# Patient Record
Sex: Female | Born: 1955 | Race: White | Hispanic: No | Smoking: Former smoker
Health system: Southern US, Community
[De-identification: ages and names within clinical notes are randomized; demographics above are authoritative.]

## PROBLEM LIST (undated history)

## (undated) DIAGNOSIS — R778 Other specified abnormalities of plasma proteins: Secondary | ICD-10-CM

## (undated) DIAGNOSIS — Z8619 Personal history of other infectious and parasitic diseases: Secondary | ICD-10-CM

## (undated) DIAGNOSIS — F17200 Nicotine dependence, unspecified, uncomplicated: Secondary | ICD-10-CM

## (undated) DIAGNOSIS — J45909 Unspecified asthma, uncomplicated: Secondary | ICD-10-CM

## (undated) DIAGNOSIS — F329 Major depressive disorder, single episode, unspecified: Secondary | ICD-10-CM

## (undated) DIAGNOSIS — T4145XA Adverse effect of unspecified anesthetic, initial encounter: Secondary | ICD-10-CM

## (undated) DIAGNOSIS — L03119 Cellulitis of unspecified part of limb: Secondary | ICD-10-CM

## (undated) DIAGNOSIS — I447 Left bundle-branch block, unspecified: Secondary | ICD-10-CM

## (undated) DIAGNOSIS — F32A Depression, unspecified: Secondary | ICD-10-CM

## (undated) DIAGNOSIS — M1990 Unspecified osteoarthritis, unspecified site: Secondary | ICD-10-CM

## (undated) DIAGNOSIS — F419 Anxiety disorder, unspecified: Secondary | ICD-10-CM

## (undated) DIAGNOSIS — R7989 Other specified abnormal findings of blood chemistry: Secondary | ICD-10-CM

## (undated) DIAGNOSIS — E079 Disorder of thyroid, unspecified: Secondary | ICD-10-CM

## (undated) DIAGNOSIS — M419 Scoliosis, unspecified: Secondary | ICD-10-CM

## (undated) DIAGNOSIS — Z87898 Personal history of other specified conditions: Secondary | ICD-10-CM

## (undated) DIAGNOSIS — T8859XA Other complications of anesthesia, initial encounter: Secondary | ICD-10-CM

## (undated) DIAGNOSIS — K589 Irritable bowel syndrome without diarrhea: Secondary | ICD-10-CM

## (undated) DIAGNOSIS — E0590 Thyrotoxicosis, unspecified without thyrotoxic crisis or storm: Secondary | ICD-10-CM

## (undated) DIAGNOSIS — O99280 Endocrine, nutritional and metabolic diseases complicating pregnancy, unspecified trimester: Secondary | ICD-10-CM

## (undated) HISTORY — PX: TUBAL LIGATION: SHX77

## (undated) HISTORY — DX: Nicotine dependence, unspecified, uncomplicated: F17.200

## (undated) HISTORY — DX: Unspecified asthma, uncomplicated: J45.909

## (undated) HISTORY — DX: Personal history of other infectious and parasitic diseases: Z86.19

## (undated) HISTORY — PX: OTHER SURGICAL HISTORY: SHX169

## (undated) HISTORY — DX: Depression, unspecified: F32.A

## (undated) HISTORY — DX: Unspecified osteoarthritis, unspecified site: M19.90

## (undated) HISTORY — DX: Other specified abnormal findings of blood chemistry: R79.89

## (undated) HISTORY — DX: Disorder of thyroid, unspecified: E07.9

## (undated) HISTORY — DX: Personal history of other specified conditions: Z87.898

## (undated) HISTORY — PX: WISDOM TOOTH EXTRACTION: SHX21

## (undated) HISTORY — DX: Left bundle-branch block, unspecified: I44.7

## (undated) HISTORY — DX: Anxiety disorder, unspecified: F41.9

## (undated) HISTORY — PX: KNEE SURGERY: SHX244

## (undated) HISTORY — DX: Major depressive disorder, single episode, unspecified: F32.9

## (undated) HISTORY — DX: Other specified abnormalities of plasma proteins: R77.8

## (undated) HISTORY — DX: Endocrine, nutritional and metabolic diseases complicating pregnancy, unspecified trimester: O99.280

---
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
## 1977-05-09 HISTORY — PX: BREAST BIOPSY: SHX20

## 1978-05-09 HISTORY — PX: BREAST BIOPSY: SHX20

## 1998-09-18 ENCOUNTER — Other Ambulatory Visit: Admission: RE | Admit: 1998-09-18 | Discharge: 1998-09-18 | Payer: Self-pay | Admitting: Internal Medicine

## 1998-10-06 ENCOUNTER — Ambulatory Visit (HOSPITAL_COMMUNITY): Admission: RE | Admit: 1998-10-06 | Discharge: 1998-10-06 | Payer: Self-pay | Admitting: Internal Medicine

## 1998-10-06 ENCOUNTER — Encounter: Payer: Self-pay | Admitting: Internal Medicine

## 1999-09-08 ENCOUNTER — Ambulatory Visit (HOSPITAL_BASED_OUTPATIENT_CLINIC_OR_DEPARTMENT_OTHER): Admission: RE | Admit: 1999-09-08 | Discharge: 1999-09-08 | Payer: Self-pay | Admitting: Orthopedic Surgery

## 1999-09-27 ENCOUNTER — Other Ambulatory Visit: Admission: RE | Admit: 1999-09-27 | Discharge: 1999-09-27 | Payer: Self-pay | Admitting: Internal Medicine

## 2000-09-26 ENCOUNTER — Other Ambulatory Visit: Admission: RE | Admit: 2000-09-26 | Discharge: 2000-09-26 | Payer: Self-pay | Admitting: Internal Medicine

## 2003-04-18 ENCOUNTER — Other Ambulatory Visit: Admission: RE | Admit: 2003-04-18 | Discharge: 2003-04-18 | Payer: Self-pay | Admitting: Internal Medicine

## 2005-01-25 ENCOUNTER — Ambulatory Visit: Payer: Self-pay | Admitting: Family Medicine

## 2006-06-14 ENCOUNTER — Ambulatory Visit: Payer: Self-pay | Admitting: Internal Medicine

## 2006-07-06 DIAGNOSIS — F17200 Nicotine dependence, unspecified, uncomplicated: Secondary | ICD-10-CM | POA: Insufficient documentation

## 2006-07-06 DIAGNOSIS — F339 Major depressive disorder, recurrent, unspecified: Secondary | ICD-10-CM | POA: Insufficient documentation

## 2006-07-06 DIAGNOSIS — M2550 Pain in unspecified joint: Secondary | ICD-10-CM | POA: Insufficient documentation

## 2006-07-06 HISTORY — DX: Nicotine dependence, unspecified, uncomplicated: F17.200

## 2006-11-08 ENCOUNTER — Ambulatory Visit: Payer: Self-pay | Admitting: Family Medicine

## 2006-11-08 ENCOUNTER — Telehealth: Payer: Self-pay | Admitting: *Deleted

## 2008-02-07 ENCOUNTER — Telehealth (INDEPENDENT_AMBULATORY_CARE_PROVIDER_SITE_OTHER): Payer: Self-pay | Admitting: Family Medicine

## 2008-03-05 ENCOUNTER — Telehealth (INDEPENDENT_AMBULATORY_CARE_PROVIDER_SITE_OTHER): Payer: Self-pay | Admitting: Family Medicine

## 2008-03-17 ENCOUNTER — Telehealth (INDEPENDENT_AMBULATORY_CARE_PROVIDER_SITE_OTHER): Payer: Self-pay | Admitting: Family Medicine

## 2008-04-24 ENCOUNTER — Telehealth (INDEPENDENT_AMBULATORY_CARE_PROVIDER_SITE_OTHER): Payer: Self-pay | Admitting: Family Medicine

## 2008-06-06 ENCOUNTER — Telehealth: Payer: Self-pay | Admitting: *Deleted

## 2008-06-18 ENCOUNTER — Ambulatory Visit: Payer: Self-pay | Admitting: Family Medicine

## 2008-06-18 DIAGNOSIS — F4321 Adjustment disorder with depressed mood: Secondary | ICD-10-CM | POA: Insufficient documentation

## 2008-06-18 DIAGNOSIS — F411 Generalized anxiety disorder: Secondary | ICD-10-CM | POA: Insufficient documentation

## 2008-12-09 ENCOUNTER — Encounter: Payer: Self-pay | Admitting: Family Medicine

## 2010-06-09 ENCOUNTER — Encounter: Payer: Self-pay | Admitting: *Deleted

## 2010-06-29 ENCOUNTER — Telehealth: Payer: Self-pay | Admitting: Internal Medicine

## 2010-06-29 NOTE — Telephone Encounter (Signed)
Ok to do this  But no until April if possible because of epic transition

## 2010-06-29 NOTE — Telephone Encounter (Signed)
I rcvd a call from Valerie Love.  She said that she used to be a pt of yours 3 yrs ago and would like to re-est with you as pcp. Pt has [facility].  Pt is req to come in for cpx. Pls advise. [phone].

## 2010-06-30 NOTE — Telephone Encounter (Signed)
I called pt and sch her for re-est 30 min office visit with Dr Fabian Sharp on 08/18/10 at 11am. Pt coming in fasting for cpx.

## 2010-08-18 ENCOUNTER — Ambulatory Visit (INDEPENDENT_AMBULATORY_CARE_PROVIDER_SITE_OTHER): Payer: BLUE CROSS/BLUE SHIELD | Admitting: Internal Medicine

## 2010-08-18 ENCOUNTER — Encounter: Payer: Self-pay | Admitting: Internal Medicine

## 2010-08-18 ENCOUNTER — Other Ambulatory Visit (HOSPITAL_COMMUNITY)
Admission: RE | Admit: 2010-08-18 | Discharge: 2010-08-18 | Disposition: A | Discharge status: Home / Self Care | Payer: BC Managed Care – PPO | Source: Ambulatory Visit | Attending: Internal Medicine | Admitting: Internal Medicine

## 2010-08-18 VITALS — BP 100/70 | HR 72 | Ht 61.75 in | Wt 140.0 lb

## 2010-08-18 DIAGNOSIS — M1990 Unspecified osteoarthritis, unspecified site: Secondary | ICD-10-CM

## 2010-08-18 DIAGNOSIS — Z01419 Encounter for gynecological examination (general) (routine) without abnormal findings: Secondary | ICD-10-CM

## 2010-08-18 DIAGNOSIS — R42 Dizziness and giddiness: Secondary | ICD-10-CM

## 2010-08-18 DIAGNOSIS — N760 Acute vaginitis: Secondary | ICD-10-CM

## 2010-08-18 DIAGNOSIS — R002 Palpitations: Secondary | ICD-10-CM

## 2010-08-18 DIAGNOSIS — M129 Arthropathy, unspecified: Secondary | ICD-10-CM

## 2010-08-18 DIAGNOSIS — Z113 Encounter for screening for infections with a predominantly sexual mode of transmission: Secondary | ICD-10-CM | POA: Insufficient documentation

## 2010-08-18 DIAGNOSIS — R9431 Abnormal electrocardiogram [ECG] [EKG]: Secondary | ICD-10-CM

## 2010-08-18 DIAGNOSIS — F4321 Adjustment disorder with depressed mood: Secondary | ICD-10-CM

## 2010-08-18 DIAGNOSIS — Z23 Encounter for immunization: Secondary | ICD-10-CM

## 2010-08-18 DIAGNOSIS — F17200 Nicotine dependence, unspecified, uncomplicated: Secondary | ICD-10-CM

## 2010-08-18 DIAGNOSIS — F339 Major depressive disorder, recurrent, unspecified: Secondary | ICD-10-CM

## 2010-08-18 DIAGNOSIS — Z0000 Encounter for general adult medical examination without abnormal findings: Secondary | ICD-10-CM

## 2010-08-18 LAB — CBC WITH DIFFERENTIAL/PLATELET
Basophils Absolute: 0 10*3/uL (ref 0.0–0.1)
Basophils Relative: 0.4 % (ref 0.0–3.0)
Eosinophils Absolute: 0.1 10*3/uL (ref 0.0–0.7)
Eosinophils Relative: 2.2 % (ref 0.0–5.0)
HCT: 41.5 % (ref 36.0–46.0)
Hemoglobin: 14 g/dL (ref 12.0–15.0)
Lymphocytes Relative: 28.7 % (ref 12.0–46.0)
Lymphs Abs: 1.8 10*3/uL (ref 0.7–4.0)
MCHC: 33.7 g/dL (ref 30.0–36.0)
MCV: 91.2 fl (ref 78.0–100.0)
Monocytes Absolute: 0.4 10*3/uL (ref 0.1–1.0)
Monocytes Relative: 6.9 % (ref 3.0–12.0)
Neutro Abs: 3.9 10*3/uL (ref 1.4–7.7)
Neutrophils Relative %: 61.8 % (ref 43.0–77.0)
Platelets: 258 10*3/uL (ref 150.0–400.0)
RBC: 4.55 Mil/uL (ref 3.87–5.11)
RDW: 13.6 % (ref 11.5–14.6)
WBC: 6.3 10*3/uL (ref 4.5–10.5)

## 2010-08-18 LAB — LIPID PANEL
Cholesterol: 184 mg/dL (ref 0–200)
HDL: 69.1 mg/dL (ref >39.00)
LDL Cholesterol: 108 mg/dL — ABNORMAL HIGH (ref 0–99)
Total CHOL/HDL Ratio: 3
Triglycerides: 37 mg/dL (ref 0.0–149.0)
VLDL: 7.4 mg/dL (ref 0.0–40.0)

## 2010-08-18 LAB — BASIC METABOLIC PANEL
BUN: 22 mg/dL (ref 6–23)
CO2: 29 mEq/L (ref 19–32)
Calcium: 9 mg/dL (ref 8.4–10.5)
Chloride: 107 mEq/L (ref 96–112)
Creatinine, Ser: 0.7 mg/dL (ref 0.4–1.2)
GFR: 97.11 mL/min (ref >60.00)
Glucose, Bld: 75 mg/dL (ref 70–99)
Potassium: 4.4 mEq/L (ref 3.5–5.1)
Sodium: 143 mEq/L (ref 135–145)

## 2010-08-18 LAB — POCT URINALYSIS DIPSTICK
Bilirubin, UA: NEGATIVE
Glucose, UA: NEGATIVE
Ketones, UA: NEGATIVE
Leukocytes, UA: NEGATIVE
Spec Grav, UA: 1.02

## 2010-08-18 LAB — HEPATIC FUNCTION PANEL
ALT: 16 U/L (ref 0–35)
Bilirubin, Direct: 0.1 mg/dL (ref 0.0–0.3)
Total Bilirubin: 0.8 mg/dL (ref 0.3–1.2)

## 2010-08-18 LAB — TSH: TSH: 0.53 u[IU]/mL (ref 0.35–5.50)

## 2010-08-18 LAB — SEDIMENTATION RATE: Sed Rate: 9 mm/hr (ref 0–22)

## 2010-08-18 LAB — T4, FREE: Free T4: 0.95 ng/dL (ref 0.60–1.60)

## 2010-08-18 LAB — T3, FREE: T3, Free: 2.8 pg/mL (ref 2.3–4.2)

## 2010-08-18 NOTE — Patient Instructions (Signed)
Someone will notify you about cardiology appointments we should do an echocardiogram and a appointment with the cardiologist about her abnormal EKG and racing heart. We will notify you about your lab reports. We should have you come back in about a month and overall your labs  and discuss all the other issues we talked about. In the meantime I would rejoin the hospice support groups and call if needed. You need to get a mammogram We can refer you for colonoscopy.

## 2010-08-18 NOTE — Progress Notes (Signed)
Subjective:    Patient ID: Valerie Love, female    DOB: [birth year], 55 y.o.   MRN: [id number]  HPI Patient comes in today to reestablish his last seen in 10/12/02. Hasn't seen a doctor since her husband died in 2007-10-12 from chronic cirrhosis and hepatitis C. She hasn't had insurance until recently .    She has a number of complaints today but would like to have a Pap smear done. She is postmenopausal. She has problems with episodic dizziness with racing heart but no syncope chest pain or shortness of breath she has some mood issues but is doing as best she can she has some arthritis and joint pains for which he takes Advil Aleve and allergies for which she takes over-the-counter Benadryl or other. She would like her thyroid checked she had a past history of thyroid dysfunction when she was pregnant. She is now only smoking less than half a pack per day but continues to smoke. No history of falling.  Review of Systems Allergic rhinitis  Headaches doesn't sleep well  Has some depressive sx since death of her husband  In group hospice . Not on meds was given valium in the past no meds  Or interventions currently not suicialal or hopeless.  Knees and hands hurt no injury. Is post menopausal.  No nvd blood on hemor when she wipes . Never had a colonoscopy.  Had evaluation with ekg in the past  For fast heart rate and was normal. . Tested neg Hep C  Screen hx of jaundice as a child.  Vaginal itching without dc  Used 1 day otc monistat and comes back. No exposures . Neg hearing vision changes except glasses for reading.  Rest of 14 system review as per hpi or negative     Objective:   Physical Exam    Physical Exam: Vital signs reviewed BJY:NWGN is a well-developed well-nourished alert cooperative  white female who appears her stated age in no acute distress.  HEENT: normocephalic  traumatic , Eyes: PERRL EOM's full, conjunctiva clear, Nares: paten,t no deformity discharge or tenderness., Ears: no deformity  EAC's clear TMs with normal landmarks. Mouth: clear OP, no lesions, edema.  Moist mucous membranes. Dentition in adequate repair. NECK: supple without masses, thyromegaly or bruits. CHEST/PULM:  Clear to auscultation and percussion breath sounds equal no wheeze , rales or rhonchi. No chest wall deformities or tenderness. Breast: normal by inspection . No dimpling, discharge, masses, tenderness or discharge . LN: no cervical axillary inguinal adenopathy. CV: PMI is nondisplaced, S1 S2 no gallops, murmurs, rubs. Peripheral pulses are full without delay.No JVD .  ABDOMEN: Bowel sounds normal nontender  No guard or rebound, no hepato splenomegal no CVA tenderness.  No hernia. Extremtities:  No clubbing cyanosis or edema, no acute joint swelling or redness no focal atrophy. There are some oa changes in her hands  But no redness  NEURO:  Oriented x3, cranial nerves 3-12 appear to be intact, no obvious focal weakness,gait within normal limits no abnormal reflexes or asymmetrical   Oriented x 3 and no noted deficits in memory, attention, and speech.  SKIN: No acute rashes normal turgor, color, no bruising or petechiae. PSYCH: Oriented, good eye contact, no obvious depression anxiety, cognition and judgment appear normal. Mildly down articulate  and good eye contact  Pelvic: NL ext GU, labia red with faded spotty redness no ulcers or warts  . INternal Vagina no lesions  Minimal discharge.Cervix: clear  UTERUS: Neg CMT Adnexa:  clear  no masses . PAP done  EKG  LBBB sinus rhythm.    Assessment & Plan:  Preventive Health Care Out of the [facility] system for years  And need to get her parameters ut including mammo gram and colonoscopy.  GYNE exam normal today pap done. Palpitations   Sounds like tachy sx.  Dizzy  Hx of thyroid prob in pregnancy  Recheck levelstoday. Abnormal ekg  Will need echo and Cardiology consults  Arthritis  Appears to be OA type by exam and hx  Smoker  Less but continues  Needs to stop    MOOD: appear to be reactive   Would avoid meds for now until cardiac pix evaluated and will discuss at fu.  Vaginitis  Check for yeast  Can do 7 day rx and call may add diflucan depending on results  Also avoid strong  irritants for now.

## 2010-08-19 ENCOUNTER — Encounter: Payer: Self-pay | Admitting: Internal Medicine

## 2010-08-19 DIAGNOSIS — Z0000 Encounter for general adult medical examination without abnormal findings: Secondary | ICD-10-CM | POA: Insufficient documentation

## 2010-08-19 DIAGNOSIS — Z01419 Encounter for gynecological examination (general) (routine) without abnormal findings: Secondary | ICD-10-CM | POA: Insufficient documentation

## 2010-08-19 DIAGNOSIS — N760 Acute vaginitis: Secondary | ICD-10-CM | POA: Insufficient documentation

## 2010-08-19 DIAGNOSIS — R9431 Abnormal electrocardiogram [ECG] [EKG]: Secondary | ICD-10-CM | POA: Insufficient documentation

## 2010-08-19 LAB — CYCLIC CITRUL PEPTIDE ANTIBODY, IGG: Cyclic Citrullin Peptide Ab: <2 U/mL (ref 0.0–5.0)

## 2010-08-19 LAB — ANA: Anti Nuclear Antibody(ANA): NEGATIVE

## 2010-08-19 NOTE — Assessment & Plan Note (Signed)
lbbb  Needs eval  Poss associated with palpitations and dizziness . Will plan for echo and cards consult

## 2010-08-25 ENCOUNTER — Encounter: Payer: Self-pay | Admitting: *Deleted

## 2010-08-27 ENCOUNTER — Encounter: Payer: Self-pay | Admitting: Internal Medicine

## 2010-09-01 ENCOUNTER — Other Ambulatory Visit (HOSPITAL_COMMUNITY): Payer: Self-pay | Admitting: Internal Medicine

## 2010-09-01 DIAGNOSIS — R9431 Abnormal electrocardiogram [ECG] [EKG]: Secondary | ICD-10-CM

## 2010-09-01 DIAGNOSIS — R000 Tachycardia, unspecified: Secondary | ICD-10-CM

## 2010-09-01 DIAGNOSIS — R42 Dizziness and giddiness: Secondary | ICD-10-CM

## 2010-09-01 DIAGNOSIS — R55 Syncope and collapse: Secondary | ICD-10-CM

## 2010-09-06 ENCOUNTER — Telehealth: Payer: Self-pay | Admitting: *Deleted

## 2010-09-06 MED ORDER — METRONIDAZOLE 500 MG PO TABS: 500.0000 mg | ORAL_TABLET | Freq: Two times a day (BID) | ORAL | Status: AC

## 2010-09-06 NOTE — Telephone Encounter (Signed)
Message copied by Tor Netters on Mon Sep 06, 2010 12:43 PM ------      Message from: [facility], [place] K      Created: Fri Sep 03, 2010  5:38 PM       Tell patient that her pap  Is neg for cancer.   There was however some yeast and bacteria consistent with Bacterial Vaginosis.            If she is still having any vaginal sx then recommend she can try OTC monistat for 3-7 days   For yeast and Flagyl 500 bid for 7 days for the BV.

## 2010-09-06 NOTE — Telephone Encounter (Signed)
Left message for pt to call back  °

## 2010-09-06 NOTE — Telephone Encounter (Signed)
Pt aware of results and rx sent electronically

## 2010-09-08 ENCOUNTER — Encounter: Payer: Self-pay | Admitting: Cardiology

## 2010-09-08 ENCOUNTER — Ambulatory Visit (INDEPENDENT_AMBULATORY_CARE_PROVIDER_SITE_OTHER): Payer: BC Managed Care – PPO | Admitting: Cardiology

## 2010-09-08 ENCOUNTER — Ambulatory Visit (HOSPITAL_COMMUNITY): Payer: BC Managed Care – PPO | Attending: Internal Medicine | Admitting: Radiology

## 2010-09-08 DIAGNOSIS — R9431 Abnormal electrocardiogram [ECG] [EKG]: Secondary | ICD-10-CM

## 2010-09-08 DIAGNOSIS — F17200 Nicotine dependence, unspecified, uncomplicated: Secondary | ICD-10-CM

## 2010-09-08 DIAGNOSIS — R42 Dizziness and giddiness: Secondary | ICD-10-CM

## 2010-09-08 DIAGNOSIS — R000 Tachycardia, unspecified: Secondary | ICD-10-CM

## 2010-09-08 DIAGNOSIS — R002 Palpitations: Secondary | ICD-10-CM

## 2010-09-08 DIAGNOSIS — R011 Cardiac murmur, unspecified: Secondary | ICD-10-CM | POA: Insufficient documentation

## 2010-09-08 DIAGNOSIS — I447 Left bundle-branch block, unspecified: Secondary | ICD-10-CM

## 2010-09-08 NOTE — Assessment & Plan Note (Signed)
Patient counseled on discontinuing. 

## 2010-09-08 NOTE — Assessment & Plan Note (Signed)
Symptoms are short-lived but recurrent. We discussed the options today. We will follow this and if they persist or worsen we will schedule a CardioNet monitor.

## 2010-09-08 NOTE — Patient Instructions (Signed)
Your physician recommends that you schedule a follow-up appointment in: 4-6 weeks with Dr. Jens Som Your physician has requested that you have a lexiscan myoview. For further information please visit [email or web address] Please follow instruction sheet, as given.

## 2010-09-08 NOTE — Assessment & Plan Note (Signed)
Patient noted to have a left bundle branch block on electrocardiogram. Proceed with Myoview to exclude ischemia.

## 2010-09-08 NOTE — Progress Notes (Signed)
HPI: 55 year old female with no prior cardiac history for evaluation of palpitations and abnormal electrocardiogram. Patient has had palpitations for years. They are sudden onset and described as a flutter. She has been more at night. They resolve spontaneously after one to 2 seconds. There is no associated presyncope or syncope. There is no associated chest pain. She also notices dizziness when going from a bending position to upright. She also notices this coming down a ladder. It is not associated with her palpitations. She has dyspnea with more extreme activities but not with routine activities. There is no orthopnea, PND, pedal edema or chest pain. She was recently also noted to have a left bundle branch block on electrocardiogram. Because of the above we were asked to further evaluate.  Current Outpatient Prescriptions  Medication Sig Dispense Refill  . metroNIDAZOLE (FLAGYL) 500 MG tablet Take 1 tablet (500 mg total) by mouth 2 (two) times daily.  14 tablet  0  . Naproxen Sodium (ALEVE) 220 MG CAPS Take by mouth as directed.       . NON FORMULARY Sudafed prn          Allergies  Allergen Reactions  . Codeine Itching, Swelling and Rash    Past Medical History  Diagnosis Date  . Arthritis   . Thyroid disease in pregnancy   . Childhood asthma   . Hx of varicella   . History of jaundice     as a child    . Depression     related to husband illness and death    Past Surgical History  Procedure Date  . Tubal ligation   . Breast biopsy 80  . Tonsillectomy and adenoidectomy 1073  . Knee surgery     right duda   . Cyst removed from ovary     History   Social History  . Marital Status: Married    Spouse Name: N/A    Number of Children: 3  . Years of Education: N/A   Occupational History  .      Catering and cleaning   Social History Main Topics  . Smoking status: Current Everyday Smoker -- 0.5 packs/day for 40 years  . Smokeless tobacco: Not on file  . Alcohol Use: Yes      socially  . Drug Use: No  . Sexually Active: Not on file   Other Topics Concern  . Not on file   Social History Narrative   Husband passed away on 02-16-08 hep c and cirrhosis.   She is hep c negative [id number] of 2 son no pets No falls .  Has smoke detector and wears seat belts. POsitive  firearms. No excess sun exposure. 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 Still smokes some. Daily. ocass etoh.  No rec drugs.Daughter patient in our practice   [name]    Family History  Problem Relation Age of Onset  . Alcohol abuse Mother     died from stomach ulcers  . Stroke Father     died  from cva and mi  . Hypertension      parent  . Hyperlipidemia      parent  . Coronary artery disease Father     Age 98    ROS: Leg Cramps and Fatigue but no fevers or chills, productive cough, hemoptysis, dysphasia, odynophagia, melena, hematochezia, dysuria, hematuria, rash, seizure activity, orthopnea, PND, pedal edema, claudication. Remaining systems are negative.  Physical Exam: General:  Well developed/well nourished in NAD Skin warm/dry Patient not depressed No peripheral clubbing Back-normal HEENT-normal/normal eyelids Neck supple/normal carotid upstroke bilaterally; no bruits; no JVD; no thyromegaly chest - CTA/ normal expansion CV - RRR/normal S1 and S2; no rubs or gallops;  PMI nondisplaced; 2/6 systolic murmur at the apex. Abdomen -NT/ND, no HSM, no mass, + bowel sounds, no bruit 2+ femoral pulses, no bruits Ext-no edema, chords, 2+ DP Neuro-grossly nonfocal  ECG 08/18/10 Sinus bradycardia with left bundle branch block.

## 2010-09-08 NOTE — Assessment & Plan Note (Signed)
MR murmur on examination. Schedule echocardiogram.

## 2010-09-15 ENCOUNTER — Ambulatory Visit (HOSPITAL_COMMUNITY): Payer: BC Managed Care – PPO | Attending: Cardiology | Admitting: Radiology

## 2010-09-15 DIAGNOSIS — R0602 Shortness of breath: Secondary | ICD-10-CM

## 2010-09-15 DIAGNOSIS — R079 Chest pain, unspecified: Secondary | ICD-10-CM

## 2010-09-15 DIAGNOSIS — I447 Left bundle-branch block, unspecified: Secondary | ICD-10-CM | POA: Insufficient documentation

## 2010-09-15 DIAGNOSIS — R002 Palpitations: Secondary | ICD-10-CM | POA: Insufficient documentation

## 2010-09-15 MED ORDER — TECHNETIUM TC 99M TETROFOSMIN IV KIT
11.0000 | PACK | Freq: Once | INTRAVENOUS | Status: AC | PRN
  Administered 2010-09-15: 11 via INTRAVENOUS

## 2010-09-15 MED ORDER — TECHNETIUM TC 99M TETROFOSMIN IV KIT
33.0000 | PACK | Freq: Once | INTRAVENOUS | Status: AC | PRN
  Administered 2010-09-15: 33 via INTRAVENOUS

## 2010-09-15 MED ORDER — ADENOSINE (DIAGNOSTIC) 3 MG/ML IV SOLN
0.5600 mg/kg | Freq: Once | INTRAVENOUS | Status: AC
  Administered 2010-09-15: 34.8 mg via INTRAVENOUS

## 2010-09-15 NOTE — Progress Notes (Signed)
[facility] SITE 3 NUCLEAR MED [address] [place] [address] [phone]  Cardiology Nuclear Med Study  Valerie Love is a 55 y.o. female [id number] May 11, 1955   Nuclear Med Background Indication for Stress Test:  Evaluation for Ischemia and Abnormal EKG History: 05/12 Echo: EF 50% Cardiac Risk Factors: Family History - CAD, LBBB and Smoker  Symptoms:  Dizziness, DOE and Palpitations   Nuclear Pre-Procedure Caffeine/Decaff Intake:  None NPO After: 10:00pm   Lungs:  clear IV 0.9% NS with Angio Cath:  22g  IV Site: R Antecubital  IV Started by:  Irean Hong, RN  Chest Size (in):  36 Cup Size: C  Height: 5\' 1"  (1.549 m)  Weight:  137 lb (62.143 kg)  BMI:  Body mass index is 25.89 kg/(m^2). Tech Comments:  N/A    Nuclear Med Study 1 or 2 day study: 1 day  Stress Test Type:  Adenosine  Reading MD: Marca Ancona, MD  Order Authorizing Provider:  B.Crenshaw  Resting Radionuclide: Technetium 55m Tetrofosmin  Resting Radionuclide Dose: 11.0 mCi   Stress Radionuclide:  Technetium 19m Tetrofosmin  Stress Radionuclide Dose: 33.0 mCi           Stress Protocol Rest HR: 60 Stress HR: 107  Rest BP: 104/68 Stress BP: 133/81  Exercise Time (min): n/a METS: n/a   Predicted Max HR: 165 bpm % Max HR: 64.85 bpm Rate Pressure Product: [id number]   Dose of Adenosine (mg):  34.9 Dose of Lexiscan: n/a mg  Dose of Atropine (mg): n/a Dose of Dobutamine: n/a mcg/kg/min (at max HR)  Stress Test Technologist: Milana Na, EMT-P  Nuclear Technologist:  Domenic Polite, CNMT     Rest Procedure:  Myocardial perfusion imaging was performed at rest 45 minutes following the intravenous administration of Technetium 47m Tetrofosmin. Rest ECG: NSR-LBBB  Stress Procedure:  The patient received IV adenosine at 140 mcg/kg/min for 4 minutes.  There were no significant changes and pvcs with infusion.  Technetium 9m Tetrofosmin was injected at the 2 minute mark and quantitative  spect images were obtained after a 45 minute delay. Stress ECG: No significant change from baseline ECG  QPS Raw Data Images:  Normal; no motion artifact; normal heart/lung ratio. Stress Images:  Mildly decreased counts in the septal wall.  Rest Images:  Mildly decreased counts in the septal wall.  Subtraction (SDS):  Fixed mild perfusion defect in the septal wall.  Transient Ischemic Dilatation (Normal <1.22):  1.18 Lung/Heart Ratio (Normal <0.45):  0.28  Quantitative Gated Spect Images QGS EDV:  128 ml QGS ESV:  66 ml QGS cine images:  Septal-lateral dyssynchrony QGS EF: 48%  Impression Exercise Capacity:  Adenosine study with no exercise. BP Response:  Normal Clinical Symptoms:  Neck and chest tightness.  ECG Impression:  LBBB, no change. Comparison with Prior Nuclear Study: No images to compare  Overall Impression:  Low risk stress nuclear study.  There is a mild, fixed septal perfusion defect that I suspect is secondary to LBBB.  No ischemia.  Dyssynchronous septal and lateral walls.   Reinhart Saulters [employer]

## 2010-09-16 NOTE — Progress Notes (Signed)
Copy routed to Dr. Crenshaw.Falecha Clark ° °

## 2010-09-17 ENCOUNTER — Ambulatory Visit: Payer: BLUE CROSS/BLUE SHIELD | Admitting: Internal Medicine

## 2010-09-17 NOTE — Progress Notes (Signed)
pt aware of results Valerie Love  

## 2010-09-24 ENCOUNTER — Encounter: Payer: Self-pay | Admitting: Internal Medicine

## 2010-09-24 ENCOUNTER — Ambulatory Visit (INDEPENDENT_AMBULATORY_CARE_PROVIDER_SITE_OTHER): Payer: BC Managed Care – PPO | Admitting: Internal Medicine

## 2010-09-24 VITALS — BP 120/80 | HR 77 | Wt 139.0 lb

## 2010-09-24 DIAGNOSIS — F411 Generalized anxiety disorder: Secondary | ICD-10-CM

## 2010-09-24 DIAGNOSIS — M1990 Unspecified osteoarthritis, unspecified site: Secondary | ICD-10-CM

## 2010-09-24 DIAGNOSIS — M129 Arthropathy, unspecified: Secondary | ICD-10-CM

## 2010-09-24 DIAGNOSIS — I447 Left bundle-branch block, unspecified: Secondary | ICD-10-CM

## 2010-09-24 DIAGNOSIS — R42 Dizziness and giddiness: Secondary | ICD-10-CM

## 2010-09-24 DIAGNOSIS — F17200 Nicotine dependence, unspecified, uncomplicated: Secondary | ICD-10-CM

## 2010-09-24 MED ORDER — ALPRAZOLAM 0.25 MG PO TABS: 0.2500 mg | ORAL_TABLET | Freq: Three times a day (TID) | ORAL | Status: DC | PRN

## 2010-09-24 NOTE — Patient Instructions (Signed)
Can  Add   Antianxiety agent  As needed.  Will  Get the ok from Dr Jens Som for adding controller med such as lexapro  To help with the mood.   Then will plan rov after 3 weeks or so on the medication. Continue lifestyle intervention healthy eating and exercise .   And stop tobacco.

## 2010-09-24 NOTE — Progress Notes (Signed)
  Subjective:    Patient ID: Valerie Love, female    DOB: 05/11/1955, 55 y.o.   MRN: [id number]  HPI Patient comes in for followup of a number of issues. Since her last visit she has seen cardiology although does not know her Holter results yet. She had a chemical stress test and an echocardiogram. Since that time she has Has decreased caffiene  In half and tobacco to 5 per day  Thinks some of issues of dizziness and feeling bad could be from anxiety  . She thinks she needs some help in this area.  ? Hx of remote rx with  prozac in the past but cant quite remember  .  Needs help with anxiety depression  Doesn't think more hospice  Help  Would be that helpful.   Working 2 jobs. Review of Systems Currently negative for chest pain vision pain. Taking Aleve once a day for joint aches. No change in meds. No bleeding. Is waiting on the colonoscopies because she will have to pay so much money for that 50%.  Past history family history social history reviewed in the electronic medical record.      Objective:   Physical Exam WDWN in nad Chest:  Clear to A&P without wheezes rales or rhonchi CV:  S1-S2 no gallops or murmurs peripheral perfusion is normal Abdomen:  Sof,t normal bowel sounds without hepatosplenomegaly, no guarding rebound or masses no CVA tenderness reviewed labs  with her     Assessment & Plan:  Dizziness episodes ;some better. Left bundle branch block; under evaluation.  Has made some major life style changes for the better and encouraged to continue Tobacco  Can try nic replacement Arthralgia No evidence of inflammatory arthritis at this time.  Probably osteoarthritis etc. Take care with Aleve once a day Anxiety depression; adding to the mix was good insight  Disc     Add benzo for now and then an ssri  With ok from cards.  Then plan follow up.   Expectant management.And risk benefit of medication discussed.

## 2010-09-24 NOTE — Op Note (Signed)
[place]. [facility]  Patient:    Valerie Love, Valerie Love                         MRN: [id number] Proc. Date: 09/08/99 Adm. Date:  [id number] Disc. Date: [id number] Attending:  Aldean Baker V                           Operative Report  PREOPERATIVE DIAGNOSIS:  Right knee lateral meniscal tear with synovial cyst.  POSTOPERATIVE DIAGNOSIS:  Right knee lateral meniscal tear with synovial cyst and osteochondral defect of the lateral femoral condyle.  PROCEDURE:  Right knee arthroscopy with debridement of synovial cyst, debridement of partial lateral meniscal tear, and debridement of lateral femoral condyle osteochondral defect.  SURGEON:  [name], M.D.  ANESTHESIA:  General endotracheal.  ESTIMATED BLOOD LOSS:  Minimal.  DRAINS:  None.  COMPLICATIONS:  None.  DISPOSITION:  To PACU in stable condition.  INDICATIONS FOR PROCEDURE:  The patient is a 55 year old woman with chronic right knee pain.  She has been having a cystic mass which has been increasing and decreasing in size, and has caused her pain.  Pain with activities of daily living. MRI showed a lateral meniscal tear and a synovial cyst.  After failing conservative care, the patient presents at this time for arthroscopic intervention.  The risks and benefits were discussed including infection, neurovascular injury, persistent pain, persistent swelling, recurrence of the cyst.  The patient states she understands and wishes to proceed at this time.  DESCRIPTION OF PROCEDURE:  The patient was brought to OR #5 and underwent a general anesthetic.  After an adequate level of anesthesia was obtained, the patients right lower extremity was prepped using DuraPrep and draped into a sterile field. The arthroscope was inserted through the inferolateral portion.  Visualization f the patellofemoral joint showed some synovial fold hypertrophy, showed no changes with the patellar or trochlea.   Examination of the medial femoral condyle showed a stable intact medial meniscus.  No osteochondral defects.  Examination of the notch showed a good stable ACL.  Examination of the lateral joint line showed a very small osteochondral defect which was debrided with the shaver from the medial portal.  Examination of the lateral joint line showed a small anterolateral tear of the lateral meniscus which was debrided.  The synovial cyst was also debrided intra-articularly.  Survey was then performed of all the joints.  No evidence of any loose bodies.  No other pathology identified.  The instruments were removed.  The joint was closed using a 2-0 nylon.  The wounds were covered with Adaptic, orthopedic sponges, ABD dressing, sterile Webril, and an Ace wrap from her toes to the thigh.  She was extubated and taken to PACU in stable condition.  Followup in the office in one to two weeks, weightbearing as tolerated with crutches. DD:  09/08/99 TD:  09/10/99 Job: [id number]

## 2010-10-05 ENCOUNTER — Telehealth: Payer: Self-pay | Admitting: *Deleted

## 2010-10-05 MED ORDER — ESCITALOPRAM OXALATE 10 MG PO TABS: ORAL_TABLET | ORAL | Status: DC

## 2010-10-05 NOTE — Telephone Encounter (Signed)
Left message to call back  

## 2010-10-05 NOTE — Telephone Encounter (Signed)
Message copied by Romualdo Bolk on Tue Oct 05, 2010  2:51 PM ------      Message from: [facility], [place] K      Created: Mon Oct 04, 2010  6:57 PM       Tell patient that got the ok from cardiology to try lexapro,            Begin lexapro 10 mg take 5 mg ( 1/2) po qd for 1 week and then increase to 10 mg per day ( whole pill)   Disp 30 refill x 1 .      Then ROV 3 weeks or so after beginning med.             WKpanosh

## 2010-10-05 NOTE — Telephone Encounter (Signed)
Pt aware and rx called in  

## 2010-10-05 NOTE — Telephone Encounter (Signed)
Left message on machine to call back  

## 2010-10-06 ENCOUNTER — Encounter: Payer: Self-pay | Admitting: Cardiology

## 2010-10-06 ENCOUNTER — Ambulatory Visit (INDEPENDENT_AMBULATORY_CARE_PROVIDER_SITE_OTHER): Payer: BC Managed Care – PPO | Admitting: Cardiology

## 2010-10-06 DIAGNOSIS — R002 Palpitations: Secondary | ICD-10-CM

## 2010-10-06 DIAGNOSIS — R9431 Abnormal electrocardiogram [ECG] [EKG]: Secondary | ICD-10-CM

## 2010-10-06 MED ORDER — METOPROLOL SUCCINATE ER 25 MG PO TB24: ORAL_TABLET | ORAL | Status: DC

## 2010-10-06 NOTE — Patient Instructions (Signed)
Your physician wants you to follow-up in: 12 months. You will receive a reminder letter in the mail two months in advance. If you don't receive a letter, please call our office to schedule the follow-up appointment. Your physician has recommended you make the following change in your medication: Start Toprol 12.5 mg by mouth daily.(this will be half of a 25 mg tablet)

## 2010-10-06 NOTE — Progress Notes (Signed)
HPI:55 year old female I saw in early May for evaluation of palpitations and abnormal electrocardiogram. Myoview 5/12 revealed EF 48 and fixed septal defect felt secondary to LBBB. Echocardiogram in May 2012 revealed an ejection fraction of 50%, mild left atrial enlargement, trace mitral and aortic insufficiency. Since I last saw her, she denies dyspnea on exertion, orthopnea, pedal edema, syncope or chest pain. She continues to have occasional palpitations described as a brief flutter. These are not sustained.   Current Outpatient Prescriptions  Medication Sig Dispense Refill  . ALPRAZolam (XANAX) 0.25 MG tablet Take 1 tablet (0.25 mg total) by mouth 3 (three) times daily as needed for anxiety.  30 tablet  0  . escitalopram (LEXAPRO) 10 MG tablet 1/2 tab for 1 week then 1 tab daily after that.  30 tablet  1  . Naproxen Sodium (ALEVE) 220 MG CAPS Take by mouth as directed.          Past Medical History  Diagnosis Date  . Arthritis   . Thyroid disease in pregnancy   . Childhood asthma   . Hx of varicella   . History of jaundice     as a child    . Depression     related to husband illness and death    Past Surgical History  Procedure Date  . Tubal ligation   . Breast biopsy 80  . Tonsillectomy and adenoidectomy 1073  . Knee surgery     right duda   . Cyst removed from ovary     History   Social History  . Marital Status: Married    Spouse Name: N/A    Number of Children: 3  . Years of Education: N/A   Occupational History  .      Catering and cleaning   Social History Main Topics  . Smoking status: Current Everyday Smoker -- 0.5 packs/day for 40 years  . Smokeless tobacco: Not on file  . Alcohol Use: Yes     socially  . Drug Use: No  . Sexually Active: Not on file   Other Topics Concern  . Not on file   Social History Narrative   Husband passed away on 03/11/2008 hep c and cirrhosis.   She is hep c negative 2006G3P3HH of 2 son no pets No falls .  Has smoke  detector and wears seat belts. POsitive  firearms. No excess sun exposure. 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 Still smokes some. Daily. ocass etoh.  No rec drugs.Daughter patient in our practice   [name]    ROS: no fevers or chills, productive cough, hemoptysis, dysphasia, odynophagia, melena, hematochezia, dysuria, hematuria, rash, seizure activity, orthopnea, PND, pedal edema, claudication. Remaining systems are negative.  Physical Exam: Well-developed well-nourished in no acute distress.  Skin is warm and dry.  HEENT is normal.  Neck is supple. No thyromegaly.  Chest is clear to auscultation with normal expansion.  Cardiovascular exam is regular rate and rhythm.  Abdominal exam nontender or distended. No masses palpated. Extremities show no edema. neuro grossly intact

## 2010-10-06 NOTE — Assessment & Plan Note (Signed)
Patient continues to have brief palpitations. I will add Toprol 12.5 mg daily. We discussed a CardioNet but she declined. We will consider this in the future if her symptoms worsen. Note her LV function is low normal to mildly reduced. This may be related to her left bundle branch block. We are adding Toprol as described above.

## 2010-10-06 NOTE — Assessment & Plan Note (Signed)
Myoview negative.

## 2010-10-06 NOTE — Assessment & Plan Note (Signed)
Myoview shows no ischemia. No further workup.

## 2010-10-07 ENCOUNTER — Telehealth: Payer: Self-pay | Admitting: Cardiology

## 2010-10-07 NOTE — Telephone Encounter (Signed)
Pt was given toprolol XL 25mg  yesterday and another provider had gven her some meds prior to that and she feels loopy and was wanted to talk to someone to see if the mixture of the meds and what can she do

## 2010-10-07 NOTE — Telephone Encounter (Signed)
Called patient back and she advised me that she is very groggy from taking Expo,Xanax,and Topel. She states that they were all started around the same time. She is currently not having any palp's. I advised her that she should wait a few days while Expo is being initiated and then  start Topel at one half the dose for a few days and then increase as tolerated. She will try this medication plan.

## 2010-10-08 ENCOUNTER — Telehealth: Payer: Self-pay | Admitting: *Deleted

## 2010-10-08 MED ORDER — ALPRAZOLAM 0.25 MG PO TABS: 0.2500 mg | ORAL_TABLET | Freq: Three times a day (TID) | ORAL | Status: DC | PRN

## 2010-10-08 NOTE — Telephone Encounter (Signed)
Per Dr. Fabian Sharp ok x 1. Rx faxed to pharmacy.

## 2010-10-08 NOTE — Telephone Encounter (Signed)
Pt needs a refill on xanax. Pt only has 6 left.

## 2010-11-04 ENCOUNTER — Encounter: Payer: Self-pay | Admitting: Internal Medicine

## 2010-11-04 ENCOUNTER — Ambulatory Visit (INDEPENDENT_AMBULATORY_CARE_PROVIDER_SITE_OTHER): Payer: BC Managed Care – PPO | Admitting: Internal Medicine

## 2010-11-04 DIAGNOSIS — F411 Generalized anxiety disorder: Secondary | ICD-10-CM

## 2010-11-04 DIAGNOSIS — F4321 Adjustment disorder with depressed mood: Secondary | ICD-10-CM

## 2010-11-04 DIAGNOSIS — F17200 Nicotine dependence, unspecified, uncomplicated: Secondary | ICD-10-CM

## 2010-11-04 MED ORDER — ALPRAZOLAM 0.25 MG PO TABS: 0.2500 mg | ORAL_TABLET | Freq: Three times a day (TID) | ORAL | Status: DC | PRN

## 2010-11-04 MED ORDER — ESCITALOPRAM OXALATE 10 MG PO TABS: 10.0000 mg | ORAL_TABLET | Freq: Every day | ORAL | Status: DC

## 2010-11-04 NOTE — Progress Notes (Signed)
  Subjective:    Patient ID: Valerie Love, female    DOB: June 30, 1955, 55 y.o.   MRN: [id number]  HPI Patient comes in for followup of medical problems and medicine checks. Since her last visit her depression and anxiety is much better on the Lexapro and as needed Xanax. She is currently been on the Lexapro 10 mg about 3 weeks or so. Has noted a better mood less anxiety and only taking the Xanax to half a pill occasionally. Once today.  Initially she took some metoprolol with all of the above and felt tired CNS symptoms. Loopy.  She called cardiology and they recommended she stop the metoprolol until her she was on the other medication. He believes her blood pressure has been good. No new problems.   Review of Systems No fever chest pain shortness of breath neurologic symptoms. She is trying to stop smoking and has been able to decrease this and she is on the medication. Past history family history social history reviewed in the electronic medical record.     Objective:   Physical Exam Well-developed well-nourished in no acute distress looks much more relaxed and not depressed affect is appropriate for situation. Repeat blood pressure right arm 130/80 pulse 60     Assessment & Plan:  Anxiety depression and bereavement Significantly better on Lexapro and as needed Xanax. Expectant management continue same medication as needed and continue on the Lexapro. We'll recheck in 4 months or as needed call for refills. Prefer to stay on medicine at least 9-12 months. Palpitations Okay to try to restart the very low dose metoprolol and if has problems contact Dr. Ludwig Clarks office  Tobacco  discussed and counseled encouraged cessation expectant management to avoid weight gain

## 2010-11-04 NOTE — Patient Instructions (Signed)
CONTINUE SAME DOSAGE   ROV  in  4 months or as needed.

## 2010-11-29 ENCOUNTER — Telehealth: Payer: Self-pay | Admitting: *Deleted

## 2010-11-29 NOTE — Telephone Encounter (Signed)
We can change but celexa 20 is  Similar to 10 mg  Lexapro. So should take the whole pill of celexa 20 if on full equivalent dosing .  Disp 30 with 6 refills ( or 90 with 21 refill)  Call in 1 month about how she is doing.

## 2010-11-29 NOTE — Telephone Encounter (Signed)
Spoke to pt and she is going to lose her ins due to being laid off.  Pt is wanting to know if we can change her medication from Lexapro generic to celexa generic 20mg  and she taken 1/2-1 tab daily this way she is only taking a 1/2 but gets more pills.

## 2010-11-30 MED ORDER — CITALOPRAM HYDROBROMIDE 20 MG PO TABS: 20.0000 mg | ORAL_TABLET | Freq: Every day | ORAL | Status: DC

## 2010-11-30 NOTE — Telephone Encounter (Signed)
Left message to call back  

## 2010-11-30 NOTE — Telephone Encounter (Signed)
Pt aware and rx sent to pharmacy. Pt to call back and let us know how she has done on meds.

## 2010-12-28 ENCOUNTER — Telehealth: Payer: Self-pay | Admitting: *Deleted

## 2010-12-28 NOTE — Telephone Encounter (Signed)
Refill on alprazolam 0.25mg   LOV 11/04/10 NOV 03/11/11

## 2010-12-28 NOTE — Telephone Encounter (Signed)
Ok x 1

## 2010-12-29 MED ORDER — ALPRAZOLAM 0.25 MG PO TABS: 0.2500 mg | ORAL_TABLET | Freq: Three times a day (TID) | ORAL | Status: DC | PRN

## 2010-12-29 NOTE — Telephone Encounter (Signed)
rx sent to pharmacy

## 2011-01-13 ENCOUNTER — Telehealth: Payer: Self-pay | Admitting: *Deleted

## 2011-01-13 NOTE — Telephone Encounter (Signed)
Pt called saying that lexapro is doing okay. She doesn't feel as good on it as the other medication but is willing to try it longer to see how things work.

## 2011-01-13 NOTE — Telephone Encounter (Signed)
I thought  We changed from lexapro to celexa  Please confirm

## 2011-01-14 NOTE — Telephone Encounter (Signed)
It was. Pt just said medication and didn't give a name. I'm removing the lexapro from her med list.

## 2011-02-10 ENCOUNTER — Telehealth: Payer: Self-pay | Admitting: *Deleted

## 2011-02-10 MED ORDER — ALPRAZOLAM 0.25 MG PO TABS: 0.2500 mg | ORAL_TABLET | Freq: Three times a day (TID) | ORAL | Status: DC | PRN

## 2011-02-10 NOTE — Telephone Encounter (Signed)
Refill on alprazolam 0.25mg  last filled 12/29/10 #40

## 2011-02-10 NOTE — Telephone Encounter (Signed)
Script called in

## 2011-02-10 NOTE — Telephone Encounter (Signed)
Call in #30 with no rf  

## 2011-03-09 ENCOUNTER — Telehealth: Payer: Self-pay | Admitting: Internal Medicine

## 2011-03-09 NOTE — Telephone Encounter (Signed)
If her BP readings are good and she has no  Heart lung symptoms and she feels ok on the celexa we can refill the med but  She will need labs and check up  In April 2013 . Ok to refill her med until then if above  Is accurate.  [facility] could [facility] into [facility] or  [employer] . In the meantime

## 2011-03-09 NOTE — Telephone Encounter (Signed)
Pt aware and is doing fine.

## 2011-03-09 NOTE — Telephone Encounter (Signed)
Pt has sch ov for 03-11-11. Pt no longer has insurance would like to cancel ov if doc will give her refills on celexa.

## 2011-03-11 ENCOUNTER — Ambulatory Visit: Payer: BC Managed Care – PPO | Admitting: Internal Medicine

## 2011-03-15 ENCOUNTER — Telehealth: Payer: Self-pay

## 2011-03-15 NOTE — Telephone Encounter (Signed)
Pt states when Dr Fabian Sharp was out of the office Dr Clent Ridges called her anxiety medicaiton in but it was only 1 tablet a day.  Pt states she called the pharmacy back last week to get another refill and needs an ok from the doctor.  Pls advise.

## 2011-03-15 NOTE — Telephone Encounter (Signed)
Please ask how often she is using med . At present to be used prn.   Can refill x 1 in the meantime

## 2011-03-16 MED ORDER — ALPRAZOLAM 0.25 MG PO TABS: 0.2500 mg | ORAL_TABLET | Freq: Three times a day (TID) | ORAL | Status: DC | PRN

## 2011-03-16 NOTE — Telephone Encounter (Signed)
Spoke to pt- she is using it twice a day. Rx called in.

## 2011-04-05 ENCOUNTER — Other Ambulatory Visit: Payer: Self-pay | Admitting: *Deleted

## 2011-04-05 NOTE — Telephone Encounter (Signed)
Refill on xanax 0.25mg  last filled on 03/16/11  LOV 11/04/10 NOV none

## 2011-04-06 MED ORDER — ALPRAZOLAM 0.25 MG PO TABS: 0.2500 mg | ORAL_TABLET | Freq: Three times a day (TID) | ORAL | Status: DC | PRN

## 2011-04-06 NOTE — Telephone Encounter (Signed)
rx called into pharmacy

## 2011-04-18 ENCOUNTER — Telehealth: Payer: Self-pay | Admitting: *Deleted

## 2011-04-18 MED ORDER — FLUCONAZOLE 150 MG PO TABS: ORAL_TABLET | ORAL | Status: DC

## 2011-04-18 NOTE — Telephone Encounter (Signed)
Tried 7 day Monistat otc and then 3 days later it come back. She is having intense itching. Unsure if there is a discharge due to using the creams. Pt is wanting to know what to do next.

## 2011-04-18 NOTE — Telephone Encounter (Signed)
Per Dr. Fabian Sharp- diflucan 150 #2  1 po and can repeat in 3 days Rx sent to pharmacy.

## 2011-05-02 ENCOUNTER — Telehealth: Payer: Self-pay | Admitting: *Deleted

## 2011-05-02 MED ORDER — ALPRAZOLAM 0.25 MG PO TABS: 0.2500 mg | ORAL_TABLET | Freq: Three times a day (TID) | ORAL | Status: DC | PRN

## 2011-05-02 NOTE — Telephone Encounter (Signed)
Per Dr. Panosh- ok x 1. Rx called in. 

## 2011-05-02 NOTE — Telephone Encounter (Signed)
Refill on alprazolam last filled on 04/26/11 #30

## 2011-05-28 ENCOUNTER — Other Ambulatory Visit: Payer: Self-pay | Admitting: Internal Medicine

## 2011-05-30 NOTE — Telephone Encounter (Signed)
Ok to refill x 6 months   After that needs yearly office visit.

## 2011-05-30 NOTE — Telephone Encounter (Signed)
Pt last seen 10/31/10.  Pls advise.

## 2011-06-07 ENCOUNTER — Telehealth: Payer: Self-pay | Admitting: *Deleted

## 2011-06-07 NOTE — Telephone Encounter (Signed)
Refill on xanax 0.25mg  last filled on 05/01/12 LOV 11/04/10 NOV none

## 2011-06-08 MED ORDER — ALPRAZOLAM 0.25 MG PO TABS: 0.2500 mg | ORAL_TABLET | Freq: Three times a day (TID) | ORAL | Status: DC | PRN

## 2011-06-08 NOTE — Telephone Encounter (Signed)
Ok to refill x 1  

## 2011-06-08 NOTE — Telephone Encounter (Signed)
Rx called in 

## 2011-06-27 ENCOUNTER — Other Ambulatory Visit: Payer: Self-pay | Admitting: *Deleted

## 2011-06-27 MED ORDER — ALPRAZOLAM 0.25 MG PO TABS: 0.2500 mg | ORAL_TABLET | Freq: Three times a day (TID) | ORAL | Status: DC | PRN

## 2011-06-27 NOTE — Telephone Encounter (Signed)
Pt needs a refill on xanax 0.25mg  last filled on 06/08/11 #30 LOV 11/04/10 NOV 08/12/11

## 2011-06-27 NOTE — Telephone Encounter (Signed)
Rx called in 

## 2011-06-27 NOTE — Telephone Encounter (Signed)
Per Dr. Panosh- ok x 1 

## 2011-07-08 ENCOUNTER — Telehealth: Payer: Self-pay | Admitting: *Deleted

## 2011-07-08 MED ORDER — ALPRAZOLAM 0.25 MG PO TABS: 0.2500 mg | ORAL_TABLET | Freq: Three times a day (TID) | ORAL | Status: DC | PRN

## 2011-07-08 NOTE — Telephone Encounter (Signed)
Refill on alprazolam 0.25mg  last filled on 06/27/11 #30 LOV 11/04/10  NOV 08/12/11

## 2011-07-08 NOTE — Telephone Encounter (Signed)
Per Dr. Panosh- ok x 1 

## 2011-07-08 NOTE — Telephone Encounter (Signed)
rx called in

## 2011-07-21 ENCOUNTER — Telehealth: Payer: Self-pay | Admitting: *Deleted

## 2011-07-21 NOTE — Telephone Encounter (Signed)
Pt called stating that she is having another yeast infection again. Left message to call back.

## 2011-07-22 NOTE — Telephone Encounter (Signed)
Spoke to pt- she is having a lot of itching. She is not having discharge. I advised to try monistat external cream. Then if it gets worse then give [place] a call otherwise we will see her in April

## 2011-08-04 ENCOUNTER — Other Ambulatory Visit: Payer: Self-pay

## 2011-08-04 NOTE — Telephone Encounter (Signed)
Rx request for alprazolam 0.25 mg.  Rx last filled 07/08/11 #30 with 0 rf.  Pt last seen 11/04/10.  Pt has an upcoming appt on 08/12/11.  Pls advise.

## 2011-08-08 MED ORDER — ALPRAZOLAM 0.25 MG PO TABS: 0.2500 mg | ORAL_TABLET | Freq: Three times a day (TID) | ORAL | Status: DC | PRN

## 2011-08-08 NOTE — Telephone Encounter (Signed)
Rx called in to pharmacy. 

## 2011-08-08 NOTE — Telephone Encounter (Signed)
Ok x 1

## 2011-08-12 ENCOUNTER — Ambulatory Visit (INDEPENDENT_AMBULATORY_CARE_PROVIDER_SITE_OTHER): Payer: BC Managed Care – PPO | Admitting: Internal Medicine

## 2011-08-12 ENCOUNTER — Encounter: Payer: Self-pay | Admitting: Internal Medicine

## 2011-08-12 VITALS — BP 130/82 | HR 70 | Temp 98.4°F | Wt 155.0 lb

## 2011-08-12 DIAGNOSIS — L293 Anogenital pruritus, unspecified: Secondary | ICD-10-CM

## 2011-08-12 DIAGNOSIS — J069 Acute upper respiratory infection, unspecified: Secondary | ICD-10-CM

## 2011-08-12 DIAGNOSIS — F17200 Nicotine dependence, unspecified, uncomplicated: Secondary | ICD-10-CM

## 2011-08-12 DIAGNOSIS — F339 Major depressive disorder, recurrent, unspecified: Secondary | ICD-10-CM

## 2011-08-12 DIAGNOSIS — R002 Palpitations: Secondary | ICD-10-CM

## 2011-08-12 DIAGNOSIS — I447 Left bundle-branch block, unspecified: Secondary | ICD-10-CM

## 2011-08-12 DIAGNOSIS — F411 Generalized anxiety disorder: Secondary | ICD-10-CM

## 2011-08-12 DIAGNOSIS — K649 Unspecified hemorrhoids: Secondary | ICD-10-CM

## 2011-08-12 DIAGNOSIS — L290 Pruritus ani: Secondary | ICD-10-CM

## 2011-08-12 MED ORDER — ALPRAZOLAM 0.25 MG PO TABS: 0.2500 mg | ORAL_TABLET | Freq: Three times a day (TID) | ORAL | Status: DC | PRN

## 2011-08-12 MED ORDER — SERTRALINE HCL 100 MG PO TABS: 100.0000 mg | ORAL_TABLET | Freq: Every day | ORAL | Status: DC

## 2011-08-12 NOTE — Progress Notes (Signed)
Subjective:    Patient ID: Valerie Love, female    DOB: Dec 20, 1955, 56 y.o.   MRN: [id number]  HPI Patient comes in today for follow up of  multiple medical problems.  Late because has no insurance now  Mood:  celexa and tid xanax .   As needed having to use xanax regularly . 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  Bp : ok  Palpitations: better with xanax.  Stopped taking the b blocker cause made her lethargic and she thinks the xanax helps her palpitations just as well  Tobacco 5 per day.  Hemorrhoid  And vaginal itching    For over a year  prepr h and  otc topicals recurring  Better aftet stopping all but stil gets hemorrhoidal bleeding .  No abd pain  Has uri today for abouit 8 + days ur congestion some phlegm and cough but no cp sob wheeze  Outpatient Prescriptions Prior to Visit  Medication Sig Dispense Refill  . ALPRAZolam (XANAX) 0.25 MG tablet Take 1 tablet (0.25 mg total) by mouth 3 (three) times daily as needed for anxiety.  30 tablet  1  . citalopram (CELEXA) 20 MG tablet TAKE ONE TABLET BY MOUTH ONE TIME DAILY  30 tablet  4  . fluconazole (DIFLUCAN) 150 MG tablet Take 1 tab and can repeat in 3 days  2 tablet  0  . metoprolol succinate (TOPROL XL) 25 MG 24 hr tablet Take half tablet by mouth daily  15 tablet  11  . Naproxen Sodium (ALEVE) 220 MG CAPS Take by mouth as directed.        Review of cards eval  Rapid palpitations - Olga Millers, MD 10/06/2010 9:01 AM Signed  Patient continues to have brief palpitations. I will add Toprol 12.5 mg daily. We discussed a CardioNet but she declined. We will consider this in the future if her symptoms worsen. Note her LV function is low normal to mildly reduced. This may be related to her left bundle branch block. We are adding Toprol as described above. LBBB (left bundle branch block) - Olga Millers, MD 10/06/2010 9:01 AM Signed  Myoview shows no ischemia. No further workup. EKG, abnormal - Olga Millers, MD 10/06/2010 9:02 AM Signed    Myoview negative.     Review of Systems ROS:  GEN/ HEENT: No fever, significant weight changes sweats headaches vision problems hearing changes, CV/ PULM; No chest pain shortness of breath , syncope,edema  change in exercise tolerance. GI /GU: No adominal pain, vomiting, change in bowel habits. . No significant GU symptoms. SKIN/HEME: ,no acute skin rashes suspicious lesions or bleeding. No lymphadenopathy, nodules, masses.  NEURO/ PSYCH:  No neurologic signs such as weakness numbness.  IMM/ Allergy: No unusual infections.  Allergy .   REST of 12 system review negative except as per HPI     Objective:   Physical Exam BP 130/82  Pulse 70  Temp(Src) 98.4 F (36.9 C) (Oral)  Wt 155 lb (70.308 kg)  SpO2 98%  Physical Exam: Vital signs reviewed JYN:WGNF is a well-developed well-nourished alert cooperative  white female who appears her stated age in no acute distress.  Congested  HEENT: normocephalic atraumatic , Eyes: PERRL EOM's full, conjunctiva clear, Nares: paten,t  Congested  no deformity or tenderness., Ears: no deformity EAC's clear TMs with normal landmarks. Mouth: clear OP, no lesions, edema.  Moist mucous membranes. Dentition in adequate repair. NECK: supple without masses, thyromegaly or bruits. CHEST/PULM:  Clear to auscultation and percussion breath  sounds equal no wheeze , rales or rhonchi. No chest wall deformities or tenderness. CV: PMI is nondisplaced, S1 S2 no gallops, murmurs, rubs. Peripheral pulses are full without delay.No JVD .  ABDOMEN: Bowel sounds normal nontender  No guard or rebound, no hepato splenomegal no CVA tenderness.  No hernia. Extremtities:  No clubbing cyanosis or edema, no acute joint swelling or redness no focal atrophy NEURO:  Oriented x3, cranial nerves 3-12 appear to be intact, no obvious focal weakness,gait within normal limits no abnormal reflexes or asymmetrical SKIN: No acute rashes normal turgor, color, no bruising or petechiae. PSYCH:  Oriented, good eye contact, no obvious depression anxiety, cognition and judgment appear normal. LN: no cervical adenopathy      Assessment & Plan:    Palpitations   Responds to xanax    Tobacco   Advised dc all together Anxiety /depression  celexa doesn't seem to control the anxiety and disc using other meds . In the past lexapro worded well but lost insurance and  Had to change .  Disc using zoloft and increase dosing   Risk benefit of medication discussed. Of xanax  And goal is to control the anxiety with controller meds . URI seem uncomplicated at this time and i agree no decongestants could use short term afrin  And stop tobacco. Call if not getting better next week consider rx for sinusitis.  LBBB  Borderline Ef  Neg stress myoview in the past  Se of metoprolol  And not taking  No insurance .    Recurrent perineal itching suspect  From hemorrhoid  Meds.  and then se to topicals  Less is best  But could try anusol for hemorrhoid   May need further eval.  Counseled. About heart health   Make sure  No tobacco and BP is normal   advise cal cards to in from them of no follow up because of finances.  Will fu when possible.

## 2011-08-12 NOTE — Patient Instructions (Signed)
You can try Afrin nose spray for no more than 3 days in a row and alternate nostrils to see if you can relieve some of the pressure.  Stopping smoking will be helpful.  He can try Anusol for hemorrhoids.   Less is best in the anal vaginal area she can get sensitized to topical medications  Please make sure your blood pressure is below 140/90 check her readings a few times a week and if they are okay you can check it monthly. If not call us.  We will switch to Zoloft 100 mg tablets. You can start with 50 mg a day for a week this may not be enough and you can increase to 100 mg a day.  Contact us after 3-4 weeks of 100 mg or as needed.  We can decide to continue on this or not. The average person gets benefit from 100 mg 250 mg a day of Zoloft.   We will refill your Xanax be aware it is dependent producing and can affect mental fogginess however it is very good for anxiety palpitation attacks.  When you can afford it I still recommend a followup with cardiology.  The best thing you can do for your heart health is stop smoking make sure your blood pressure is controlled and eat heart healthy.

## 2011-08-13 ENCOUNTER — Encounter: Payer: Self-pay | Admitting: Internal Medicine

## 2011-08-13 DIAGNOSIS — L293 Anogenital pruritus, unspecified: Secondary | ICD-10-CM | POA: Insufficient documentation

## 2011-08-13 DIAGNOSIS — J069 Acute upper respiratory infection, unspecified: Secondary | ICD-10-CM | POA: Insufficient documentation

## 2011-08-13 DIAGNOSIS — K649 Unspecified hemorrhoids: Secondary | ICD-10-CM | POA: Insufficient documentation

## 2011-10-10 ENCOUNTER — Telehealth: Payer: Self-pay

## 2011-10-10 NOTE — Telephone Encounter (Signed)
Called and spoke with pt and pt is aware.  Med list updated.

## 2011-10-10 NOTE — Telephone Encounter (Signed)
FYI:  Pt states she was told to call back to report that on 09/06/11 pt started new medication Zoloft 50 mg.  Pt states on 09/13/11 she increased the dosage to 100 mg.  Pt states she is going to increase to 150 mg probably tonight and this was discussed with Dr. Fabian Sharp.  Pt states everything else is fine.

## 2011-10-10 NOTE — Telephone Encounter (Signed)
Agree with 100 - 150 mg per day  zoloft .  Call for refill of med  At dosing  Please update med list

## 2011-10-10 NOTE — Telephone Encounter (Signed)
Addended by: Earle Gell C on: 10/10/2011 05:00 PM   Modules accepted: Orders

## 2011-11-01 ENCOUNTER — Telehealth: Payer: Self-pay | Admitting: Cardiology

## 2011-11-01 NOTE — Telephone Encounter (Signed)
Patient called back to inform me that she has lost both her insurance and employment and will call back to schedule what she has gathered her finances.

## 2011-11-01 NOTE — Telephone Encounter (Signed)
New Problem:     I called the patient and was unable to reach them. I left a message on their voicemail with my name, the reason I called, the name of his physician, and a number to call back to schedule their appointment. 

## 2011-11-08 ENCOUNTER — Telehealth: Payer: Self-pay | Admitting: Internal Medicine

## 2011-11-08 NOTE — Telephone Encounter (Signed)
Caller: [name]/Patient; PCP: Madelin Headings.; CB#: [phone]; ; ; Call regarding Abdominal Cramps and Diarrhea, onset 3 weeks, after taking Depressant Med; Pt is currently on 150mg .  Pt wants to titrate off Sertrlaine starting on 7-1, decrease to 100mg .  Pt has Diarrhea episodes after each meal. Afebrile.  All emergent sxs r/o per Diarrhea Protocol. Advised Pt to be seen w/n 24 hrs due to Diarrhea lasting longer than 24 hrs and not improvong w/ home care.  Pt can't come in on 7-3 due to work schedule, Pt requested appt on 7-5, attempted to scheduled Pt changed her mind, would like to discuss with Dr Fabian Sharp due to having no insurance. Please call Pt back to discuss decreasing Zoloft and whether appt is necessary.

## 2011-11-08 NOTE — Telephone Encounter (Signed)
Decrease the zoloft to 100 mg and call after a week and update her symptoms . Or if fever  Other worsening.

## 2011-11-08 NOTE — Telephone Encounter (Signed)
I spoke to the pt to clarify.  The pt has been taking 150mg  of Zoloft x 3wks (started at 50mg ).  About 1 hour to 1.5 hours after meals she has diarrhea.  She also complains of sweating and abdominal cramps.  No other sx.  This started once she reached 150mg .  She would like to go back down to 100mg  to see if that will make a difference.  Please advise.

## 2011-11-09 NOTE — Telephone Encounter (Signed)
Called and spoke to the pt.  Instructed her to drop back to 100mg  and call in 1 week to update [place] as to her condition.

## 2011-11-17 ENCOUNTER — Telehealth: Payer: Self-pay | Admitting: Family Medicine

## 2011-11-17 NOTE — Telephone Encounter (Signed)
Ok to refill x 2  60 each

## 2011-11-17 NOTE — Telephone Encounter (Signed)
Pt last seen 08/12/11.  No f/u.  Please advise.  Thanks!!!

## 2011-11-18 ENCOUNTER — Other Ambulatory Visit: Payer: Self-pay | Admitting: Family Medicine

## 2011-11-18 MED ORDER — ALPRAZOLAM 0.25 MG PO TABS: 0.2500 mg | ORAL_TABLET | Freq: Three times a day (TID) | ORAL | Status: DC | PRN

## 2011-11-18 NOTE — Telephone Encounter (Signed)
Called to the pharmacy.

## 2011-11-22 ENCOUNTER — Telehealth: Payer: Self-pay | Admitting: Family Medicine

## 2011-11-22 NOTE — Telephone Encounter (Signed)
Spoke to the pt by telephone.  She has decreased her Zoloft to 50mg .  Her stomach is better and stool is normal on this dosage.  However, she wants to make sure she is controlling her depression and she also wants to stop smoking.  She is interested in using only Wellbutrin or adding it in addition to the Zoloft.  Please advise.  [phone]

## 2011-11-22 NOTE — Telephone Encounter (Signed)
i advise continued zoloft;  and add on Wellbutrin  wellbutrin does'nt help anxiety that much  better for depression.   rx wellbutrin 150 xr ( 24 hours)1 po qd disp 30 refill x 1   Contact us about status after 3-4 weeks.

## 2011-11-23 ENCOUNTER — Other Ambulatory Visit: Payer: Self-pay | Admitting: Family Medicine

## 2011-11-23 MED ORDER — SERTRALINE HCL 50 MG PO TABS: 50.0000 mg | ORAL_TABLET | Freq: Every day | ORAL | Status: DC

## 2011-11-23 MED ORDER — BUPROPION HCL ER (XL) 150 MG PO TB24: 150.0000 mg | ORAL_TABLET | Freq: Every day | ORAL | Status: DC

## 2011-11-23 NOTE — Telephone Encounter (Signed)
Left message on voicemail for the pt to return my call. 

## 2011-11-23 NOTE — Telephone Encounter (Signed)
Sent medication to the pharmacy.  Pt notified.  Also needed refills of Zoloft.  Also sent.

## 2011-12-23 ENCOUNTER — Telehealth: Payer: Self-pay | Admitting: Family Medicine

## 2011-12-23 NOTE — Telephone Encounter (Signed)
Spoke to the pt.  She still complains of diarrhea while taking Zoloft.  Advised not stopping the medication until notification from [facility].  Advised OTC Imodium or similar medication.  She also complains of yeast infection and discharge.  She has tried OTC Monistat Ovule.  No success.  She has now scheduled for Saturday clinic on 12/24/11 @ 9:30.

## 2011-12-24 ENCOUNTER — Ambulatory Visit (INDEPENDENT_AMBULATORY_CARE_PROVIDER_SITE_OTHER): Payer: BC Managed Care – PPO | Admitting: Family Medicine

## 2011-12-24 ENCOUNTER — Encounter: Payer: Self-pay | Admitting: Family Medicine

## 2011-12-24 VITALS — BP 124/80 | HR 68 | Temp 98.4°F | Wt 155.0 lb

## 2011-12-24 DIAGNOSIS — K649 Unspecified hemorrhoids: Secondary | ICD-10-CM

## 2011-12-24 DIAGNOSIS — B379 Candidiasis, unspecified: Secondary | ICD-10-CM

## 2011-12-24 DIAGNOSIS — K529 Noninfective gastroenteritis and colitis, unspecified: Secondary | ICD-10-CM

## 2011-12-24 DIAGNOSIS — R197 Diarrhea, unspecified: Secondary | ICD-10-CM

## 2011-12-24 DIAGNOSIS — B49 Unspecified mycosis: Secondary | ICD-10-CM

## 2011-12-24 MED ORDER — FLUCONAZOLE 150 MG PO TABS: 150.0000 mg | ORAL_TABLET | Freq: Once | ORAL | Status: AC

## 2011-12-24 NOTE — Patient Instructions (Signed)
Candida Infection, Adult A candida infection (also called yeast, fungus and Monilia infection) is an overgrowth of yeast that can occur anywhere on the body. A yeast infection commonly occurs in warm, moist body areas. Usually, the infection remains localized but can spread to become a systemic infection. A yeast infection may be a sign of a more severe disease such as diabetes, leukemia, or AIDS. A yeast infection can occur in both men and women. In women, Candida vaginitis is a vaginal infection. It is one of the most common causes of vaginitis. Men usually do not have symptoms or know they have an infection until other problems develop. Men may find out they have a yeast infection because their sex partner has a yeast infection. Uncircumcised men are more likely to get a yeast infection than circumcised men. This is because the uncircumcised glans is not exposed to air and does not remain as dry as that of a circumcised glans. Older adults may develop yeast infections around dentures. CAUSES   Women  Antibiotics.   Steroid medication taken for a long time.   Being overweight (obese).   Diabetes.   Poor immune condition.   Certain serious medical conditions.   Immune suppressive medications for organ transplant patients.   Chemotherapy.   Pregnancy.   Menstration.   Stress and fatigue.   Intravenous drug use.   Oral contraceptives.   Wearing tight-fitting clothes in the crotch area.   Catching it from a sex partner who has a yeast infection.   Spermicide.   Intravenous, urinary, or other catheters.  Men  Catching it from a sex partner who has a yeast infection.   Having oral or anal sex with a person who has the infection.   Spermicide.   Diabetes.   Antibiotics.   Poor immune system.   Medications that suppress the immune system.   Intravenous drug use.   Intravenous, urinary, or other catheters.  SYMPTOMS   Women  Thick, white vaginal discharge.    Vaginal itching.   Redness and swelling in and around the vagina.   Irritation of the lips of the vagina and perineum.   Blisters on the vaginal lips and perineum.   Painful sexual intercourse.   Low blood sugar (hypoglycemia).   Painful urination.   Bladder infections.   Intestinal problems such as constipation, indigestion, bad breath, bloating, increase in gas, diarrhea, or loose stools.  Men  Men may develop intestinal problems such as constipation, indigestion, bad breath, bloating, increase in gas, diarrhea, or loose stools.   Dry, cracked skin on the penis with itching or discomfort.   Jock itch.   Dry, flaky skin.   Athlete's foot.   Hypoglycemia.  DIAGNOSIS   Women  A history and an exam are performed.   The discharge may be examined under a microscope.   A culture may be taken of the discharge.  Men  A history and an exam are performed.   Any discharge from the penis or areas of cracked skin will be looked at under the microscope and cultured.   Stool samples may be cultured.  TREATMENT   Women  Vaginal antifungal suppositories and creams.   Medicated creams to decrease irritation and itching on the outside of the vagina.   Warm compresses to the perineal area to decrease swelling and discomfort.   Oral antifungal medications.   Medicated vaginal suppositories or cream for repeated or recurrent infections.   Wash and dry the irritation areas before applying  the cream.   Eating yogurt with lactobacillus may help with prevention and treatment.   Sometimes painting the vagina with gentian violet solution may help if creams and suppositories do not work.  Men  Antifungal creams and oral antifungal medications.   Sometimes treatment must continue for 30 days after the symptoms go away to prevent recurrence.  HOME CARE INSTRUCTIONS   Women  Use cotton underwear and avoid tight-fitting clothing.   Avoid colored, scented toilet paper and  deodorant tampons or pads.   Do not douche.   Keep your diabetes under control.   Finish all the prescribed medications.   Keep your skin clean and dry.   Consume milk or yogurt with lactobacillus active culture regularly. If you get frequent yeast infections and think that is what the infection is, there are over-the-counter medications that you can get. If the infection does not show healing in 3 days, talk to your caregiver.   Tell your sex partner you have a yeast infection. Your partner may need treatment also, especially if your infection does not clear up or recurs.  Men  Keep your skin clean and dry.   Keep your diabetes under control.   Finish all prescribed medications.   Tell your sex partner that you have a yeast infection so they can be treated if necessary.  SEEK MEDICAL CARE IF:    Your symptoms do not clear up or worsen in one week after treatment.   You have an oral temperature above 102 F (38.9 C).   You have trouble swallowing or eating for a prolonged time.   You develop blisters on and around your vagina.   You develop vaginal bleeding and it is not your menstrual period.   You develop abdominal pain.   You develop intestinal problems as mentioned above.   You get weak or lightheaded.   You have painful or increased urination.   You have pain during sexual intercourse.  MAKE SURE YOU:    Understand these instructions.   Will watch your condition.   Will get help right away if you are not doing well or get worse.  Document Released: 06/02/2004 Document Revised: 04/14/2011 Document Reviewed: 09/14/2009 [facility] Patient Information 2012 [place], [place].Hemorrhoids Hemorrhoids are enlarged (dilated) veins around the rectum. There are 2 types of hemorrhoids, and the type of hemorrhoid is determined by its location.  Internal hemorrhoids occur in the veins just inside the rectum. They are usually not painful, but they may bleed. However, they may  poke through to the outside and become irritated and painful. External hemorrhoids involve the veins outside the anus and can be felt as a painful swelling or hard lump near the anus. They are often itchy and may crack and bleed. Sometimes clots will form in the veins. This makes them swollen and painful. These are called thrombosed hemorrhoids. CAUSES Causes of hemorrhoids include:  Pregnancy. This increases the pressure in the hemorrhoidal veins.   Constipation.   Straining to have a bowel movement.   Obesity.   Heavy lifting or other activity that caused you to strain.  TREATMENT Most of the time hemorrhoids improve in 1 to 2 weeks. However, if symptoms do not seem to be getting better or if you have a lot of rectal bleeding, your caregiver may perform a procedure to help make the hemorrhoids get smaller or remove them completely. Possible treatments include:  Rubber band ligation. A rubber band is placed at the base of the hemorrhoid to cut  off the circulation.   Sclerotherapy. A chemical is injected to shrink the hemorrhoid.   Infrared light therapy. Tools are used to burn the hemorrhoid.   Hemorrhoidectomy. This is surgical removal of the hemorrhoid.  HOME CARE INSTRUCTIONS    Increase fiber in your diet. Ask your caregiver about using fiber supplements.   Drink enough water and fluids to keep your urine clear or pale yellow.   Exercise regularly.   Go to the bathroom when you have the urge to have a bowel movement. Do not wait.   Avoid straining to have bowel movements.   Keep the anal area dry and clean.   Only take over-the-counter or prescription medicines for pain, discomfort, or fever as directed by your caregiver.  If your hemorrhoids are thrombosed:  Take warm sitz baths for 20 to 30 minutes, 3 to 4 times per day.   If the hemorrhoids are very tender and swollen, place ice packs on the area as tolerated. Using ice packs between sitz baths may be helpful. Fill a  plastic bag with ice. Place a towel between the bag of ice and your skin.   Medicated creams and suppositories may be used or applied as directed.   Do not use a donut-shaped pillow or sit on the toilet for long periods. This increases blood pooling and pain.  SEEK MEDICAL CARE IF:    You have increasing pain and swelling that is not controlled with your medicine.   You have uncontrolled bleeding.   You have difficulty or you are unable to have a bowel movement.   You have pain or inflammation outside the area of the hemorrhoids.   You have chills or an oral temperature above 102 F (38.9 C).  MAKE SURE YOU:    Understand these instructions.   Will watch your condition.   Will get help right away if you are not doing well or get worse.  Document Released: 04/22/2000 Document Revised: 04/14/2011 Document Reviewed: 08/28/2007 [facility] Patient Information 2012 [place], [place].

## 2011-12-24 NOTE — Progress Notes (Signed)
  Subjective:    Patient ID: Valerie Love, female    DOB: [birth year], 56 y.o.   MRN: [id number]  HPI Recurring yeast infection in the last year and half. Sometimes monistat will help.   A few times has had to take diflucan. This time when it started it flared her hemorrhoids as well.  Has been having diarrhea recently as well. Never had a colonoscopy. Doesn't have insurance.  Says everytime she eats goes to the bathroom but does have some form. Does drink a pot a day of coffee. No artificial sweetners.  Notices some swelling and throbbing in ht vaginal area.  Says her pap smear is uptodate.    Review of Systems     Objective:   Physical Exam  Constitutional: She appears well-developed and well-nourished.  Genitourinary:       Inner labia is red and swollen with cottage cheese discharge.    Skin: Skin is warm and dry.  Psychiatric: She has a normal mood and affect. Her behavior is normal.          Assessment & Plan:  Yeast infection - will tx with diflucan. Wet prep sent for futher evaluation. She may be a good candidate for suppressive therapy since she has had recurrent infections over the lst 18 months. No other evidence that diet, et affects this. She doesn't douche. Told her to avoid this.   Frequent stools but not actually loose - Discussed increasing fiber adn cutt back significantly on caffeine to reduce stool frequency. If still a problem then consider seeing GI.

## 2011-12-24 NOTE — Addendum Note (Signed)
Addended by: Kern Reap B on: 12/24/2011 11:09 AM   Modules accepted: Orders

## 2011-12-30 ENCOUNTER — Other Ambulatory Visit: Payer: Self-pay | Admitting: Family Medicine

## 2011-12-30 MED ORDER — FLUCONAZOLE 150 MG PO TABS: 150.0000 mg | ORAL_TABLET | Freq: Once | ORAL | Status: AC

## 2011-12-30 MED ORDER — ESCITALOPRAM OXALATE 10 MG PO TABS: 10.0000 mg | ORAL_TABLET | Freq: Every day | ORAL | Status: DC

## 2012-01-04 ENCOUNTER — Telehealth: Payer: Self-pay | Admitting: Family Medicine

## 2012-01-04 NOTE — Telephone Encounter (Signed)
Patient called back today to inform [facility] that she is still having diarrhea even though her medication has been changed several times.  WP advises an OV at this time.  Tried reaching the pt by telephone.  Left message on voicemail.  Waiting on reply.

## 2012-01-05 NOTE — Telephone Encounter (Signed)
Left message for the pt to return my call.  Informed her that today is WP's half day and may need to try back tomorrow.

## 2012-01-06 NOTE — Telephone Encounter (Signed)
Spoke to the pt.  She informed me that the diarrhea is better since off old meds.  Yeast is also better.  Instructed her to call back if sx worsen to make OV appt with WP.

## 2012-01-06 NOTE — Telephone Encounter (Signed)
Left message on cell for the pt to return my call. 

## 2012-01-10 ENCOUNTER — Telehealth: Payer: Self-pay | Admitting: Family Medicine

## 2012-01-10 NOTE — Telephone Encounter (Signed)
Pt is requesting refills.  Last seen 08/12/11.  Last filled 11/18/11 #60 with 1 additional refill.  Please advise.  Thanks!!!

## 2012-01-12 ENCOUNTER — Other Ambulatory Visit: Payer: Self-pay | Admitting: Family Medicine

## 2012-01-12 MED ORDER — ALPRAZOLAM 0.25 MG PO TABS: 0.2500 mg | ORAL_TABLET | Freq: Three times a day (TID) | ORAL | Status: DC | PRN

## 2012-01-12 NOTE — Telephone Encounter (Signed)
Can refill x 1    Needs OV  Before next refill 

## 2012-01-12 NOTE — Telephone Encounter (Signed)
Called in to the pharmacy.  Left on voicemail. Left message that she will need OV before other refills will be given per [facility].

## 2012-01-19 ENCOUNTER — Other Ambulatory Visit: Payer: Self-pay | Admitting: Internal Medicine

## 2012-02-20 ENCOUNTER — Telehealth: Payer: Self-pay | Admitting: Family Medicine

## 2012-02-20 NOTE — Telephone Encounter (Signed)
Pt is requesting a refill.  Last seen on 08/12/11.  No follow up scheduled.  Last filled on 01/12/12 #30 0 additional refills.

## 2012-02-20 NOTE — Telephone Encounter (Signed)
last request  says needs ov before next refill. Call pt ;see how she is doing and help her get ov before filling rx.  Then can refill up until appt if 30 days or less.

## 2012-02-21 ENCOUNTER — Other Ambulatory Visit: Payer: Self-pay | Admitting: Internal Medicine

## 2012-02-21 MED ORDER — ALPRAZOLAM 0.25 MG PO TABS: 0.2500 mg | ORAL_TABLET | Freq: Three times a day (TID) | ORAL | Status: DC | PRN

## 2012-02-21 NOTE — Telephone Encounter (Signed)
[facility] for the pt to return my call.

## 2012-02-21 NOTE — Telephone Encounter (Signed)
Pt is scheduled with WP.  Called to the pharmacy.

## 2012-03-02 ENCOUNTER — Ambulatory Visit: Payer: BC Managed Care – PPO | Admitting: Internal Medicine

## 2012-03-12 ENCOUNTER — Ambulatory Visit (INDEPENDENT_AMBULATORY_CARE_PROVIDER_SITE_OTHER): Payer: Self-pay | Admitting: Internal Medicine

## 2012-03-12 ENCOUNTER — Encounter: Payer: Self-pay | Admitting: Internal Medicine

## 2012-03-12 VITALS — BP 138/80 | HR 82 | Temp 98.8°F | Wt 156.0 lb

## 2012-03-12 DIAGNOSIS — I447 Left bundle-branch block, unspecified: Secondary | ICD-10-CM

## 2012-03-12 DIAGNOSIS — F17200 Nicotine dependence, unspecified, uncomplicated: Secondary | ICD-10-CM

## 2012-03-12 DIAGNOSIS — Z5181 Encounter for therapeutic drug level monitoring: Secondary | ICD-10-CM | POA: Insufficient documentation

## 2012-03-12 DIAGNOSIS — F4323 Adjustment disorder with mixed anxiety and depressed mood: Secondary | ICD-10-CM

## 2012-03-12 DIAGNOSIS — M2550 Pain in unspecified joint: Secondary | ICD-10-CM

## 2012-03-12 MED ORDER — BUPROPION HCL ER (XL) 150 MG PO TB24: 150.0000 mg | ORAL_TABLET | Freq: Every day | ORAL | Status: DC

## 2012-03-12 MED ORDER — ESCITALOPRAM OXALATE 10 MG PO TABS: 10.0000 mg | ORAL_TABLET | Freq: Every day | ORAL | Status: DC

## 2012-03-12 NOTE — Progress Notes (Signed)
Chief Complaint  Patient presents with  . Follow-up   HPI: Patient comes in today for follow up of  multiple medical problems.  Med evaluation Last ov was  4 13 she has had no insurance coverage and we have been rx over the phone. Less palpations  Mid chest .  floaty  Flip flop. . No assoc sx and feels was from anxiety in past. She had diarrhea with zoloft.  Sleep well.  ocass tylenol  Pm. Decrease  3 per day.  Tobacco.   And doing better .   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 Still taking prn naproxyn for arthritis and joint aches  ROS: See pertinent positives and negatives per HPI. No hx of bleeding bruising gi bleeding  Past Medical History  Diagnosis Date  . Arthritis   . Thyroid disease in pregnancy   . Childhood asthma   . Hx of varicella   . History of jaundice     as a child    . Depression     related to husband illness and death    Family History  Problem Relation Age of Onset  . Alcohol abuse Mother     died from stomach ulcers  . Stroke Father     died  from cva and mi  . Hypertension      parent  . Hyperlipidemia      parent  . Coronary artery disease Father     Age 66    History   Social History  . Marital Status: Married    Spouse Name: N/A    Number of Children: 3  . Years of Education: N/A   Occupational History  .      Catering and cleaning   Social History Main Topics  . Smoking status: Current Some Day Smoker -- 0.5 packs/day for 40 years    Types: Cigarettes  . Smokeless tobacco: None     Comment: about 5 cigarettes a day per pt.   . Alcohol Use: Yes     Comment: socially  . Drug Use: No  . Sexually Active: None   Other Topics Concern  . None   Social History Narrative   Husband passed away on March 08, 2008 hep c and cirrhosis.   She is hep c negative [id number] of 2-3  son no pets No falls .  Has smoke detector and wears seat belts. POsitive  firearms. No excess sun exposure. 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 Daily. ocass etoh.  No rec drugs.Daughter patient in our practice   [name]    Current outpatient prescriptions:ALPRAZolam Prudy Feeler) 0.25 MG tablet, Take 1 tablet (0.25 mg total) by mouth 3 (three) times daily as needed for anxiety., Disp: 30 tablet, Rfl: 0;  buPROPion (WELLBUTRIN XL) 150 MG 24 hr tablet, Take 1 tablet (150 mg total) by mouth daily., Disp: 30 tablet, Rfl: 11;  escitalopram (LEXAPRO) 10 MG tablet, Take 1 tablet (10 mg total) by mouth daily., Disp: 30 tablet, Rfl: 11 Naproxen Sodium (ALEVE) 220 MG CAPS, Take by mouth as directed. , Disp: , Rfl: ;  [DISCONTINUED] buPROPion (WELLBUTRIN XL) 150 MG 24 hr tablet, TAKE ONE TABLET BY MOUTH ONE TIME DAILY, Disp: 30 tablet, Rfl: 2;  [DISCONTINUED] escitalopram (LEXAPRO) 10 MG tablet, Take 1 tablet (10 mg total) by mouth daily., Disp: 30 tablet, Rfl: 3  EXAM:  BP 138/80  Pulse 82  Temp 98.8 F (37.1 C) (Oral)  Wt 156 lb (70.761 kg)  SpO2 96% Wt Readings from Last 3 Encounters:  03/12/12 156 lb (70.761 kg)  12/24/11 155 lb (70.308 kg)  08/12/11 155 lb (70.308 kg)    GENERAL: vitals reviewed and listed above, alert, oriented, appears well hydrated and in no acute distress  HEENT: atraumatic, conjunttiva clear, no obvious abnormalities on inspection of external nose and ears OP : no lesion edema or exudate   NECK: no obvious masses on inspection palpation  No bruits   LUNGS: clear to auscultation bilaterally, no wheezes, rales or rhonchi, bs =  CV: HRRR, no clubbing cyanosis or  peripheral edema nl cap refill  Abdomen:  Sof,t normal bowel sounds without hepatosplenomegaly, no guarding rebound or masses no CVA tenderness  MS: moves all extremities without noticeable focal  Abnormality some oa changes hands no redness   PSYCH: pleasant and cooperative, no obvious depression or anxiety Lab Results  Component  Value Date   WBC 6.3 08/18/2010   HGB 14.0 08/18/2010   HCT 41.5 08/18/2010   PLT 258.0 08/18/2010   GLUCOSE 75 08/18/2010   CHOL 184 08/18/2010   TRIG 37.0 08/18/2010   HDL 69.10 08/18/2010   LDLCALC 108* 08/18/2010   ALT 16 08/18/2010   AST 20 08/18/2010   NA 143 08/18/2010   K 4.4 08/18/2010   CL 107 08/18/2010   CREATININE 0.7 08/18/2010   BUN 22 08/18/2010   CO2 29 08/18/2010   TSH 0.53 08/18/2010    ASSESSMENT AND PLAN:  Discussed the following assessment and plan:  1. Medication monitoring encounter   2. TOBACCO DEPENDENCE    has almost stopped   3. LBBB (left bundle branch block)   4. ARTHRALGIA, UNSPECIFIED   5. Adjustment reaction with anxiety and depression    Doing much better  Follow and maintain on meds    No current insuance coverage refill for 1 year  Call about xanax. Come for PV when able  If can get insurance coverage.  -Patient advised to return or notify a doctor immediately if symptoms worsen or persist or new concerns arise.  Patient Instructions  Agree with stopping tobacco. Reasonable to add Aspirin 81 mg   To decrease risk reduction even 3 x per week.  Gals you are doing better. Continue medication.   Contact us for further xanax  Refills.  Make sure your BP readings are good.  Preventive visit when possible.   Get a flu vaccine where possible.   disc risk of bleeding is risk of asa .   Goal below 140/90   PANOSH,WANDA KOTVAN

## 2012-03-12 NOTE — Patient Instructions (Addendum)
Agree with stopping tobacco. Reasonable to add Aspirin 81 mg   To decrease risk reduction even 3 x per week.  Gals you are doing better. Continue medication.   Contact us for further xanax  Refills.  Make sure your BP readings are good.  Preventive visit when possible.   Get a flu vaccine where possible.

## 2012-04-18 ENCOUNTER — Other Ambulatory Visit: Payer: Self-pay | Admitting: Internal Medicine

## 2012-05-29 ENCOUNTER — Telehealth: Payer: Self-pay | Admitting: Family Medicine

## 2012-05-29 NOTE — Telephone Encounter (Signed)
Ok to refill x 1  

## 2012-05-29 NOTE — Telephone Encounter (Signed)
Pt is requesting refills.  Last seen on 03/22/12.  Last filled on 03/22/12 #30.  No follow up made.  Please advise.  Thanks!!

## 2012-05-30 ENCOUNTER — Other Ambulatory Visit: Payer: Self-pay | Admitting: Internal Medicine

## 2012-05-30 MED ORDER — ALPRAZOLAM 0.25 MG PO TABS: 0.2500 mg | ORAL_TABLET | Freq: Three times a day (TID) | ORAL | Status: DC | PRN

## 2012-05-30 NOTE — Telephone Encounter (Signed)
Called to [facility] and left on voicemail. 

## 2012-06-08 ENCOUNTER — Telehealth: Payer: Self-pay | Admitting: *Deleted

## 2012-06-08 MED ORDER — FLUCONAZOLE 150 MG PO TABS: 150.0000 mg | ORAL_TABLET | Freq: Once | ORAL | Status: DC

## 2012-06-08 NOTE — Telephone Encounter (Signed)
Received message from pt c/o yeast infection has tried OTC medication with no relief pt would like Diflucan tablet. Please advise.

## 2012-06-08 NOTE — Telephone Encounter (Signed)
Pt notified Rx for Diflucan sent to pharmacy if no improvement needs to make an appt to evaluate per Dr. Fabian Sharp. Pt verbalized understanding.

## 2012-06-08 NOTE — Telephone Encounter (Signed)
Ok diflucan 150 x 1 and if not better plan OV to evaluate.

## 2012-06-15 ENCOUNTER — Ambulatory Visit (INDEPENDENT_AMBULATORY_CARE_PROVIDER_SITE_OTHER): Payer: Self-pay | Admitting: Internal Medicine

## 2012-06-15 ENCOUNTER — Encounter: Payer: Self-pay | Admitting: Internal Medicine

## 2012-06-15 VITALS — BP 120/84 | HR 74 | Temp 98.1°F | Wt 150.0 lb

## 2012-06-15 DIAGNOSIS — B372 Candidiasis of skin and nail: Secondary | ICD-10-CM

## 2012-06-15 DIAGNOSIS — N760 Acute vaginitis: Secondary | ICD-10-CM

## 2012-06-15 DIAGNOSIS — L293 Anogenital pruritus, unspecified: Secondary | ICD-10-CM

## 2012-06-15 DIAGNOSIS — R21 Rash and other nonspecific skin eruption: Secondary | ICD-10-CM

## 2012-06-15 DIAGNOSIS — L290 Pruritus ani: Secondary | ICD-10-CM

## 2012-06-15 MED ORDER — METRONIDAZOLE 500 MG PO TABS: 500.0000 mg | ORAL_TABLET | Freq: Two times a day (BID) | ORAL | Status: DC

## 2012-06-15 MED ORDER — TERCONAZOLE 0.4 % VA CREA: 1.0000 | TOPICAL_CREAM | Freq: Every day | VAGINAL | Status: DC

## 2012-06-15 NOTE — Progress Notes (Signed)
Chief Complaint  Patient presents with  . Follow-up    Yeast.  Continues to be symptomatic.    HPI: Patient comes in today for SDA for  new problem evaluation. She has had recurrent "yeast" usually gets better with 2 diflucan Recurrent  Itching and  Feels nasty.     Last treatment diflucan    2 day and then helps..not this time  Feels like when she had BV last year . No topicals being used  Hard to stay clean some discharge no uti sx .   Last antibiotics   A while back  Has had sugars checked recently and doesn't have dm.  ROS: See pertinent positives and negatives per HPI.  Past Medical History  Diagnosis Date  . Arthritis   . Thyroid disease in pregnancy   . Childhood asthma   . Hx of varicella   . History of jaundice     as a child    . Depression     related to husband illness and death    Family History  Problem Relation Age of Onset  . Alcohol abuse Mother     died from stomach ulcers  . Stroke Father     died  from cva and mi  . Hypertension      parent  . Hyperlipidemia      parent  . Coronary artery disease Father     Age 29    History   Social History  . Marital Status: Married    Spouse Name: N/A    Number of Children: 3  . Years of Education: N/A   Occupational History  .      Catering and cleaning   Social History Main Topics  . Smoking status: Current Some Day Smoker -- 0.50 packs/day for 40 years    Types: Cigarettes  . Smokeless tobacco: None     Comment: about 5 cigarettes a day per pt.   . Alcohol Use: Yes     Comment: socially  . Drug Use: No  . Sexually Active: None   Other Topics Concern  . None   Social History Narrative   Husband passed away on 03-05-08 hep c and cirrhosis.   She is hep c negative 2006   G3P3   HH of 2-3  son no pets    No falls .  Has smoke detector and wears seat belts. POsitive  firearms. No excess sun exposure.    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   Currently no insuranced  and cannot affort 800 per month for catastrophic    Still smokes some. Daily. ocass etoh.  No rec drugs.   Daughter patient in our practice   Valerie Love    Outpatient Encounter Prescriptions as of 06/15/2012  Medication Sig Dispense Refill  . ALPRAZolam (XANAX) 0.25 MG tablet Take 1 tablet (0.25 mg total) by mouth 3 (three) times daily as needed for anxiety.  30 tablet  0  . buPROPion (WELLBUTRIN XL) 150 MG 24 hr tablet Take 1 tablet (150 mg total) by mouth daily.  30 tablet  11  . Naproxen Sodium (ALEVE) 220 MG CAPS Take by mouth as directed.       . escitalopram (LEXAPRO) 10 MG tablet Take 1 tablet (10 mg total) by mouth daily.  30 tablet  11  . metroNIDAZOLE (FLAGYL) 500 MG tablet Take 1 tablet (500 mg total) by mouth 2 (two) times daily.  14  tablet  0  . terconazole (TERAZOL 7) 0.4 % vaginal cream Place 1 applicator vaginally at bedtime.  45 g  0  . [DISCONTINUED] fluconazole (DIFLUCAN) 150 MG tablet Take 1 tablet (150 mg total) by mouth once.  1 tablet  0   No facility-administered encounter medications on file as of 06/15/2012.    EXAM:  BP 120/84  Pulse 74  Temp(Src) 98.1 F (36.7 C) (Oral)  Wt 150 lb (68.04 kg)  BMI 28.36 kg/m2  SpO2 97%  Body mass index is 28.36 kg/(m^2).  GENERAL: vitals reviewed and listed above, alert, oriented, appears well hydrated and in no acute distress Ext gu  Posterior fourchette  And perineal area   Red  Diffuse with  Satellite lesions no vesicles or blisters  Clear weeping noted. Rectal area pink hem tag no lesions vaginal  White dc homogeneous  No lesions  PSYCH: pleasant and cooperative, no obvious depression or anxiety  ASSESSMENT AND PLAN:  Discussed the following assessment and plan:  1. Perineal rash in female   2. Vulvitis or vulvovaginitis, acute    recurrent    3. Candidal dermatitis   4. Perineal itching, female   mostly around the perianal area.   With satelittie lesion and weeping   there is a cellulitis but  rx for topical  years and bv as has had bv in the past  Disc keeping dry to help heal. burows etc .   -Patient advised to return or notify health care team  if symptoms worsen or persist or new concerns arise.  Patient Instructions  This  could be Bacterial vagenosis   As above.  The redness and irritation at the rectal area looks like yeast also.  On the skin like a diaper rash . ocass can also be caused b skin bacteria .  Cool compresses keep dry as possible   Try burrows compresses soaks to keep clean.  And dry .  Topical  terazol  Cream intravaginal AND external area.   If runs out of terazole can use OTC  Lotrimin in that area .  Treatment for BV   With metronidazole.   Contact us   In about a week about how this is doing . Bacterial Vaginosis Bacterial vaginosis (BV) is a vaginal infection where the normal balance of bacteria in the vagina is disrupted. The normal balance is then replaced by an overgrowth of certain bacteria. There are several different kinds of bacteria that can cause BV. BV is the most common vaginal infection in women of childbearing age. CAUSES   The cause of BV is not fully understood. BV develops when there is an increase or imbalance of harmful bacteria.  Some activities or behaviors can upset the normal balance of bacteria in the vagina and put women at increased risk including:  Having a new sex partner or multiple sex partners.  Douching.  Using an intrauterine device (IUD) for contraception.  It is not clear what role sexual activity plays in the development of BV. However, women that have never had sexual intercourse are rarely infected with BV. Women do not get BV from toilet seats, bedding, swimming pools or from touching objects around them.  SYMPTOMS   Grey vaginal discharge.  A fish-like odor with discharge, especially after sexual intercourse.  Itching or burning of the vagina and vulva.  Burning or pain with urination.  Some women have no signs or  symptoms at all. DIAGNOSIS  Your caregiver must examine the vagina for  signs of BV. Your caregiver will perform lab tests and look at the sample of vaginal fluid through a microscope. They will look for bacteria and abnormal cells (clue cells), a pH test higher than 4.5, and a positive amine test all associated with BV.  RISKS AND COMPLICATIONS   Pelvic inflammatory disease (PID).  Infections following gynecology surgery.  Developing HIV.  Developing herpes virus. TREATMENT  Sometimes BV will clear up without treatment. However, all women with symptoms of BV should be treated to avoid complications, especially if gynecology surgery is planned. Female partners generally do not need to be treated. However, BV may spread between female sex partners so treatment is helpful in preventing a recurrence of BV.   BV may be treated with antibiotics. The antibiotics come in either pill or vaginal cream forms. Either can be used with nonpregnant or pregnant women, but the recommended dosages differ. These antibiotics are not harmful to the baby.  BV can recur after treatment. If this happens, a second round of antibiotics will often be prescribed.  Treatment is important for pregnant women. If not treated, BV can cause a premature delivery, especially for a pregnant woman who had a premature birth in the past. All pregnant women who have symptoms of BV should be checked and treated.  For chronic reoccurrence of BV, treatment with a type of prescribed gel vaginally twice a week is helpful. HOME CARE INSTRUCTIONS   Finish all medication as directed by your caregiver.  Do not have sex until treatment is completed.  Tell your sexual partner that you have a vaginal infection. They should see their caregiver and be treated if they have problems, such as a mild rash or itching.  Practice safe sex. Use condoms. Only have 1 sex partner. PREVENTION  Basic prevention steps can help reduce the risk of  upsetting the natural balance of bacteria in the vagina and developing BV:  Do not have sexual intercourse (be abstinent).  Do not douche.  Use all of the medicine prescribed for treatment of BV, even if the signs and symptoms go away.  Tell your sex partner if you have BV. That way, they can be treated, if needed, to prevent reoccurrence. SEEK MEDICAL CARE IF:   Your symptoms are not improving after 3 days of treatment.  You have increased discharge, pain, or fever. MAKE SURE YOU:   Understand these instructions.  Will watch your condition.  Will get help right away if you are not doing well or get worse. FOR MORE INFORMATION  Division of STD Prevention (DSTDP), Centers for Disease Control and Prevention: SolutionApps.co.za American Social Health Association (ASHA): www.ashastd.org  Document Released: 04/25/2005 Document Revised: 07/18/2011 Document Reviewed: 10/16/2008 [facility] Patient Information 2013 [place], [place].            Valerie Love. Valerie Love M.D.

## 2012-06-15 NOTE — Patient Instructions (Addendum)
This  could be Bacterial vagenosis   As above.  The redness and irritation at the rectal area looks like yeast also.  On the skin like a diaper rash . ocass can also be caused b skin bacteria .  Cool compresses keep dry as possible   Try burrows compresses soaks to keep clean.  And dry .  Topical  terazol  Cream intravaginal AND external area.   If runs out of terazole can use OTC  Lotrimin in that area .  Treatment for BV   With metronidazole.   Contact us   In about a week about how this is doing . Bacterial Vaginosis Bacterial vaginosis (BV) is a vaginal infection where the normal balance of bacteria in the vagina is disrupted. The normal balance is then replaced by an overgrowth of certain bacteria. There are several different kinds of bacteria that can cause BV. BV is the most common vaginal infection in women of childbearing age. CAUSES   The cause of BV is not fully understood. BV develops when there is an increase or imbalance of harmful bacteria.  Some activities or behaviors can upset the normal balance of bacteria in the vagina and put women at increased risk including:  Having a new sex partner or multiple sex partners.  Douching.  Using an intrauterine device (IUD) for contraception.  It is not clear what role sexual activity plays in the development of BV. However, women that have never had sexual intercourse are rarely infected with BV. Women do not get BV from toilet seats, bedding, swimming pools or from touching objects around them.  SYMPTOMS   Grey vaginal discharge.  A fish-like odor with discharge, especially after sexual intercourse.  Itching or burning of the vagina and vulva.  Burning or pain with urination.  Some women have no signs or symptoms at all. DIAGNOSIS  Your caregiver must examine the vagina for signs of BV. Your caregiver will perform lab tests and look at the sample of vaginal fluid through a microscope. They will look for bacteria and  abnormal cells (clue cells), a pH test higher than 4.5, and a positive amine test all associated with BV.  RISKS AND COMPLICATIONS   Pelvic inflammatory disease (PID).  Infections following gynecology surgery.  Developing HIV.  Developing herpes virus. TREATMENT  Sometimes BV will clear up without treatment. However, all women with symptoms of BV should be treated to avoid complications, especially if gynecology surgery is planned. Female partners generally do not need to be treated. However, BV may spread between female sex partners so treatment is helpful in preventing a recurrence of BV.   BV may be treated with antibiotics. The antibiotics come in either pill or vaginal cream forms. Either can be used with nonpregnant or pregnant women, but the recommended dosages differ. These antibiotics are not harmful to the baby.  BV can recur after treatment. If this happens, a second round of antibiotics will often be prescribed.  Treatment is important for pregnant women. If not treated, BV can cause a premature delivery, especially for a pregnant woman who had a premature birth in the past. All pregnant women who have symptoms of BV should be checked and treated.  For chronic reoccurrence of BV, treatment with a type of prescribed gel vaginally twice a week is helpful. HOME CARE INSTRUCTIONS   Finish all medication as directed by your caregiver.  Do not have sex until treatment is completed.  Tell your sexual partner that you have a  vaginal infection. They should see their caregiver and be treated if they have problems, such as a mild rash or itching.  Practice safe sex. Use condoms. Only have 1 sex partner. PREVENTION  Basic prevention steps can help reduce the risk of upsetting the natural balance of bacteria in the vagina and developing BV:  Do not have sexual intercourse (be abstinent).  Do not douche.  Use all of the medicine prescribed for treatment of BV, even if the signs and  symptoms go away.  Tell your sex partner if you have BV. That way, they can be treated, if needed, to prevent reoccurrence. SEEK MEDICAL CARE IF:   Your symptoms are not improving after 3 days of treatment.  You have increased discharge, pain, or fever. MAKE SURE YOU:   Understand these instructions.  Will watch your condition.  Will get help right away if you are not doing well or get worse. FOR MORE INFORMATION  Division of STD Prevention (DSTDP), Centers for Disease Control and Prevention: SolutionApps.co.za American Social Health Association (ASHA): www.ashastd.org  Document Released: 04/25/2005 Document Revised: 07/18/2011 Document Reviewed: 10/16/2008 [facility] Patient Information 2013 [place], [place].

## 2012-06-26 ENCOUNTER — Telehealth: Payer: Self-pay | Admitting: Family Medicine

## 2012-06-26 NOTE — Telephone Encounter (Signed)
The patient called and left a message on my voicemail.  She has finished all of her antibiotics and is about done with the cream.  She still continues to have problems.  Rash is still there.  Not as bad as it was.  She would like to know what she can do next.  Please advise.  Thanks!!!

## 2012-06-29 ENCOUNTER — Other Ambulatory Visit: Payer: Self-pay | Admitting: Family Medicine

## 2012-06-29 MED ORDER — TERCONAZOLE 0.4 % VA CREA: 1.0000 | TOPICAL_CREAM | Freq: Every day | VAGINAL | Status: DC

## 2012-06-29 NOTE — Telephone Encounter (Signed)
We can either look at it again or consider getting  A dermatologist to see this.  If she thinks the cream helped would refill this  Also.

## 2012-06-29 NOTE — Telephone Encounter (Signed)
Spoke to the pt.  She will think about what she wants to do and call me back next week.  I have filled her cream for her.  Waiting on her to reply.

## 2012-07-03 ENCOUNTER — Other Ambulatory Visit: Payer: Self-pay | Admitting: Family Medicine

## 2012-07-03 DIAGNOSIS — L293 Anogenital pruritus, unspecified: Secondary | ICD-10-CM

## 2012-07-03 DIAGNOSIS — N760 Acute vaginitis: Secondary | ICD-10-CM

## 2012-07-04 ENCOUNTER — Encounter: Payer: Self-pay | Admitting: Internal Medicine

## 2012-07-17 ENCOUNTER — Telehealth: Payer: Self-pay | Admitting: Family Medicine

## 2012-07-17 NOTE — Telephone Encounter (Signed)
Ok to refill x 1  

## 2012-07-17 NOTE — Telephone Encounter (Signed)
The patient called to report that she did see the gynecologist.  She has a bacterial infection.  She would like a refill of her alprazolam 0.25mg .  Last filled on 05/30/12 #30 with 0 additional refills.  Last seen on 06/15/12.  She has no follow up scheduled.  Please advise.  Thanks!

## 2012-07-18 ENCOUNTER — Other Ambulatory Visit: Payer: Self-pay | Admitting: Internal Medicine

## 2012-07-18 MED ORDER — ALPRAZOLAM 0.25 MG PO TABS: 0.2500 mg | ORAL_TABLET | Freq: Three times a day (TID) | ORAL | Status: DC | PRN

## 2012-07-18 NOTE — Telephone Encounter (Signed)
Called to [facility] and left on voicemail.

## 2012-08-27 ENCOUNTER — Encounter: Payer: Self-pay | Admitting: Family Medicine

## 2012-08-27 ENCOUNTER — Telehealth: Payer: Self-pay | Admitting: Family Medicine

## 2012-08-27 NOTE — Telephone Encounter (Signed)
Last filled: 07/18/12 #30 with 0 additional refills Last seen: 06/15/12 Acute No follow up scheduled Please advise.  Thanks!!

## 2012-08-28 NOTE — Telephone Encounter (Signed)
Ok x 1

## 2012-08-29 ENCOUNTER — Other Ambulatory Visit: Payer: Self-pay | Admitting: Internal Medicine

## 2012-08-29 MED ORDER — ALPRAZOLAM 0.25 MG PO TABS: 0.2500 mg | ORAL_TABLET | Freq: Three times a day (TID) | ORAL | Status: DC | PRN

## 2012-08-29 NOTE — Telephone Encounter (Signed)
Called and left on voicemail. 

## 2012-11-14 ENCOUNTER — Telehealth: Payer: Self-pay | Admitting: Family Medicine

## 2012-11-14 MED ORDER — ALPRAZOLAM 0.25 MG PO TABS: 0.2500 mg | ORAL_TABLET | Freq: Three times a day (TID) | ORAL | Status: DC | PRN

## 2012-11-14 NOTE — Telephone Encounter (Signed)
Last filled on 08/29/12 #30 with 0 additional refills She has no future appointment scheduled Last seen acutely on 06/15/12 Please advise Thanks

## 2012-11-14 NOTE — Telephone Encounter (Signed)
I called in script 

## 2012-11-14 NOTE — Addendum Note (Signed)
Addended by: Aniceto Boss A on: 11/14/2012 03:31 PM   Modules accepted: Orders

## 2012-11-14 NOTE — Telephone Encounter (Signed)
Ok x 1

## 2012-12-24 ENCOUNTER — Telehealth: Payer: Self-pay | Admitting: Family Medicine

## 2012-12-24 NOTE — Telephone Encounter (Signed)
Last filled on 11/14/12 #30 with 0 additional refills Seen acutely on 06/15/12 No future follow up. Please advise. Thanks!!

## 2012-12-24 NOTE — Telephone Encounter (Signed)
Ok x 1   She needs  OV before the end of the year .

## 2012-12-25 ENCOUNTER — Other Ambulatory Visit: Payer: Self-pay | Admitting: Family Medicine

## 2012-12-25 MED ORDER — ALPRAZOLAM 0.25 MG PO TABS: 0.2500 mg | ORAL_TABLET | Freq: Three times a day (TID) | ORAL | Status: DC | PRN

## 2012-12-25 NOTE — Telephone Encounter (Signed)
Xanax called to the pharmacy.  Please make appt with the pt to be seen before this year is over.  Thanks!!

## 2013-01-25 ENCOUNTER — Telehealth: Payer: Self-pay | Admitting: Family Medicine

## 2013-01-25 NOTE — Telephone Encounter (Signed)
Needs office visit by November  . Refill x 1

## 2013-01-25 NOTE — Telephone Encounter (Signed)
Last filled on 12/25/12 #30 with 0 additional refills Has no future appointment scheduled Last seen on 06/15/12. Please advise. Thanks!

## 2013-01-28 ENCOUNTER — Other Ambulatory Visit: Payer: Self-pay | Admitting: Family Medicine

## 2013-01-28 MED ORDER — ALPRAZOLAM 0.25 MG PO TABS: 0.2500 mg | ORAL_TABLET | Freq: Three times a day (TID) | ORAL | Status: DC | PRN

## 2013-01-28 NOTE — Telephone Encounter (Signed)
Called to the pharmacy and left on voicemail. 

## 2013-03-23 ENCOUNTER — Other Ambulatory Visit: Payer: Self-pay | Admitting: Internal Medicine

## 2013-03-25 NOTE — Telephone Encounter (Signed)
Please review and advise.  No upcoming appt.

## 2013-03-26 NOTE — Telephone Encounter (Signed)
Ok to refill enough for 90 days  Yearly OV  Before runs out.  Or when she gets insurance

## 2013-03-28 ENCOUNTER — Other Ambulatory Visit: Payer: Self-pay | Admitting: Internal Medicine

## 2013-04-29 ENCOUNTER — Other Ambulatory Visit: Payer: Self-pay | Admitting: Internal Medicine

## 2013-04-29 NOTE — Telephone Encounter (Signed)
Please see  My notations  Last 3 refill requests to make appt.  . No appts  On schedule for med check .    Have her make appt   For the next 2 weeks or so  Can rx 15 pills in the meantime.

## 2013-04-29 NOTE — Telephone Encounter (Signed)
Last filled on 03/28/13 #30 with 0 additional refills At this time has no future appointment Last seen acutely on 06/15/12 Please advise.  Thanks!

## 2013-04-30 ENCOUNTER — Telehealth: Payer: Self-pay | Admitting: Family Medicine

## 2013-04-30 NOTE — Telephone Encounter (Signed)
Per [facility], she would like to see this patient in the office within 2 weeks.  Please call the patient and make an appt.  Thanks!

## 2013-04-30 NOTE — Telephone Encounter (Signed)
lmom for pt to call back

## 2013-04-30 NOTE — Telephone Encounter (Signed)
Pt returned called, appt scheduled for 05/13/13 @3 :15pm

## 2013-05-13 ENCOUNTER — Ambulatory Visit (INDEPENDENT_AMBULATORY_CARE_PROVIDER_SITE_OTHER): Payer: Self-pay | Admitting: Internal Medicine

## 2013-05-13 ENCOUNTER — Encounter: Payer: Self-pay | Admitting: Internal Medicine

## 2013-05-13 VITALS — BP 110/68 | HR 78 | Temp 97.9°F | Wt 162.0 lb

## 2013-05-13 DIAGNOSIS — F17200 Nicotine dependence, unspecified, uncomplicated: Secondary | ICD-10-CM

## 2013-05-13 DIAGNOSIS — Z5989 Other problems related to housing and economic circumstances: Secondary | ICD-10-CM

## 2013-05-13 DIAGNOSIS — I447 Left bundle-branch block, unspecified: Secondary | ICD-10-CM

## 2013-05-13 DIAGNOSIS — F411 Generalized anxiety disorder: Secondary | ICD-10-CM

## 2013-05-13 DIAGNOSIS — Z5181 Encounter for therapeutic drug level monitoring: Secondary | ICD-10-CM

## 2013-05-13 DIAGNOSIS — Z79899 Other long term (current) drug therapy: Secondary | ICD-10-CM

## 2013-05-13 DIAGNOSIS — F4323 Adjustment disorder with mixed anxiety and depressed mood: Secondary | ICD-10-CM

## 2013-05-13 DIAGNOSIS — M2550 Pain in unspecified joint: Secondary | ICD-10-CM

## 2013-05-13 DIAGNOSIS — Z598 Other problems related to housing and economic circumstances: Secondary | ICD-10-CM

## 2013-05-13 MED ORDER — ALPRAZOLAM 0.25 MG PO TABS: ORAL_TABLET | ORAL | Status: DC

## 2013-05-13 NOTE — Progress Notes (Signed)
Chief Complaint  Patient presents with  . Medication Management    HPI:  Patient comes in for followup visit for a number of issues. She still does not have regular health insurance was on the phone for quite a while to try to sign out for the healthcare exchange but had to hang up been unable to get a contact to sign up although she feels she may be eligible  Works 40 - 60 hours per week works 2 jobs  Due  For med evaluation see notes    Taking x anax regular;y  a few times a week. Her habits every other day Occasionally more often gets regular refill is because she doesn't want to be without intake she gets a panic attack. Sibling has severe panic disorder to the point where she stays at home is afraid to go out of her house takes high doses of medications and still is anxious.  MOOD: Is in so much depressed at this time asks and needs to stay on Wellbutrin and Lexapro  TOB hard to stop. Now about 4-5 per day. His questions about tobacco cessation  ETOH  :ocass   Palpitations ; Takes  As needed xanax. Not that often    ROS: See pertinent positives and negatives per HPI. No current chest pain shortness of breath some weight gain over the last year uses weekly to monthly borax vaginal capsules for her recurrent vaginitis. Takes 2 Aleve every day for her arthritis pain. Hasn't had blood work done in a year or more.  Past Medical History  Diagnosis Date  . Arthritis   . Thyroid disease in pregnancy   . Childhood asthma   . Hx of varicella   . History of jaundice     as a child    . Depression     related to husband illness and death    Family History  Problem Relation Age of Onset  . Alcohol abuse Mother     died from stomach ulcers  . Stroke Father     died  from cva and mi  . Hypertension      parent  . Hyperlipidemia      parent  . Coronary artery disease Father     Age 8    History   Social History  . Marital Status: Married    Spouse Name: N/A    Number of  Children: 3  . Years of Education: N/A   Occupational History  .      Catering and cleaning   Social History Main Topics  . Smoking status: Current Some Day Smoker -- 0.50 packs/day for 40 years    Types: Cigarettes  . Smokeless tobacco: None     Comment: about 5 cigarettes a day per pt.   . Alcohol Use: Yes     Comment: socially  . Drug Use: No  . Sexual Activity: None   Other Topics Concern  . None   Social History Narrative   Husband passed away on 22-Feb-2008 hep c and cirrhosis.   She is hep c negative 2006   G3P3   HH of 2-3  son no pets    No falls .  Has smoke detector and wears seat belts. POsitive  firearms. No excess sun exposure.    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   Currently no insuranced and cannot affort 800 per month for catastrophic    Still smokes some. Daily.  ocass etoh.  No rec drugs.   Daughter patient in our practice   Valerie Love    Outpatient Encounter Prescriptions as of 05/13/2013  Medication Sig  . ALPRAZolam (XANAX) 0.25 MG tablet TAKE ONE TABLET BY MOUTH THREE TIMES DAILY AS NEEDED  . Boric Acid CRYS Place vaginally.  Marland Kitchen. buPROPion (WELLBUTRIN XL) 150 MG 24 hr tablet Take one tablet by mouth one time daily  . escitalopram (LEXAPRO) 10 MG tablet Take one tablet by mouth one time daily  . Naproxen Sodium (ALEVE) 220 MG CAPS Take by mouth as directed.   . [DISCONTINUED] ALPRAZolam (XANAX) 0.25 MG tablet TAKE ONE TABLET BY MOUTH THREE TIMES DAILY AS NEEDED   . metroNIDAZOLE (FLAGYL) 500 MG tablet Take 1 tablet (500 mg total) by mouth 2 (two) times daily.  Marland Kitchen. terconazole (TERAZOL 7) 0.4 % vaginal cream Place 1 applicator vaginally at bedtime.    EXAM:  BP 110/68  Pulse 78  Temp(Src) 97.9 F (36.6 C) (Oral)  Wt 162 lb (73.483 kg)  SpO2 98%  Body mass index is 30.63 kg/(m^2).  GENERAL: vitals reviewed and listed above, alert, oriented, appears well hydrated and in no acute distress HEENT: atraumatic, conjunctiva   clear, no obvious abnormalities on inspection of external nose and ears TMs are clear OP : no lesion edema or exudate  NECK: no obvious masses on inspection palpation no adenopathy or bruit noted LUNGS: Bilateral breath sounds and occasional rare wheeze good air movement no respiratory distress CV: HRRR, no clubbing cyanosis or  peripheral edema nl cap refill no murmur or gallop is heard Abdomen soft without hepatomegaly guarding or rebound MS: moves all extremities without noticeable focal  abnormality some OA changes PSYCH: pleasant and cooperative, no obvious depression or anxiety no tremor. ASSESSMENT AND PLAN:  Discussed the following assessment and plan:  Anxiety state, unspecified - hx of  panic ;strong family history discussed risk-benefit limitations of benzos refill at this time monitor contract to be sign - Plan: Basic metabolic panel, AST, ALT  Adjustment reaction with anxiety and depression - Pretty stable at this point can try weaning Wellbutrin - Plan: Basic metabolic panel, AST, ALT  Medication monitoring encounter - Plan: Basic metabolic panel, AST, ALT  TOBACCO DEPENDENCE - Counseled about Chantix nicotine patch other options. May try nicotine replacement. - Plan: Basic metabolic panel, AST, ALT  LBBB (left bundle branch block) - Plan: Basic metabolic panel, AST, ALT  Medication management - Plan: Basic metabolic panel, AST, ALT  ARTHRALGIA, joint pains - Multiple joint pains presumed osteoarthritis taking daily NSAIDs liver renal monitor - Plan: Basic metabolic panel, AST, ALT  Does not have health insurance Smoking cessation counseling. Ensure ventilation and avoid strong chemicals and cleaners for her lung health. Would still attempt to sign up for health care to changes associated doesn't get boxed out of any health insurance. Refill medication today stay on controller Lexapro. -Patient advised to return or notify health care team  if symptoms worsen or persist or  new concerns arise. Disc contract and testing at no charge to her.  Patient Instructions  Can try to wean   Bupropion.  saty on the lexapro .  No alcohol with the xanax,   Can try nicotine replacement  gume patches also chantix can be used but is costly usually.  Yearly check      Neta MendsWanda K. Sandrea Boer M.D.

## 2013-05-13 NOTE — Patient Instructions (Addendum)
Can try to wean   Bupropion.  saty on the lexapro .  No alcohol with the xanax,   Can try nicotine replacement  gume patches also chantix can be used but is costly usually.  Yearly check

## 2013-05-13 NOTE — Progress Notes (Signed)
Pre visit review using our clinic review tool, if applicable. No additional management support is needed unless otherwise documented below in the visit note. 

## 2013-05-14 LAB — BASIC METABOLIC PANEL
BUN: 16 mg/dL (ref 6–23)
CO2: 32 meq/L (ref 19–32)
CREATININE: 1 mg/dL (ref 0.4–1.2)
Calcium: 9.3 mg/dL (ref 8.4–10.5)
Chloride: 103 mEq/L (ref 96–112)
GFR: 64.27 mL/min (ref >60.00)
Glucose, Bld: 88 mg/dL (ref 70–99)
Potassium: 4.1 mEq/L (ref 3.5–5.1)
Sodium: 142 mEq/L (ref 135–145)

## 2013-05-14 LAB — ALT: ALT: 19 U/L (ref 0–35)

## 2013-05-14 LAB — AST: AST: 22 U/L (ref 0–37)

## 2013-05-20 ENCOUNTER — Encounter: Payer: Self-pay | Admitting: Family Medicine

## 2013-06-03 ENCOUNTER — Telehealth: Payer: Self-pay | Admitting: Family Medicine

## 2013-06-03 NOTE — Telephone Encounter (Signed)
Patient called and left a voicemail on my machine.  She would like to get her lab results from 05/13/2013.  I sent a letter on 05/20/13 informing the patient of normal results.  Tried to call the patient back.  Left a message at the below listed number.

## 2013-06-04 NOTE — Telephone Encounter (Signed)
Pt left a voicemail on my machine stating that she received her results in the mail.  No other contact needed at this time.

## 2013-06-04 NOTE — Telephone Encounter (Signed)
Left message at below listed number for the pt to return my call. 

## 2013-06-11 ENCOUNTER — Ambulatory Visit (INDEPENDENT_AMBULATORY_CARE_PROVIDER_SITE_OTHER): Payer: 59 | Admitting: Family Medicine

## 2013-06-11 ENCOUNTER — Ambulatory Visit (INDEPENDENT_AMBULATORY_CARE_PROVIDER_SITE_OTHER)
Admission: RE | Admit: 2013-06-11 | Discharge: 2013-06-11 | Disposition: A | Discharge status: Home / Self Care | Payer: 59 | Source: Ambulatory Visit | Attending: Family Medicine | Admitting: Family Medicine

## 2013-06-11 ENCOUNTER — Encounter: Payer: Self-pay | Admitting: Family Medicine

## 2013-06-11 VITALS — BP 114/78 | HR 68 | Temp 98.9°F | Ht 61.0 in | Wt 156.0 lb

## 2013-06-11 DIAGNOSIS — R059 Cough, unspecified: Secondary | ICD-10-CM

## 2013-06-11 DIAGNOSIS — R05 Cough: Secondary | ICD-10-CM

## 2013-06-11 DIAGNOSIS — J209 Acute bronchitis, unspecified: Secondary | ICD-10-CM

## 2013-06-11 MED ORDER — AZITHROMYCIN 250 MG PO TABS: ORAL_TABLET | ORAL | Status: DC

## 2013-06-11 NOTE — Progress Notes (Signed)
Pre visit review using our clinic review tool, if applicable. No additional management support is needed unless otherwise documented below in the visit note. 

## 2013-06-11 NOTE — Progress Notes (Signed)
   Subjective:    Patient ID: Valerie Love, female    DOB: 1956/04/17, 58 y.o.   MRN: [id number]  HPI Here for 6 weeks of stuffy head, PND, right ear pain, chest congestion and coughing up yellow sputum. No fever. Of note she is a long time smoker and has not had any type of chest Xrays for many years. She is happy to note that she finally got medical insurance as of yesterday.    Review of Systems  Constitutional: Negative.   HENT: Positive for congestion, postnasal drip and sinus pressure.   Eyes: Negative.   Respiratory: Positive for cough and wheezing.   Cardiovascular: Negative.        Objective:   Physical Exam  Constitutional: She appears well-developed and well-nourished.  HENT:  Right Ear: External ear normal.  Left Ear: External ear normal.  Nose: Nose normal.  Mouth/Throat: Oropharynx is clear and moist.  Eyes: Conjunctivae are normal.  Neck: No thyromegaly present.  Pulmonary/Chest: Effort normal. No respiratory distress. She has no rales.  Scattered rhonchi and wheezes. The right upper lobe has especially loud wheezes   Lymphadenopathy:    She has no cervical adenopathy.          Assessment & Plan:  Treat the bronchitis with a Zpack and Mucinex. Get a CXR today

## 2013-07-03 ENCOUNTER — Other Ambulatory Visit: Payer: Self-pay | Admitting: Internal Medicine

## 2013-07-11 ENCOUNTER — Other Ambulatory Visit: Payer: Self-pay | Admitting: Internal Medicine

## 2013-07-11 NOTE — Telephone Encounter (Signed)
Ok to refill x 2  

## 2013-07-11 NOTE — Telephone Encounter (Signed)
Spoke to [employer] Scientist, research (physical sciences)) at [employer].  The pt does not have any further refills on file. Last seen and filled on 05/13/2013 #30 with 2 additional refills Currently does not have a future appointment. Please advise.  Thanks!

## 2013-07-12 ENCOUNTER — Telehealth: Payer: Self-pay | Admitting: Family Medicine

## 2013-07-12 NOTE — Telephone Encounter (Signed)
Pt called and left a message on my machine.  Believes she has a vaginal infection.  Tried returning the pt's call.  Left a message on voicemail.  Will wait on a return call.

## 2013-07-12 NOTE — Telephone Encounter (Signed)
Was able to speak with the pt.  Advised that she see the gynecologist.  Was referred for her condition by [facility].  Informed her that [facility] is not in the office today or Monday.  She will call the gynecologist for an appt.

## 2013-07-12 NOTE — Telephone Encounter (Signed)
Patient returned by call and left a message on my machine.  Tried calling the pt.  Received machine and left a message.

## 2013-08-01 ENCOUNTER — Encounter: Payer: Self-pay | Admitting: Internal Medicine

## 2013-09-03 ENCOUNTER — Other Ambulatory Visit: Payer: Self-pay | Admitting: Internal Medicine

## 2013-09-03 NOTE — Telephone Encounter (Signed)
Last filled on 07/11/13 #30 with 1 additional refill Has no future appointment at this time. Last seen by [facility] on 05/13/13 Please advise.  Thanks!

## 2013-09-03 NOTE — Telephone Encounter (Signed)
Ok x 1 30  with one additional refill

## 2013-10-07 ENCOUNTER — Other Ambulatory Visit: Payer: Self-pay | Admitting: Internal Medicine

## 2013-10-10 NOTE — Telephone Encounter (Signed)
Refill x 1  Avoid regular use

## 2013-10-11 NOTE — Telephone Encounter (Signed)
Called to the pharmacy and left on voicemail. 

## 2013-10-28 ENCOUNTER — Other Ambulatory Visit: Payer: Self-pay | Admitting: Internal Medicine

## 2013-10-28 NOTE — Telephone Encounter (Signed)
Filled on 10/11/13 #30 with 0 additional refills Last seen on 05/13/13 No future appt scheduled. Please advise.  Thanks!

## 2013-10-29 NOTE — Telephone Encounter (Signed)
Refill x1 

## 2013-10-31 NOTE — Telephone Encounter (Signed)
Called to the pharmacy and left on voicemail. 

## 2013-11-12 ENCOUNTER — Other Ambulatory Visit: Payer: Self-pay | Admitting: Internal Medicine

## 2013-11-12 NOTE — Telephone Encounter (Signed)
Patient would like more than one refill at a time.  Please advise.  Thanks!

## 2013-11-14 NOTE — Telephone Encounter (Signed)
You authorized #30 last refill.  Pt takes 3 x daily. Okay to fill #90 with additional refills?

## 2013-11-14 NOTE — Telephone Encounter (Signed)
Ok x 1  ? i though i had just done this  Please check on if this was jsut refilled

## 2013-11-17 NOTE — Telephone Encounter (Signed)
I want her to avoid taking 3 per day.  Only for break through anxiety panic     I feel comfortable rx 30 with 2 refills   If continue to be a problem  And not controlled with lexapro and or wellbutrin then make OV to discuss and consider medication change etc.  Consider increase lexapro dosing to 15 or 20 mg

## 2013-11-18 NOTE — Telephone Encounter (Signed)
Left a message for the pt to return my call.  

## 2013-11-18 NOTE — Telephone Encounter (Signed)
Called to the pharmacy and left on voicemail. 

## 2013-12-29 ENCOUNTER — Other Ambulatory Visit: Payer: Self-pay | Admitting: Internal Medicine

## 2013-12-30 NOTE — Telephone Encounter (Signed)
Sent to the pharmacy by e-scribe. 

## 2014-04-22 ENCOUNTER — Ambulatory Visit (INDEPENDENT_AMBULATORY_CARE_PROVIDER_SITE_OTHER): Payer: 59 | Admitting: Family Medicine

## 2014-04-22 ENCOUNTER — Encounter: Payer: Self-pay | Admitting: Family Medicine

## 2014-04-22 VITALS — BP 128/82 | HR 67 | Temp 98.0°F | Ht 61.0 in | Wt 160.3 lb

## 2014-04-22 DIAGNOSIS — J069 Acute upper respiratory infection, unspecified: Secondary | ICD-10-CM

## 2014-04-22 MED ORDER — BENZONATATE 100 MG PO CAPS
100.0000 mg | ORAL_CAPSULE | Freq: Two times a day (BID) | ORAL | Status: DC | PRN
Start: 2014-04-22 — End: 2014-04-22

## 2014-04-22 MED ORDER — BENZONATATE 100 MG PO CAPS: 100.0000 mg | ORAL_CAPSULE | Freq: Two times a day (BID) | ORAL | Status: DC | PRN

## 2014-04-22 MED ORDER — DOXYCYCLINE HYCLATE 100 MG PO TABS: 100.0000 mg | ORAL_TABLET | Freq: Two times a day (BID) | ORAL | Status: DC

## 2014-04-22 NOTE — Addendum Note (Signed)
Addended by: Sallee Lange A on: 04/22/2014 10:14 AM   Modules accepted: Orders

## 2014-04-22 NOTE — Patient Instructions (Signed)
INSTRUCTIONS FOR UPPER RESPIRATORY INFECTION:  -plenty of rest and fluids  -nasal saline wash 2-3 times daily (use prepackaged nasal saline or bottled/distilled water if making your own)   -can use AFRIN nasal spray for drainage and nasal congestion - but do NOT use longer then 3-4 days  -can use tylenol or ibuprofen as directed for aches and sorethroat  -in the winter time, using a humidifier at night is helpful (please follow cleaning instructions)  -if you are taking a cough medication - use only as directed, may also try a teaspoon of honey to coat the throat and throat lozenges  -for sore throat, salt water gargles can help  -if not getting better in the next few days or worsening start and complete the abx  -follow up if you have fevers, facial pain, tooth pain, difficulty breathing or are worsening or not getting better in 5-7 days

## 2014-04-22 NOTE — Progress Notes (Signed)
Pre visit review using our clinic review tool, if applicable. No additional management support is needed unless otherwise documented below in the visit note. 

## 2014-04-22 NOTE — Progress Notes (Signed)
HPI:  -started: 1-2 weeks ago -symptoms:nasal congestion, sore throat, cough, HA, laryngitis, a litte sinus pain, nausea yesterday from coughing  -denies:fever, SOB, NVD, tooth pain -has tried: musinex -sick contacts/travel/risks: denies flu exposure, tick exposure or or Ebola risks -Hx of: sinusitis and bronchitis and is sure this is what she has  ROS: See pertinent positives and negatives per HPI.  Past Medical History  Diagnosis Date  . Arthritis   . Thyroid disease in pregnancy   . Childhood asthma   . Hx of varicella   . History of jaundice     as a child    . Depression     related to husband illness and death    Past Surgical History  Procedure Laterality Date  . Tubal ligation    . Breast biopsy  80  . Tonsillectomy and adenoidectomy  1073  . Knee surgery      right duda   . Cyst removed from ovary      Family History  Problem Relation Age of Onset  . Alcohol abuse Mother     died from stomach ulcers  . Stroke Father     died  from cva and mi  . Hypertension      parent  . Hyperlipidemia      parent  . Coronary artery disease Father     Age 91    History   Social History  . Marital Status: Married    Spouse Name: N/A    Number of Children: 3  . Years of Education: N/A   Occupational History  .      Catering and cleaning   Social History Main Topics  . Smoking status: Current Some Day Smoker -- 0.50 packs/day for 40 years    Types: Cigarettes  . Smokeless tobacco: Never Used     Comment: none in about 1 week  . Alcohol Use: Yes     Comment: socially  . Drug Use: No  . Sexual Activity: None   Other Topics Concern  . None   Social History Narrative   Husband passed away on 2008-02-25 hep c and cirrhosis.   She is hep c negative 2006   G3P3   HH of 2-3  son no pets    No falls .  Has smoke detector and wears seat belts. POsitive  firearms. No excess sun exposure.    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   Currently no insuranced and cannot affort 800 per month for catastrophic    Still smokes some. Daily. ocass etoh.  No rec drugs.   Daughter patient in our practice   [name]    Current outpatient prescriptions: ALPRAZolam (XANAX) 0.25 MG tablet, TAKE ONE TABLET BY MOUTH THREE TIMES DAILY AS NEEDED , Disp: 30 tablet, Rfl: 2;  escitalopram (LEXAPRO) 10 MG tablet, TAKE ONE TABLET BY MOUTH ONE TIME DAILY , Disp: 30 tablet, Rfl: 4;  Naproxen Sodium (ALEVE) 220 MG CAPS, Take by mouth as directed. , Disp: , Rfl: ;  terconazole (TERAZOL 7) 0.4 % vaginal cream, Place 1 applicator vaginally at bedtime., Disp: 45 g, Rfl: 0 benzonatate (TESSALON) 100 MG capsule, Take 1 capsule (100 mg total) by mouth 2 (two) times daily as needed for cough., Disp: 20 capsule, Rfl: 0;  doxycycline (VIBRA-TABS) 100 MG tablet, Take 1 tablet (100 mg total) by mouth 2 (two) times daily., Disp: 20 tablet, Rfl: 0  EXAM:  Filed Vitals:  04/22/14 0917  BP: 128/82  Pulse: 67  Temp: 98 F (36.7 C)    Body mass index is 30.3 kg/(m^2).  GENERAL: vitals reviewed and listed above, alert, oriented, appears well hydrated and in no acute distress  HEENT: atraumatic, conjunttiva clear, no obvious abnormalities on inspection of external nose and ears, normal appearance of ear canals and TMs, thick nasal congestion, mild post oropharyngeal erythema with PND, no tonsillar edema or exudate, no sinus TTP  NECK: no obvious masses on inspection  LUNGS: clear to auscultation bilaterally, no wheezes, rales or rhonchi, good air movement  CV: HRRR, no peripheral edema  MS: moves all extremities without noticeable abnormality  PSYCH: pleasant and cooperative, no obvious depression or anxiety  ASSESSMENT AND PLAN:  Discussed the following assessment and plan:  Acute upper respiratory infection - Plan: doxycycline (VIBRA-TABS) 100 MG tablet, benzonatate (TESSALON) 100 MG capsule  -given HPI and exam findings today, a serious infection  or illness is unlikely. We discussed potential etiologies, with VURI being most likely, and advised supportive care and monitoring. We discussed treatment side effects, likely course, antibiotic misuse, transmission, and signs of developing a serious illness. -of course, we advised to return or notify a doctor immediately if symptoms worsen or persist or new concerns arise.    Patient Instructions  INSTRUCTIONS FOR UPPER RESPIRATORY INFECTION:  -plenty of rest and fluids  -nasal saline wash 2-3 times daily (use prepackaged nasal saline or bottled/distilled water if making your own)   -can use AFRIN nasal spray for drainage and nasal congestion - but do NOT use longer then 3-4 days  -can use tylenol or ibuprofen as directed for aches and sorethroat  -in the winter time, using a humidifier at night is helpful (please follow cleaning instructions)  -if you are taking a cough medication - use only as directed, may also try a teaspoon of honey to coat the throat and throat lozenges  -for sore throat, salt water gargles can help  -if not getting better in the next few days or worsening start and complete the abx  -follow up if you have fevers, facial pain, tooth pain, difficulty breathing or are worsening or not getting better in 5-7 days      Raeshawn Vo R.

## 2014-06-02 ENCOUNTER — Other Ambulatory Visit: Payer: Self-pay | Admitting: Internal Medicine

## 2014-06-03 ENCOUNTER — Other Ambulatory Visit: Payer: Self-pay | Admitting: *Deleted

## 2014-06-03 NOTE — Telephone Encounter (Signed)
Pt has appt on 10/15/14 for CPX.  Will send to [facility].

## 2014-06-05 NOTE — Telephone Encounter (Signed)
Ok to refil until her cpx

## 2014-06-06 NOTE — Telephone Encounter (Signed)
Sent to the pharmacy by e-scribe. 

## 2014-06-19 ENCOUNTER — Other Ambulatory Visit: Payer: Self-pay | Admitting: Internal Medicine

## 2014-06-20 NOTE — Telephone Encounter (Signed)
Ok to refill x 1  

## 2014-06-23 NOTE — Telephone Encounter (Signed)
Called to the pharmacy by e-scribe. 

## 2014-08-18 ENCOUNTER — Other Ambulatory Visit: Payer: Self-pay | Admitting: Internal Medicine

## 2014-08-19 ENCOUNTER — Telehealth: Payer: Self-pay | Admitting: Family Medicine

## 2014-08-19 NOTE — Telephone Encounter (Signed)
Called to the pharmacy by e-scribe. 

## 2014-08-19 NOTE — Telephone Encounter (Signed)
Has cpx appt. 10/15/2014 Ok to refill x 1

## 2014-08-19 NOTE — Telephone Encounter (Signed)
Patient left a message on my machine stating that she had questions about her physical in June.

## 2014-08-20 NOTE — Telephone Encounter (Signed)
Spoke to the pt.  She wanted to tell me that she had her physical and pap at the ob/gyn's office.  Wanted to know if she needed to have that done with Dr. Fabian Sharp.  Informed her that she does not need to have another pap or mammogram.  She did need her yearly lab work and visit with [facility].  Changed CPX appt to a 30 minute to go over lab results and medication.  Scheduled her for fasting lab work.  She will have all records sent from ob/gyn for [facility] to review.

## 2014-10-09 ENCOUNTER — Other Ambulatory Visit (INDEPENDENT_AMBULATORY_CARE_PROVIDER_SITE_OTHER): Payer: 59

## 2014-10-09 DIAGNOSIS — Z0000 Encounter for general adult medical examination without abnormal findings: Secondary | ICD-10-CM | POA: Diagnosis not present

## 2014-10-09 LAB — CBC WITH DIFFERENTIAL/PLATELET
BASOS ABS: 0 10*3/uL (ref 0.0–0.1)
Basophils Relative: 0.3 % (ref 0.0–3.0)
Eosinophils Absolute: 0.2 10*3/uL (ref 0.0–0.7)
Eosinophils Relative: 3.6 % (ref 0.0–5.0)
HEMATOCRIT: 40.8 % (ref 36.0–46.0)
Hemoglobin: 13.7 g/dL (ref 12.0–15.0)
Lymphocytes Relative: 32.4 % (ref 12.0–46.0)
Lymphs Abs: 1.8 10*3/uL (ref 0.7–4.0)
MCHC: 33.7 g/dL (ref 30.0–36.0)
MCV: 88.8 fl (ref 78.0–100.0)
MONO ABS: 0.5 10*3/uL (ref 0.1–1.0)
Monocytes Relative: 8.7 % (ref 3.0–12.0)
Neutro Abs: 3 10*3/uL (ref 1.4–7.7)
Neutrophils Relative %: 55 % (ref 43.0–77.0)
Platelets: 279 10*3/uL (ref 150.0–400.0)
RBC: 4.59 Mil/uL (ref 3.87–5.11)
RDW: 13.6 % (ref 11.5–15.5)
WBC: 5.5 10*3/uL (ref 4.0–10.5)

## 2014-10-09 LAB — COMPREHENSIVE METABOLIC PANEL
ALBUMIN: 4.2 g/dL (ref 3.5–5.2)
ALT: 20 U/L (ref 0–35)
AST: 21 U/L (ref 0–37)
Alkaline Phosphatase: 65 U/L (ref 39–117)
BUN: 20 mg/dL (ref 6–23)
CALCIUM: 9.1 mg/dL (ref 8.4–10.5)
CHLORIDE: 103 meq/L (ref 96–112)
CO2: 29 mEq/L (ref 19–32)
CREATININE: 0.65 mg/dL (ref 0.40–1.20)
GFR: 99.1 mL/min (ref >60.00)
GLUCOSE: 93 mg/dL (ref 70–99)
Potassium: 3.9 mEq/L (ref 3.5–5.1)
Sodium: 137 mEq/L (ref 135–145)
Total Bilirubin: 0.6 mg/dL (ref 0.2–1.2)
Total Protein: 6.8 g/dL (ref 6.0–8.3)

## 2014-10-09 LAB — LIPID PANEL
CHOLESTEROL: 201 mg/dL — AB (ref 0–200)
HDL: 64.8 mg/dL (ref >39.00)
LDL Cholesterol: 111 mg/dL — ABNORMAL HIGH (ref 0–99)
NonHDL: 136.2
TRIGLYCERIDES: 125 mg/dL (ref 0.0–149.0)
Total CHOL/HDL Ratio: 3
VLDL: 25 mg/dL (ref 0.0–40.0)

## 2014-10-09 LAB — TSH: TSH: 1.16 u[IU]/mL (ref 0.35–4.50)

## 2014-10-15 ENCOUNTER — Ambulatory Visit (INDEPENDENT_AMBULATORY_CARE_PROVIDER_SITE_OTHER): Payer: 59 | Admitting: Internal Medicine

## 2014-10-15 ENCOUNTER — Encounter: Payer: Self-pay | Admitting: Internal Medicine

## 2014-10-15 VITALS — BP 130/84 | Temp 98.2°F | Ht 61.0 in | Wt 164.9 lb

## 2014-10-15 DIAGNOSIS — K529 Noninfective gastroenteritis and colitis, unspecified: Secondary | ICD-10-CM

## 2014-10-15 DIAGNOSIS — Z79899 Other long term (current) drug therapy: Secondary | ICD-10-CM | POA: Diagnosis not present

## 2014-10-15 DIAGNOSIS — F17200 Nicotine dependence, unspecified, uncomplicated: Secondary | ICD-10-CM

## 2014-10-15 DIAGNOSIS — Z720 Tobacco use: Secondary | ICD-10-CM

## 2014-10-15 DIAGNOSIS — Z1211 Encounter for screening for malignant neoplasm of colon: Secondary | ICD-10-CM

## 2014-10-15 DIAGNOSIS — F4323 Adjustment disorder with mixed anxiety and depressed mood: Secondary | ICD-10-CM

## 2014-10-15 DIAGNOSIS — I447 Left bundle-branch block, unspecified: Secondary | ICD-10-CM

## 2014-10-15 DIAGNOSIS — R002 Palpitations: Secondary | ICD-10-CM

## 2014-10-15 DIAGNOSIS — M1990 Unspecified osteoarthritis, unspecified site: Secondary | ICD-10-CM

## 2014-10-15 MED ORDER — ALPRAZOLAM 0.5 MG PO TABS: ORAL_TABLET | ORAL | Status: DC

## 2014-10-15 MED ORDER — ESCITALOPRAM OXALATE 10 MG PO TABS: 10.0000 mg | ORAL_TABLET | Freq: Every day | ORAL | Status: DC

## 2014-10-15 NOTE — Patient Instructions (Addendum)
Will do referral for colon screen .    Healthy lifestyle includes : At least 150 minutes of exercise weeks  , weight at healthy levels, which is usually   BMI 19-25. Avoid trans fats and processed foods;  Increase fresh fruits and veges to 5 servings per day. And avoid sweet beverages including tea and juice. Mediterranean diet with olive oil and nuts have been noted to be heart and brain healthy . Avoid tobacco products . Limit  alcohol to  7 per week for women and 14 servings for men.  Get adequate sleep . Wear seat belts . Don't text and drive .    Exercises . May be helpful   For back and  Joints .   Contact  us if progressing and consider seeing  Rheumatology and or sports medicine   Limit alprazolam use  As is dependent producing and  don't take with etoh.  If palpitations  Progress  Or get  Faintly  feeling  Then need to see cardiology  .  Then   Consider getting back  As advised  Now that you have insurance .      Why follow it? Research shows. . Those who follow the Mediterranean diet have a reduced risk of heart disease  . The diet is associated with a reduced incidence of Parkinson's and Alzheimer's diseases . People following the diet may have longer life expectancies and lower rates of chronic diseases  . The Dietary Guidelines for Americans recommends the Mediterranean diet as an eating plan to promote health and prevent disease  What Is the Mediterranean Diet?  . Healthy eating plan based on typical foods and recipes of Mediterranean-style cooking . The diet is primarily a plant based diet; these foods should make up a majority of meals   Starches - Plant based foods should make up a majority of meals - They are an important sources of vitamins, minerals, energy, antioxidants, and fiber - Choose whole grains, foods high in fiber and minimally processed items  - Typical grain sources include wheat, oats, barley, corn, brown rice, bulgar, farro, millet, polenta,  couscous  - Various types of beans include chickpeas, lentils, fava beans, black beans, white beans   Fruits  Veggies - Large quantities of antioxidant rich fruits & veggies; 6 or more servings  - Vegetables can be eaten raw or lightly drizzled with oil and cooked  - Vegetables common to the traditional Mediterranean Diet include: artichokes, arugula, beets, broccoli, brussel sprouts, cabbage, carrots, celery, collard greens, cucumbers, eggplant, kale, leeks, lemons, lettuce, mushrooms, okra, onions, peas, peppers, potatoes, pumpkin, radishes, rutabaga, shallots, spinach, sweet potatoes, turnips, zucchini - Fruits common to the Mediterranean Diet include: apples, apricots, avocados, cherries, clementines, dates, figs, grapefruits, grapes, melons, nectarines, oranges, peaches, pears, pomegranates, strawberries, tangerines  Fats - Replace butter and margarine with healthy oils, such as olive oil, canola oil, and tahini  - Limit nuts to no more than a handful a day  - Nuts include walnuts, almonds, pecans, pistachios, pine nuts  - Limit or avoid candied, honey roasted or heavily salted nuts - Olives are central to the [employer] - can be eaten whole or used in a variety of dishes   Meats Protein - Limiting red meat: no more than a few times a month - When eating red meat: choose lean cuts and keep the portion to the size of deck of cards - Eggs: approx. 0 to 4 times a week  - Fish and  lean poultry: at least 2 a week  - Healthy protein sources include, chicken, [place], lean beef, lamb - Increase intake of seafood such as tuna, salmon, trout, mackerel, shrimp, scallops - Avoid or limit high fat processed meats such as sausage and bacon  Dairy - Include moderate amounts of low fat dairy products  - Focus on healthy dairy such as fat free yogurt, skim milk, low or reduced fat cheese - Limit dairy products higher in fat such as whole or 2% milk, cheese, ice cream  Alcohol - Moderate amounts of  red wine is ok  - No more than 5 oz daily for women (all ages) and men older than age 41  - No more than 10 oz of wine daily for men younger than 60  Other - Limit sweets and other desserts  - Use herbs and spices instead of salt to flavor foods  - Herbs and spices common to the traditional Mediterranean Diet include: basil, bay leaves, chives, cloves, cumin, fennel, garlic, lavender, marjoram, mint, oregano, parsley, pepper, rosemary, sage, savory, sumac, tarragon, thyme   It's not just a diet, it's a lifestyle:  . The Mediterranean diet includes lifestyle factors typical of those in the region  . Foods, drinks and meals are best eaten with others and savored . Daily physical activity is important for overall good health . This could be strenuous exercise like running and aerobics . This could also be more leisurely activities such as walking, housework, yard-work, or taking the stairs . Moderation is the key; a balanced and healthy diet accommodates most foods and drinks . Consider portion sizes and frequency of consumption of certain foods   Meal Ideas & Options:  . Breakfast:  o Whole wheat toast or whole wheat English muffins with peanut butter & hard boiled egg o Steel cut oats topped with apples & cinnamon and skim milk  o Fresh fruit: banana, strawberries, melon, berries, peaches  o Smoothies: strawberries, bananas, greek yogurt, peanut butter o Low fat greek yogurt with blueberries and granola  o Egg white omelet with spinach and mushrooms o Breakfast couscous: whole wheat couscous, apricots, skim milk, cranberries  . Sandwiches:  o Hummus and grilled vegetables (peppers, zucchini, squash) on whole wheat bread   o Grilled chicken on whole wheat pita with lettuce, tomatoes, cucumbers or tzatziki  o Tuna salad on whole wheat bread: tuna salad made with greek yogurt, olives, red peppers, capers, green onions o Garlic rosemary lamb pita: lamb sauted with garlic, rosemary, salt &  pepper; add lettuce, cucumber, greek yogurt to pita - flavor with lemon juice and black pepper  . Seafood:  o Mediterranean grilled salmon, seasoned with garlic, basil, parsley, lemon juice and black pepper o Shrimp, lemon, and spinach whole-grain pasta salad made with low fat greek yogurt  o Seared scallops with lemon orzo  o Seared tuna steaks seasoned salt, pepper, coriander topped with tomato mixture of olives, tomatoes, olive oil, minced garlic, parsley, green onions and cappers  . Meats:  o Herbed greek chicken salad with kalamata olives, cucumber, feta  o Red bell peppers stuffed with spinach, bulgur, lean ground beef (or lentils) & topped with feta   o Kebabs: skewers of chicken, tomatoes, onions, zucchini, squash  o [place] burgers: made with red onions, mint, dill, lemon juice, feta cheese topped with roasted red peppers . Vegetarian o Cucumber salad: cucumbers, artichoke hearts, celery, red onion, feta cheese, tossed in olive oil & lemon juice  o Hummus and whole  grain pita points with a greek salad (lettuce, tomato, feta, olives, cucumbers, red onion) o Lentil soup with celery, carrots made with vegetable broth, garlic, salt and pepper  o Tabouli salad: parsley, bulgur, mint, scallions, cucumbers, tomato, radishes, lemon juice, olive oil, salt and pepper.       Can try otc  Probiotic such as florastor  Every day  To see if frequent  Stools  Get back to baseline.

## 2014-10-15 NOTE — Progress Notes (Signed)
Pre visit review using our clinic review tool, if applicable. No additional management support is needed unless otherwise documented below in the visit note.  Chief Complaint  Patient presents with  . Follow-up    meds yearly update     HPI: Patient  Valerie Love  59 y.o. 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 Langston Masker  Has LBBB: Seen cardiology a few years ago had a stress test didn't go back because of no insurance no chest pain shortness of breath  Gets palpitations that she thinks are from anxiety goes away with the Xanax not associated with syncope or other symptoms. They're short-lived and less frequent she does have 4 caffeine beverages a day    Joints hurts  and itchy  Stool 8-10 x per year  For a year .   Aleve   2 at night and in am . For joint pains hands and back better in the morning no a.m. stiffness usually at the knuckles and left thumb was told after bone density that she has scoliosis under fats the cause of her back pain sometimes radiation no weakness or falling.  Over the last year she tends to have frequent stools bowel urgency but no blood except with her hemorrhoids no weight loss. She hasn't had a colonoscopy wants [place] to do a referral for that.  Has some itching has had bacterial vaginal infections treated with stent biotics and topicals.  Health Maintenance  Topic Date Due  . COLONOSCOPY  08/03/2005  . HIV Screening  10/08/2015 (Originally 08/04/1970)  . INFLUENZA VACCINE  12/08/2014  . MAMMOGRAM  09/07/2015  . PAP SMEAR  09/06/2017  . TETANUS/TDAP  08/17/2020   Health Maintenance  Review LIFESTYLE:  Exercise:  Very active job bending left cleaning Tobacco/ETS: 1-2 per month .  caffiene   4 per day  Alcohol: wine 1-2  2-3 x per week.  Sugar beverages:  coffe water wine  Sleep: 2 jobs 4- 8  Drug use: no Colonoscopy: Has never had one PAP: utd  MAMMO: Up-to-date had one last May to have one this June done and the OB/GYN's office   ROS:  GEN/ HEENT: No fever, significant weight changes sweats headaches vision problems hearing changes, CV/ PULM; No chest pain shortness of breath cough, syncope,edema  change in exercise tolerance. GI /GU: No adominal pain, vomiting, change in bowel habits. No blood in the stool. No significant GU symptoms. SKIN/HEME: ,no acute skin rashes suspicious lesions or bleeding. No lymphadenopathy, nodules, masses.  NEURO/ PSYCH:  No neurologic signs such as weakness numbness. No depression anxiety. IMM/ Allergy: No unusual infections.  Allergy .   REST of 12 system review negative except as per HPI   Past Medical History  Diagnosis Date  . Arthritis   . Thyroid disease in pregnancy   . Childhood asthma   . Hx of varicella   . History of jaundice     as a child    . Depression     related to husband illness and death    Past Surgical History  Procedure Laterality  Date  . Tubal ligation    . Breast biopsy  80  . Tonsillectomy and adenoidectomy  1073  . Knee surgery      right duda   . Cyst removed from ovary      Family History  Problem Relation Age of Onset  . Alcohol abuse Mother     died from stomach ulcers  . Stroke Father     died  from cva and mi  . Hypertension      parent  . Hyperlipidemia      parent  . Coronary artery disease Father     Age 27    History   Social History  . Marital Status: Married    Spouse Name: N/A  . Number of Children: 3  . Years of Education: N/A   Occupational History  .      Catering and cleaning   Social History Main Topics  . Smoking status: Current Some Day Smoker --  0.50 packs/day for 40 years    Types: Cigarettes  . Smokeless tobacco: Never Used     Comment: none in about 1 week  . Alcohol Use: Yes     Comment: socially  . Drug Use: No  . Sexual Activity: Not on file   Other Topics Concern  . None   Social History Narrative   Husband passed away on 24-Feb-2008 hep c and cirrhosis.   She is hep c negative 2006   G3P3   HH of 2-3  son no pets    No falls .  Has smoke detector and wears seat belts. POsitive  firearms. No excess sun exposure.    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   Currently no insuranced and cannot affort 800 per month for catastrophic    Still smokes some. Daily. ocass etoh.  No rec drugs.   Daughter patient in our practice   [name]    Outpatient Prescriptions Prior to Visit  Medication Sig Dispense Refill  . Naproxen Sodium (ALEVE) 220 MG CAPS Take by mouth as directed.     Marland Kitchen ALPRAZolam (XANAX) 0.25 MG tablet TAKE ONE TABLET BY MOUTH THREE TIMES DAILY AS NEEDED  30 tablet 0  . ALPRAZolam (XANAX) 0.5 MG tablet TAKE ONE TABLET BY MOUTH THREE TIMES DAILY AS NEEDED 30 tablet 0  . escitalopram (LEXAPRO) 10 MG tablet TAKE ONE TABLET BY MOUTH ONE TIME DAILY  30 tablet 4  . benzonatate (TESSALON) 100 MG capsule Take 1 capsule (100 mg total) by mouth 2 (two) times daily as needed for cough. 20 capsule 0  . doxycycline (VIBRA-TABS) 100 MG tablet Take 1 tablet (100 mg total) by mouth 2 (two) times daily. 20 tablet 0  . terconazole (TERAZOL 7) 0.4 % vaginal cream Place 1 applicator vaginally at bedtime. 45 g 0   No facility-administered medications prior to visit.     EXAM:  BP 130/84 mmHg  Temp(Src) 98.2 F (36.8 C) (Oral)  Ht  (1.549 m)  Wt 164 lb 14.4 oz (74.798 kg)  BMI 31.17 kg/m2  Body mass index is 31.17 kg/(m^2).  Physical Exam: Vital signs reviewed [id number] is a well-developed well-nourished alert cooperative    who appearsr stated age in no acute distress.  HEENT: normocephalic  atraumatic , Eyes: PERRL EOM's full, conjunctiva clear, Nares: paten,t no deformity discharge or tenderness., Ears: no deformity EAC's clear TMs with normal landmarks. Mouth: clear OP, no lesions, edema.  Moist  mucous membranes. Dentition in adequate repair. NECK: supple without masses, thyromegaly or bruits. CHEST/PULM:  Clear to auscultation and percussion breath sounds equal no wheeze , rales or rhonchi. CV: PMI is nondisplaced, S1 S2 no gallops, murmurs, rubs. Peripheral pulses are full without delay.No JVD .  ABDOMEN: Bowel sounds normal nontender  No guard or rebound, no hepato splenomegal no CVA tenderness.   Extremtities:  No clubbing cyanosis or edema, no acute joint swelling or redness no focal atrophy OA-looking changes in the right knuckle index finger and left base of thumb but note acute swelling effusion or deformity. NEURO:  Oriented x3, cranial nerves 3-12 appear to be intact, no obvious focal weakness,gait within normal limits no abnormal reflexes or asymmetrical SKIN: No acute rashes normal turgor, color, no bruising or petechiae. PSYCH: Oriented, good eye contact, no obvious depression anxiety, cognition and judgment appear normal. LN: no cervical adenopathy  Lab Results  Component Value Date   WBC 5.5 10/09/2014   HGB 13.7 10/09/2014   HCT 40.8 10/09/2014   PLT 279.0 10/09/2014   GLUCOSE 93 10/09/2014   CHOL 201* 10/09/2014   TRIG 125.0 10/09/2014   HDL 64.80 10/09/2014   LDLCALC 111* 10/09/2014   ALT 20 10/09/2014   AST 21 10/09/2014   NA 137 10/09/2014   K 3.9 10/09/2014   CL 103 10/09/2014   CREATININE 0.65 10/09/2014   BUN 20 10/09/2014   CO2 29 10/09/2014   TSH 1.16 10/09/2014    ASSESSMENT AND PLAN:  Discussed the following assessment and plan:  Adjustment reaction with anxiety and depression  Medication management  LBBB (left bundle branch block)  TOBACCO DEPENDENCE  Arthritis - Probably osteoarthritis discussed exercises joint protection NSAID.  Can see rheumatologist for further definition if needed monitoring if on NSAIDs.  Rapid palpitations - Less frequent no associated symptoms thinks it's anxiety uses as needed Xanax  Colon cancer screening - Plan: Ambulatory referral to Gastroenterology  Frequent stools - for 1 year  no ass sx   Patient Care Team: Madelin Headings, MD as PCP - General (Internal Medicine) Lewayne Bunting, MD (Cardiology) Mitchel Honour, DO as Attending Physician (Obstetrics and Gynecology) Patient Instructions    Will do referral for colon screen .    Healthy lifestyle includes : At least 150 minutes of exercise weeks  , weight at healthy levels, which is usually   BMI 19-25. Avoid trans fats and processed foods;  Increase fresh fruits and veges to 5 servings per day. And avoid sweet beverages including tea and juice. Mediterranean diet with olive oil and nuts have been noted to be heart and brain healthy . Avoid tobacco products . Limit  alcohol to  7 per week for women and 14 servings for men.  Get adequate sleep . Wear seat belts . Don't text and drive .    Exercises . May be helpful   For back and  Joints .   Contact  us if progressing and consider seeing  Rheumatology and or sports medicine   Limit alprazolam use  As is dependent producing and  don't take with etoh.  If palpitations  Progress  Or get  Faintly  feeling  Then need to see cardiology  .  Then   Consider getting back  As advised  Now that you have insurance .      Why follow it? Research shows. . Those who follow the Mediterranean diet have a reduced risk of heart disease  . The diet is  associated with a reduced incidence of Parkinson's and Alzheimer's diseases . People following the diet may have longer life expectancies and lower rates of chronic diseases  . The Dietary Guidelines for Americans recommends the Mediterranean diet as an eating plan to promote health and prevent disease  What Is the Mediterranean Diet?   . Healthy eating plan based on typical foods and recipes of Mediterranean-style cooking . The diet is primarily a plant based diet; these foods should make up a majority of meals   Starches - Plant based foods should make up a majority of meals - They are an important sources of vitamins, minerals, energy, antioxidants, and fiber - Choose whole grains, foods high in fiber and minimally processed items  - Typical grain sources include wheat, oats, barley, corn, brown rice, bulgar, farro, millet, polenta, couscous  - Various types of beans include chickpeas, lentils, fava beans, black beans, white beans   Fruits  Veggies - Large quantities of antioxidant rich fruits & veggies; 6 or more servings  - Vegetables can be eaten raw or lightly drizzled with oil and cooked  - Vegetables common to the traditional Mediterranean Diet include: artichokes, arugula, beets, broccoli, brussel sprouts, cabbage, carrots, celery, collard greens, cucumbers, eggplant, kale, leeks, lemons, lettuce, mushrooms, okra, onions, peas, peppers, potatoes, pumpkin, radishes, rutabaga, shallots, spinach, sweet potatoes, turnips, zucchini - Fruits common to the Mediterranean Diet include: apples, apricots, avocados, cherries, clementines, dates, figs, grapefruits, grapes, melons, nectarines, oranges, peaches, pears, pomegranates, strawberries, tangerines  Fats - Replace butter and margarine with healthy oils, such as olive oil, canola oil, and tahini  - Limit nuts to no more than a handful a day  - Nuts include walnuts, almonds, pecans, pistachios, pine nuts  - Limit or avoid candied, honey roasted or heavily salted nuts - Olives are central to the [employer] - can be eaten whole or used in a variety of dishes   Meats Protein - Limiting red meat: no more than a few times a month - When eating red meat: choose lean cuts and keep the portion to the size of deck of cards - Eggs: approx. 0 to 4 times a week  - Fish and lean  poultry: at least 2 a week  - Healthy protein sources include, chicken, [place], lean beef, lamb - Increase intake of seafood such as tuna, salmon, trout, mackerel, shrimp, scallops - Avoid or limit high fat processed meats such as sausage and bacon  Dairy - Include moderate amounts of low fat dairy products  - Focus on healthy dairy such as fat free yogurt, skim milk, low or reduced fat cheese - Limit dairy products higher in fat such as whole or 2% milk, cheese, ice cream  Alcohol - Moderate amounts of red wine is ok  - No more than 5 oz daily for women (all ages) and men older than age 33  - No more than 10 oz of wine daily for men younger than 22  Other - Limit sweets and other desserts  - Use herbs and spices instead of salt to flavor foods  - Herbs and spices common to the traditional Mediterranean Diet include: basil, bay leaves, chives, cloves, cumin, fennel, garlic, lavender, marjoram, mint, oregano, parsley, pepper, rosemary, sage, savory, sumac, tarragon, thyme   It's not just a diet, it's a lifestyle:  . The Mediterranean diet includes lifestyle factors typical of those in the region  . Foods, drinks and meals are best eaten with others and savored . Daily  physical activity is important for overall good health . This could be strenuous exercise like running and aerobics . This could also be more leisurely activities such as walking, housework, yard-work, or taking the stairs . Moderation is the key; a balanced and healthy diet accommodates most foods and drinks . Consider portion sizes and frequency of consumption of certain foods   Meal Ideas & Options:  . Breakfast:  o Whole wheat toast or whole wheat English muffins with peanut butter & hard boiled egg o Steel cut oats topped with apples & cinnamon and skim milk  o Fresh fruit: banana, strawberries, melon, berries, peaches  o Smoothies: strawberries, bananas, greek yogurt, peanut butter o Low fat greek yogurt with  blueberries and granola  o Egg white omelet with spinach and mushrooms o Breakfast couscous: whole wheat couscous, apricots, skim milk, cranberries  . Sandwiches:  o Hummus and grilled vegetables (peppers, zucchini, squash) on whole wheat bread   o Grilled chicken on whole wheat pita with lettuce, tomatoes, cucumbers or tzatziki  o Tuna salad on whole wheat bread: tuna salad made with greek yogurt, olives, red peppers, capers, green onions o Garlic rosemary lamb pita: lamb sauted with garlic, rosemary, salt & pepper; add lettuce, cucumber, greek yogurt to pita - flavor with lemon juice and black pepper  . Seafood:  o Mediterranean grilled salmon, seasoned with garlic, basil, parsley, lemon juice and black pepper o Shrimp, lemon, and spinach whole-grain pasta salad made with low fat greek yogurt  o Seared scallops with lemon orzo  o Seared tuna steaks seasoned salt, pepper, coriander topped with tomato mixture of olives, tomatoes, olive oil, minced garlic, parsley, green onions and cappers  . Meats:  o Herbed greek chicken salad with kalamata olives, cucumber, feta  o Red bell peppers stuffed with spinach, bulgur, lean ground beef (or lentils) & topped with feta   o Kebabs: skewers of chicken, tomatoes, onions, zucchini, squash  o [place] burgers: made with red onions, mint, dill, lemon juice, feta cheese topped with roasted red peppers . Vegetarian o Cucumber salad: cucumbers, artichoke hearts, celery, red onion, feta cheese, tossed in olive oil & lemon juice  o Hummus and whole grain pita points with a greek salad (lettuce, tomato, feta, olives, cucumbers, red onion) o Lentil soup with celery, carrots made with vegetable broth, garlic, salt and pepper  o Tabouli salad: parsley, bulgur, mint, scallions, cucumbers, tomato, radishes, lemon juice, olive oil, salt and pepper.       Can try otc  Probiotic such as florastor  Every day  To see if frequent  Stools  Get back to baseline.      Neta Mends. Teagon Kron M.D.

## 2014-10-16 ENCOUNTER — Encounter: Payer: Self-pay | Admitting: Internal Medicine

## 2014-11-01 ENCOUNTER — Other Ambulatory Visit: Payer: Self-pay | Admitting: Internal Medicine

## 2014-11-03 NOTE — Telephone Encounter (Signed)
Denied.  Filled by another pharmacy for one year.

## 2014-12-23 ENCOUNTER — Telehealth: Payer: Self-pay | Admitting: Internal Medicine

## 2014-12-23 ENCOUNTER — Ambulatory Visit (AMBULATORY_SURGERY_CENTER): Payer: Self-pay

## 2014-12-23 VITALS — Ht 61.0 in | Wt 163.4 lb

## 2014-12-23 DIAGNOSIS — Z1211 Encounter for screening for malignant neoplasm of colon: Secondary | ICD-10-CM

## 2014-12-23 NOTE — Progress Notes (Signed)
Pt came into the office today for her pre-visit prior to her colonoscopy with Dr Leone Payor on 01/06/15.Pt states she has a vaginal/rectal infection (bacterial) and is having frequent loose stools.I called Dr Lilyan Punt office to get information regarding the infection. They stated the pt was using a cream for a vaginal infection, not rectal.Informed pt she needs to be evaluated in the office for the infection and diarrhea prior to  having a colonoscopy. Pt understood. An office visit was scheduled with Doug Sou on 12/29/14 to be evaluated. The colonocopy on 08/30 was not cancelled at this time.

## 2014-12-23 NOTE — Telephone Encounter (Signed)
Pt is at [facility] for her pre op, and they states  they do not have a referral. Gave referral autho number and they asked if you would not mind re-faxing referral info.

## 2014-12-24 NOTE — Telephone Encounter (Signed)
refaxed  Referral to [phone] [facility] Eligibility for: HOPIE MOOTHART    [id number]   [employer] authorization

## 2014-12-29 ENCOUNTER — Other Ambulatory Visit (INDEPENDENT_AMBULATORY_CARE_PROVIDER_SITE_OTHER): Payer: 59

## 2014-12-29 ENCOUNTER — Encounter: Payer: Self-pay | Admitting: Gastroenterology

## 2014-12-29 ENCOUNTER — Ambulatory Visit (INDEPENDENT_AMBULATORY_CARE_PROVIDER_SITE_OTHER): Payer: 59 | Admitting: Gastroenterology

## 2014-12-29 VITALS — BP 110/68 | HR 76 | Ht 61.25 in | Wt 163.2 lb

## 2014-12-29 DIAGNOSIS — R197 Diarrhea, unspecified: Secondary | ICD-10-CM

## 2014-12-29 DIAGNOSIS — Z1211 Encounter for screening for malignant neoplasm of colon: Secondary | ICD-10-CM

## 2014-12-29 LAB — IGA: IGA: 217 mg/dL (ref 68–378)

## 2014-12-29 NOTE — Progress Notes (Signed)
12/29/2014 Valerie Love [id number] 10/16/1955   HISTORY OF PRESENT ILLNESS:  This is a pleasant 59 year old female who is new to our practice.  She is already scheduled for a colonoscopy with Dr. Leone Payor next week, however, she was given this appointment to come and discuss her issues with diarrhea.  She's never undergone colonoscopy in the past.  She tells me that she's had some diarrhea for the past 9 years or so, intermittently.  Thought it was related to stress when her husband was sick and then passed away.  For the past year the diarrhea has been much worse, much more frequent.  Does not have diarrhea every day, but has diarrhea at least a few days every week.  Has a lot of urgency and has incontinence at times as well.  Is afraid to go places and eat because of fear of having diarrhea.  Has some abdominal cramping when diarrhea comes on but is relieved by BM.  A lot of bloating as well.  Has been treated for a lot of yeast infections and other vaginal infections (?BV) and says that sometimes when she has those she has burning and discomfort around her anus as well.  Is asking if they could be related.  Does not necessarily seem to be affected by certain foods.  CBC, CMP, TSH normal recently.     Past Medical History  Diagnosis Date  . Arthritis   . Thyroid disease in pregnancy   . Childhood asthma   . Hx of varicella   . History of jaundice     as a child    . Depression     related to husband illness and death  . Anxiety   . LBBB (left bundle branch block)    Past Surgical History  Procedure Laterality Date  . Tubal ligation    . Breast biopsy Left 80  . Tonsillectomy and adenoidectomy  1073  . Knee surgery      right duda   . Cyst removed from ovary      reports that she has been smoking Cigarettes.  She has a 20 pack-year smoking history. She has never used smokeless tobacco. She reports that she drinks alcohol. She reports that she does not use illicit drugs. family  history includes Alcohol abuse in her mother; Colon polyps in her sister; Coronary artery disease in her father; Hepatitis C in her brother; Hyperlipidemia in her father; Hypertension in her father; Stroke in her father. Allergies  Allergen Reactions  . Codeine Itching, Swelling and Rash      Outpatient Encounter Prescriptions as of 12/29/2014  Medication Sig  . ALPRAZolam (XANAX) 0.5 MG tablet 1 po as needed, maximum 3 x per day , avoid regular use: Do not take with alcohol  . calcium carbonate (OS-CAL) 600 MG TABS tablet Take 600 mg by mouth daily.  . Cholecalciferol (VITAMIN D3 PO) Take 800 mg by mouth daily.  Marland Kitchen escitalopram (LEXAPRO) 10 MG tablet Take 1 tablet (10 mg total) by mouth daily.  . Multiple Vitamin (MULTIVITAMIN) tablet Take 1 tablet by mouth daily. ALIVE  . Naproxen Sodium (ALEVE) 220 MG CAPS Take 2 capsules by mouth 2 (two) times daily.   . Probiotic Product (TRUBIOTICS PO) Take 1 tablet by mouth daily.   No facility-administered encounter medications on file as of 12/29/2014.     REVIEW OF SYSTEMS  : All other systems reviewed and negative except where noted in the History of Present Illness.  PHYSICAL EXAM: BP 110/68 mmHg  Pulse 76  Ht 5' 1.25" (1.556 m)  Wt 163 lb 4 oz (74.05 kg)  BMI 30.58 kg/m2 General: Well developed white female in no acute distress Head: Normocephalic and atraumatic Eyes:  Sclerae anicteric, conjunctiva pink. Ears: Normal auditory acuity Lungs: Clear throughout to auscultation Heart: Regular rate and rhythm Abdomen: Soft, non-distended.  Normal bowel sounds.  Non-tender. Rectal:  Will be done at the time of colonoscopy. Musculoskeletal: Symmetrical with no gross deformities  Skin: No lesions on visible extremities Extremities: No edema  Neurological: Alert oriented x 4, grossly non-focal Psychological:  Alert and cooperative. Normal mood and affect  ASSESSMENT AND PLAN: -Screening colonoscopy:  Never had one in the past.  Already  scheduled with Dr. Leone Payor for next week.  The risks, benefits, and alternatives were discussed with the patient and she consents to proceed.  -Diarrhea:  Chronic.  Colonoscopy to rule out IBD, also could consider random biopsies to rule to microscopic colitis.  Will check celiac labs.  Will try a lactose free diet.  May have IBS, but need to rule out these other issues as well.   CC:  Panosh, Neta Mends, MD

## 2014-12-29 NOTE — Progress Notes (Signed)
Agree with Ms. Zehr's management.  Nichole Neyer E. Malaiah Viramontes, MD, FACG  

## 2014-12-29 NOTE — Patient Instructions (Signed)
Your physician has requested that you go to the basement for the following lab work before leaving today:  TTG, IGA  Eat a lactose intolerant diet  You have been scheduled for a colonoscopy. Please follow written instructions given to you at your visit today.  Please pick up your prep supplies at the pharmacy within the next 1-3 days. If you use inhalers (even only as needed), please bring them with you on the day of your procedure.

## 2015-01-05 LAB — TISSUE TRANSGLUTAMINASE, IGG: Tissue Transglut Ab: 1 U/mL (ref <6)

## 2015-01-06 ENCOUNTER — Encounter: Payer: Self-pay | Admitting: Internal Medicine

## 2015-01-06 ENCOUNTER — Ambulatory Visit (AMBULATORY_SURGERY_CENTER): Payer: 59 | Admitting: Internal Medicine

## 2015-01-06 VITALS — BP 116/63 | HR 75 | Temp 98.4°F | Resp 20 | Ht 61.25 in | Wt 163.0 lb

## 2015-01-06 DIAGNOSIS — Z1211 Encounter for screening for malignant neoplasm of colon: Secondary | ICD-10-CM | POA: Diagnosis not present

## 2015-01-06 DIAGNOSIS — K633 Ulcer of intestine: Secondary | ICD-10-CM | POA: Diagnosis not present

## 2015-01-06 DIAGNOSIS — K5000 Crohn's disease of small intestine without complications: Secondary | ICD-10-CM | POA: Diagnosis not present

## 2015-01-06 MED ORDER — SODIUM CHLORIDE 0.9 % IV SOLN: 500.0000 mL | INTRAVENOUS | Status: DC

## 2015-01-06 NOTE — Patient Instructions (Addendum)
Rectal irritation can be calmed down with preparation H. If you have persistent problems let me know and we can band them.  I found a small ulcer in the intestine - probably from the prep. I biopsied it.  Keep avoiding dairy or limiting it and Lactaid may help. We can consider a treatment with Xifaxan  I appreciate the opportunity to care for you. Iva Boop, MD, FACG   YOU HAD AN ENDOSCOPIC PROCEDURE TODAY AT THE [facility]:   Refer to the procedure report that was given to you for any specific questions about what was found during the examination.  If the procedure report does not answer your questions, please call your gastroenterologist to clarify.  If you requested that your care partner not be given the details of your procedure findings, then the procedure report has been included in a sealed envelope for you to review at your convenience later.  YOU SHOULD EXPECT: Some feelings of bloating in the abdomen. Passage of more gas than usual.  Walking can help get rid of the air that was put into your GI tract during the procedure and reduce the bloating. If you had a lower endoscopy (such as a colonoscopy or flexible sigmoidoscopy) you may notice spotting of blood in your stool or on the toilet paper. If you underwent a bowel prep for your procedure, you may not have a normal bowel movement for a few days.  Please Note:  You might notice some irritation and congestion in your nose or some drainage.  This is from the oxygen used during your procedure.  There is no need for concern and it should clear up in a day or so.  SYMPTOMS TO REPORT IMMEDIATELY:   Following lower endoscopy (colonoscopy or flexible sigmoidoscopy):  Excessive amounts of blood in the stool  Significant tenderness or worsening of abdominal pains  Swelling of the abdomen that is new, acute  Fever of 100F or higher  For urgent or emergent issues, a gastroenterologist can be reached at any  hour by calling [phone].   DIET: Your first meal following the procedure should be a small meal and then it is ok to progress to your normal diet. Heavy or fried foods are harder to digest and may make you feel nauseous or bloated.  Likewise, meals heavy in dairy and vegetables can increase bloating.  Drink plenty of fluids but you should avoid alcoholic beverages for 24 hours.  ACTIVITY:  You should plan to take it easy for the rest of today and you should NOT DRIVE or use heavy machinery until tomorrow (because of the sedation medicines used during the test).    FOLLOW UP: Our staff will call the number listed on your records the next business day following your procedure to check on you and address any questions or concerns that you may have regarding the information given to you following your procedure. If we do not reach you, we will leave a message.  However, if you are feeling well and you are not experiencing any problems, there is no need to return our call.  We will assume that you have returned to your regular daily activities without incident.  If any biopsies were taken you will be contacted by phone or by letter within the next 1-3 weeks.  Please call us at [phone] if you have not heard about the biopsies in 3 weeks.    SIGNATURES/CONFIDENTIALITY: You and/or your care partner have signed  paperwork which will be entered into your electronic medical record.  These signatures attest to the fact that that the information above on your After Visit Summary has been reviewed and is understood.  Full responsibility of the confidentiality of this discharge information lies with you and/or your care-partner.  Next colonoscopy in 10 years. Await biopsy results.

## 2015-01-06 NOTE — Op Note (Signed)
[facility] [address] [place], [address]   COLONOSCOPY PROCEDURE REPORT  PATIENT: Valerie Love, Valerie Love  MR#: [id number] BIRTHDATE: 09/14/1955 , 59  yrs. old GENDER: female ENDOSCOPIST: Iva Boop, MD, [facility] PROCEDURE DATE:  01/06/2015 PROCEDURE:   Colonoscopy, screening and Colonoscopy with biopsy First Screening Colonoscopy - Avg.  risk and is 50 yrs.  old or older Yes.  Prior Negative Screening - Now for repeat screening. N/A      Polyps removed today? No Recommend repeat exam, <10 yrs? No ASA CLASS:   Class II INDICATIONS:Screening for colonic neoplasia and Colorectal Neoplasm Risk Assessment for this procedure is average risk. MEDICATIONS: Propofol 250 mg IV and Monitored anesthesia care  DESCRIPTION OF PROCEDURE:   After the risks benefits and alternatives of the procedure were thoroughly explained, informed consent was obtained.  The digital rectal exam revealed no abnormalities of the rectum.   The LB [id number]  endoscope was introduced through the anus and advanced to the terminal ileum which was intubated for a short distance. No adverse events experienced.   The quality of the prep was excellent.  (MiraLax was used)  The instrument was then slowly withdrawn as the colon was fully examined. Estimated blood loss is zero unless otherwise noted in this procedure report.   COLON FINDINGS: A small non-bleeding, clean-based, shallow and round ulcer was found in the terminal ileum.  Biopsies were taken at the center of the ulcer.   There was mild diverticulosis noted in the sigmoid colon.   The examination was otherwise normal.   Right colon retroflexion included.  Retroflexed views revealed no abnormalities. The time to cecum = 1.4 Withdrawal time = 10.1   The scope was withdrawn and the procedure completed. COMPLICATIONS: There were no immediate complications.  ENDOSCOPIC IMPRESSION: 1.   Small ulcer was found in the terminal ileum; biopsies  were taken 2.   There was mild diverticulosis noted in the sigmoid colon 3.   The examination was otherwise normal  RECOMMENDATIONS: 1.  Await pathology results 2.  Continue lactose free - consider Xifaxan for what I think is IBS.  Suspect incidental ulcer 3.  Repeat colonoscopy 10 years.  eSigned:  Iva Boop, MD, [place] 01/06/2015 8:35 AM   cc: The Patient  Berniece Andreas, MD

## 2015-01-06 NOTE — Progress Notes (Signed)
Called to room to assist during endoscopic procedure.  Patient ID and intended procedure confirmed with present staff. Received instructions for my participation in the procedure from the performing physician.  

## 2015-01-07 ENCOUNTER — Telehealth: Payer: Self-pay | Admitting: *Deleted

## 2015-01-07 NOTE — Telephone Encounter (Signed)
  Follow up Call-  Call back number 01/06/2015  Post procedure Call Back phone  # [phone]  Permission to leave phone message Yes     Patient questions:  Do you have a fever, pain , or abdominal swelling? No. Pain Score  0 *  Have you tolerated food without any problems? Yes.    Have you been able to return to your normal activities? Yes.    Do you have any questions about your discharge instructions: Diet   No. Medications  No. Follow up visit  No.  Do you have questions or concerns about your Care? No.  Actions: * If pain score is 4 or above: No action needed, pain <4.

## 2015-01-13 ENCOUNTER — Telehealth: Payer: Self-pay | Admitting: Internal Medicine

## 2015-01-13 ENCOUNTER — Encounter: Payer: Self-pay | Admitting: Internal Medicine

## 2015-01-13 NOTE — Progress Notes (Signed)
Quick Note:  Benign ileal ulcer - likely from prep - recall colon 2026 ______

## 2015-01-13 NOTE — Telephone Encounter (Signed)
Did you want to prescribe xifaxan.  Procedure report from 01/06/15 indicates you would consider.  Please advise

## 2015-01-14 MED ORDER — DICYCLOMINE HCL 10 MG PO CAPS: 10.0000 mg | ORAL_CAPSULE | Freq: Four times a day (QID) | ORAL | Status: DC | PRN

## 2015-01-14 NOTE — Telephone Encounter (Signed)
Dicyclomine 20 mg every 6 hrs prn cramps # 60 3 RF  May use 1/2 tab if gets sleepy or dry mouth with Rx

## 2015-01-14 NOTE — Telephone Encounter (Signed)
If she is still having diarrhea that is bothersome can Rx Xifaxan 550 mg tid x 14 d

## 2015-01-14 NOTE — Telephone Encounter (Signed)
rx sent Left message for patient to call back  

## 2015-01-14 NOTE — Telephone Encounter (Signed)
Patient reports diarrhea has resolved with lactose free diet.  She is asking for antispasmodic Doug Sou, PA mentioned to her at the office visit.  She reports still having cramping. She is asking for more information about lactose free diet.  I mailed her some information. Dr Leone Payor please advise about anti-spasmodics.

## 2015-01-15 NOTE — Telephone Encounter (Signed)
Patient notified

## 2015-03-20 ENCOUNTER — Telehealth: Payer: Self-pay | Admitting: Family Medicine

## 2015-03-20 NOTE — Telephone Encounter (Signed)
Pt left a message that stated her insurance will no longer pay for one of her medication and needed to know what to do.  Did not leave the name of the medication.  Tried to call back but received the voicemail.  Left a message.

## 2015-03-23 NOTE — Telephone Encounter (Signed)
Left a message for a return call.

## 2015-03-23 NOTE — Telephone Encounter (Signed)
Pt needed to change her Lexapro due to insurance no longer covering.  Advised she come in and speak to [facility].  Pt made appt for 03/31/15

## 2015-03-31 ENCOUNTER — Encounter: Payer: Self-pay | Admitting: Internal Medicine

## 2015-03-31 ENCOUNTER — Ambulatory Visit (INDEPENDENT_AMBULATORY_CARE_PROVIDER_SITE_OTHER): Payer: 59 | Admitting: Internal Medicine

## 2015-03-31 VITALS — BP 142/84 | HR 75 | Temp 98.0°F | Wt 157.3 lb

## 2015-03-31 DIAGNOSIS — Z79899 Other long term (current) drug therapy: Secondary | ICD-10-CM | POA: Diagnosis not present

## 2015-03-31 DIAGNOSIS — J209 Acute bronchitis, unspecified: Secondary | ICD-10-CM

## 2015-03-31 DIAGNOSIS — F4323 Adjustment disorder with mixed anxiety and depressed mood: Secondary | ICD-10-CM

## 2015-03-31 DIAGNOSIS — J988 Other specified respiratory disorders: Secondary | ICD-10-CM

## 2015-03-31 MED ORDER — ALBUTEROL SULFATE (2.5 MG/3ML) 0.083% IN NEBU
2.5000 mg | INHALATION_SOLUTION | Freq: Once | RESPIRATORY_TRACT | Status: AC
  Administered 2015-03-31: 2.5 mg via RESPIRATORY_TRACT

## 2015-03-31 MED ORDER — BENZONATATE 100 MG PO CAPS: 100.0000 mg | ORAL_CAPSULE | Freq: Three times a day (TID) | ORAL | Status: DC | PRN

## 2015-03-31 MED ORDER — ALPRAZOLAM 0.5 MG PO TABS: ORAL_TABLET | ORAL | Status: DC

## 2015-03-31 MED ORDER — ALBUTEROL SULFATE HFA 108 (90 BASE) MCG/ACT IN AERS: 2.0000 | INHALATION_SPRAY | Freq: Four times a day (QID) | RESPIRATORY_TRACT | Status: DC | PRN

## 2015-03-31 MED ORDER — CITALOPRAM HYDROBROMIDE 20 MG PO TABS: 20.0000 mg | ORAL_TABLET | Freq: Every day | ORAL | Status: DC

## 2015-03-31 NOTE — Patient Instructions (Addendum)
Your have wheezy bronchitis  And  Can try inhaler  As needed for wheezing  And  Cough suppression stay off tobacco as you are doing .  After improved  change lexapro to citalopram every day . May have transition period for 1-2 weeks .   If  Not getting better in the next week  Then contact   us for further advice .  Contact us of fever  Inc shortness of breath .

## 2015-03-31 NOTE — Progress Notes (Signed)
Pre visit review using our clinic review tool, if applicable. No additional management support is needed unless otherwise documented below in the visit note.  Chief Complaint  Patient presents with  . Medication Change    Pt's insurance will soon expire.  Also has Cough, Nasal Congestion, Sore Throat, SOB and Wheezing.    HPI: Valerie Love comesin today because her insurance excchange insurance  will  Be too expensive even with subsidy ? 500 per month and she may have no insurance next year  Looking for a less expensive options  For dep anxiety  Doing the best on lexapro and takes ocass alprazolam not regular     ? [facility]Marland Kitchen  Head cold  5-6 days ago  Onset with tichy throat and then cough "horrendous  " spasm and wheezing  No fever chills No hemoptysis   No tobacco for 2 months   Has  headche with this  Allergic to cats .  otc coridicond.    and nyquil .   Never on inhaler  No pleurisy   Insurance for next year .   Exchange  Is 500 with subsidy .   ROS: See pertinent positives and negatives per HPI. See above no vomiting syncope   Past Medical History  Diagnosis Date  . Arthritis   . Thyroid disease in pregnancy   . Childhood asthma   . Hx of varicella   . History of jaundice     as a child    . Depression     related to husband illness and death  . Anxiety   . LBBB (left bundle branch block)     Family History  Problem Relation Age of Onset  . Alcohol abuse Mother     died from stomach ulcers  . Stroke Father     died  from cva and mi  . Hypertension Father   . Hyperlipidemia Father   . Coronary artery disease Father     Age 54  . Colon polyps Sister   . Hepatitis C Brother   . Colon cancer Neg Hx     Social History   Social History  . Marital Status: Married    Spouse Name: N/A  . Number of Children: 3  . Years of Education: N/A   Occupational History  . self     Catering and cleaning   Social History Main Topics  . Smoking status: Current Some  Day Smoker -- 0.50 packs/day for 40 years    Types: Cigarettes  . Smokeless tobacco: Never Used     Comment: none in about 1 week  . Alcohol Use: 0.0 oz/week    0 Standard drinks or equivalent per week     Comment: socially-wine  . Drug Use: No  . Sexual Activity: Not Asked   Other Topics Concern  . None   Social History Narrative   Husband passed away on 03/11/08 hep c and cirrhosis.   She is hep c negative 2006   G3P3   HH of 2-3  son no pets    No falls .  Has smoke detector and wears seat belts. POsitive  firearms. No excess sun exposure.    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   Currently no insuranced and cannot affort 800 per month for catastrophic    Still smokes some. Daily. ocass etoh.  No rec drugs.   Daughter patient in our practice   Dewayne Hatch  myers    Outpatient Prescriptions Prior to Visit  Medication Sig Dispense Refill  . calcium carbonate (OS-CAL) 600 MG TABS tablet Take 600 mg by mouth daily.    . Cholecalciferol (VITAMIN D3 PO) Take 800 mg by mouth daily.    Marland Kitchen dicyclomine (BENTYL) 10 MG capsule Take 1 capsule (10 mg total) by mouth every 6 (six) hours as needed for spasms. 60 capsule 3  . escitalopram (LEXAPRO) 10 MG tablet Take 1 tablet (10 mg total) by mouth daily. 90 tablet 3  . Multiple Vitamin (MULTIVITAMIN) tablet Take 1 tablet by mouth daily. ALIVE    . Naproxen Sodium (ALEVE) 220 MG CAPS Take 2 capsules by mouth 2 (two) times daily.     . Probiotic Product (TRUBIOTICS PO) Take 1 tablet by mouth daily.    Marland Kitchen ALPRAZolam (XANAX) 0.5 MG tablet 1 po as needed, maximum 3 x per day , avoid regular use: Do not take with alcohol 30 tablet 1   No facility-administered medications prior to visit.     EXAM:  BP 142/84 mmHg  Pulse 75  Temp(Src) 98 F (36.7 C) (Oral)  Wt 157 lb 4.8 oz (71.351 kg)  SpO2 97%  Body mass index is 29.47 kg/(m^2).  GENERAL: vitals reviewed and listed above, alert, oriented, appears well hydrated and in no  acute distress wheezy bronchitis cough  Quiet respiration  Non toxic HEENT: atraumatic, conjunctiva  clear, no obvious abnormalities on inspection of external nose and ears tmx clear nares congestion OP : no lesion edema or exudate  Face no focal tenderness NECK: no obvious masses on inspection palpation  LUNGS:  Enc exp wheeze  Tight  After  Nebulizer   Inc bs   Minimal wheeze  clear w cough .Marland Kitchen   No rales CV: HRRR, no clubbing cyanosis or  peripheral edema nl cap refill  MS: moves all extremities without noticeable focal  abnormality PSYCH: pleasant and cooperative,   ASSESSMENT AND PLAN:  Discussed the following assessment and plan:  Wheezing-associated respiratory infection (WARI) - Plan: albuterol (PROVENTIL) (2.5 MG/3ML) 0.083% nebulizer solution 2.5 mg  Medication management  Acute bronchitis, unspecified organism  Adjustment reaction with anxiety and depression - stable on lexapro may hve to go wo insurance  next year trial celexa  limited xanax  Acts like viral infection  No evidence  of pna  At this time    Add ventolin / tessalon sx stay tobacco free    Expectant management. Alarm sx reported   Antibiotic  Not indicated to help at this time but close fu advised  -Patient advised to return or notify health care team  if symptoms worsen ,persist or new concerns arise. Recheck into insurance without  Son before giving up on   Renewal.  Patient Instructions  Your have wheezy bronchitis  And  Can try inhaler  As needed for wheezing  And  Cough suppression stay off tobacco as you are doing .  After improved  change lexapro to citalopram every day . May have transition period for 1-2 weeks .   If  Not getting better in the next week  Then contact   us for further advice .  Contact us of fever  Inc shortness of breath .    Neta Mends. Shaunika Italiano M.D.

## 2015-04-01 ENCOUNTER — Telehealth: Payer: Self-pay | Admitting: Family Medicine

## 2015-04-01 NOTE — Telephone Encounter (Signed)
Pt seen 03/31/15.  Wants to know if she is contagious and should stay home from Thanksgiving meal with her family.  Please advise.  Thanks!!!

## 2015-04-01 NOTE — Telephone Encounter (Signed)
Pt.notified

## 2015-04-01 NOTE — Telephone Encounter (Signed)
She is probably  Mild;y contagious   Up to her if she wants to isolate herself based on how she feels  .   Would avoid contactIf infants or  Sick frail persons   otherwise .

## 2015-04-07 ENCOUNTER — Telehealth: Payer: Self-pay | Admitting: Family Medicine

## 2015-04-07 NOTE — Telephone Encounter (Signed)
Left a message for a return call.

## 2015-04-07 NOTE — Telephone Encounter (Signed)
Pt left a message on my machine stating that she is not feeling better and would like to get an antibiotic.  Will need to call for further information.

## 2015-04-08 MED ORDER — DOXYCYCLINE HYCLATE 100 MG PO TABS: 100.0000 mg | ORAL_TABLET | Freq: Two times a day (BID) | ORAL | Status: DC

## 2015-04-08 NOTE — Telephone Encounter (Signed)
Send in doxycycline  100 mg 1 po bid for 7 days

## 2015-04-08 NOTE — Telephone Encounter (Signed)
Left a message on voicemail (as directed by pt) informing her that rx has been sent to the pharmacy.

## 2015-04-08 NOTE — Telephone Encounter (Signed)
Spoke to the pt.  Continues to have a productive cough/yellow Having pain in her back Does have SOB Using her inhaler Wheezing better No fever Been taking NyQuil cold and flu   [facility] [address]

## 2015-04-08 NOTE — Addendum Note (Signed)
Addended byBerniece Andreas K on: 04/08/2015 09:49 AM   Modules accepted: Orders

## 2015-04-08 NOTE — Addendum Note (Signed)
Addended by: Raj Janus T on: 04/08/2015 10:08 AM   Modules accepted: Orders

## 2015-07-08 DIAGNOSIS — M419 Scoliosis, unspecified: Secondary | ICD-10-CM

## 2015-07-08 HISTORY — DX: Scoliosis, unspecified: M41.9

## 2015-09-02 ENCOUNTER — Other Ambulatory Visit: Payer: Self-pay | Admitting: Family Medicine

## 2015-09-02 NOTE — Telephone Encounter (Signed)
If she is doing well can refill celexa until november needs yearly visit .before then  Or  If she gets insurance in the interim.

## 2015-09-02 NOTE — Telephone Encounter (Signed)
When should pt come in?  May need a refill of Celexa until new appt.  Please advise.  Thanks!

## 2015-09-04 NOTE — Telephone Encounter (Signed)
Left a message for a return call.

## 2015-09-07 NOTE — Telephone Encounter (Signed)
Left a message for a return call.

## 2015-09-08 MED ORDER — CITALOPRAM HYDROBROMIDE 20 MG PO TABS: 20.0000 mg | ORAL_TABLET | Freq: Every day | ORAL | Status: DC

## 2015-09-08 NOTE — Telephone Encounter (Signed)
Spoke to the pt.   She will come in for 30 minute med check in Nov.  Will have yearly and lab work done with gyn.  Doing well.  Now has insurance.  Celexa filled until next appointment.  Requesting a refill of Xanax.  Stated she sometimes takes 2 per week or less.  Only time she takes it regularly is when she feels her heart flutter.

## 2015-09-08 NOTE — Telephone Encounter (Signed)
Ok to refill x 1  

## 2015-09-11 ENCOUNTER — Other Ambulatory Visit: Payer: Self-pay | Admitting: Internal Medicine

## 2015-09-11 MED ORDER — ALPRAZOLAM 0.5 MG PO TABS: ORAL_TABLET | ORAL | Status: DC

## 2015-09-11 NOTE — Telephone Encounter (Signed)
Called to the pharmacy and left on machine. 

## 2015-10-30 ENCOUNTER — Other Ambulatory Visit: Payer: Self-pay | Admitting: Internal Medicine

## 2015-11-02 ENCOUNTER — Telehealth: Payer: Self-pay | Admitting: Family Medicine

## 2015-11-02 NOTE — Telephone Encounter (Signed)
Pt called and left a message on my machine.  Stated she could not get the lab work done at ob/gyn.  Need to see if she had any lab work.  Left a message for the pt to call back.  Has yearly follow up in Nov.

## 2015-11-03 ENCOUNTER — Other Ambulatory Visit: Payer: Self-pay | Admitting: Family Medicine

## 2015-11-03 NOTE — Telephone Encounter (Signed)
Sent to the pharmacy by e-scribe. 

## 2015-11-03 NOTE — Telephone Encounter (Signed)
Spoke to the pt.  She did not have any lab work performed at OB/GYN.  Lab scheduled made before cpx

## 2015-11-03 NOTE — Telephone Encounter (Signed)
Ok x 1

## 2015-11-04 MED ORDER — ALPRAZOLAM 0.5 MG PO TABS: ORAL_TABLET | ORAL | Status: DC

## 2015-11-04 NOTE — Telephone Encounter (Signed)
Called to the pharmacy and left on machine. 

## 2016-02-22 ENCOUNTER — Ambulatory Visit (INDEPENDENT_AMBULATORY_CARE_PROVIDER_SITE_OTHER): Payer: BLUE CROSS/BLUE SHIELD | Admitting: Orthopedic Surgery

## 2016-02-22 DIAGNOSIS — M1712 Unilateral primary osteoarthritis, left knee: Secondary | ICD-10-CM

## 2016-02-23 ENCOUNTER — Telehealth: Payer: Self-pay | Admitting: Family Medicine

## 2016-02-23 NOTE — Telephone Encounter (Signed)
Spoke to the pt and informed her will check to see what she needs when she comes in to next office visit.  Pt states she will reschedule after she is back to driving.

## 2016-02-23 NOTE — Telephone Encounter (Signed)
Pt left a message on my machine.  Stated she is getting ready to have a complete knee replacement.  She has an appointment for labs with [facility].  The hospital is also doing labs.  Wanted to know if she still needed to come for labs.  Please advise.  Thanks!!!

## 2016-02-23 NOTE — Telephone Encounter (Signed)
Ok to not do labs here   And   Decide on labs at her next visit .  Assuming her labs will be done before  visit here?

## 2016-02-24 ENCOUNTER — Other Ambulatory Visit (HOSPITAL_COMMUNITY): Payer: Self-pay | Admitting: Family

## 2016-03-08 ENCOUNTER — Encounter (HOSPITAL_COMMUNITY): Payer: Self-pay

## 2016-03-08 ENCOUNTER — Encounter (HOSPITAL_COMMUNITY)
Admission: RE | Admit: 2016-03-08 | Discharge: 2016-03-08 | Disposition: A | Discharge status: Home / Self Care | Payer: BLUE CROSS/BLUE SHIELD | Source: Ambulatory Visit | Attending: Orthopedic Surgery | Admitting: Orthopedic Surgery

## 2016-03-08 DIAGNOSIS — M1711 Unilateral primary osteoarthritis, right knee: Secondary | ICD-10-CM | POA: Insufficient documentation

## 2016-03-08 DIAGNOSIS — Z01812 Encounter for preprocedural laboratory examination: Secondary | ICD-10-CM | POA: Diagnosis not present

## 2016-03-08 DIAGNOSIS — Z01810 Encounter for preprocedural cardiovascular examination: Secondary | ICD-10-CM | POA: Diagnosis present

## 2016-03-08 HISTORY — DX: Adverse effect of unspecified anesthetic, initial encounter: T41.45XA

## 2016-03-08 HISTORY — DX: Other complications of anesthesia, initial encounter: T88.59XA

## 2016-03-08 HISTORY — DX: Thyrotoxicosis, unspecified without thyrotoxic crisis or storm: E05.90

## 2016-03-08 LAB — BASIC METABOLIC PANEL
Anion gap: 6 (ref 5–15)
BUN: 20 mg/dL (ref 6–20)
CHLORIDE: 107 mmol/L (ref 101–111)
CO2: 27 mmol/L (ref 22–32)
CREATININE: 0.72 mg/dL (ref 0.44–1.00)
Calcium: 9.7 mg/dL (ref 8.9–10.3)
GFR calc non Af Amer: >60 mL/min (ref >60)
Glucose, Bld: 91 mg/dL (ref 65–99)
POTASSIUM: 4.4 mmol/L (ref 3.5–5.1)
Sodium: 140 mmol/L (ref 135–145)

## 2016-03-08 LAB — SURGICAL PCR SCREEN
MRSA, PCR: NEGATIVE
Staphylococcus aureus: POSITIVE — AB

## 2016-03-08 LAB — CBC
HEMATOCRIT: 40.2 % (ref 36.0–46.0)
HEMOGLOBIN: 13 g/dL (ref 12.0–15.0)
MCH: 29.5 pg (ref 26.0–34.0)
MCHC: 32.3 g/dL (ref 30.0–36.0)
MCV: 91.2 fL (ref 78.0–100.0)
PLATELETS: 321 10*3/uL (ref 150–400)
RBC: 4.41 MIL/uL (ref 3.87–5.11)
RDW: 13.1 % (ref 11.5–15.5)
WBC: 6.7 10*3/uL (ref 4.0–10.5)

## 2016-03-08 NOTE — Progress Notes (Signed)
Pt. Denies chest or breathing concerns. Had a visit with Dr. Jens Som several yrs. Ago, states that all went well ( was undergoing tremendous stress with loss of her spouse & raising 3 teen children). Was told to return in 2 yrs.  She did not follow up  because she didn't have insurance at the time. Reports that she is aware of palpitations, perhaps 3-4 times per year.  Will have anesth. Review chart for cardiac history.

## 2016-03-08 NOTE — Pre-Procedure Instructions (Signed)
Valerie NorrisJudy Love  03/08/2016      [facility] [place] - [address] [place] [address] Phone: [phone] Fax: [phone]  [facility] [place], [place] - [address] [place] [address] Phone: [phone] Fax: [phone]    Your procedure is scheduled on Wednesday, November 8th.  [name] at 6:30 AM                Your surgery or procedure is scheduled for 8:30 AM        Call this number if you have problems the morning of [phone]                  For any other questions, please call [phone], Monday - Friday 8 AM - 4 PM.   Remember:  Do not eat food or drink liquids after midnight Tuesday, November 7th  Take these medicines the morning of surgery with A SIP OF WATER: May take if needed: ALPRAZolam Prudy Feeler(XANAX), dicyclomine (BENTYL).              1 Week prior to surgery STOP taking Aspirin , Aspirin Products (Goody Powder, Excedrin Migraine), Ibuprofen (Advil), Naproxen (Aleve), Vitamins and Herbal Products (ie Fish Oil)                  [place]- Preparing For Surgery  Before surgery, you can play an important role. Because skin is not sterile, your skin needs to be as free of germs as possible. You can reduce the number of germs on your skin by washing with CHG (chlorahexidine gluconate) Soap before surgery.  CHG is an antiseptic cleaner which kills germs and bonds with the skin to continue killing germs even after washing.  Please do not use if you have an allergy to CHG or antibacterial soaps. If your skin becomes reddened/irritated stop using the CHG.  Do not shave (including legs and underarms) for at least 48 hours prior to first CHG shower. It is OK to shave your face.  Please follow these instructions carefully.   1. Shower the NIGHT BEFORE SURGERY and the MORNING OF SURGERY with CHG.   2. If you chose to  wash your hair, wash your hair first as usual with your normal shampoo.  3. After you shampoo, rinse your hair and body thoroughly to remove the shampoo.  4. Use CHG as you would any other liquid soap. You can apply CHG directly to the skin and wash gently with a scrungie or a clean washcloth.   5. Apply the CHG Soap to your body ONLY FROM THE NECK DOWN.  Do not use on open wounds or open sores. Avoid contact with your eyes, ears, mouth and genitals (private parts). Wash genitals (private parts) with your normal soap.  6. Wash thoroughly, paying special attention to the area where your surgery will be performed.  7. Thoroughly rinse your body with warm water from the neck down.  8. DO NOT shower/wash with your normal soap after using and rinsing off the CHG Soap.  9. Pat yourself dry with a CLEAN TOWEL.   10. Wear CLEAN PAJAMAS   11. Place CLEAN SHEETS on your bed the night of your first shower and DO NOT SLEEP WITH PETS.  Day of Surgery: Do not apply any deodorants/lotions. Please wear clean clothes to the hospital/surgery center.  Do not wear jewelry, make-up or nail polish.  Do not wear lotions, powders, or perfumes, or deodorant.  Do not shave 48 hours prior to surgery.    Do not bring valuables to the hospital.  [facility] is not responsible for any belongings or valuables.  Contacts, dentures or bridgework may not be worn into surgery.  Leave your suitcase in the car.  After surgery it may be brought to your room.  For patients admitted to the hospital, discharge time will be determined by your treatment team.  Patients discharged the day of surgery will not be allowed to drive home.   Name and phone number of your driver:  Special instructions:  -  Please read over the following fact sheets that you were given. [employer]- Preparing For Surgery and Patient Instructions for Mupirocin Application, Incentive Spirometry, Pain Management

## 2016-03-09 NOTE — Progress Notes (Signed)
Anesthesia Chart Review:  Pt is a 60 year old female scheduled for R total knee arthroplasty on 03/16/2016 with Aldean Baker, MD.   PMH includes:  LBBB.  Former smoker. BMI 30  Medications include: ASA  Preoperative labs reviewed.    EKG 03/08/16: sinus bradycardia (54 bpm). LBBB. Appears stable when compared to 08/18/10 EKG   Nuclear stress test 09/15/10: Low risk stress nuclear study.  There is a mild, fixed septal perfusion defect that I suspect is secondary to LBBB.  No ischemia.  Dyssynchronous septal and lateral walls.   Echo 09/08/10:  - Left ventricle: The cavity size was normal. Septal bounce consistent with bundle branch block. Systolic function was mildly reduced. The estimated ejection fraction was 50%. No regional wall motion abnormalities. Doppler parameters are consistent with abnormal left ventricular relaxation (grade 1 diastolic dysfunction). - Aortic valve: There was no stenosis. Trivial regurgitation. - Mitral valve: Trivial regurgitation. - Left atrium: The atrium was mildly dilated. - Right ventricle: The cavity size was normal. Systolic function was normal. - Pulmonary arteries: PA peak pressure: 35mm Hg (S). - Inferior vena cava: The vessel was normal in size; the respirophasic diameter changes were in the normal range (= 50%); findings are consistent with normal central venous pressure.  Pt saw Olga Millers, MD with cardiology 09/2010 for palpitations. His note indicates he thought her symptoms sounded like non-sustained flutter. He put her on metoprolol, offered cardiac event monitor which pt declined.  His note indicates he would consider event monitor again in the future if her symptoms became persistent. She was not able to pursue routine f/u with Dr. Jens Som and no longer takes metoprolol.  PAT RN notes indicate pt is aware of palpitations 3-4 times per year. EKG at PAT shows sinus brady and LBBB (which is old).  If no changes, I anticipate pt can proceed with surgery  as scheduled.   Rica Mast, FNP-BC [facility]/Anesthesiology Phone: [phone] 03/09/2016 3:43 PM

## 2016-03-14 ENCOUNTER — Other Ambulatory Visit: Payer: Self-pay

## 2016-03-16 ENCOUNTER — Inpatient Hospital Stay (HOSPITAL_COMMUNITY): Payer: BLUE CROSS/BLUE SHIELD | Admitting: Anesthesiology

## 2016-03-16 ENCOUNTER — Encounter (HOSPITAL_COMMUNITY)
Admission: RE | Disposition: A | Discharge status: Home Health | Payer: Self-pay | Source: Ambulatory Visit | Attending: Orthopedic Surgery

## 2016-03-16 ENCOUNTER — Inpatient Hospital Stay (HOSPITAL_COMMUNITY)
Admission: RE | Admit: 2016-03-16 | Discharge: 2016-03-18 | DRG: 470 | Disposition: A | Discharge status: Home Health | Payer: BLUE CROSS/BLUE SHIELD | Source: Ambulatory Visit | Attending: Orthopedic Surgery | Admitting: Orthopedic Surgery

## 2016-03-16 ENCOUNTER — Encounter (HOSPITAL_COMMUNITY): Payer: Self-pay | Admitting: *Deleted

## 2016-03-16 ENCOUNTER — Inpatient Hospital Stay (HOSPITAL_COMMUNITY): Payer: BLUE CROSS/BLUE SHIELD | Admitting: Emergency Medicine

## 2016-03-16 DIAGNOSIS — R262 Difficulty in walking, not elsewhere classified: Secondary | ICD-10-CM

## 2016-03-16 DIAGNOSIS — M25661 Stiffness of right knee, not elsewhere classified: Secondary | ICD-10-CM

## 2016-03-16 DIAGNOSIS — Z885 Allergy status to narcotic agent status: Secondary | ICD-10-CM

## 2016-03-16 DIAGNOSIS — Z79899 Other long term (current) drug therapy: Secondary | ICD-10-CM | POA: Diagnosis not present

## 2016-03-16 DIAGNOSIS — Z7982 Long term (current) use of aspirin: Secondary | ICD-10-CM | POA: Diagnosis not present

## 2016-03-16 DIAGNOSIS — F339 Major depressive disorder, recurrent, unspecified: Secondary | ICD-10-CM | POA: Diagnosis present

## 2016-03-16 DIAGNOSIS — Z87891 Personal history of nicotine dependence: Secondary | ICD-10-CM

## 2016-03-16 DIAGNOSIS — Z96651 Presence of right artificial knee joint: Secondary | ICD-10-CM

## 2016-03-16 DIAGNOSIS — M1711 Unilateral primary osteoarthritis, right knee: Secondary | ICD-10-CM | POA: Diagnosis not present

## 2016-03-16 HISTORY — PX: TOTAL KNEE ARTHROPLASTY: SHX125

## 2016-03-16 HISTORY — DX: Scoliosis, unspecified: M41.9

## 2016-03-16 HISTORY — DX: Irritable bowel syndrome, unspecified: K58.9

## 2016-03-16 SURGERY — ARTHROPLASTY, KNEE, TOTAL
Anesthesia: Spinal | Laterality: Right

## 2016-03-16 MED ORDER — MENTHOL 3 MG MT LOZG: 1.0000 | LOZENGE | OROMUCOSAL | Status: DC | PRN

## 2016-03-16 MED ORDER — CEFAZOLIN SODIUM-DEXTROSE 2-4 GM/100ML-% IV SOLN
2.0000 g | INTRAVENOUS | Status: AC
  Administered 2016-03-16: 2 g via INTRAVENOUS
  Filled 2016-03-16: qty 100

## 2016-03-16 MED ORDER — ALPRAZOLAM 0.5 MG PO TABS
0.5000 mg | ORAL_TABLET | Freq: Three times a day (TID) | ORAL | Status: DC | PRN
  Administered 2016-03-16 – 2016-03-17 (×2): 0.5 mg via ORAL
  Filled 2016-03-16 (×2): qty 1

## 2016-03-16 MED ORDER — MIDAZOLAM HCL 2 MG/2ML IJ SOLN
INTRAMUSCULAR | Status: AC
  Filled 2016-03-16: qty 2

## 2016-03-16 MED ORDER — BUPIVACAINE IN DEXTROSE 0.75-8.25 % IT SOLN
INTRATHECAL | Status: DC | PRN
  Administered 2016-03-16: 1.5 mL via INTRATHECAL

## 2016-03-16 MED ORDER — DIPHENHYDRAMINE HCL 50 MG/ML IJ SOLN
INTRAMUSCULAR | Status: AC
  Filled 2016-03-16: qty 1

## 2016-03-16 MED ORDER — BISACODYL 10 MG RE SUPP: 10.0000 mg | Freq: Every day | RECTAL | Status: DC | PRN

## 2016-03-16 MED ORDER — POLYETHYLENE GLYCOL 3350 17 G PO PACK: 17.0000 g | PACK | Freq: Every day | ORAL | Status: DC | PRN

## 2016-03-16 MED ORDER — MAGNESIUM CITRATE PO SOLN: 1.0000 | Freq: Once | ORAL | Status: DC | PRN

## 2016-03-16 MED ORDER — ONDANSETRON HCL 4 MG/2ML IJ SOLN
4.0000 mg | Freq: Four times a day (QID) | INTRAMUSCULAR | Status: DC | PRN
Start: 2016-03-16 — End: 2016-03-18
  Administered 2016-03-16: 4 mg via INTRAVENOUS
  Filled 2016-03-16: qty 2

## 2016-03-16 MED ORDER — ASPIRIN EC 325 MG PO TBEC
325.0000 mg | DELAYED_RELEASE_TABLET | Freq: Every day | ORAL | Status: DC
  Administered 2016-03-17 – 2016-03-18 (×2): 325 mg via ORAL
  Filled 2016-03-16 (×2): qty 1

## 2016-03-16 MED ORDER — LACTATED RINGERS IV SOLN
INTRAVENOUS | Status: DC | PRN
Start: 2016-03-16 — End: 2016-03-16
  Administered 2016-03-16 (×2): via INTRAVENOUS

## 2016-03-16 MED ORDER — PROPOFOL 500 MG/50ML IV EMUL
INTRAVENOUS | Status: DC | PRN
  Administered 2016-03-16: 100 ug/kg/min via INTRAVENOUS

## 2016-03-16 MED ORDER — CEFAZOLIN IN D5W 1 GM/50ML IV SOLN
1.0000 g | Freq: Four times a day (QID) | INTRAVENOUS | Status: AC
  Administered 2016-03-16 (×2): 1 g via INTRAVENOUS
  Filled 2016-03-16 (×2): qty 50

## 2016-03-16 MED ORDER — KETOROLAC TROMETHAMINE 15 MG/ML IJ SOLN
15.0000 mg | Freq: Four times a day (QID) | INTRAMUSCULAR | Status: AC
Start: 2016-03-16 — End: 2016-03-17
  Administered 2016-03-16 – 2016-03-17 (×4): 15 mg via INTRAVENOUS
  Filled 2016-03-16 (×4): qty 1

## 2016-03-16 MED ORDER — SODIUM CHLORIDE 0.9 % IV SOLN
INTRAVENOUS | Status: DC
  Administered 2016-03-16: 12:00:00 via INTRAVENOUS

## 2016-03-16 MED ORDER — LACTATED RINGERS IV SOLN: INTRAVENOUS | Status: DC

## 2016-03-16 MED ORDER — MEPERIDINE HCL 25 MG/ML IJ SOLN: 6.2500 mg | INTRAMUSCULAR | Status: DC | PRN

## 2016-03-16 MED ORDER — ACETAMINOPHEN 650 MG RE SUPP: 650.0000 mg | Freq: Four times a day (QID) | RECTAL | Status: DC | PRN

## 2016-03-16 MED ORDER — METOCLOPRAMIDE HCL 5 MG/ML IJ SOLN: 5.0000 mg | Freq: Three times a day (TID) | INTRAMUSCULAR | Status: DC | PRN

## 2016-03-16 MED ORDER — DICYCLOMINE HCL 10 MG PO CAPS: 10.0000 mg | ORAL_CAPSULE | Freq: Four times a day (QID) | ORAL | Status: DC | PRN

## 2016-03-16 MED ORDER — DOCUSATE SODIUM 100 MG PO CAPS
100.0000 mg | ORAL_CAPSULE | Freq: Two times a day (BID) | ORAL | Status: DC
  Administered 2016-03-16 – 2016-03-18 (×4): 100 mg via ORAL
  Filled 2016-03-16 (×4): qty 1

## 2016-03-16 MED ORDER — MIDAZOLAM HCL 5 MG/5ML IJ SOLN
INTRAMUSCULAR | Status: DC | PRN
  Administered 2016-03-16 (×2): 1 mg via INTRAVENOUS

## 2016-03-16 MED ORDER — TRAMADOL HCL 50 MG PO TABS
100.0000 mg | ORAL_TABLET | Freq: Four times a day (QID) | ORAL | Status: DC | PRN
  Administered 2016-03-16 – 2016-03-18 (×6): 100 mg via ORAL
  Filled 2016-03-16 (×6): qty 2

## 2016-03-16 MED ORDER — BUPIVACAINE LIPOSOME 1.3 % IJ SUSP
20.0000 mL | INTRAMUSCULAR | Status: DC
  Filled 2016-03-16: qty 20

## 2016-03-16 MED ORDER — FENTANYL CITRATE (PF) 100 MCG/2ML IJ SOLN
INTRAMUSCULAR | Status: AC
  Filled 2016-03-16: qty 2

## 2016-03-16 MED ORDER — 0.9 % SODIUM CHLORIDE (POUR BTL) OPTIME
TOPICAL | Status: DC | PRN
  Administered 2016-03-16: 1000 mL

## 2016-03-16 MED ORDER — FENTANYL CITRATE (PF) 100 MCG/2ML IJ SOLN
INTRAMUSCULAR | Status: DC | PRN
  Administered 2016-03-16: 50 ug via INTRAVENOUS

## 2016-03-16 MED ORDER — FENTANYL CITRATE (PF) 100 MCG/2ML IJ SOLN
25.0000 ug | INTRAMUSCULAR | Status: DC | PRN
  Administered 2016-03-16 (×3): 50 ug via INTRAVENOUS

## 2016-03-16 MED ORDER — TRANEXAMIC ACID 1000 MG/10ML IV SOLN
2000.0000 mg | INTRAVENOUS | Status: DC
  Filled 2016-03-16: qty 20

## 2016-03-16 MED ORDER — DIPHENHYDRAMINE HCL 50 MG/ML IJ SOLN
25.0000 mg | Freq: Once | INTRAMUSCULAR | Status: AC
  Administered 2016-03-16: 25 mg via INTRAVENOUS

## 2016-03-16 MED ORDER — OXYCODONE HCL 5 MG PO TABS
5.0000 mg | ORAL_TABLET | ORAL | Status: DC | PRN
  Administered 2016-03-16: 10 mg via ORAL
  Filled 2016-03-16 (×2): qty 2

## 2016-03-16 MED ORDER — OXYCODONE HCL 5 MG PO TABS
ORAL_TABLET | ORAL | Status: AC
  Filled 2016-03-16: qty 2

## 2016-03-16 MED ORDER — CITALOPRAM HYDROBROMIDE 20 MG PO TABS
20.0000 mg | ORAL_TABLET | Freq: Every day | ORAL | Status: DC
  Administered 2016-03-16 – 2016-03-17 (×2): 20 mg via ORAL
  Filled 2016-03-16 (×2): qty 1

## 2016-03-16 MED ORDER — METOCLOPRAMIDE HCL 5 MG/ML IJ SOLN: 10.0000 mg | Freq: Once | INTRAMUSCULAR | Status: DC | PRN

## 2016-03-16 MED ORDER — DIPHENHYDRAMINE HCL 50 MG/ML IJ SOLN
25.0000 mg | Freq: Once | INTRAMUSCULAR | Status: AC
  Administered 2016-03-16: 25 mg via INTRAVENOUS
  Filled 2016-03-16: qty 1

## 2016-03-16 MED ORDER — PHENOL 1.4 % MT LIQD: 1.0000 | OROMUCOSAL | Status: DC | PRN

## 2016-03-16 MED ORDER — EPHEDRINE SULFATE-NACL 50-0.9 MG/10ML-% IV SOSY
PREFILLED_SYRINGE | INTRAVENOUS | Status: DC | PRN
  Administered 2016-03-16: 10 mg via INTRAVENOUS

## 2016-03-16 MED ORDER — HYDROMORPHONE HCL 2 MG/ML IJ SOLN
1.0000 mg | INTRAMUSCULAR | Status: DC | PRN
  Administered 2016-03-16 – 2016-03-18 (×6): 1 mg via INTRAVENOUS
  Filled 2016-03-16 (×6): qty 1

## 2016-03-16 MED ORDER — METOCLOPRAMIDE HCL 5 MG PO TABS: 5.0000 mg | ORAL_TABLET | Freq: Three times a day (TID) | ORAL | Status: DC | PRN

## 2016-03-16 MED ORDER — SODIUM CHLORIDE 0.9 % IR SOLN
Status: DC | PRN
  Administered 2016-03-16: 3000 mL

## 2016-03-16 MED ORDER — CHLORHEXIDINE GLUCONATE 4 % EX LIQD: 60.0000 mL | Freq: Once | CUTANEOUS | Status: DC

## 2016-03-16 MED ORDER — ACETAMINOPHEN 325 MG PO TABS
650.0000 mg | ORAL_TABLET | Freq: Four times a day (QID) | ORAL | Status: DC | PRN
  Filled 2016-03-16: qty 2

## 2016-03-16 MED ORDER — ONDANSETRON HCL 4 MG PO TABS: 4.0000 mg | ORAL_TABLET | Freq: Four times a day (QID) | ORAL | Status: DC | PRN

## 2016-03-16 SURGICAL SUPPLY — 50 items
BLADE SAGITTAL 25.0X1.19X90 (BLADE) ×2 IMPLANT
BLADE SAW SGTL 13X75X1.27 (BLADE) ×2 IMPLANT
BLADE SURG 21 STRL SS (BLADE) ×4 IMPLANT
BNDG COHESIVE 6X5 TAN STRL LF (GAUZE/BANDAGES/DRESSINGS) ×4 IMPLANT
BNDG GAUZE ELAST 4 BULKY (GAUZE/BANDAGES/DRESSINGS) ×2 IMPLANT
BONE CEMENT PALACOSE (Orthopedic Implant) ×4 IMPLANT
BOWL SMART MIX CTS (DISPOSABLE) IMPLANT
CAP KNEE TOTAL 3 SIGMA ×2 IMPLANT
CEMENT BONE PALACOSE (Orthopedic Implant) ×2 IMPLANT
COVER SURGICAL LIGHT HANDLE (MISCELLANEOUS) ×2 IMPLANT
CUFF TOURNIQUET SINGLE 34IN LL (TOURNIQUET CUFF) ×2 IMPLANT
CUFF TOURNIQUET SINGLE 44IN (TOURNIQUET CUFF) IMPLANT
DRAPE HALF SHEET 40X57 (DRAPES) ×2 IMPLANT
DRAPE PROXIMA HALF (DRAPES) ×2 IMPLANT
DRAPE U-SHAPE 47X51 STRL (DRAPES) ×2 IMPLANT
DRSG ADAPTIC 3X8 NADH LF (GAUZE/BANDAGES/DRESSINGS) ×2 IMPLANT
DRSG PAD ABDOMINAL 8X10 ST (GAUZE/BANDAGES/DRESSINGS) ×2 IMPLANT
DURAPREP 26ML APPLICATOR (WOUND CARE) ×2 IMPLANT
ELECT REM PT RETURN 9FT ADLT (ELECTROSURGICAL) ×2
ELECTRODE REM PT RTRN 9FT ADLT (ELECTROSURGICAL) ×1 IMPLANT
FACESHIELD WRAPAROUND (MASK) ×2 IMPLANT
GAUZE SPONGE 4X4 12PLY STRL (GAUZE/BANDAGES/DRESSINGS) ×2 IMPLANT
GLOVE BIOGEL PI IND STRL 9 (GLOVE) ×1 IMPLANT
GLOVE BIOGEL PI INDICATOR 9 (GLOVE) ×1
GLOVE SURG ORTHO 9.0 STRL STRW (GLOVE) ×2 IMPLANT
GOWN STRL REUS W/ TWL XL LVL3 (GOWN DISPOSABLE) ×2 IMPLANT
GOWN STRL REUS W/TWL XL LVL3 (GOWN DISPOSABLE) ×4
HANDPIECE INTERPULSE COAX TIP (DISPOSABLE) ×2
KIT BASIN OR (CUSTOM PROCEDURE TRAY) ×2 IMPLANT
KIT ROOM TURNOVER OR (KITS) ×2 IMPLANT
MANIFOLD NEPTUNE II (INSTRUMENTS) ×2 IMPLANT
NEEDLE SPNL 18GX3.5 QUINCKE PK (NEEDLE) ×2 IMPLANT
NS IRRIG 1000ML POUR BTL (IV SOLUTION) ×2 IMPLANT
PACK TOTAL JOINT (CUSTOM PROCEDURE TRAY) ×2 IMPLANT
PACK UNIVERSAL I (CUSTOM PROCEDURE TRAY) ×2 IMPLANT
PAD ARMBOARD 7.5X6 YLW CONV (MISCELLANEOUS) ×2 IMPLANT
SET HNDPC FAN SPRY TIP SCT (DISPOSABLE) ×1 IMPLANT
STAPLER VISISTAT 35W (STAPLE) ×2 IMPLANT
SUCTION FRAZIER HANDLE 10FR (MISCELLANEOUS)
SUCTION TUBE FRAZIER 10FR DISP (MISCELLANEOUS) IMPLANT
SUT VIC AB 0 CT1 27 (SUTURE) ×2
SUT VIC AB 0 CT1 27XBRD ANBCTR (SUTURE) ×1 IMPLANT
SUT VIC AB 1 CTX 36 (SUTURE)
SUT VIC AB 1 CTX36XBRD ANBCTR (SUTURE) IMPLANT
SYR 50ML LL SCALE MARK (SYRINGE) ×2 IMPLANT
TOWEL OR 17X24 6PK STRL BLUE (TOWEL DISPOSABLE) ×2 IMPLANT
TOWEL OR 17X26 10 PK STRL BLUE (TOWEL DISPOSABLE) ×2 IMPLANT
TRAY CATH 16FR W/PLASTIC CATH (SET/KITS/TRAYS/PACK) IMPLANT
TRAY FOLEY CATH 16FRSI W/METER (SET/KITS/TRAYS/PACK) IMPLANT
WRAP KNEE MAXI GEL POST OP (GAUZE/BANDAGES/DRESSINGS) ×2 IMPLANT

## 2016-03-16 NOTE — Anesthesia Postprocedure Evaluation (Signed)
Anesthesia Post Note  Patient: Valerie Love  Procedure(s) Performed: Procedure(s) (LRB): TOTAL KNEE ARTHROPLASTY (Right)  Patient location during evaluation: PACU Anesthesia Type: Spinal Level of consciousness: awake and alert Pain management: pain level controlled Vital Signs Assessment: post-procedure vital signs reviewed and stable Respiratory status: spontaneous breathing and respiratory function stable Cardiovascular status: blood pressure returned to baseline and stable Postop Assessment: no headache, no backache and spinal receding Anesthetic complications: no    Last Vitals:  Vitals:   03/16/16 1110 03/16/16 1140  BP: 138/77 (!) 156/82  Pulse: 68 70  Resp: 16 16  Temp:      Last Pain:  Vitals:   03/16/16 1140  TempSrc:   PainSc: 5                  Phillips Grout

## 2016-03-16 NOTE — Evaluation (Signed)
Occupational Therapy Evaluation Patient Details Name: Valerie Love MRN: [id number] DOB: 1956/02/05 Today's Date: 03/16/2016    History of Present Illness Pt is a 60 y.o. female s/p R total knee arthroplasty. Pt has a past medical history significant for but not limited to anxiety, arthritis, depression, hyperthyroidism and a past surgical history significant for R knee surgery and B breast biopsies.   Clinical Impression   PTA, pt was independent with ADL and IADL. Pt currently requires min assist for LB ADL and functional mobility. Pt with increase in pain following simulated toilet transfer and RN notified who provided pain medications at that time. Pt would benefit from continued OT services while admitted in order to improve independence with ADL and functional mobility. Pt will have assistance most of the day from various family members. Recommend D/C home with family support and no OT follow-up recommended at this time. OT will continue to follow acutely.    Follow Up Recommendations  Supervision/Assistance - 24 hour;No OT follow up    Equipment Recommendations  None recommended by OT       Precautions / Restrictions Precautions Precautions: Knee Precaution Booklet Issued: No Precaution Comments: Reviewed no pillow under knee and knee precautions durnig ADL. Restrictions Weight Bearing Restrictions: Yes RLE Weight Bearing: Weight bearing as tolerated      Mobility Bed Mobility Overal bed mobility: Needs Assistance Bed Mobility: Supine to Sit;Sit to Supine     Supine to sit: Min assist Sit to supine: Min assist   General bed mobility comments: Min assist for B LE's into and out of bed.  Transfers Overall transfer level: Needs assistance Equipment used: Rolling walker (2 wheeled) Transfers: Sit to/from Stand Sit to Stand: Min assist              Balance Overall balance assessment: Needs assistance Sitting-balance support: No upper extremity supported;Feet  supported Sitting balance-Leahy Scale: Good     Standing balance support: During functional activity;Bilateral upper extremity supported Standing balance-Leahy Scale: Poor Standing balance comment: Reliant on B UE support from RW.                            ADL Overall ADL's : Needs assistance/impaired     Grooming: Set up;Sitting   Upper Body Bathing: Set up;Sitting   Lower Body Bathing: Minimal assistance;Sit to/from stand   Upper Body Dressing : Set up;Sitting   Lower Body Dressing: Minimal assistance;Sit to/from stand   Toilet Transfer: Minimal assistance;Ambulation;BSC;RW Toilet Transfer Details (indicate cue type and reason): Simulated with sit<>stand followed by ambulation. Toileting- Clothing Manipulation and Hygiene: Minimal assistance;Sit to/from stand       Functional mobility during ADLs: Minimal assistance;Rolling walker General ADL Comments: Educated pt and family on knee precautions during ADL, no pillow under knee, and use of ice for pain management.     Vision Vision Assessment?: No apparent visual deficits          Pertinent Vitals/Pain Pain Assessment: Faces Pain Score:  (Reported 5 prior to standing) Faces Pain Scale: Hurts worst (Following standing) Pain Location: R knee Pain Descriptors / Indicators: Aching;Grimacing;Crying;Discomfort;Operative site guarding;Sore Pain Intervention(s): Limited activity within patient's tolerance;Monitored during session;Repositioned;Ice applied;Patient requesting pain meds-RN notified     Hand Dominance Right   Extremity/Trunk Assessment Upper Extremity Assessment Upper Extremity Assessment: Overall WFL for tasks assessed   Lower Extremity Assessment Lower Extremity Assessment: RLE deficits/detail RLE Deficits / Details: Decreased strength and ROM as well as post-operative  pain. RLE: Unable to fully assess due to pain       Communication Communication Communication: No difficulties    Cognition Arousal/Alertness: Awake/alert Behavior During Therapy: WFL for tasks assessed/performed Overall Cognitive Status: Within Functional Limits for tasks assessed                                Home Living Family/patient expects to be discharged to:: Private residence Living Arrangements: Children Available Help at Discharge: Family;Home health Type of Home: House Home Access: Stairs to enter Entergy CorporationEntrance Stairs-Number of Steps: 2 Entrance Stairs-Rails: None Home Layout: Two level;Other (Comment) (bedroom and bathroom on main level) Alternate Level Stairs-Number of Steps: does not go upstairs Alternate Level Stairs-Rails: Can reach both Bathroom Shower/Tub: Tub/shower unit Shower/tub characteristics: Curtain FirefighterBathroom Toilet: Standard     Home Equipment: Environmental consultantWalker - 2 wheels;Cane - single point;Bedside commode;Shower seat;Hand held shower head          Prior Functioning/Environment Level of Independence: Independent        Comments: self employed cleaning homes        OT Problem List: Decreased strength;Decreased range of motion;Decreased activity tolerance;Impaired balance (sitting and/or standing);Decreased safety awareness;Decreased knowledge of use of DME or AE;Decreased knowledge of precautions;Pain   OT Treatment/Interventions: Self-care/ADL training;Therapeutic exercise;DME and/or AE instruction;Therapeutic activities;Patient/family education;Balance training    OT Goals(Current goals can be found in the care plan section) Acute Rehab OT Goals Patient Stated Goal: to have less pain OT Goal Formulation: With patient/family Time For Goal Achievement: 03/30/16 Potential to Achieve Goals: Good ADL Goals Pt Will Perform Lower Body Bathing: sit to/from stand;with supervision Pt Will Perform Lower Body Dressing: with supervision;sit to/from stand Pt Will Transfer to Toilet: with supervision;regular height toilet;ambulating Pt Will Perform Toileting -  Clothing Manipulation and hygiene: with supervision;sit to/from stand Pt Will Perform Tub/Shower Transfer: with min guard assist;shower seat;ambulating;rolling walker  OT Frequency: Min 2X/week    End of Session Equipment Utilized During Treatment: Gait belt;Rolling walker Nurse Communication: Patient requests pain meds  Activity Tolerance: Patient limited by pain Patient left: in bed;with call bell/phone within reach;with family/visitor present   Time: [id number] OT Time Calculation (min): 28 min Charges:  OT General Charges $OT Visit: 1 Procedure OT Evaluation $OT Eval Moderate Complexity: 1 Procedure OT Treatments $Self Care/Home Management : 8-22 mins  Doristine SectionCharity A Dola Lunsford, OTR/L [phone] 03/16/2016, 3:20 PM

## 2016-03-16 NOTE — H&P (Addendum)
TOTAL KNEE ADMISSION H&P  Patient is being admitted for right total knee arthroplasty.  Subjective:  Chief Complaint:right knee pain.  HPI: Valerie Love, 60 y.o. female, has a history of pain and functional disability in the right knee due to arthritis and has failed non-surgical conservative treatments for greater than 12 weeks to includeNSAID's and/or analgesics, corticosteriod injections and activity modification.  Onset of symptoms was gradual, starting 8 years ago with gradually worsening course since that time. The patient noted no past surgery on the right knee(s).  Patient currently rates pain in the right knee(s) at 8 out of 10 with activity. Patient has night pain, worsening of pain with activity and weight bearing, pain that interferes with activities of daily living, pain with passive range of motion, crepitus and joint swelling.  Patient has evidence of subchondral cysts, subchondral sclerosis, periarticular osteophytes, joint subluxation and joint space narrowing by imaging studies. This patient has had avascular necrosis of the knee. There is no active infection.  Patient Active Problem List   Diagnosis Date Noted  . Special screening for malignant neoplasms, colon 12/29/2014  . Diarrhea 12/29/2014  . Medication management 05/13/2013  . Does not have health insurance 05/13/2013  . Adjustment reaction with anxiety and depression 03/12/2012  . Medication monitoring encounter 03/12/2012  . URI, acute 08/13/2011  . Hemorrhoids 08/13/2011  . Perineal itching, female 08/13/2011  . LBBB (left bundle branch block) 09/24/2010  . Murmur 09/08/2010  . EKG, abnormal 08/19/2010  . Vaginitis and vulvovaginitis 08/19/2010  . Visit for preventive health examination 08/19/2010  . Rapid palpitations 08/18/2010  . Dizziness 08/18/2010  . Arthritis 08/18/2010  . ANXIETY 06/18/2008  . GRIEF REACTION 06/18/2008  . DEPRESSION, MAJOR, RECURRENT 07/06/2006  . TOBACCO DEPENDENCE 07/06/2006  .  ARTHRALGIA, UNSPECIFIED 07/06/2006   Past Medical History:  Diagnosis Date  . Anxiety   . Arthritis    R knee, back, hands   . Childhood asthma   . Complication of anesthesia    woke up slowly - 27yrs. ago  . Depression    related to husband illness and death  . History of jaundice    as a child    . Hx of varicella   . Hyperthyroidism    during pregnancy, treated & resolved post partum   . LBBB (left bundle branch block)   . Thyroid disease in pregnancy     Past Surgical History:  Procedure Laterality Date  . BREAST BIOPSY Left 80  . BREAST BIOPSY Right 1979   benign   . Cyst removed from ovary    . KNEE SURGERY     right Kischa Altice   . TONSILLECTOMY AND ADENOIDECTOMY  1073  . TUBAL LIGATION      Prescriptions Prior to Admission  Medication Sig Dispense Refill Last Dose  . ALPRAZolam (XANAX) 0.5 MG tablet 1 po as needed, maximum 3 x per day , avoid regular use: Do not take with alcohol (Patient taking differently: Take 0.5 mg by mouth 3 (three) times daily as needed for anxiety. ) 30 tablet 0 Past Month at Unknown time  . aspirin EC 81 MG tablet Take 81 mg by mouth daily.   Past Week at Unknown time  . calcium carbonate (OS-CAL) 600 MG TABS tablet Take 600 mg by mouth daily.   Past Week at Unknown time  . cholecalciferol (VITAMIN D) 400 units TABS tablet Take 800 Units by mouth daily.   Past Week at Unknown time  . citalopram (CELEXA) 20  MG tablet TAKE 1 TABLET BY MOUTH DAILY (Patient taking differently: TAKE 1 TABLET BY MOUTH DAILY AT BEDTIME) 90 tablet 1 03/15/2016 at Unknown time  . dicyclomine (BENTYL) 10 MG capsule Take 1 capsule (10 mg total) by mouth every 6 (six) hours as needed for spasms. (Patient taking differently: Take 10 mg by mouth every 6 (six) hours as needed (abdominal spasms). ) 60 capsule 3 Past Month at Unknown time  . diphenhydramine-acetaminophen (TYLENOL PM) 25-500 MG TABS tablet Take 1-2 tablets by mouth at bedtime.   03/15/2016 at Unknown time  . Multiple  Vitamins-Minerals (ALIVE ONCE DAILY WOMENS 50+) TABS Take 1 tablet by mouth daily.   Past Week at Unknown time  . naproxen sodium (ANAPROX) 220 MG tablet Take 440 mg by mouth daily.   Past Week at Unknown time  . Probiotic Product (TRUBIOTICS) CAPS Take 1 capsule by mouth daily.   Past Week at Unknown time  . Turmeric 500 MG TABS Take 500 mg by mouth daily.   Past Week at Unknown time  . vitamin B-12 (CYANOCOBALAMIN) 500 MCG tablet Take 500 mcg by mouth daily.   Past Week at Unknown time  . albuterol (PROVENTIL HFA;VENTOLIN HFA) 108 (90 BASE) MCG/ACT inhaler Inhale 2 puffs into the lungs every 6 (six) hours as needed for wheezing or shortness of breath. (Patient not taking: Reported on 03/07/2016) 1 Inhaler 1 Not Taking at Unknown time  . diphenhydrAMINE (BENADRYL) 25 MG tablet Take 25 mg by mouth at bedtime as needed for sleep.   More than a month at Unknown time  . escitalopram (LEXAPRO) 10 MG tablet Take 1 tablet (10 mg total) by mouth daily. (Patient not taking: Reported on 03/07/2016) 90 tablet 3 Not Taking at Unknown time   Allergies  Allergen Reactions  . Codeine Itching, Swelling and Rash    SWELLING REACTION UNSPECIFIED     Social History  Substance Use Topics  . Smoking status: Former Smoker    Packs/day: 0.25    Years: 40.00    Types: Cigarettes    Quit date: 01/06/2016  . Smokeless tobacco: Never Used     Comment: none in about 1 week  . Alcohol use 0.0 oz/week     Comment: socially-wine- daily    Family History  Problem Relation Age of Onset  . Alcohol abuse Mother     died from stomach ulcers  . Stroke Father     died  from cva and mi  . Hypertension Father   . Hyperlipidemia Father   . Coronary artery disease Father     Age 60  . Colon polyps Sister   . Hepatitis C Brother   . Colon cancer Neg Hx      Review of Systems  All other systems reviewed and are negative.   Objective:  Physical Exam on examination patient is alert oriented no adenopathy  well-dressed normal respiratory effort she does have have an antalgic gait she has valgus alignment to the right knee she is crepitation range of monumerous collaterals and cruciate are stable she is tender to palpation in all 3 compartments of the knee. There is no redness no cellulitis no signs of infection.  Vital signs in last 24 hours:    Labs:   Estimated body mass index is 29.97 kg/m as calculated from the following:   Height as of 03/08/16: 5\' 1"  (1.549 m).   Weight as of 03/08/16: 158 lb 9.6 oz (71.9 kg).   Imaging Review Plain radiographs demonstrate  moderate degenerative joint disease of the right knee(s). The overall alignment ismild varus. The bone quality appears to be adequate for age and reported activity level.  Assessment/Plan:  End stage arthritis, right knee   The patient history, physical examination, clinical judgment of the provider and imaging studies are consistent with end stage degenerative joint disease of the right knee(s) and total knee arthroplasty is deemed medically necessary. The treatment options including medical management, injection therapy arthroscopy and arthroplasty were discussed at length. The risks and benefits of total knee arthroplasty were presented and reviewed. The risks due to aseptic loosening, infection, stiffness, patella tracking problems, thromboembolic complications and other imponderables were discussed. The patient acknowledged the explanation, agreed to proceed with the plan and consent was signed. Patient is being admitted for inpatient treatment for surgery, pain control, PT, OT, prophylactic antibiotics, VTE prophylaxis, progressive ambulation and ADL's and discharge planning. The patient is planning to be discharged home with home health services

## 2016-03-16 NOTE — Op Note (Signed)
DATE OF SURGERY:  03/16/2016  TIME: 10:07 AM  PATIENT NAME:  Valerie Love    AGE: 60 y.o.    PRE-OPERATIVE DIAGNOSIS:  Osteoarthritis Right Knee  POST-OPERATIVE DIAGNOSIS:  same   PROCEDURE:  Procedure(s): TOTAL KNEE ARTHROPLASTY  SURGEON: Aldean Baker  ASSISTANT: April Green  OPERATIVE IMPLANTS: Depuy , Posterior Stabilized.  Femur size 5 narrow, Tibia size 3, Patella size 32 mm 3-peg oval button, with a 5 mm polyethylene insert.   PREOPERATIVE INDICATIONS:   Valerie Love is a 61 y.o. year old female with end stage degenerative arthritis of the knee who failed conservative treatment and elected for Total Knee Arthroplasty.   The risks, benefits, and alternatives were discussed at length including but not limited to the risks of infection, bleeding, nerve injury, stiffness, blood clots, the need for revision surgery, cardiopulmonary complications, among others, and they were willing to proceed.  OPERATIVE DESCRIPTION:  The patient was brought to the operative room and placed in a supine position.  General anesthesia was administered.  IV antibiotics were given.  The lower extremity was prepped and draped in the usual sterile fashion.  Collier Flowers was used to cover all exposed skin. Time out was performed.    Anterior quadriceps tendon splitting approach was performed.  The patella was everted and osteophytes were removed.  The anterior horn of the medial and lateral meniscus was removed.   The distal femur was opened with the drill and the intramedullary distal femoral cutting jig was utilized, set at 5 degrees valgus resecting 10 mm off the distal femur.  Care was taken to protect the collateral ligaments.  Then the extramedullary tibial cutting jig was utilized making the appropriate cut using the anterior tibial crest as a reference building in appropriate posterior slope.  Care was taken during the cut to protect the medial and collateral ligaments.  The proximal tibia was  removed along with the posterior horns of the menisci.  The PCL was sacrificed.    The extensor gap was measured and was approximately 76mm.    The distal femoral sizing jig was applied, taking care to avoid notching.  Then the 4-in-1 cutting jig was applied and the anterior and posterior femur was cut, along with the chamfer cuts.  All posterior osteophytes were removed.  The flexion gap was then measured and was symmetric with the extension gap.  The distal femoral preparation using the appropriate jig to prepare the box.  The patella was then measured, and cut with the saw.    The proximal tibia sized and prepared accordingly with the reamer and the punch, and then all components were trialed with the 52mm poly insert.  The knee was found to have stable balance and full motion.  The knee was irrigated with normal saline and the knee was soaked with TXA.  The above named components were then cemented into place and all excess cement was removed.  The final polyethylene component was in place during cementation.  The knee was  taken through a range of motion and the patella tracked well and the knee irrigated copiously and the parapatellar and subcutaneous tissue closed with vicryl, and skin closed with staples..  A sterile dressing was applied and patient  was taken to the PACU in stable  condition.  There were no complications.  Total tourniquet time was approximately 30 minutes.

## 2016-03-16 NOTE — Evaluation (Signed)
Physical Therapy Evaluation Patient Details Name: Valerie Love MRN: [id number] DOB: [birth year] Today's Date: 03/16/2016   History of Present Illness  Pt is a 60 yo female admitted on 03/16/16 for right TKA. PMH significant for LBBB, recurrent depression, and anxiety.  Clinical Impression  Pt is POD 0 and moving well with Pt. Pt is limited by pain and nausea with transitioning from sit to stand. Daughter is present throughout session to assist with questions. 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 Pt will benefit from skilled PT services to address the below deficits in order to assist with smooth transition home.    Follow Up Recommendations Home health PT    Equipment Recommendations  None recommended by PT (all equipment in home per patient)    Recommendations for Other Services       Precautions / Restrictions Precautions Precautions: Knee Precaution Booklet Issued: No Precaution Comments: reviewed no pillow under knee Restrictions Weight Bearing Restrictions: Yes RLE Weight Bearing: Weight bearing as tolerated      Mobility  Bed Mobility Overal bed mobility: Needs Assistance Bed Mobility: Supine to Sit;Sit to Supine     Supine to sit: Min assist Sit to supine: Min assist   General bed mobility comments: Min assist for B LE's into and out of bed.  Transfers Overall transfer level: Needs assistance Equipment used: Rolling walker (2 wheeled) Transfers: Sit to/from Stand Sit to Stand: Min assist         General transfer comment: Min a for safety due to nausea and light headness upon standing  Ambulation/Gait             General Gait Details: Unable to perform this session, will revisit a next session  Stairs            Wheelchair Mobility    Modified Rankin (Stroke Patients Only)       Balance Overall balance assessment: Needs assistance Sitting-balance support: No upper extremity  supported Sitting balance-Leahy Scale: Good     Standing balance support: Bilateral upper extremity supported Standing balance-Leahy Scale: Poor Standing balance comment: reliant on RW for support in standing                             Pertinent Vitals/Pain Pain Assessment: 0-10 Pain Score: 7  Faces Pain Scale: Hurts worst (Following standing) Pain Location: Right Knee Pain Descriptors / Indicators: Aching;Grimacing;Sore Pain Intervention(s): Monitored during session;Limited activity within patient's tolerance;Repositioned;Ice applied    Home Living Family/patient expects to be discharged to:: Private residence Living Arrangements: Children Available Help at Discharge: Family;Home health Type of Home: House Home Access: Stairs to enter Entrance Stairs-Rails: None Entrance Stairs-Number of Steps: 2 Home Layout: Two level;Other (Comment) (bedroom and bathroom on main level) Home Equipment: Walker - 2 wheels;Cane - single point;Bedside commode;Shower seat;Hand held shower head      Prior Function Level of Independence: Independent         Comments: self employed cleaning homes     Hand Dominance   Dominant Hand: Right    Extremity/Trunk Assessment   Upper Extremity Assessment: Defer to OT evaluation           Lower Extremity Assessment: RLE deficits/detail RLE Deficits / Details: pt with normal post op pain and weakness. At least 3/5 ankle. 2/5 knee and 2/5 hip per gross functional assessment       Communication  Communication: No difficulties  Cognition Arousal/Alertness: Awake/alert Behavior During Therapy: WFL for tasks assessed/performed Overall Cognitive Status: Within Functional Limits for tasks assessed                      General Comments      Exercises Total Joint Exercises Ankle Circles/Pumps: AROM;Both;20 reps;Supine   Assessment/Plan    PT Assessment Patient needs continued PT services  PT Problem List Decreased  strength;Decreased activity tolerance;Decreased range of motion;Decreased balance;Decreased mobility;Decreased knowledge of use of DME;Pain          PT Treatment Interventions DME instruction;Gait training;Stair training;Functional mobility training;Therapeutic activities;Therapeutic exercise;Balance training;Patient/family education    PT Goals (Current goals can be found in the Care Plan section)  Acute Rehab PT Goals Patient Stated Goal: to have less pain and get home PT Goal Formulation: With patient/family Time For Goal Achievement: 03/23/16 Potential to Achieve Goals: Good    Frequency 7X/week   Barriers to discharge        Co-evaluation               End of Session Equipment Utilized During Treatment: Gait belt Activity Tolerance: Patient limited by fatigue;Patient limited by pain Patient left: in bed;with call bell/phone within reach;with family/visitor present;with SCD's reapplied Nurse Communication: Mobility status         Time: 1400-1435 PT Time Calculation (min) (ACUTE ONLY): 35 min   Charges:   PT Evaluation $PT Eval Low Complexity: 1 Procedure PT Treatments $Therapeutic Activity: 8-22 mins   PT G Codes:        Colin BroachSabra M. Teruko Joswick PT, DPT  [phone]  03/16/2016, 3:58 PM

## 2016-03-16 NOTE — Anesthesia Procedure Notes (Signed)
Procedure Name: MAC Date/Time: 03/16/2016 8:40 AM Performed by: Roney Mans P Pre-anesthesia Checklist: Patient identified, Emergency Drugs available, Suction available, Patient being monitored and Timeout performed Patient Re-evaluated:Patient Re-evaluated prior to inductionOxygen Delivery Method: Nasal cannula Intubation Type: IV induction Placement Confirmation: positive ETCO2 Dental Injury: Teeth and Oropharynx as per pre-operative assessment

## 2016-03-16 NOTE — Progress Notes (Signed)
   03/16/16 1600  Clinical Encounter Type  Visited With Patient and family together  Visit Type Initial;Psychological support;Spiritual support;Social support  Referral From Chaplain  Spiritual Encounters  Spiritual Needs Prayer;Emotional  Stress Factors  Patient Stress Factors Health changes  Family Stress Factors Family relationships;Health changes  Visited with pt and 2 family members. Although pt just had surgery today, she was sitting up in bed, speaking cheerfully. Provided emotional/spiritual support and prayer. Although pt's chart lists her religion as Catholic, pt identified her denomination as [employer]. Orthoptist available for follow-up.

## 2016-03-16 NOTE — Anesthesia Procedure Notes (Signed)
Spinal  Patient location during procedure: OR Staffing Anesthesiologist: Kalayla Shadden Performed: anesthesiologist  Preanesthetic Checklist Completed: patient identified, site marked, surgical consent, pre-op evaluation, timeout performed, IV checked, risks and benefits discussed and monitors and equipment checked Spinal Block Patient position: sitting Prep: Betadine Patient monitoring: heart rate, continuous pulse ox and blood pressure Approach: right paramedian Location: L4-5 Injection technique: single-shot Needle Needle type: Spinocan  Needle gauge: 22 G Needle length: 9 cm Additional Notes Expiration date of kit checked and confirmed. Patient tolerated procedure well, without complications.       

## 2016-03-16 NOTE — Transfer of Care (Signed)
Immediate Anesthesia Transfer of Care Note  Patient: Valerie Love  Procedure(s) Performed: Procedure(s): TOTAL KNEE ARTHROPLASTY (Right)  Patient Location: PACU  Anesthesia Type:MAC  Level of Consciousness: awake, alert , oriented and patient cooperative  Airway & Oxygen Therapy: Patient Spontanous Breathing and Patient connected to nasal cannula oxygen  Post-op Assessment: Report given to RN and Post -op Vital signs reviewed and stable  Post vital signs: Reviewed and stable  Last Vitals:  Vitals:   03/16/16 0738 03/16/16 0740  BP: (!) 145/85 (!) (P) 145/86  Pulse: 65   Resp: 20   Temp: 36.9 C     Last Pain:  Vitals:   03/16/16 0738  TempSrc: Oral  PainSc:       Patients Stated Pain Goal: 3 (03/16/16 0645)  Complications: No apparent anesthesia complications

## 2016-03-16 NOTE — Anesthesia Preprocedure Evaluation (Signed)
Anesthesia Evaluation  Patient identified by MRN, date of birth, ID band Patient awake    Reviewed: Allergy & Precautions, NPO status , Patient's Chart, lab work & pertinent test results  Airway Mallampati: II  TM Distance: >3 FB Neck ROM: Full    Dental no notable dental hx.    Pulmonary asthma , former smoker,    Pulmonary exam normal breath sounds clear to auscultation       Cardiovascular Normal cardiovascular exam Rhythm:Regular Rate:Normal  LBBB   Neuro/Psych negative neurological ROS  negative psych ROS   GI/Hepatic negative GI ROS, Neg liver ROS,   Endo/Other  negative endocrine ROS  Renal/GU negative Renal ROS  negative genitourinary   Musculoskeletal negative musculoskeletal ROS (+)   Abdominal   Peds negative pediatric ROS (+)  Hematology negative hematology ROS (+)   Anesthesia Other Findings   Reproductive/Obstetrics negative OB ROS                             Anesthesia Physical Anesthesia Plan  ASA: II  Anesthesia Plan: Spinal   Post-op Pain Management:    Induction:   Airway Management Planned: Simple Face Mask  Additional Equipment:   Intra-op Plan:   Post-operative Plan:   Informed Consent: I have reviewed the patients History and Physical, chart, labs and discussed the procedure including the risks, benefits and alternatives for the proposed anesthesia with the patient or authorized representative who has indicated his/her understanding and acceptance.   Dental advisory given  Plan Discussed with: CRNA  Anesthesia Plan Comments:         Anesthesia Quick Evaluation

## 2016-03-17 ENCOUNTER — Encounter (HOSPITAL_COMMUNITY): Payer: Self-pay

## 2016-03-17 NOTE — Progress Notes (Signed)
Physical Therapy Treatment Patient Details Name: Valerie Love: [id number] DOB: [birth year] Today's Date: 03/17/2016    History of Present Illness Pt is a 60 yo female admitted on 03/16/16 for right TKA. PMH significant for LBBB, recurrent depression, and anxiety.    PT Comments    Pt pod 1 and moving better with therapy today. Performed initial ROM measurements with definite limitations into knee flex and ext noted this visit. Performed gait x 120' as compared to 100' early today. Continues to be limited by pain, but is cooperative with all therapy instructions. Instructed on supine exercises and advised pt to perform at least 3x daily.    Follow Up Recommendations  Home health PT     Equipment Recommendations  None recommended by PT    Recommendations for Other Services       Precautions / Restrictions Precautions Precautions: Knee Precaution Booklet Issued: No Precaution Comments: reviewed no pillow under knee Restrictions Weight Bearing Restrictions: Yes RLE Weight Bearing: Weight bearing as tolerated    Mobility  Bed Mobility Overal bed mobility: Needs Assistance Bed Mobility: Supine to Sit     Supine to sit: Min guard Sit to supine: Min guard   General bed mobility comments: Min gaurd for safety with cues for moving RLE into bed independently  Transfers Overall transfer level: Needs assistance Equipment used: Rolling walker (2 wheeled) Transfers: Sit to/from Stand Sit to Stand: Min guard         General transfer comment: Min guard for safety from EOB  Ambulation/Gait Ambulation/Gait assistance: Min assist Ambulation Distance (Feet): 120 Feet Assistive device: Rolling walker (2 wheeled) Gait Pattern/deviations: Step-to pattern;Decreased step length - left;Decreased stance time - right;Antalgic Gait velocity: decreased Gait velocity interpretation: Below normal speed for age/gender General Gait Details: Min A for safety and to maneuver IV Pole.  Moderate antalgia noted with decreased heel strike on RLE requiring cues to improve sequencing   Stairs            Wheelchair Mobility    Modified Rankin (Stroke Patients Only)       Balance Overall balance assessment: Needs assistance Sitting-balance support: No upper extremity supported;Feet supported Sitting balance-Leahy Scale: Good     Standing balance support: No upper extremity supported Standing balance-Leahy Scale: Fair Standing balance comment: Can stand statically without UE support, but requires UE for dynamic balance and gait initiation                    Cognition Arousal/Alertness: Awake/alert Behavior During Therapy: WFL for tasks assessed/performed Overall Cognitive Status: Within Functional Limits for tasks assessed                      Exercises Total Joint Exercises Ankle Circles/Pumps: AROM;Both;20 reps;Supine Quad Sets: AROM;Right;10 reps;Supine Heel Slides: AROM;Right;10 reps;Supine Straight Leg Raises: AROM;Right;10 reps;Supine Goniometric ROM: 5-55    General Comments        Pertinent Vitals/Pain Pain Assessment: 0-10 Pain Score: 7  Faces Pain Scale: Hurts even more Pain Location: right knee Pain Descriptors / Indicators: Aching;Guarding Pain Intervention(s): Monitored during session;Repositioned;Patient requesting pain meds-RN notified    Home Living                      Prior Function            PT Goals (current goals can now be found in the care plan section) Acute Rehab PT Goals Patient Stated Goal: to have less pain  and get home Progress towards PT goals: Progressing toward goals    Frequency    7X/week      PT Plan Current plan remains appropriate    Co-evaluation             End of Session           Time: 6712-4580 PT Time Calculation (min) (ACUTE ONLY): 31 min  Charges:  $Gait Training: 8-22 mins $Therapeutic Exercise: 8-22 mins                    G Codes:       Colin Broach PT, DPT  [phone]  03/17/2016, 3:36 PM

## 2016-03-17 NOTE — Care Management Note (Signed)
Case Management Note  Patient Details  Name: Valerie Love MRN: [id number] Date of Birth: December 06, 1955  Subjective/Objective:  60 yr old female s/p right total knee arthroplasty.                  Action/Plan: Case manager spoke with patient and her daughter concerning Discharge plan.Choice was offered for Home Health agency, patient states she has used [facility] with her husband and wants to use them. Case manager spoke with Janeice Robinson and gave referral for Home Health PT. Patient states she has rolling walker and 3in1and a shower chair.  She will have family support at discharge.    Expected Discharge Date:    03/18/16              Expected Discharge Plan:   Home with Home Health  In-House Referral:     Discharge planning Services     Post Acute Care Choice:    Choice offered to:     DME Arranged:   NA DME Agency:     HH Arranged:   PT HH Agency:   [facility]  Status of Service:   Completed  If discussed at Long Length of Stay Meetings, dates discussed:    Additional Comments:  Durenda Guthrie, RN 03/17/2016, 10:39 AM

## 2016-03-17 NOTE — Progress Notes (Signed)
Physical Therapy Treatment Patient Details Name: Valerie Love MRN: [id number] DOB: 08-21-1955 Today's Date: 03/17/2016    History of Present Illness Pt is a 60 yo female admitted on 03/16/16 for right TKA. PMH significant for LBBB, recurrent depression, and anxiety.    PT Comments    Pt presents with improved tolerance for gait and transfers this visit. Performed gait with RW requiring mod cues for heel strike and increased knee flexion during swing phase on RLE in order to more normalize pattern. Pt continues to report pain at 7-8/10 consistently, but is cooperative with all therapy tasks.    Follow Up Recommendations  Home health PT     Equipment Recommendations  None recommended by PT    Recommendations for Other Services       Precautions / Restrictions Precautions Precautions: Knee Precaution Booklet Issued: No Precaution Comments: reviewed no pillow under knee Restrictions Weight Bearing Restrictions: Yes RLE Weight Bearing: Weight bearing as tolerated    Mobility  Bed Mobility Overal bed mobility: Needs Assistance Bed Mobility: Supine to Sit     Supine to sit: Min guard     General bed mobility comments: Min guard for safety with cues for moving RLE into bed independently  Transfers Overall transfer level: Needs assistance Equipment used: Rolling walker (2 wheeled) Transfers: Sit to/from Stand Sit to Stand: Min guard         General transfer comment: Min guard for safety from EOB to RW  Ambulation/Gait Ambulation/Gait assistance: Min assist Ambulation Distance (Feet): 100 Feet Assistive device: Rolling walker (2 wheeled) Gait Pattern/deviations: Step-to pattern;Decreased step length - left;Decreased stance time - right;Antalgic Gait velocity: decreased Gait velocity interpretation: Below normal speed for age/gender General Gait Details: Moderate antalgia noted with decreased heel strike on RLE requiring cues to improve sequencing   Stairs             Wheelchair Mobility    Modified Rankin (Stroke Patients Only)       Balance Overall balance assessment: Needs assistance Sitting-balance support: No upper extremity supported Sitting balance-Leahy Scale: Good     Standing balance support: No upper extremity supported Standing balance-Leahy Scale: Fair Standing balance comment: requires supervision without RW                    Cognition Arousal/Alertness: Awake/alert Behavior During Therapy: WFL for tasks assessed/performed Overall Cognitive Status: Within Functional Limits for tasks assessed                      Exercises Total Joint Exercises Ankle Circles/Pumps: AROM;Both;20 reps;Supine Quad Sets: AROM;Right;10 reps;Supine Heel Slides: AROM;Right;10 reps;Supine    General Comments        Pertinent Vitals/Pain Pain Assessment: 0-10 Pain Score: 5  Pain Location: right knee Pain Descriptors / Indicators: Aching;Guarding Pain Intervention(s): Monitored during session;Premedicated before session;Ice applied    Home Living                      Prior Function            PT Goals (current goals can now be found in the care plan section) Acute Rehab PT Goals Patient Stated Goal: to have less pain and get home Progress towards PT goals: Progressing toward goals    Frequency    7X/week      PT Plan Current plan remains appropriate    Co-evaluation             End  of Session           Time: [id number] PT Time Calculation (min) (ACUTE ONLY): 30 min  Charges:  $Gait Training: 23-37 mins                    G Codes:      Colin BroachSabra M. Rashard Ryle PT, DPT  [phone]  03/17/2016, 1:51 PM

## 2016-03-17 NOTE — Progress Notes (Signed)
Patient ID: Valerie Love, female   DOB: 03/30/1956, 60 y.o.   MRN: [id number] Postoperative day 1 total knee arthroplasty right. Patient was ambulating last night. Patient complains of nausea and vomiting from the Percocet. She is currently on Ultram and Dilaudid. Plan for discharge to home tomorrow Friday.`

## 2016-03-17 NOTE — Progress Notes (Signed)
Occupational Therapy Treatment Patient Details Name: Valerie Love MRN: [id number] DOB: Jul 25, 1955 Today's Date: 03/17/2016    History of present illness Pt is a 60 yo female admitted on 03/16/16 for right TKA. PMH significant for LBBB, recurrent depression, and anxiety.   OT comments  Pt progressing well toward OT goals. Pt able to complete toilet transfer and toileting hygeine with min guard assist for safety and shower transfer with min assist this date. Educated pt and daughter on use of DME for tub transfer and they verbalize and demonstrate understanding. Pt would continue to benefit from OT services while admitted to improve independence with ADL. Updated equipment recommendations to 3-in-1 BSC. D/C plan remains appropriate. OT will continue to follow with focus on LB ADL with AE.  Follow Up Recommendations  Supervision/Assistance - 24 hour;No OT follow up    Equipment Recommendations  3 in 1 bedside comode       Precautions / Restrictions Precautions Precautions: Knee Precaution Booklet Issued: No Precaution Comments: reviewed no pillow under knee Restrictions Weight Bearing Restrictions: Yes RLE Weight Bearing: Weight bearing as tolerated       Mobility Bed Mobility Overal bed mobility: Needs Assistance Bed Mobility: Supine to Sit     Supine to sit: Min guard     General bed mobility comments: Min guard for safety with cues for moving RLE out of bed independently  Transfers Overall transfer level: Needs assistance Equipment used: Rolling walker (2 wheeled) Transfers: Sit to/from Stand Sit to Stand: Min guard         General transfer comment: Min guard for safety from EOB to RW    Balance Overall balance assessment: Needs assistance Sitting-balance support: No upper extremity supported;Feet supported Sitting balance-Leahy Scale: Good     Standing balance support: During functional activity;No upper extremity supported;Bilateral upper extremity  supported Standing balance-Leahy Scale: Fair Standing balance comment: Able to stand statically without UE support. Requires UE support for dynamic.                   ADL Overall ADL's : Needs assistance/impaired                         Toilet Transfer: Min guard;Ambulation;RW;Regular Toilet       Tub/ Shower Transfer: Tub transfer;Minimal assistance;Ambulation;3 in 1;Shower seat;Rolling walker   Functional mobility during ADLs: Min guard;Rolling walker General ADL Comments: Educated pt and duaghter on safe tub transfer and toilet transfer. Also discussed use of AE for LB ADL.                Cognition   Behavior During Therapy: WFL for tasks assessed/performed Overall Cognitive Status: Within Functional Limits for tasks assessed                         Exercises Total Joint Exercises Ankle Circles/Pumps: AROM;Both;20 reps;Supine Quad Sets: AROM;Right;10 reps;Supine Heel Slides: AROM;Right;10 reps;Supine           Pertinent Vitals/ Pain       Pain Assessment: Faces Pain Score: 5  Faces Pain Scale: Hurts even more Pain Location: R knee Pain Descriptors / Indicators: Aching;Operative site guarding Pain Intervention(s): Limited activity within patient's tolerance;Monitored during session;Repositioned;Ice applied         Frequency  Min 2X/week        Progress Toward Goals  OT Goals(current goals can now be found in the care plan section)  Progress towards  OT goals: Progressing toward goals  Acute Rehab OT Goals Patient Stated Goal: to have less pain and get home OT Goal Formulation: With patient/family Time For Goal Achievement: 03/30/16 Potential to Achieve Goals: Good ADL Goals Pt Will Perform Lower Body Bathing: sit to/from stand;with supervision Pt Will Perform Lower Body Dressing: with supervision;sit to/from stand Pt Will Transfer to Toilet: with supervision;regular height toilet;ambulating Pt Will Perform Toileting -  Clothing Manipulation and hygiene: with supervision;sit to/from stand Pt Will Perform Tub/Shower Transfer: with min guard assist;shower seat;ambulating;rolling walker  Plan Discharge plan remains appropriate       End of Session Equipment Utilized During Treatment: Gait belt;Rolling walker   Activity Tolerance Patient limited by pain   Patient Left with call bell/phone within reach;with family/visitor present;in chair   Nurse Communication Other (comment) (Pt needs 3-in-1)        Time: 1350-1436 OT Time Calculation (min): 46 min  Charges: OT General Charges $OT Visit: 1 Procedure OT Treatments $Self Care/Home Management : 38-52 mins  Doristine Section, OTR/L [phone] 03/17/2016, 3:08 PM

## 2016-03-18 MED ORDER — HYDROMORPHONE HCL 2 MG PO TABS: 2.0000 mg | ORAL_TABLET | Freq: Four times a day (QID) | ORAL | 0 refills | Status: DC | PRN

## 2016-03-18 NOTE — Progress Notes (Addendum)
Occupational Therapy Treatment/Discharge Patient Details Name: Valerie Love MRN: [id number] DOB: 10-18-1955 Today's Date: 03/18/2016    History of present illness Pt is a 60 yo female admitted on 03/16/16 for right TKA. PMH significant for LBBB, recurrent depression, and anxiety.   OT comments  Pt progressing well toward OT goals. On OT arrival, pt completing LB ADL with daughter in law following shower. Educated pt and daughter-in-law concerning use of reacher for LB dressing and pt was able to complete LB dressing this session with min assist. All education complete concerning energy conservation, fall prevention, ADL post-operatively, and tub transfers. Pt reports no further concerns or questions and plans to D/C home with family support this date. Family present and engaged in sessions and able to provide necessary assistance. D/C plan remains appropriate. Pt has no further acute OT needs. OT will sign off.   Follow Up Recommendations  Supervision/Assistance - 24 hour;No OT follow up    Equipment Recommendations  3 in 1 bedside comode       Precautions / Restrictions Precautions Precautions: Knee Restrictions Weight Bearing Restrictions: Yes RLE Weight Bearing: Weight bearing as tolerated       Mobility Bed Mobility Overal bed mobility: Needs Assistance Bed Mobility: Sit to Supine       Sit to supine: Supervision   General bed mobility comments: Pt OOB when pt arrives  Transfers Overall transfer level: Needs assistance Equipment used: Rolling walker (2 wheeled) Transfers: Sit to/from Stand Sit to Stand: Supervision         General transfer comment: Supervision for safety from lower surfaces    Balance   Sitting-balance support: No upper extremity supported;Feet supported Sitting balance-Leahy Scale: Good     Standing balance support: No upper extremity supported Standing balance-Leahy Scale: Fair Standing balance comment: Supervision for safety                    ADL Overall ADL's : Needs assistance/impaired                 Upper Body Dressing : Set up;Sitting   Lower Body Dressing: Minimal assistance;Sit to/from stand Lower Body Dressing Details (indicate cue type and reason): Educated pt and daughter in law concerning use of reacher for LB dressing. Pt able to complete with min assist. Continues to require assist to don/doff socks.               General ADL Comments: Pt completing dressing tasks with daughter in law on OT arrival. Pt and family educated on use of AE for LB ADL, use of ice for pain management, energy conservation, and fall prevention.                 Cognition   Behavior During Therapy: WFL for tasks assessed/performed Overall Cognitive Status: Within Functional Limits for tasks assessed                         Exercises Total Joint Exercises Long Arc Quad: AROM;10 reps;Right;Seated Knee Flexion: AROM;10 reps;Right;Seated           Pertinent Vitals/ Pain       Pain Assessment: Faces Pain Score: 3  Faces Pain Scale: Hurts even more Pain Location: R knee Pain Descriptors / Indicators: Aching;Sore Pain Intervention(s): Monitored during session;Ice applied         Frequency  Min 2X/week        Progress Toward Goals  OT Goals(current goals can  now be found in the care plan section)  Progress towards OT goals: Progressing toward goals  Acute Rehab OT Goals Patient Stated Goal: to have less pain and get home OT Goal Formulation: With patient/family Time For Goal Achievement: 03/30/16 Potential to Achieve Goals: Good ADL Goals Pt Will Perform Lower Body Bathing: sit to/from stand;with supervision Pt Will Perform Lower Body Dressing: with supervision;sit to/from stand Pt Will Transfer to Toilet: with supervision;regular height toilet;ambulating Pt Will Perform Toileting - Clothing Manipulation and hygiene: with supervision;sit to/from stand Pt Will Perform Tub/Shower  Transfer: with min guard assist;shower seat;ambulating;rolling walker  Plan Discharge plan remains appropriate       End of Session Equipment Utilized During Treatment: Other (comment) (Reacher)   Activity Tolerance Patient tolerated treatment well   Patient Left in bed;with call bell/phone within reach;with family/visitor present           Time: 4098-11910932-0947 OT Time Calculation (min): 15 min  Charges: OT General Charges $OT Visit: 1 Procedure OT Treatments $Self Care/Home Management : 8-22 mins  Doristine SectionCharity A Cullan Launer, OTR/L [phone] 03/18/2016, 10:05 AM

## 2016-03-18 NOTE — Progress Notes (Signed)
Physical Therapy Treatment Patient Details Name: Valerie Love MRN: [id number] DOB: [birth year] Today's Date: 03/18/2016    History of Present Illness Pt is a 60 yo female admitted on 03/16/16 for right TKA. PMH significant for LBBB, recurrent depression, and anxiety.    PT Comments    Pt is POD 2 and moving well with therapy. Performed gait training x 250' and stair training without railing backward with min cues for proper sequencing. Pt is making steady progress toward goals with decreased pain noted this visit and improved tolerance for knee flexion in seated with self movement. Pt is to DC home today with HHPT. Continues to make improvements toward goals.    Follow Up Recommendations  Home health PT     Equipment Recommendations  None recommended by PT    Recommendations for Other Services       Precautions / Restrictions Precautions Precautions: Knee Restrictions Weight Bearing Restrictions: Yes RLE Weight Bearing: Weight bearing as tolerated    Mobility  Bed Mobility               General bed mobility comments: Pt OOB when pt arrives  Transfers Overall transfer level: Needs assistance Equipment used: Rolling walker (2 wheeled) Transfers: Sit to/from Stand Sit to Stand: Supervision         General transfer comment: Supervision for safety from lower surfaces  Ambulation/Gait Ambulation/Gait assistance: Supervision Ambulation Distance (Feet): 250 Feet Assistive device: Rolling walker (2 wheeled) Gait Pattern/deviations: Step-to pattern;Decreased step length - left;Decreased stance time - right;Antalgic Gait velocity: decreased Gait velocity interpretation: Below normal speed for age/gender General Gait Details: Supervision for safety, mod antalgia noted and requires cues for proper sequncing on RLE   Stairs Stairs: Yes Stairs assistance: Min guard Stair Management: No rails;With walker;Backwards;Step to pattern Number of Stairs: 2 General stair  comments: Min guard for safety with RW, family present to educate on safety  Wheelchair Mobility    Modified Rankin (Stroke Patients Only)       Balance           Standing balance support: No upper extremity supported Standing balance-Leahy Scale: Fair Standing balance comment: Supervision for safety                    Cognition Arousal/Alertness: Awake/alert Behavior During Therapy: WFL for tasks assessed/performed Overall Cognitive Status: Within Functional Limits for tasks assessed                      Exercises Total Joint Exercises Long Arc Quad: AROM;10 reps;Right;Seated Knee Flexion: AROM;10 reps;Right;Seated    General Comments        Pertinent Vitals/Pain Pain Assessment: 0-10 Pain Score: 3  Pain Location: Right Knee Pain Descriptors / Indicators: Aching;Sore Pain Intervention(s): Monitored during session;Premedicated before session;Ice applied    Home Living                      Prior Function            PT Goals (current goals can now be found in the care plan section) Acute Rehab PT Goals Patient Stated Goal: to have less pain and get home Progress towards PT goals: Progressing toward goals    Frequency    7X/week      PT Plan Current plan remains appropriate    Co-evaluation             End of Session  Time: [id number] PT Time Calculation (min) (ACUTE ONLY): 31 min  Charges:  $Gait Training: 8-22 mins $Therapeutic Activity: 8-22 mins                    G Codes:      Colin BroachSabra M. Brynnleigh Mcelwee PT, DPT  [phone]  03/18/2016, 8:59 AM

## 2016-03-18 NOTE — Discharge Summary (Signed)
Discharge Diagnoses:  Active Problems:   Total knee replacement status, right   Unilateral primary osteoarthritis, right knee   Surgeries: Procedure(s): TOTAL KNEE ARTHROPLASTY on 03/16/2016    Consultants:  none Discharged Condition: Improved  Hospital Course: Valerie Love is an 60 y.o. female who was admitted 03/16/2016 with a chief complaint of right knee pain, with a final diagnosis of Osteoarthritis Right Knee.  Patient was brought to the operating room on 03/16/2016 and underwent Procedure(s): TOTAL KNEE ARTHROPLASTY.    Patient was given perioperative antibiotics: Anti-infectives    Start     Dose/Rate Route Frequency Ordered Stop   03/16/16 1500  ceFAZolin (ANCEF) IVPB 1 g/50 mL premix     1 g 100 mL/hr over 30 Minutes Intravenous Every 6 hours 03/16/16 1155 03/16/16 2146   03/16/16 0605  ceFAZolin (ANCEF) IVPB 2g/100 mL premix     2 g 200 mL/hr over 30 Minutes Intravenous On call to O.R. 03/16/16 [id number] 03/16/16 0840    .  Patient was given sequential compression devices, early ambulation, and aspirin for DVT prophylaxis.  Recent vital signs: Patient Vitals for the past 24 hrs:  BP Temp Temp src Pulse Resp SpO2  03/18/16 0501 113/63 100.2 F (37.9 C) Oral 89 16 98 %  03/18/16 0135 104/61 99.8 F (37.7 C) Oral 84 16 93 %  03/17/16 2029 117/68 100 F (37.8 C) Oral 97 16 94 %  03/17/16 1454 (!) 107/45 98.8 F (37.1 C) Oral 97 17 90 %  03/17/16 0640 (!) 105/55 99.2 F (37.3 C) Oral 85 16 94 %  .  Recent laboratory studies: No results found.  Discharge Medications:     Medication List    TAKE these medications   albuterol 108 (90 Base) MCG/ACT inhaler Commonly known as:  PROVENTIL HFA;VENTOLIN HFA Inhale 2 puffs into the lungs every 6 (six) hours as needed for wheezing or shortness of breath.   ALIVE ONCE DAILY WOMENS 50+ Tabs Take 1 tablet by mouth daily.   ALPRAZolam 0.5 MG tablet Commonly known as:  XANAX 1 po as needed, maximum 3 x per day , avoid regular  use: Do not take with alcohol What changed:  how much to take  how to take this  when to take this  reasons to take this  additional instructions   aspirin EC 81 MG tablet Take 81 mg by mouth daily.   calcium carbonate 600 MG Tabs tablet Commonly known as:  OS-CAL Take 600 mg by mouth daily.   cholecalciferol 400 units Tabs tablet Commonly known as:  VITAMIN D Take 800 Units by mouth daily.   citalopram 20 MG tablet Commonly known as:  CELEXA TAKE 1 TABLET BY MOUTH DAILY What changed:  See the new instructions.   dicyclomine 10 MG capsule Commonly known as:  BENTYL Take 1 capsule (10 mg total) by mouth every 6 (six) hours as needed for spasms. What changed:  reasons to take this   diphenhydrAMINE 25 MG tablet Commonly known as:  BENADRYL Take 25 mg by mouth at bedtime as needed for sleep.   diphenhydramine-acetaminophen 25-500 MG Tabs tablet Commonly known as:  TYLENOL PM Take 1-2 tablets by mouth at bedtime.   escitalopram 10 MG tablet Commonly known as:  LEXAPRO Take 1 tablet (10 mg total) by mouth daily.   HYDROmorphone 2 MG tablet Commonly known as:  DILAUDID Take 1 tablet (2 mg total) by mouth every 6 (six) hours as needed for moderate pain or severe pain.  naproxen sodium 220 MG tablet Commonly known as:  ANAPROX Take 440 mg by mouth daily.   TRUBIOTICS Caps Take 1 capsule by mouth daily.   Turmeric 500 MG Tabs Take 500 mg by mouth daily.   vitamin B-12 500 MCG tablet Commonly known as:  CYANOCOBALAMIN Take 500 mcg by mouth daily.       Diagnostic Studies: No results found.  Patient benefited maximally from their hospital stay and there were no complications.     Disposition: Final discharge disposition not confirmed Discharge Instructions    Call MD / Call 911    Complete by:  As directed    If you experience chest pain or shortness of breath, CALL 911 and be transported to the hospital emergency room.  If you develope a fever above  101 F, pus (white drainage) or increased drainage or redness at the wound, or calf pain, call your surgeon's office.   Constipation Prevention    Complete by:  As directed    Drink plenty of fluids.  Prune juice may be helpful.  You may use a stool softener, such as Colace (over the counter) 100 mg twice a day.  Use MiraLax (over the counter) for constipation as needed.   Diet - low sodium heart healthy    Complete by:  As directed    Increase activity slowly as tolerated    Complete by:  As directed      Follow-up Information    [name], MD Follow up in 2 week(s).   Specialty:  Orthopedic Surgery Contact information: [address] [place] [address] [phone]        [facility] Follow up.   Why:  Someone from [facility] will contact you to arrange start date and time for therapy. Contact information: [address] [place] [address] [phone]            Signed: [name] 03/18/2016, 6:35 AM

## 2016-03-18 NOTE — Progress Notes (Signed)
Discharge instructions reviewed with the patient and family to include Medications, prescriptions, follow up appointments and activity. Both voice understanding to teaching. To door via wheelchair.  Home via POV with her daughter driving.

## 2016-03-21 ENCOUNTER — Ambulatory Visit: Payer: Self-pay | Admitting: Internal Medicine

## 2016-03-30 ENCOUNTER — Ambulatory Visit (INDEPENDENT_AMBULATORY_CARE_PROVIDER_SITE_OTHER): Payer: BLUE CROSS/BLUE SHIELD | Admitting: Orthopedic Surgery

## 2016-03-30 ENCOUNTER — Ambulatory Visit (INDEPENDENT_AMBULATORY_CARE_PROVIDER_SITE_OTHER): Payer: Self-pay

## 2016-03-30 ENCOUNTER — Encounter (INDEPENDENT_AMBULATORY_CARE_PROVIDER_SITE_OTHER): Payer: Self-pay | Admitting: Orthopedic Surgery

## 2016-03-30 DIAGNOSIS — Z96651 Presence of right artificial knee joint: Secondary | ICD-10-CM

## 2016-03-30 NOTE — Progress Notes (Signed)
Office Visit Note   Patient: Valerie Love           Date of Birth: 07-02-1955           MRN: [id number] Visit Date: 03/30/2016              Requested by: Madelin Headings, MD [address] [place], [place] [address] PCP: Lorretta Harp, MD   Assessment & Plan: Visit Diagnoses:  1. S/P total knee arthroplasty, right     Plan: Set up with outpatient physical therapy at [facility] and [place] or [facility]. Patient lacks about 10 to full extension she is given instructions to work on extension exercises. Staples harvested today she is given instructions for scar massage  Follow-Up Instructions: Return in about 3 weeks (around 04/20/2016).   Orders:  Orders Placed This Encounter  Procedures  . XR Knee 1-2 Views Right   No orders of the defined types were placed in this encounter.     Procedures: No procedures performed   Clinical Data: No additional findings.   Subjective: No chief complaint on file.   Patient is status post right total knee arthroplasty 03/16/16. She is two weeks post op. She is doing well overall. She does have questions she has brought with her today. She is curious about a possible leg length discrepancy versus her scoliosis causing her to feel off balance during therapy exercises. She is ambulatory with a cane today. She is questioning also about transition to outpatient therapy as to when this would happen and where she should go.     Review of Systems   Objective: Vital Signs: There were no vitals taken for this visit.  Physical Exam examination incision is well-healed there is no redness no cellulitis no signs of its infection.  Ortho Exam  Specialty Comments:  No specialty comments available.  Imaging: Xr Knee 1-2 Views Right  Result Date: 03/30/2016 Two-view radiographs the right knee show stable alignment total knee arthroplasty. There is no varus or valgus malalignment no hyperextension or flexion  malalignment.    PMFS History: Patient Active Problem List   Diagnosis Date Noted  . 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 Unilateral primary osteoarthritis, right knee   . Special screening for malignant neoplasms, colon 12/29/2014  . Diarrhea 12/29/2014  . Medication management 05/13/2013  . Does not have health insurance 05/13/2013  . Adjustment reaction with anxiety and depression 03/12/2012  . Medication monitoring encounter 03/12/2012  . URI, acute 08/13/2011  . Hemorrhoids 08/13/2011  . Perineal itching, female 08/13/2011  . LBBB (left bundle branch block) 09/24/2010  . Murmur 09/08/2010  . EKG, abnormal 08/19/2010  . Vaginitis and vulvovaginitis 08/19/2010  . Visit for preventive health examination 08/19/2010  . Rapid palpitations 08/18/2010  . Dizziness 08/18/2010  . Arthritis 08/18/2010  . ANXIETY 06/18/2008  . GRIEF REACTION 06/18/2008  . DEPRESSION, MAJOR, RECURRENT 07/06/2006  . TOBACCO DEPENDENCE 07/06/2006  . ARTHRALGIA, UNSPECIFIED 07/06/2006   Past Medical History:  Diagnosis Date  . Anxiety   . Arthritis    R knee, back, hands   . Childhood asthma   . Complication of anesthesia    woke up slowly - 52yrs. ago  . Depression    related to husband illness and death  . History of jaundice    as a child    . Hx of varicella   . Hyperthyroidism    during pregnancy, treated & resolved post partum   . IBS (irritable  bowel syndrome)   . LBBB (left bundle branch block)   . Scoliosis   . Thyroid disease in pregnancy     Family History  Problem Relation Age of Onset  . Alcohol abuse Mother     died from stomach ulcers  . Stroke Father     died  from cva and mi  . Hypertension Father   . Hyperlipidemia Father   . Coronary artery disease Father     Age 363  . Colon polyps Sister   . Hepatitis C Brother   . Colon cancer Neg Hx     Past Surgical History:  Procedure Laterality Date  . BREAST BIOPSY Left 80  . BREAST BIOPSY Right 1979    benign   . Cyst removed from ovary    . KNEE SURGERY     right Karson Chicas   . TONSILLECTOMY AND ADENOIDECTOMY  1073  . TOTAL KNEE ARTHROPLASTY 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   Procedure: TOTAL KNEE ARTHROPLASTY;  Surgeon: Nadara MustardMarcus V Janely Gullickson, MD;  Location: [facility] OR;  Service: Orthopedics;  Laterality: Right;  . TUBAL LIGATION    . WISDOM TOOTH EXTRACTION     Social History   Occupational History  . self     Catering and cleaning   Social History Main Topics  . Smoking status: Former Smoker    Packs/day: 0.25    Years: 40.00    Types: Cigarettes    Quit date: 01/06/2016  . Smokeless tobacco: Never Used     Comment: none in about 1 week  . Alcohol use 0.0 oz/week     Comment: socially-wine- daily  . Drug use: No  . Sexual activity: Not on file

## 2016-04-05 ENCOUNTER — Ambulatory Visit: Payer: BLUE CROSS/BLUE SHIELD | Attending: Orthopedic Surgery

## 2016-04-05 DIAGNOSIS — R600 Localized edema: Secondary | ICD-10-CM | POA: Insufficient documentation

## 2016-04-05 DIAGNOSIS — M25561 Pain in right knee: Secondary | ICD-10-CM | POA: Diagnosis not present

## 2016-04-05 DIAGNOSIS — M6281 Muscle weakness (generalized): Secondary | ICD-10-CM | POA: Diagnosis present

## 2016-04-05 DIAGNOSIS — R2689 Other abnormalities of gait and mobility: Secondary | ICD-10-CM | POA: Insufficient documentation

## 2016-04-05 DIAGNOSIS — M25661 Stiffness of right knee, not elsewhere classified: Secondary | ICD-10-CM | POA: Diagnosis present

## 2016-04-05 NOTE — Therapy (Signed)
[facility] [address], [address] [place], [place], [address] Phone: [phone]   Fax:  [phone]  Physical Therapy Treatment  Patient Details  Name: Valerie KiltsJudy T Love MRN: [id number] Date of Birth: 1955/05/15 Referring Provider: Aldean Bakeruda, Marcus, MD  Encounter Date: 04/05/2016      PT End of Session - 04/05/16 1304    Visit Number 1   Date for PT Re-Evaluation 05/31/16   Authorization Type 30 visit limit in 2017(already used 7)   Authorization - Visit Number 1   Authorization - Number of Visits 23   PT Start Time 1220   PT Stop Time 1315   PT Time Calculation (min) 55 min   Activity Tolerance Patient tolerated treatment well   Behavior During Therapy [facility] for tasks assessed/performed      Past Medical History:  Diagnosis Date  . Anxiety   . Arthritis    R knee, back, hands   . Childhood asthma   . Complication of anesthesia    woke up slowly - 397yrs. ago  . Depression    related to husband illness and death  . History of jaundice    as a child    . Hx of varicella   . Hyperthyroidism    during pregnancy, treated & resolved post partum   . IBS (irritable bowel syndrome)   . LBBB (left bundle branch block)   . Scoliosis   . Thyroid disease in pregnancy     Past Surgical History:  Procedure Laterality Date  . BREAST BIOPSY Left 80  . BREAST BIOPSY Right 1979   benign   . Cyst removed from ovary    . KNEE SURGERY     right duda   . TONSILLECTOMY AND ADENOIDECTOMY  1073  . TOTAL KNEE ARTHROPLASTY 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   Procedure: TOTAL KNEE ARTHROPLASTY;  Surgeon: Nadara MustardMarcus V Duda, MD;  Location: [facility] OR;  Service: Orthopedics;  Laterality: Right;  . TUBAL LIGATION    . WISDOM TOOTH EXTRACTION      There were no vitals filed for this visit.      Subjective Assessment - 04/05/16 1218    Subjective Pt presents to PT s/p Rt TKA performed 03/16/16.  Pt had home health PT ending last week.     Pertinent History Rt  TKA: 03/16/16   Limitations Walking;Standing   How long can you walk comfortably? walking limited by fatigue 5-8 minutes   Patient Stated Goals improve gait, reduce knee pain, improve Rt knee A/ROM   Currently in Pain? Yes   Pain Score 3    Pain Location Knee   Pain Orientation Right   Pain Type Surgical pain   Pain Onset 1 to 4 weeks ago   Pain Frequency Intermittent   Aggravating Factors  activity, night    Pain Relieving Factors Aleve             [facility] PT Assessment - 04/05/16 0001      Assessment   Medical Diagnosis s/p Rt TKA   Referring Provider Aldean Bakeruda, Marcus, MD   Onset Date/Surgical Date 03/16/16   Next MD Visit 04/20/16     Precautions   Precautions None     Restrictions   Weight Bearing Restrictions No     Balance Screen   Has the patient fallen in the past 6 months No   Has the patient had a decrease in activity level because of a fear of falling?  No   Is the patient  reluctant to leave their home because of a fear of falling?  No     Home Environment   Living Environment Private residence   Type of Home House   Home Access Stairs to enter   Entrance Stairs-Number of Steps 3   Home Layout Able to live on main level with bedroom/bathroom;Two level   Home Equipment Cane - single point;Walker - 2 wheels;Shower seat     Prior Function   Level of Independence Independent   Vocation Part time employment   Paramedic   Overall Cognitive Status Within Functional Limits for tasks assessed     Observation/Other Assessments   Focus on Therapeutic Outcomes (FOTO)  59% limitation     Posture/Postural Control   Posture/Postural Control Postural limitations   Posture Comments scoliosis     ROM / Strength   AROM / PROM / Strength AROM;PROM;Strength     AROM   Overall AROM  Deficits   AROM Assessment Site Knee;Hip   Right/Left Hip Right;Left   Right/Left Knee Right;Left   Right Knee Extension 16   Right Knee Flexion  86   Left Knee Extension 0   Left Knee Flexion 130     PROM   Overall PROM  Deficits   PROM Assessment Site Knee   Right/Left Knee Right   Right Knee Extension 13   Right Knee Flexion 90     Strength   Overall Strength Deficits   Overall Strength Comments Lt hip and knee 4+/5 to 5/5   Strength Assessment Site Knee;Hip   Right/Left Hip Right   Right Hip Flexion 4/5   Right Hip ABduction 4/5   Right/Left Knee Right   Right Knee Flexion 4+/5   Right Knee Extension 4-/5  with lag     Flexibility   Soft Tissue Assessment /Muscle Length yes   Hamstrings Mid patellar edema: Lt 14.5, Rt 15.5 inches     Palpation   Patella mobility reduced patellar mobility in all directions on the Rt   Palpation comment well healing surgical incision.  7" long with reduced mobility along length of scar.  Warmth and eeema.       Transfers   Transfers Sit to Stand;Stand to Sit;Independent with all Transfers     Ambulation/Gait   Ambulation/Gait Yes   Ambulation/Gait Assistance 6: Modified independent (Device/Increase time)   Ambulation Distance (Feet) 100 Feet   Assistive device Straight cane   Gait Pattern Step-through pattern;Decreased stance time - right;Decreased stride length   Stairs Yes   Stairs Assistance 6: Modified independent (Device/Increase time)   Stair Management Technique One rail Right;Step to pattern                     [facility] Adult PT Treatment/Exercise - 04/05/16 0001      Exercises   Exercises Knee/Hip     Knee/Hip Exercises: Aerobic   Stationary Bike Level 0 for ROM: x 8 minutes     Modalities   Modalities Cryotherapy;Vasopneumatic     Vasopneumatic   Number Minutes Vasopneumatic  15 minutes   Vasopnuematic Location  Knee   Vasopneumatic Pressure Medium   Vasopneumatic Temperature  3 snowflakes                PT Education - 04/05/16 1251    Education provided Yes   Education Details scar massage, review of home health exercises    Methods Explanation;Handout;Demonstration   Comprehension Verbalized understanding  PT Short Term Goals - 04/05/16 1311      PT SHORT TERM GOAL #1   Title be independent in initial HEP   Time 4   Period Weeks   Status New     PT SHORT TERM GOAL #2   Title demonstrate Rt knee A/ROM extension to lacking < or = to 10 degrees to normalize gait   Time 4   Period Weeks   Status New     PT SHORT TERM GOAL #3   Title improve Rt LE strength to walk for 10-12 minutes without need to rest   Time 4   Period Weeks   Status New     PT SHORT TERM GOAL #4   Title ascend step with step-over-step gait with use of 1 rail > 50% of the time   Time 4   Period Weeks   Status New           PT Long Term Goals - 04/05/16 1217      PT LONG TERM GOAL #1   Title be independent in advanced HEP   Time 8   Period Weeks   Status New     PT LONG TERM GOAL #2   Title reduce FOTO to < or = to 48% limitation   Time 8   Period Weeks   Status New     PT LONG TERM GOAL #3   Title demonstrate Rt knee A/ROM flexion to > or = to 110 degrees to improve squatting and steps   Time 8   Period Weeks   Status New     PT LONG TERM GOAL #4   Title report < or = to 2/10 Rt knee pain with standing and walking   Time 8   Period Weeks   Status New     PT LONG TERM GOAL #5   Title improve Rt LE strength to descend steps with step-over-step gait > or = to 50% of the time   Time 8   Period Weeks   Status New     Additional Long Term Goals   Additional Long Term Goals Yes     PT LONG TERM GOAL #6   Title demonstrate Rt knee A/ROM extension to lacking < or = to 5 degrees to normalize gait pattern   Time 8   Period Weeks   Status New               Plan - 04/05/16 1306    Clinical Impression Statement Pt presents to PT s/p Rt TKA due to OA performed 03/16/16.  Pt demonstrates gait abnormality, reduced Rt knee A/ROM and P/ROM, edema, reduced scar and patallar mobility and weakness in  the Rt LE.  Pt is a low complexity evaluation due to lack of comorbidiites and stable condition.  Pt will benefit from skilled PT for edema and pain management, Rt knee A/ROM, strength progression and gait training.      Rehab Potential Good   PT Frequency 3x / week   PT Duration 8 weeks   PT Treatment/Interventions ADLs/Self Care Home Management;Cryotherapy;Electrical Stimulation;Iontophoresis 4mg /ml Dexamethasone;Moist Heat;Ultrasound;Gait training;Functional mobility training;Stair training;Patient/family education;Neuromuscular re-education;Therapeutic activities;Therapeutic exercise;Manual techniques;Passive range of motion;Vasopneumatic Device;Taping;Scar mobilization   PT Next Visit Plan Rt knee A/ROM, strength, mobs, P/ROM, Game Ready   Consulted and Agree with Plan of Care Patient      Patient will benefit from skilled therapeutic intervention in order to improve the following deficits and impairments:  Difficulty walking, Decreased  range of motion, Abnormal gait, Decreased endurance, Pain, Increased edema, Decreased strength, Decreased scar mobility, Impaired flexibility, Decreased activity tolerance  Visit Diagnosis: Acute pain of right knee - Plan: PT plan of care cert/re-cert  Localized edema - Plan: PT plan of care cert/re-cert  Other abnormalities of gait and mobility - Plan: PT plan of care cert/re-cert  Muscle weakness (generalized) - Plan: PT plan of care cert/re-cert  Stiffness of right knee, not elsewhere classified - Plan: PT plan of care cert/re-cert     Problem List Patient Active Problem List   Diagnosis Date Noted  . 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 Unilateral primary osteoarthritis, right knee   . Special screening for malignant neoplasms, colon 12/29/2014  . Diarrhea 12/29/2014  . Medication management 05/13/2013  . Does not have health insurance 05/13/2013  . Adjustment reaction with anxiety and depression 03/12/2012  . Medication  monitoring encounter 03/12/2012  . URI, acute 08/13/2011  . Hemorrhoids 08/13/2011  . Perineal itching, female 08/13/2011  . LBBB (left bundle branch block) 09/24/2010  . Murmur 09/08/2010  . EKG, abnormal 08/19/2010  . Vaginitis and vulvovaginitis 08/19/2010  . Visit for preventive health examination 08/19/2010  . Rapid palpitations 08/18/2010  . Dizziness 08/18/2010  . Arthritis 08/18/2010  . ANXIETY 06/18/2008  . GRIEF REACTION 06/18/2008  . DEPRESSION, MAJOR, RECURRENT 07/06/2006  . TOBACCO DEPENDENCE 07/06/2006  . ARTHRALGIA, UNSPECIFIED 07/06/2006     Lorrene Reid, PT 04/05/16 1:17 PM  [facility] [address], [address] [place], [place], [address] Phone: [phone]   Fax:  [phone]  Name: JALANA BOOTHROYD MRN: [id number] Date of Birth: 31-Jul-1955

## 2016-04-05 NOTE — Patient Instructions (Addendum)
Scar Massage  Scar massage is done to improve the mobility of scar, decrease scar tissue from building up, reduce adhesions, and prevent Keloids from forming. Start scar massage after scabs have fallen off by themselves and no open areas. The first few weeks after surgery, it is normal for a scar to appear pink or red and slightly raised. Scars can itch or have areas of numbness. Some scars may be sensitive.   Direct Scar massage: after scar is healed, no opening, no scab 1.  Place pads of two fingers together directly on the scar starting at one end of the scar. Move the fingers up and down across the scar holding 5 seconds one direction.  Then go opposite direction hold 5 seconds.  2. Move over to the next section of the scar and repeat.  Work your way along the entire length of the scar.   3. Next make diagonal movements along the scar holding 5 seconds at one direction. 4. Next movement is side to side. 5. Do not rub fingers over the scar.  Instead keep firm pressure and move scar over the tissue it is on top   Stop the massage and call your doctor if you notice: 1. Increased redness 2. Bleeding from scar 3. Seepage coming from the scar 4. Scar is warmer and has increased pain     Do this 2x/day for ~5 minutes.   [facility] [address] [place], [place] [address] Phone # [phone] Fax [phone]

## 2016-04-07 ENCOUNTER — Ambulatory Visit: Payer: BLUE CROSS/BLUE SHIELD | Admitting: Physical Therapy

## 2016-04-07 ENCOUNTER — Encounter: Payer: Self-pay | Admitting: Physical Therapy

## 2016-04-07 DIAGNOSIS — M25561 Pain in right knee: Secondary | ICD-10-CM

## 2016-04-07 DIAGNOSIS — R2689 Other abnormalities of gait and mobility: Secondary | ICD-10-CM

## 2016-04-07 DIAGNOSIS — R600 Localized edema: Secondary | ICD-10-CM

## 2016-04-07 DIAGNOSIS — M6281 Muscle weakness (generalized): Secondary | ICD-10-CM

## 2016-04-07 NOTE — Therapy (Signed)
[facility] [address] [place], [place], [address] Phone: [phone]   Fax:  [phone]  Physical Therapy Treatment  Patient Details  Name: Valerie Love MRN: [id number] Date of Birth: [birth year] Referring Provider: Aldean Baker, MD  Encounter Date: 04/07/2016      PT End of Session - 04/07/16 1404    Visit Number 2   Date for PT Re-Evaluation 05/31/16   Authorization Type 30 visit limit in 2017(already used 7)   Authorization - Visit Number 2   Authorization - Number of Visits 23   PT Start Time 1400   PT Stop Time 1452   PT Time Calculation (min) 52 min   Equipment Utilized During Treatment Gait belt   Activity Tolerance Patient tolerated treatment well   Behavior During Therapy [facility] for tasks assessed/performed      Past Medical History:  Diagnosis Date  . Anxiety   . Arthritis    R knee, back, hands   . Childhood asthma   . Complication of anesthesia    woke up slowly - 66yrs. ago  . Depression    related to husband illness and death  . History of jaundice    as a child    . Hx of varicella   . Hyperthyroidism    during pregnancy, treated & resolved post partum   . IBS (irritable bowel syndrome)   . LBBB (left bundle branch block)   . Scoliosis   . Thyroid disease in pregnancy     Past Surgical History:  Procedure Laterality Date  . BREAST BIOPSY Left 80  . BREAST BIOPSY Right 1979   benign   . Cyst removed from ovary    . KNEE SURGERY     right duda   . TONSILLECTOMY AND ADENOIDECTOMY  1073  . TOTAL KNEE ARTHROPLASTY 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   Procedure: TOTAL KNEE ARTHROPLASTY;  Surgeon: [name], MD;  Location: [facility] OR;  Service: Orthopedics;  Laterality: Right;  . TUBAL LIGATION    . WISDOM TOOTH EXTRACTION      There were no vitals filed for this visit.      Subjective Assessment - 04/07/16 1403    Subjective Pt presents with some knee pain possibly after walking all the way  around the block yesterday.    Pertinent History Rt TKA: 03/16/16   Limitations Walking;Standing   How long can you walk comfortably? walking limited by fatigue 5-8 minutes   Patient Stated Goals improve gait, reduce knee pain, improve Rt knee A/ROM   Currently in Pain? Yes   Pain Score 2    Pain Location Knee   Pain Orientation Right   Pain Descriptors / Indicators Aching   Pain Type Surgical pain   Pain Onset 1 to 4 weeks ago   Pain Frequency Intermittent   Aggravating Factors  activity, night   Pain Relieving Factors Aleve                         [facility] Adult PT Treatment/Exercise - 04/07/16 0001      Knee/Hip Exercises: Stretches   Passive Hamstring Stretch --     Knee/Hip Exercises: Aerobic   Nustep L1 x 6 minutes  Therapist present to discuss treatment     Knee/Hip Exercises: Supine   Quad Sets Strengthening;Right;1 set;10 reps   Heel Slides AAROM;Right;1 set;20 reps  5 sets with 5 second hold     Modalities   Modalities  Cryotherapy;Vasopneumatic     Vasopneumatic   Number Minutes Vasopneumatic  15 minutes   Vasopnuematic Location  Knee   Vasopneumatic Pressure Medium   Vasopneumatic Temperature  3 snowflakes     Manual Therapy   Manual Therapy Soft tissue mobilization   Manual therapy comments Pt supine   Soft tissue mobilization to distal Rt quad and hamstring to lengthen                  PT Short Term Goals - 04/05/16 1311      PT SHORT TERM GOAL #1   Title be independent in initial HEP   Time 4   Period Weeks   Status New     PT SHORT TERM GOAL #2   Title demonstrate Rt knee A/ROM extension to lacking < or = to 10 degrees to normalize gait   Time 4   Period Weeks   Status New     PT SHORT TERM GOAL #3   Title improve Rt LE strength to walk for 10-12 minutes without need to rest   Time 4   Period Weeks   Status New     PT SHORT TERM GOAL #4   Title ascend step with step-over-step gait with use of 1 rail > 50% of the  time   Time 4   Period Weeks   Status New           PT Long Term Goals - 04/05/16 1217      PT LONG TERM GOAL #1   Title be independent in advanced HEP   Time 8   Period Weeks   Status New     PT LONG TERM GOAL #2   Title reduce FOTO to < or = to 48% limitation   Time 8   Period Weeks   Status New     PT LONG TERM GOAL #3   Title demonstrate Rt knee A/ROM flexion to > or = to 110 degrees to improve squatting and steps   Time 8   Period Weeks   Status New     PT LONG TERM GOAL #4   Title report < or = to 2/10 Rt knee pain with standing and walking   Time 8   Period Weeks   Status New     PT LONG TERM GOAL #5   Title improve Rt LE strength to descend steps with step-over-step gait > or = to 50% of the time   Time 8   Period Weeks   Status New     Additional Long Term Goals   Additional Long Term Goals Yes     PT LONG TERM GOAL #6   Title demonstrate Rt knee A/ROM extension to lacking < or = to 5 degrees to normalize gait pattern   Time 8   Period Weeks   Status New               Plan - 04/07/16 1440    Clinical Impression Statement Pt presents with some Rt knee s/p Rt TKA. Has been doing all exercises given by home health x2 a day. Pt has some swelling in Rt knee. Decreased quad activation and tends to compensate with hip extensors. Pt responded well to manual massage and vasoneumatic. Pt will continue to benefit from skilled therapy for Rt knee strength, stabilization, and ROM.    Rehab Potential Good   PT Frequency 3x / week   PT Duration 8 weeks   PT Treatment/Interventions  ADLs/Self Care Home Management;Cryotherapy;Electrical Stimulation;Iontophoresis 4mg /ml Dexamethasone;Moist Heat;Ultrasound;Gait training;Functional mobility training;Stair training;Patient/family education;Neuromuscular re-education;Therapeutic activities;Therapeutic exercise;Manual techniques;Passive range of motion;Vasopneumatic Device;Taping;Scar mobilization   PT Next Visit  Plan Rt knee A/ROM, strength, mobs, P/ROM, Game Ready   Consulted and Agree with Plan of Care Patient      Patient will benefit from skilled therapeutic intervention in order to improve the following deficits and impairments:  Difficulty walking, Decreased range of motion, Abnormal gait, Decreased endurance, Pain, Increased edema, Decreased strength, Decreased scar mobility, Impaired flexibility, Decreased activity tolerance  Visit Diagnosis: Acute pain of right knee  Localized edema  Other abnormalities of gait and mobility  Muscle weakness (generalized)     Problem List Patient Active Problem List   Diagnosis Date Noted  . 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 Unilateral primary osteoarthritis, right knee   . Special screening for malignant neoplasms, colon 12/29/2014  . Diarrhea 12/29/2014  . Medication management 05/13/2013  . Does not have health insurance 05/13/2013  . Adjustment reaction with anxiety and depression 03/12/2012  . Medication monitoring encounter 03/12/2012  . URI, acute 08/13/2011  . Hemorrhoids 08/13/2011  . Perineal itching, female 08/13/2011  . LBBB (left bundle branch block) 09/24/2010  . Murmur 09/08/2010  . EKG, abnormal 08/19/2010  . Vaginitis and vulvovaginitis 08/19/2010  . Visit for preventive health examination 08/19/2010  . Rapid palpitations 08/18/2010  . Dizziness 08/18/2010  . Arthritis 08/18/2010  . ANXIETY 06/18/2008  . GRIEF REACTION 06/18/2008  . DEPRESSION, MAJOR, RECURRENT 07/06/2006  . TOBACCO DEPENDENCE 07/06/2006  . ARTHRALGIA, UNSPECIFIED 07/06/2006    Dessa PhiKatherine Mahagony Grieb PTA 04/07/2016, 5:08 PM  [facility] [address] [place], [place], [address] Phone: [phone]   Fax:  [phone]  Name: [name] MRN: [id number] Date of Birth: 26-Apr-1956

## 2016-04-08 ENCOUNTER — Ambulatory Visit: Payer: BLUE CROSS/BLUE SHIELD | Attending: Orthopedic Surgery | Admitting: Physical Therapy

## 2016-04-08 ENCOUNTER — Encounter: Payer: Self-pay | Admitting: Physical Therapy

## 2016-04-08 DIAGNOSIS — L03119 Cellulitis of unspecified part of limb: Secondary | ICD-10-CM

## 2016-04-08 DIAGNOSIS — R2689 Other abnormalities of gait and mobility: Secondary | ICD-10-CM | POA: Diagnosis present

## 2016-04-08 DIAGNOSIS — M25561 Pain in right knee: Secondary | ICD-10-CM | POA: Diagnosis present

## 2016-04-08 DIAGNOSIS — M25661 Stiffness of right knee, not elsewhere classified: Secondary | ICD-10-CM | POA: Diagnosis present

## 2016-04-08 DIAGNOSIS — R600 Localized edema: Secondary | ICD-10-CM

## 2016-04-08 DIAGNOSIS — M6281 Muscle weakness (generalized): Secondary | ICD-10-CM | POA: Insufficient documentation

## 2016-04-08 HISTORY — DX: Cellulitis of unspecified part of limb: L03.119

## 2016-04-08 NOTE — Therapy (Signed)
[facility] [address], [address] [place], [place], [address] Phone: [phone]   Fax:  [phone]  Physical Therapy Treatment  Patient Details  Name: Valerie Love MRN: [id number] Date of Birth: 1956/04/30 Referring Provider: Aldean Bakeruda, Marcus, MD  Encounter Date: 04/08/2016      PT End of Session - 04/08/16 0942    Visit Number 3   Date for PT Re-Evaluation 05/31/16   Authorization Type 30 visit limit in 2017(already used 7)   Authorization - Visit Number 3   Authorization - Number of Visits 23   PT Start Time 0847   PT Stop Time 0943   PT Time Calculation (min) 56 min   Activity Tolerance Patient tolerated treatment well   Behavior During Therapy [facility] for tasks assessed/performed      Past Medical History:  Diagnosis Date  . Anxiety   . Arthritis    R knee, back, hands   . Childhood asthma   . Complication of anesthesia    woke up slowly - 6719yrs. ago  . Depression    related to husband illness and death  . History of jaundice    as a child    . Hx of varicella   . Hyperthyroidism    during pregnancy, treated & resolved post partum   . IBS (irritable bowel syndrome)   . LBBB (left bundle branch block)   . Scoliosis   . Thyroid disease in pregnancy     Past Surgical History:  Procedure Laterality Date  . BREAST BIOPSY Left 80  . BREAST BIOPSY Right 1979   benign   . Cyst removed from ovary    . KNEE SURGERY     right duda   . TONSILLECTOMY AND ADENOIDECTOMY  1073  . TOTAL KNEE ARTHROPLASTY 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   Procedure: TOTAL KNEE ARTHROPLASTY;  Surgeon: Nadara MustardMarcus V Duda, MD;  Location: [facility] OR;  Service: Orthopedics;  Laterality: Right;  . TUBAL LIGATION    . WISDOM TOOTH EXTRACTION      There were no vitals filed for this visit.      Subjective Assessment - 04/08/16 0854    Subjective Pt reports knee feeling better today than yesterday. Feeling like shes getting stonger.    Pertinent History Rt TKA:  03/16/16   Limitations Walking;Standing   How long can you walk comfortably? walking limited by fatigue 5-8 minutes   Patient Stated Goals improve gait, reduce knee pain, improve Rt knee A/ROM   Currently in Pain? Yes   Pain Score 2    Pain Location Knee   Pain Orientation Right   Pain Descriptors / Indicators Aching   Pain Type Surgical pain   Pain Onset 1 to 4 weeks ago   Pain Frequency Intermittent   Aggravating Factors  activity, night   Pain Relieving Factors Aleve            [facility] PT Assessment - 04/08/16 0001      PROM   Right Knee Extension 13   Right Knee Flexion 90                     [facility] Adult PT Treatment/Exercise - 04/08/16 0001      Knee/Hip Exercises: Stretches   Passive Hamstring Stretch 30 seconds;2 reps     Knee/Hip Exercises: Aerobic   Stationary Bike Level 0 for ROM: x 8 minutes  Therapist present to discuss treatment     Knee/Hip Exercises: Supine   Quad  Sets Strengthening;Right;1 set;10 reps   Heel Slides AAROM;Right;1 set;20 reps  5 sets with 5 second hold   Bridges with [employer] Strengthening;Both;2 sets;10 reps  Ball squeeze only 3 second holds   Straight Leg Raises Strengthening;Right;2 sets;10 reps     Modalities   Modalities Cryotherapy;Vasopneumatic     Vasopneumatic   Number Minutes Vasopneumatic  15 minutes   Vasopnuematic Location  Knee   Vasopneumatic Pressure Medium   Vasopneumatic Temperature  3 snowflakes     Manual Therapy   Manual Therapy Soft tissue mobilization   Manual therapy comments Pt supine   Soft tissue mobilization to distal Rt quad and hamstring to lengthen                  PT Short Term Goals - 04/05/16 1311      PT SHORT TERM GOAL #1   Title be independent in initial HEP   Time 4   Period Weeks   Status New     PT SHORT TERM GOAL #2   Title demonstrate Rt knee A/ROM extension to lacking < or = to 10 degrees to normalize gait   Time 4   Period Weeks   Status New      PT SHORT TERM GOAL #3   Title improve Rt LE strength to walk for 10-12 minutes without need to rest   Time 4   Period Weeks   Status New     PT SHORT TERM GOAL #4   Title ascend step with step-over-step gait with use of 1 rail > 50% of the time   Time 4   Period Weeks   Status New           PT Long Term Goals - 04/05/16 1217      PT LONG TERM GOAL #1   Title be independent in advanced HEP   Time 8   Period Weeks   Status New     PT LONG TERM GOAL #2   Title reduce FOTO to < or = to 48% limitation   Time 8   Period Weeks   Status New     PT LONG TERM GOAL #3   Title demonstrate Rt knee A/ROM flexion to > or = to 110 degrees to improve squatting and steps   Time 8   Period Weeks   Status New     PT LONG TERM GOAL #4   Title report < or = to 2/10 Rt knee pain with standing and walking   Time 8   Period Weeks   Status New     PT LONG TERM GOAL #5   Title improve Rt LE strength to descend steps with step-over-step gait > or = to 50% of the time   Time 8   Period Weeks   Status New     Additional Long Term Goals   Additional Long Term Goals Yes     PT LONG TERM GOAL #6   Title demonstrate Rt knee A/ROM extension to lacking < or = to 5 degrees to normalize gait pattern   Time 8   Period Weeks   Status New               Plan - 04/08/16 8413    Clinical Impression Statement Pt able to acheive good quad set today. Knee extension -13 and flexion 90 measured in supine. Pt reports being able to feel the back of knee for the first time since surgery. Pt  will continue to benefit from skilled therapy for Knee strength and stability.    Rehab Potential Good   PT Frequency 3x / week   PT Duration 8 weeks   PT Treatment/Interventions ADLs/Self Care Home Management;Cryotherapy;Electrical Stimulation;Iontophoresis 4mg /ml Dexamethasone;Moist Heat;Ultrasound;Gait training;Functional mobility training;Stair training;Patient/family education;Neuromuscular  re-education;Therapeutic activities;Therapeutic exercise;Manual techniques;Passive range of motion;Vasopneumatic Device;Taping;Scar mobilization   PT Next Visit Plan Rt knee A/ROM, strength, mobs, P/ROM, Game Ready   Consulted and Agree with Plan of Care Patient      Patient will benefit from skilled therapeutic intervention in order to improve the following deficits and impairments:  Difficulty walking, Decreased range of motion, Abnormal gait, Decreased endurance, Pain, Increased edema, Decreased strength, Decreased scar mobility, Impaired flexibility, Decreased activity tolerance  Visit Diagnosis: Acute pain of right knee  Localized edema  Other abnormalities of gait and mobility  Muscle weakness (generalized)  Stiffness of right knee, not elsewhere classified     Problem List Patient Active Problem List   Diagnosis Date Noted  . 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 Unilateral primary osteoarthritis, right knee   . Special screening for malignant neoplasms, colon 12/29/2014  . Diarrhea 12/29/2014  . Medication management 05/13/2013  . Does not have health insurance 05/13/2013  . Adjustment reaction with anxiety and depression 03/12/2012  . Medication monitoring encounter 03/12/2012  . URI, acute 08/13/2011  . Hemorrhoids 08/13/2011  . Perineal itching, female 08/13/2011  . LBBB (left bundle branch block) 09/24/2010  . Murmur 09/08/2010  . EKG, abnormal 08/19/2010  . Vaginitis and vulvovaginitis 08/19/2010  . Visit for preventive health examination 08/19/2010  . Rapid palpitations 08/18/2010  . Dizziness 08/18/2010  . Arthritis 08/18/2010  . ANXIETY 06/18/2008  . GRIEF REACTION 06/18/2008  . DEPRESSION, MAJOR, RECURRENT 07/06/2006  . TOBACCO DEPENDENCE 07/06/2006  . ARTHRALGIA, UNSPECIFIED 07/06/2006    Dessa Phi PTA 04/08/2016, 9:46 AM   [facility] [address], [address] [place], [place], [address] Phone: [phone]   Fax:  [phone]  Name: Valerie Love MRN: [id number] Date of Birth: 08/23/1955

## 2016-04-11 ENCOUNTER — Ambulatory Visit: Payer: BLUE CROSS/BLUE SHIELD

## 2016-04-11 DIAGNOSIS — M6281 Muscle weakness (generalized): Secondary | ICD-10-CM

## 2016-04-11 DIAGNOSIS — M25561 Pain in right knee: Secondary | ICD-10-CM | POA: Diagnosis not present

## 2016-04-11 DIAGNOSIS — R600 Localized edema: Secondary | ICD-10-CM

## 2016-04-11 DIAGNOSIS — M25661 Stiffness of right knee, not elsewhere classified: Secondary | ICD-10-CM

## 2016-04-11 DIAGNOSIS — R2689 Other abnormalities of gait and mobility: Secondary | ICD-10-CM

## 2016-04-11 NOTE — Therapy (Signed)
[facility] [address], [address] [place], [place], [address] Phone: [phone]   Fax:  [phone]  Physical Therapy Treatment  Patient Details  Name: Valerie Love MRN: [id number] Date of Birth: 05/12/1955 Referring Provider: Aldean Baker, MD  Encounter Date: 04/11/2016      PT End of Session - 04/11/16 1007    Visit Number 4   Date for PT Re-Evaluation 05/31/16   Authorization Type 30 visit limit in 2017(already used 7)   Authorization - Visit Number 4   Authorization - Number of Visits 23   PT Start Time [phone]   PT Stop Time 1022   PT Time Calculation (min) 59 min   Activity Tolerance Patient tolerated treatment well   Behavior During Therapy [facility] for tasks assessed/performed      Past Medical History:  Diagnosis Date  . Anxiety   . Arthritis    R knee, back, hands   . Childhood asthma   . Complication of anesthesia    woke up slowly - 23yrs. ago  . Depression    related to husband illness and death  . History of jaundice    as a child    . Hx of varicella   . Hyperthyroidism    during pregnancy, treated & resolved post partum   . IBS (irritable bowel syndrome)   . LBBB (left bundle branch block)   . Scoliosis   . Thyroid disease in pregnancy     Past Surgical History:  Procedure Laterality Date  . BREAST BIOPSY Left 80  . BREAST BIOPSY Right 1979   benign   . Cyst removed from ovary    . KNEE SURGERY     right duda   . TONSILLECTOMY AND ADENOIDECTOMY  1073  . TOTAL KNEE ARTHROPLASTY 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   Procedure: TOTAL KNEE ARTHROPLASTY;  Surgeon: [name], MD;  Location: [facility] OR;  Service: Orthopedics;  Laterality: Right;  . TUBAL LIGATION    . WISDOM TOOTH EXTRACTION      There were no vitals filed for this visit.      Subjective Assessment - 04/11/16 0924    Subjective More swelling after being up on knee over the the weekend.     Pertinent History Rt TKA: 03/16/16   Patient Stated  Goals improve gait, reduce knee pain, improve Rt knee A/ROM   Currently in Pain? Yes   Pain Score 3    Pain Location Knee   Pain Orientation Right   Pain Descriptors / Indicators Aching   Pain Type Surgical pain   Pain Onset 1 to 4 weeks ago   Pain Frequency Intermittent   Aggravating Factors  standing/walking too much, night pain   Pain Relieving Factors Aleve, ice                         [facility] Adult PT Treatment/Exercise - 04/11/16 0001      Knee/Hip Exercises: Stretches   Active Hamstring Stretch 3 reps;20 seconds;Right     Knee/Hip Exercises: Aerobic   Stationary Bike Level 0 for ROM: x 8 minutes  Therapist present to discuss treatment   Nustep L1 x 8 minutes  Therapist present to discuss treatment     Knee/Hip Exercises: Standing   Rocker Board 3 minutes   Rebounder weight shifting 3 ways x 1 min each     Knee/Hip Exercises: Supine   Heel Slides AAROM;Right;1 set;20 reps  5 sets with 5  second hold   Bridges with [employer] Strengthening;Both;2 sets;10 reps  Ball squeeze only 3 second holds     Modalities   Modalities Cryotherapy     Vasopneumatic   Number Minutes Vasopneumatic  15 minutes   Vasopnuematic Location  Knee   Vasopneumatic Pressure Medium   Vasopneumatic Temperature  3 snowflakes     Manual Therapy   Manual Therapy Soft tissue mobilization   Manual therapy comments Pt supine   Soft tissue mobilization to distal Rt quad and hamstring to lengthen, scar massage/mobilization  redness at distal incsion today- pt advised to watch this                  PT Short Term Goals - 04/11/16 0924      PT SHORT TERM GOAL #1   Title be independent in initial HEP   Time 4   Period Weeks   Status On-going     PT SHORT TERM GOAL #2   Title demonstrate Rt knee A/ROM extension to lacking < or = to 10 degrees to normalize gait   Time 4   Period Weeks   Status On-going     PT SHORT TERM GOAL #3   Title improve Rt LE strength to  walk for 10-12 minutes without need to rest   Time 4   Period Weeks   Status On-going     PT SHORT TERM GOAL #4   Title ascend step with step-over-step gait with use of 1 rail > 50% of the time   Time 4   Period Weeks   Status On-going           PT Long Term Goals - 04/05/16 1217      PT LONG TERM GOAL #1   Title be independent in advanced HEP   Time 8   Period Weeks   Status New     PT LONG TERM GOAL #2   Title reduce FOTO to < or = to 48% limitation   Time 8   Period Weeks   Status New     PT LONG TERM GOAL #3   Title demonstrate Rt knee A/ROM flexion to > or = to 110 degrees to improve squatting and steps   Time 8   Period Weeks   Status New     PT LONG TERM GOAL #4   Title report < or = to 2/10 Rt knee pain with standing and walking   Time 8   Period Weeks   Status New     PT LONG TERM GOAL #5   Title improve Rt LE strength to descend steps with step-over-step gait > or = to 50% of the time   Time 8   Period Weeks   Status New     Additional Long Term Goals   Additional Long Term Goals Yes     PT LONG TERM GOAL #6   Title demonstrate Rt knee A/ROM extension to lacking < or = to 5 degrees to normalize gait pattern   Time 8   Period Weeks   Status New               Plan - 04/11/16 1610    Clinical Impression Statement Pt is independent in all HEP for Rt knee AROM and strength.  Pt with reduced A/ROM s/p knee replacement.  Pt with improved distal quad and hamstring mobilty.  Pt with continued edema, limited A/ROM and gait abnormality s/p TKA and will continue to  benefit from skilled PT for strength, endurance, ROM and edema management.     Rehab Potential Good   PT Frequency 3x / week   PT Duration 8 weeks   PT Treatment/Interventions ADLs/Self Care Home Management;Cryotherapy;Electrical Stimulation;Iontophoresis 4mg /ml Dexamethasone;Moist Heat;Ultrasound;Gait training;Functional mobility training;Stair training;Patient/family  education;Neuromuscular re-education;Therapeutic activities;Therapeutic exercise;Manual techniques;Passive range of motion;Vasopneumatic Device;Taping;Scar mobilization   PT Next Visit Plan Rt knee A/ROM, strength, mobs, P/ROM, Game Ready   Consulted and Agree with Plan of Care Patient      Patient will benefit from skilled therapeutic intervention in order to improve the following deficits and impairments:  Difficulty walking, Decreased range of motion, Abnormal gait, Decreased endurance, Pain, Increased edema, Decreased strength, Decreased scar mobility, Impaired flexibility, Decreased activity tolerance  Visit Diagnosis: Acute pain of right knee  Localized edema  Other abnormalities of gait and mobility  Muscle weakness (generalized)  Stiffness of right knee, not elsewhere classified     Problem List Patient Active Problem List   Diagnosis Date Noted  . 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 Unilateral primary osteoarthritis, right knee   . Special screening for malignant neoplasms, colon 12/29/2014  . Diarrhea 12/29/2014  . Medication management 05/13/2013  . Does not have health insurance 05/13/2013  . Adjustment reaction with anxiety and depression 03/12/2012  . Medication monitoring encounter 03/12/2012  . URI, acute 08/13/2011  . Hemorrhoids 08/13/2011  . Perineal itching, female 08/13/2011  . LBBB (left bundle branch block) 09/24/2010  . Murmur 09/08/2010  . EKG, abnormal 08/19/2010  . Vaginitis and vulvovaginitis 08/19/2010  . Visit for preventive health examination 08/19/2010  . Rapid palpitations 08/18/2010  . Dizziness 08/18/2010  . Arthritis 08/18/2010  . ANXIETY 06/18/2008  . GRIEF REACTION 06/18/2008  . DEPRESSION, MAJOR, RECURRENT 07/06/2006  . TOBACCO DEPENDENCE 07/06/2006  . ARTHRALGIA, UNSPECIFIED 07/06/2006     Lorrene ReidKelly Amee Boothe, PT 04/11/16 10:08 AM  [facility] [address],  [address] [place], [place], [address] Phone: [phone]   Fax:  [phone]  Name: Valerie Love MRN: [id number] Date of Birth: 15-Sep-1955

## 2016-04-13 ENCOUNTER — Encounter: Payer: Self-pay | Admitting: Physical Therapy

## 2016-04-13 ENCOUNTER — Ambulatory Visit: Payer: BLUE CROSS/BLUE SHIELD | Admitting: Physical Therapy

## 2016-04-13 DIAGNOSIS — M6281 Muscle weakness (generalized): Secondary | ICD-10-CM

## 2016-04-13 DIAGNOSIS — M25561 Pain in right knee: Secondary | ICD-10-CM

## 2016-04-13 DIAGNOSIS — R600 Localized edema: Secondary | ICD-10-CM

## 2016-04-13 DIAGNOSIS — M25661 Stiffness of right knee, not elsewhere classified: Secondary | ICD-10-CM

## 2016-04-13 DIAGNOSIS — R2689 Other abnormalities of gait and mobility: Secondary | ICD-10-CM

## 2016-04-13 NOTE — Therapy (Signed)
[facility] [address] [place], [place], [address] Phone: [phone]   Fax:  [phone]  Physical Therapy Treatment  Patient Details  Name: Valerie Love MRN: [id number] Date of Birth: Nov 21, 1955 Referring Provider: Aldean Baker, MD  Encounter Date: 04/13/2016      PT End of Session - 04/13/16 1244    Visit Number 5   Date for PT Re-Evaluation 05/31/16   Authorization Type 30 visit limit in 2017(already used 7)   Authorization - Visit Number 5   Authorization - Number of Visits 23   PT Start Time 1233   PT Stop Time 1330   PT Time Calculation (min) 57 min   Equipment Utilized During Treatment Gait belt   Activity Tolerance Patient tolerated treatment well   Behavior During Therapy [facility] for tasks assessed/performed      Past Medical History:  Diagnosis Date  . Anxiety   . Arthritis    R knee, back, hands   . Childhood asthma   . Complication of anesthesia    woke up slowly - 78yrs. ago  . Depression    related to husband illness and death  . History of jaundice    as a child    . Hx of varicella   . Hyperthyroidism    during pregnancy, treated & resolved post partum   . IBS (irritable bowel syndrome)   . LBBB (left bundle branch block)   . Scoliosis   . Thyroid disease in pregnancy     Past Surgical History:  Procedure Laterality Date  . BREAST BIOPSY Left 80  . BREAST BIOPSY Right 1979   benign   . Cyst removed from ovary    . KNEE SURGERY     right duda   . TONSILLECTOMY AND ADENOIDECTOMY  1073  . TOTAL KNEE ARTHROPLASTY 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   Procedure: TOTAL KNEE ARTHROPLASTY;  Surgeon: [name], MD;  Location: [facility] OR;  Service: Orthopedics;  Laterality: Right;  . TUBAL LIGATION    . WISDOM TOOTH EXTRACTION      There were no vitals filed for this visit.      Subjective Assessment - 04/13/16 1242    Subjective Pt reports knee feeling humbly today. Having some minior concerns  about incision.    Pertinent History Rt TKA: 03/16/16   Limitations Walking;Standing   How long can you walk comfortably? walking limited by fatigue 5-8 minutes   Patient Stated Goals improve gait, reduce knee pain, improve Rt knee A/ROM   Currently in Pain? Yes   Pain Score 3    Pain Location Knee   Pain Orientation Right   Pain Descriptors / Indicators Aching   Pain Type Surgical pain   Pain Onset 1 to 4 weeks ago   Pain Frequency Intermittent                         [facility] Adult PT Treatment/Exercise - 04/13/16 0001      Knee/Hip Exercises: Stretches   Active Hamstring Stretch 3 reps;20 seconds;Right  Ankle porpped on foam roll x2 minutes   Quad Stretch Right;5 reps  Supine with green strap   Gastroc Stretch Right;2 reps;10 seconds     Knee/Hip Exercises: Aerobic   Stationary Bike Level 0 for ROM: x 8 minutes  Therapist present to discuss treatment     Knee/Hip Exercises: Standing   Forward Step Up --  Practiced safely navigating stairs at home  Forward Step Up Limitations Rt foot 4" box   Rebounder weight shifting 3 ways x 1 min each     Knee/Hip Exercises: Supine   Heel Slides Right;1 set;20 reps;AROM  5 sets with 5 second hold   Straight Leg Raises Strengthening;Right;2 sets;10 reps     Modalities   Modalities Cryotherapy     Vasopneumatic   Number Minutes Vasopneumatic  15 minutes   Vasopnuematic Location  Knee   Vasopneumatic Pressure Medium   Vasopneumatic Temperature  3 snowflakes                  PT Short Term Goals - 04/13/16 1247      PT SHORT TERM GOAL #1   Title be independent in initial HEP   Time 4   Period Weeks   Status Achieved     PT SHORT TERM GOAL #2   Title demonstrate Rt knee A/ROM extension to lacking < or = to 10 degrees to normalize gait   Time 4   Period Weeks   Status On-going     PT SHORT TERM GOAL #3   Title improve Rt LE strength to walk for 10-12 minutes without need to rest   Time 4    Period Weeks   Status On-going     PT SHORT TERM GOAL #4   Title ascend step with step-over-step gait with use of 1 rail > 50% of the time   Time 4   Period Weeks   Status On-going           PT Long Term Goals - 04/13/16 1247      PT LONG TERM GOAL #1   Title be independent in advanced HEP   Time 8   Period Weeks   Status On-going     PT LONG TERM GOAL #2   Title reduce FOTO to < or = to 48% limitation   Time 8   Period Weeks   Status On-going     PT LONG TERM GOAL #3   Title demonstrate Rt knee A/ROM flexion to > or = to 110 degrees to improve squatting and steps   Time 8   Period Weeks   Status On-going     PT LONG TERM GOAL #4   Title report < or = to 2/10 Rt knee pain with standing and walking   Time 8   Period Weeks   Status On-going     PT LONG TERM GOAL #5   Title improve Rt LE strength to descend steps with step-over-step gait > or = to 50% of the time   Time 8   Period Weeks   Status On-going     PT LONG TERM GOAL #6   Title demonstrate Rt knee A/ROM extension to lacking < or = to 5 degrees to normalize gait pattern   Time 8   Period Weeks   Status On-going               Plan - 04/13/16 1311    Clinical Impression Statement Pt reports knee feeling tight but doing well. Able to tolerate all exercises well. Practiced safe stair climbing and did well. Pt able to walk up and down steps with step oer pattern, some pain with descending. Pt will continue to progress with skilled therapy for knee stability and ROM.    Rehab Potential Good   PT Frequency 3x / week   PT Duration 8 weeks   PT Treatment/Interventions ADLs/Self Care Home  Management;Cryotherapy;Electrical Stimulation;Iontophoresis 4mg /ml Dexamethasone;Moist Heat;Ultrasound;Gait training;Functional mobility training;Stair training;Patient/family education;Neuromuscular re-education;Therapeutic activities;Therapeutic exercise;Manual techniques;Passive range of motion;Vasopneumatic  Device;Taping;Scar mobilization   PT Next Visit Plan Measure knee ROM, Rt knee A/ROM, strength, mobs, P/ROM, Game Ready   Consulted and Agree with Plan of Care Patient      Patient will benefit from skilled therapeutic intervention in order to improve the following deficits and impairments:  Difficulty walking, Decreased range of motion, Abnormal gait, Decreased endurance, Pain, Increased edema, Decreased strength, Decreased scar mobility, Impaired flexibility, Decreased activity tolerance  Visit Diagnosis: Acute pain of right knee  Localized edema  Other abnormalities of gait and mobility  Muscle weakness (generalized)  Stiffness of right knee, not elsewhere classified     Problem List Patient Active Problem List   Diagnosis Date Noted  . 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 Unilateral primary osteoarthritis, right knee   . Special screening for malignant neoplasms, colon 12/29/2014  . Diarrhea 12/29/2014  . Medication management 05/13/2013  . Does not have health insurance 05/13/2013  . Adjustment reaction with anxiety and depression 03/12/2012  . Medication monitoring encounter 03/12/2012  . URI, acute 08/13/2011  . Hemorrhoids 08/13/2011  . Perineal itching, female 08/13/2011  . LBBB (left bundle branch block) 09/24/2010  . Murmur 09/08/2010  . EKG, abnormal 08/19/2010  . Vaginitis and vulvovaginitis 08/19/2010  . Visit for preventive health examination 08/19/2010  . Rapid palpitations 08/18/2010  . Dizziness 08/18/2010  . Arthritis 08/18/2010  . ANXIETY 06/18/2008  . GRIEF REACTION 06/18/2008  . DEPRESSION, MAJOR, RECURRENT 07/06/2006  . TOBACCO DEPENDENCE 07/06/2006  . ARTHRALGIA, UNSPECIFIED 07/06/2006    Dessa PhiKatherine Matthews PTA 04/13/2016, 1:31 PM   [facility] [address] [place], [place], [address] Phone: [phone]   Fax:  [phone]  Name: Valerie Love MRN:  [id number] Date of Birth: 18-May-1955

## 2016-04-14 ENCOUNTER — Ambulatory Visit (INDEPENDENT_AMBULATORY_CARE_PROVIDER_SITE_OTHER): Payer: BLUE CROSS/BLUE SHIELD | Admitting: Orthopedic Surgery

## 2016-04-14 VITALS — Ht 61.0 in | Wt 158.0 lb

## 2016-04-14 DIAGNOSIS — Z96651 Presence of right artificial knee joint: Secondary | ICD-10-CM

## 2016-04-14 MED ORDER — MUPIROCIN 2 % EX OINT: 1.0000 "application " | TOPICAL_OINTMENT | Freq: Two times a day (BID) | CUTANEOUS | 3 refills | Status: DC

## 2016-04-14 MED ORDER — DOXYCYCLINE HYCLATE 100 MG PO TABS: 100.0000 mg | ORAL_TABLET | Freq: Two times a day (BID) | ORAL | 0 refills | Status: DC

## 2016-04-14 NOTE — Progress Notes (Signed)
Office Visit Note   Patient: Valerie Love           Date of Birth: 1955/05/29           MRN: [id number] Visit Date: 04/14/2016              Requested by: Madelin HeadingsWanda K Panosh, MD [address] [place], [place] [id number] PCP: Lorretta HarpPANOSH,WANDA KOTVAN, MD   Assessment & Plan: Visit Diagnoses:  1. S/P total knee arthroplasty, right     Plan: Patient does have some cellulitis inferiorly over the surgical incision most likely due to the retained Vicryl suture. I can palpate this in the wound and there is a very small drop of drainage. We will start her on doxycycline Bactroban dressing changes 3 times a day follow-up on Monday. If patient is still symptomatic we would need to proceed with local anesthesia in the office and removal of retained Vicryl suture knot. I attempted to remove it in the office today and patient was symptomatic with attempted removal.   Patient will call if she develops any fever or chills or any acute pain in her right knee.  Follow-Up Instructions: Return in about 1 week (around 04/21/2016).   Orders:  No orders of the defined types were placed in this encounter.  Meds ordered this encounter  Medications  . doxycycline (VIBRA-TABS) 100 MG tablet    Sig: Take 1 tablet (100 mg total) by mouth 2 (two) times daily.    Dispense:  60 tablet    Refill:  0  . mupirocin ointment (BACTROBAN) 2 %    Sig: Apply 1 application topically 2 (two) times daily. Apply to the affected area 2 times a day    Dispense:  22 g    Refill:  3      Procedures: No procedures performed   Clinical Data: No additional findings.   Subjective: Chief Complaint  Patient presents with  . Right Knee - Follow-up    03/16/16 right total knee arthroplasty    Patient is one month sp a right total knee. She ambulates with a cane and participates in out patient physical therapy several times a week. Patient is concerned that she has an infection because the distal end of her incision feels  warm to the touch and is slightly red looking. The pt tries while in the office to manually express "pus" from the incision and states that her physical therapist thinks that there is a a retained  Internal stitch that " should have disolved." and she wants this investigated today.  Patient nasal swab was positive for staph. Patient states that she has stopped smoking years ago.  Review of Systems   Objective: Vital Signs: Ht 5\' 1"  (1.549 m)   Wt 158 lb (71.7 kg)   BMI 29.85 kg/m   Physical Exam on examination patient has an area 2 cm of cellulitis over the inferior aspect the incision there is no pain with range of motion of her knee there is no knee effusion. No signs or symptoms of a septic joint she is afebrile. I attempted to remove the suture knot from the area of the sterile abscess of this was painful for the patient and we stop trying to remove the suture. A Bactroban dressing was applied. We will start on oral doxycycline and topical Bactroban with follow-up in the office on Monday. May need to remove the suture knot in the office.   Ortho Exam  Specialty Comments:  No specialty  comments available.  Imaging: No results found.   PMFS History: Patient Active Problem List   Diagnosis Date Noted  . 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 Unilateral primary osteoarthritis, right knee   . Special screening for malignant neoplasms, colon 12/29/2014  . Diarrhea 12/29/2014  . Medication management 05/13/2013  . Does not have health insurance 05/13/2013  . Adjustment reaction with anxiety and depression 03/12/2012  . Medication monitoring encounter 03/12/2012  . URI, acute 08/13/2011  . Hemorrhoids 08/13/2011  . Perineal itching, female 08/13/2011  . LBBB (left bundle branch block) 09/24/2010  . Murmur 09/08/2010  . EKG, abnormal 08/19/2010  . Vaginitis and vulvovaginitis 08/19/2010  . Visit for preventive health examination 08/19/2010  . Rapid palpitations  08/18/2010  . Dizziness 08/18/2010  . Arthritis 08/18/2010  . ANXIETY 06/18/2008  . GRIEF REACTION 06/18/2008  . DEPRESSION, MAJOR, RECURRENT 07/06/2006  . TOBACCO DEPENDENCE 07/06/2006  . ARTHRALGIA, UNSPECIFIED 07/06/2006   Past Medical History:  Diagnosis Date  . Anxiety   . Arthritis    R knee, back, hands   . Childhood asthma   . Complication of anesthesia    woke up slowly - 3yrs. ago  . Depression    related to husband illness and death  . History of jaundice    as a child    . Hx of varicella   . Hyperthyroidism    during pregnancy, treated & resolved post partum   . IBS (irritable bowel syndrome)   . LBBB (left bundle branch block)   . Scoliosis   . Thyroid disease in pregnancy     Family History  Problem Relation Age of Onset  . Alcohol abuse Mother     died from stomach ulcers  . Stroke Father     died  from cva and mi  . Hypertension Father   . Hyperlipidemia Father   . Coronary artery disease Father     Age 42  . Colon polyps Sister   . Hepatitis C Brother   . Colon cancer Neg Hx     Past Surgical History:  Procedure Laterality Date  . BREAST BIOPSY Left 80  . BREAST BIOPSY Right 1979   benign   . Cyst removed from ovary    . KNEE SURGERY     right B[facility]d[place]   . TONSILLECTOMY AND ADENOIDECTOMY  1073  . TOTAL KNEE ARTHROPLASTY 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   Procedure: TOTAL KNEE ARTHROPLASTY;  Surgeon: [name], MD;  Location: [facility] OR;  Service: Orthopedics;  Laterality: Right;  . TUBAL LIGATION    . WISDOM TOOTH EXTRACTION     Social History   Occupational History  . self     Catering and cleaning   Social History Main Topics  . Smoking status: Former Smoker    Packs/day: 0.25    Years: 40.00    Types: Cigarettes    Quit date: 01/06/2016  . Smokeless tobacco: Never Used     Comment: none in about 1 week  . Alcohol use 0.0 oz/week     Comment: socially-wine- daily  . Drug use: No  . Sexual activity: Not on file

## 2016-04-15 ENCOUNTER — Encounter: Payer: Self-pay | Admitting: Physical Therapy

## 2016-04-15 ENCOUNTER — Ambulatory Visit: Payer: BLUE CROSS/BLUE SHIELD | Admitting: Physical Therapy

## 2016-04-15 DIAGNOSIS — R2689 Other abnormalities of gait and mobility: Secondary | ICD-10-CM

## 2016-04-15 DIAGNOSIS — R600 Localized edema: Secondary | ICD-10-CM

## 2016-04-15 DIAGNOSIS — M25561 Pain in right knee: Secondary | ICD-10-CM

## 2016-04-15 NOTE — Therapy (Signed)
[facility] [address] [place], [place], [address] Phone: [phone]   Fax:  [phone]  Physical Therapy Treatment  Patient Details  Name: Valerie Love MRN: [id number] Date of Birth: 01-Feb-1956 Referring Provider: Aldean Baker, MD  Encounter Date: 04/15/2016      PT End of Session - 04/15/16 0932    Visit Number 6   Date for PT Re-Evaluation 05/31/16   Authorization Type 6   Authorization - Visit Number 6   Authorization - Number of Visits 23   PT Start Time 0928   PT Stop Time 1035   PT Time Calculation (min) 67 min   Activity Tolerance Patient tolerated treatment well   Behavior During Therapy [facility] for tasks assessed/performed      Past Medical History:  Diagnosis Date  . Anxiety   . Arthritis    R knee, back, hands   . Childhood asthma   . Complication of anesthesia    woke up slowly - 29yrs. ago  . Depression    related to husband illness and death  . History of jaundice    as a child    . Hx of varicella   . Hyperthyroidism    during pregnancy, treated & resolved post partum   . IBS (irritable bowel syndrome)   . LBBB (left bundle branch block)   . Scoliosis   . Thyroid disease in pregnancy     Past Surgical History:  Procedure Laterality Date  . BREAST BIOPSY Left 80  . BREAST BIOPSY Right 1979   benign   . Cyst removed from ovary    . KNEE SURGERY     right duda   . TONSILLECTOMY AND ADENOIDECTOMY  1073  . TOTAL KNEE ARTHROPLASTY 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   Procedure: TOTAL KNEE ARTHROPLASTY;  Surgeon: [name], MD;  Location: [facility];  Service: Orthopedics;  Laterality: Right;  . TUBAL LIGATION    . WISDOM TOOTH EXTRACTION      There were no vitals filed for this visit.      Subjective Assessment - 04/15/16 0929    Subjective Pt saw MD yesterday reguarding incision. Pt has infection for which she is now taking antibiotics and is going back to MD on Monday.    Pertinent History  Rt TKA: 03/16/16   Limitations Walking;Standing   How long can you walk comfortably? walking limited by fatigue 5-8 minutes   Patient Stated Goals improve gait, reduce knee pain, improve Rt knee A/ROM   Currently in Pain? Yes   Pain Score 5    Pain Location Knee   Pain Orientation Right   Pain Descriptors / Indicators Aching   Pain Type Surgical pain   Pain Onset 1 to 4 weeks ago   Pain Frequency Intermittent            [facility] PT Assessment - 04/15/16 0001      PROM   Right Knee Flexion 96                     [facility] Adult PT Treatment/Exercise - 04/15/16 0001      Knee/Hip Exercises: Stretches   Active Hamstring Stretch 3 reps;20 seconds;Right  Ankle porpped on foam roll x2 minutes   Quad Stretch Right;5 reps  Supine with green strap   Gastroc Stretch Right;2 reps;10 seconds     Knee/Hip Exercises: Aerobic   Stationary Bike Level 0 for ROM: x 8 minutes  Therapist present to  discuss treatment     Knee/Hip Exercises: Standing   Forward Step Up 20 reps;Hand Hold: 2;Step Height: 6"   Rebounder weight shifting 3 ways x 1 min each     Knee/Hip Exercises: Supine   Short Arc Quad Sets Strengthening;Right;20 reps   Heel Slides Right;1 set;20 reps;AROM  5 sets with 5 second hold   Straight Leg Raises Strengthening;Right;2 sets;10 reps     Modalities   Modalities Cryotherapy     Vasopneumatic   Number Minutes Vasopneumatic  15 minutes   Vasopnuematic Location  Knee   Vasopneumatic Pressure Medium   Vasopneumatic Temperature  3 snowflakes     Manual Therapy   Manual Therapy Soft tissue mobilization   Manual therapy comments Pt supine   Soft tissue mobilization To distal quads, hmastring  Scar mobz to upper incision only due to infection                  PT Short Term Goals - 04/13/16 1247      PT SHORT TERM GOAL #1   Title be independent in initial HEP   Time 4   Period Weeks   Status Achieved     PT SHORT TERM GOAL #2   Title  demonstrate Rt knee A/ROM extension to lacking < or = to 10 degrees to normalize gait   Time 4   Period Weeks   Status On-going     PT SHORT TERM GOAL #3   Title improve Rt LE strength to walk for 10-12 minutes without need to rest   Time 4   Period Weeks   Status On-going     PT SHORT TERM GOAL #4   Title ascend step with step-over-step gait with use of 1 rail > 50% of the time   Time 4   Period Weeks   Status On-going           PT Long Term Goals - 04/13/16 1247      PT LONG TERM GOAL #1   Title be independent in advanced HEP   Time 8   Period Weeks   Status On-going     PT LONG TERM GOAL #2   Title reduce FOTO to < or = to 48% limitation   Time 8   Period Weeks   Status On-going     PT LONG TERM GOAL #3   Title demonstrate Rt knee A/ROM flexion to > or = to 110 degrees to improve squatting and steps   Time 8   Period Weeks   Status On-going     PT LONG TERM GOAL #4   Title report < or = to 2/10 Rt knee pain with standing and walking   Time 8   Period Weeks   Status On-going     PT LONG TERM GOAL #5   Title improve Rt LE strength to descend steps with step-over-step gait > or = to 50% of the time   Time 8   Period Weeks   Status On-going     PT LONG TERM GOAL #6   Title demonstrate Rt knee A/ROM extension to lacking < or = to 5 degrees to normalize gait pattern   Time 8   Period Weeks   Status On-going               Plan - 04/15/16 0944    Clinical Impression Statement Pt has infection at distal end of incison and is currently on antibiotics and being monitored by  MD. Pt able to tolerate all exercises well today. Will continue to progress with more standing exercises and knee ROM.    Rehab Potential Good   PT Frequency 3x / week   PT Duration 8 weeks   PT Treatment/Interventions ADLs/Self Care Home Management;Cryotherapy;Electrical Stimulation;Iontophoresis 4mg /ml Dexamethasone;Moist Heat;Ultrasound;Gait training;Functional mobility  training;Stair training;Patient/family education;Neuromuscular re-education;Therapeutic activities;Therapeutic exercise;Manual techniques;Passive range of motion;Vasopneumatic Device;Taping;Scar mobilization   PT Next Visit Plan Rt knee A/ROM, strength, mobs, P/ROM, Game Ready   Consulted and Agree with Plan of Care Patient      Patient will benefit from skilled therapeutic intervention in order to improve the following deficits and impairments:  Difficulty walking, Decreased range of motion, Abnormal gait, Decreased endurance, Pain, Increased edema, Decreased strength, Decreased scar mobility, Impaired flexibility, Decreased activity tolerance  Visit Diagnosis: Acute pain of right knee  Localized edema  Other abnormalities of gait and mobility     Problem List Patient Active Problem List   Diagnosis Date Noted  . 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 Unilateral primary osteoarthritis, right knee   . Special screening for malignant neoplasms, colon 12/29/2014  . Diarrhea 12/29/2014  . Medication management 05/13/2013  . Does not have health insurance 05/13/2013  . Adjustment reaction with anxiety and depression 03/12/2012  . Medication monitoring encounter 03/12/2012  . URI, acute 08/13/2011  . Hemorrhoids 08/13/2011  . Perineal itching, female 08/13/2011  . LBBB (left bundle branch block) 09/24/2010  . Murmur 09/08/2010  . EKG, abnormal 08/19/2010  . Vaginitis and vulvovaginitis 08/19/2010  . Visit for preventive health examination 08/19/2010  . Rapid palpitations 08/18/2010  . Dizziness 08/18/2010  . Arthritis 08/18/2010  . ANXIETY 06/18/2008  . GRIEF REACTION 06/18/2008  . DEPRESSION, MAJOR, RECURRENT 07/06/2006  . TOBACCO DEPENDENCE 07/06/2006  . ARTHRALGIA, UNSPECIFIED 07/06/2006    Dessa Phi PTA 04/15/2016, 10:26 AM  [facility] [address], [address] [place], [place], [address] Phone:  [phone]   Fax:  [phone]  Name: GENINE BECKETT MRN: [id number] Date of Birth: 23-Sep-1955

## 2016-04-18 ENCOUNTER — Encounter: Payer: Self-pay | Admitting: Physical Therapy

## 2016-04-18 ENCOUNTER — Inpatient Hospital Stay (HOSPITAL_COMMUNITY)
Admission: AD | Admit: 2016-04-18 | Discharge: 2016-04-21 | DRG: 863 | Disposition: A | Discharge status: Home / Self Care | Payer: BLUE CROSS/BLUE SHIELD | Source: Ambulatory Visit | Attending: Orthopedic Surgery | Admitting: Orthopedic Surgery

## 2016-04-18 ENCOUNTER — Ambulatory Visit (INDEPENDENT_AMBULATORY_CARE_PROVIDER_SITE_OTHER): Payer: BLUE CROSS/BLUE SHIELD | Admitting: Orthopedic Surgery

## 2016-04-18 ENCOUNTER — Encounter (HOSPITAL_COMMUNITY): Payer: Self-pay | Admitting: *Deleted

## 2016-04-18 ENCOUNTER — Other Ambulatory Visit (INDEPENDENT_AMBULATORY_CARE_PROVIDER_SITE_OTHER): Payer: Self-pay | Admitting: Family

## 2016-04-18 ENCOUNTER — Ambulatory Visit: Payer: BLUE CROSS/BLUE SHIELD | Admitting: Physical Therapy

## 2016-04-18 DIAGNOSIS — F419 Anxiety disorder, unspecified: Secondary | ICD-10-CM | POA: Diagnosis present

## 2016-04-18 DIAGNOSIS — Z96651 Presence of right artificial knee joint: Secondary | ICD-10-CM

## 2016-04-18 DIAGNOSIS — Z8249 Family history of ischemic heart disease and other diseases of the circulatory system: Secondary | ICD-10-CM

## 2016-04-18 DIAGNOSIS — F329 Major depressive disorder, single episode, unspecified: Secondary | ICD-10-CM | POA: Diagnosis present

## 2016-04-18 DIAGNOSIS — M419 Scoliosis, unspecified: Secondary | ICD-10-CM | POA: Diagnosis present

## 2016-04-18 DIAGNOSIS — M6281 Muscle weakness (generalized): Secondary | ICD-10-CM | POA: Diagnosis present

## 2016-04-18 DIAGNOSIS — K589 Irritable bowel syndrome without diarrhea: Secondary | ICD-10-CM | POA: Diagnosis present

## 2016-04-18 DIAGNOSIS — M25561 Pain in right knee: Secondary | ICD-10-CM

## 2016-04-18 DIAGNOSIS — L03115 Cellulitis of right lower limb: Secondary | ICD-10-CM | POA: Diagnosis present

## 2016-04-18 DIAGNOSIS — Z87891 Personal history of nicotine dependence: Secondary | ICD-10-CM

## 2016-04-18 DIAGNOSIS — Z79899 Other long term (current) drug therapy: Secondary | ICD-10-CM | POA: Diagnosis not present

## 2016-04-18 DIAGNOSIS — R600 Localized edema: Secondary | ICD-10-CM

## 2016-04-18 DIAGNOSIS — M25661 Stiffness of right knee, not elsewhere classified: Secondary | ICD-10-CM | POA: Diagnosis present

## 2016-04-18 DIAGNOSIS — Z823 Family history of stroke: Secondary | ICD-10-CM | POA: Diagnosis not present

## 2016-04-18 DIAGNOSIS — T814XXA Infection following a procedure, initial encounter: Secondary | ICD-10-CM | POA: Diagnosis present

## 2016-04-18 DIAGNOSIS — Z7982 Long term (current) use of aspirin: Secondary | ICD-10-CM

## 2016-04-18 DIAGNOSIS — R2689 Other abnormalities of gait and mobility: Secondary | ICD-10-CM | POA: Diagnosis present

## 2016-04-18 DIAGNOSIS — Y838 Other surgical procedures as the cause of abnormal reaction of the patient, or of later complication, without mention of misadventure at the time of the procedure: Secondary | ICD-10-CM | POA: Diagnosis present

## 2016-04-18 HISTORY — DX: Cellulitis of unspecified part of limb: L03.119

## 2016-04-18 HISTORY — DX: Cellulitis of right lower limb: L03.115

## 2016-04-18 LAB — COMPREHENSIVE METABOLIC PANEL
ALK PHOS: 77 U/L (ref 38–126)
ALT: 14 U/L (ref 14–54)
AST: 17 U/L (ref 15–41)
Albumin: 4 g/dL (ref 3.5–5.0)
Anion gap: 10 (ref 5–15)
BUN: 14 mg/dL (ref 6–20)
CALCIUM: 9.7 mg/dL (ref 8.9–10.3)
CHLORIDE: 103 mmol/L (ref 101–111)
CO2: 27 mmol/L (ref 22–32)
CREATININE: 0.69 mg/dL (ref 0.44–1.00)
Glucose, Bld: 90 mg/dL (ref 65–99)
Potassium: 3.9 mmol/L (ref 3.5–5.1)
Sodium: 140 mmol/L (ref 135–145)
Total Bilirubin: 0.6 mg/dL (ref 0.3–1.2)
Total Protein: 6.8 g/dL (ref 6.5–8.1)

## 2016-04-18 LAB — CBC
HCT: 37.5 % (ref 36.0–46.0)
Hemoglobin: 12.4 g/dL (ref 12.0–15.0)
MCH: 29.2 pg (ref 26.0–34.0)
MCHC: 33.1 g/dL (ref 30.0–36.0)
MCV: 88.4 fL (ref 78.0–100.0)
PLATELETS: 280 10*3/uL (ref 150–400)
RBC: 4.24 MIL/uL (ref 3.87–5.11)
RDW: 13.4 % (ref 11.5–15.5)
WBC: 7.1 10*3/uL (ref 4.0–10.5)

## 2016-04-18 LAB — SEDIMENTATION RATE: SED RATE: 17 mm/h (ref 0–22)

## 2016-04-18 LAB — C-REACTIVE PROTEIN: CRP: 0.8 mg/dL (ref <1.0)

## 2016-04-18 MED ORDER — ONDANSETRON HCL 4 MG PO TABS: 4.0000 mg | ORAL_TABLET | Freq: Four times a day (QID) | ORAL | Status: DC | PRN

## 2016-04-18 MED ORDER — ONDANSETRON HCL 4 MG/2ML IJ SOLN: 4.0000 mg | Freq: Four times a day (QID) | INTRAMUSCULAR | Status: DC | PRN

## 2016-04-18 MED ORDER — ALPRAZOLAM 0.5 MG PO TABS
0.5000 mg | ORAL_TABLET | Freq: Three times a day (TID) | ORAL | Status: DC | PRN
  Administered 2016-04-18 – 2016-04-20 (×3): 0.5 mg via ORAL
  Filled 2016-04-18 (×3): qty 1

## 2016-04-18 MED ORDER — PIPERACILLIN-TAZOBACTAM 3.375 G IVPB
3.3750 g | Freq: Three times a day (TID) | INTRAVENOUS | Status: DC
  Administered 2016-04-18 – 2016-04-21 (×9): 3.375 g via INTRAVENOUS
  Filled 2016-04-18 (×10): qty 50

## 2016-04-18 MED ORDER — HYDROMORPHONE HCL 2 MG/ML IJ SOLN
0.5000 mg | INTRAMUSCULAR | Status: DC | PRN
  Administered 2016-04-18 – 2016-04-21 (×11): 0.5 mg via INTRAVENOUS
  Filled 2016-04-18 (×11): qty 1

## 2016-04-18 MED ORDER — VANCOMYCIN HCL IN DEXTROSE 1-5 GM/200ML-% IV SOLN
1000.0000 mg | Freq: Two times a day (BID) | INTRAVENOUS | Status: DC
  Administered 2016-04-19 – 2016-04-21 (×5): 1000 mg via INTRAVENOUS
  Filled 2016-04-18 (×6): qty 200

## 2016-04-18 MED ORDER — ACETAMINOPHEN 650 MG RE SUPP: 650.0000 mg | Freq: Four times a day (QID) | RECTAL | Status: DC | PRN

## 2016-04-18 MED ORDER — CITALOPRAM HYDROBROMIDE 20 MG PO TABS
20.0000 mg | ORAL_TABLET | Freq: Every day | ORAL | Status: DC
  Administered 2016-04-18 – 2016-04-20 (×3): 20 mg via ORAL
  Filled 2016-04-18 (×3): qty 1

## 2016-04-18 MED ORDER — SENNOSIDES-DOCUSATE SODIUM 8.6-50 MG PO TABS
1.0000 | ORAL_TABLET | Freq: Every evening | ORAL | Status: DC | PRN
  Administered 2016-04-19: 1 via ORAL
  Filled 2016-04-18: qty 1

## 2016-04-18 MED ORDER — VANCOMYCIN HCL IN DEXTROSE 1-5 GM/200ML-% IV SOLN
1000.0000 mg | Freq: Once | INTRAVENOUS | Status: AC
  Administered 2016-04-18: 1000 mg via INTRAVENOUS
  Filled 2016-04-18: qty 200

## 2016-04-18 MED ORDER — ACETAMINOPHEN 325 MG PO TABS: 650.0000 mg | ORAL_TABLET | Freq: Four times a day (QID) | ORAL | Status: DC | PRN

## 2016-04-18 NOTE — H&P (Signed)
Valerie Love is an 60 y.o. female.   Chief Complaint: Painful cellulitis inferior aspect surgical incision right knee HPI: Patient is 60 year old woman who is status post a right total knee arthroplasty. She presented on Friday with some mild cellulitis over the inferior aspect of the incision she had no signs or symptoms of a joint infection the patient was started on doxycycline and presented to the office today in follow-up. Examination this time showed a small drop of purulent drainage this was sterilely prepped anesthetized deep cultures were obtained there is no deep abscess no clinical signs of a septic joint however she had increasing tenderness and increasing cellulitis over the inferior aspect of the incision patient was admitted urgently for IV vancomycin and and Zosyn with possible surgical debridement if not showing improvement in 2 days.  Past Medical History:  Diagnosis Date  . Anxiety   . Arthritis    R knee, back, hands   . Childhood asthma   . Complication of anesthesia    woke up slowly - 60yr. ago  . Depression    related to husband illness and death  . History of jaundice    as a child    . Hx of varicella   . Hyperthyroidism    during pregnancy, treated & resolved post partum   . IBS (irritable bowel syndrome)   . LBBB (left bundle branch block)   . Scoliosis   . Thyroid disease in pregnancy     Past Surgical History:  Procedure Laterality Date  . BREAST BIOPSY Left 80  . BREAST BIOPSY Right 1979   benign   . Cyst removed from ovary    . KNEE SURGERY     right duda   . TONSILLECTOMY AND ADENOIDECTOMY  1073  . TOTAL KNEE ARTHROPLASTY 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   Procedure: TOTAL KNEE ARTHROPLASTY;  Surgeon: MNewt Minion MD;  [facility]  Service: Orthopedics;  Laterality: Right;  . TUBAL LIGATION    . WISDOM TOOTH EXTRACTION      Family History  Problem Relation Age of Onset  . Alcohol abuse Mother     died from stomach ulcers  . Stroke Father     died   from cva and mi  . Hypertension Father   . Hyperlipidemia Father   . Coronary artery disease Father     Age 60 . Colon polyps Sister   . Hepatitis C Brother   . Colon cancer Neg Hx    Social History:  reports that she quit smoking about 3 months ago. Her smoking use included Cigarettes. She has a 10.00 pack-year smoking history. She has never used smokeless tobacco. She reports that she drinks alcohol. She reports that she does not use drugs.  Allergies:  Allergies  Allergen Reactions  . Codeine Itching, Swelling, Rash and Other (See Comments)    SWELLING REACTION UNSPECIFIED     Medications Prior to Admission  Medication Sig Dispense Refill  . ALPRAZolam (XANAX) 0.5 MG tablet 1 po as needed, maximum 3 x per day , avoid regular use: Do not take with alcohol (Patient taking differently: Take 0.25-0.5 mg by mouth 3 (three) times daily as needed for anxiety. ) 30 tablet 0  . aspirin EC 81 MG tablet Take 81 mg by mouth daily.    . calcium carbonate (OS-CAL) 600 MG TABS tablet Take 600 mg by mouth daily.    . Cholecalciferol (VITAMIN D3 PO) Take 1 capsule by mouth daily.    .Marland Kitchen  citalopram (CELEXA) 20 MG tablet TAKE 1 TABLET BY MOUTH DAILY (Patient taking differently: TAKE 20 MG BY MOUTH DAILY AT BEDTIME) 90 tablet 1  . dicyclomine (BENTYL) 10 MG capsule Take 1 capsule (10 mg total) by mouth every 6 (six) hours as needed for spasms. (Patient taking differently: Take 10 mg by mouth every 6 (six) hours as needed (abdominal spasms). ) 60 capsule 3  . diphenhydrAMINE (BENADRYL) 25 MG tablet Take 25 mg by mouth at bedtime as needed for sleep.    . diphenhydramine-acetaminophen (TYLENOL PM) 25-500 MG TABS tablet Take 2 tablets by mouth at bedtime.     Marland Kitchen doxycycline (VIBRA-TABS) 100 MG tablet Take 1 tablet (100 mg total) by mouth 2 (two) times daily. 60 tablet 0  . HYDROmorphone (DILAUDID) 2 MG tablet Take 1 tablet (2 mg total) by mouth every 6 (six) hours as needed for moderate pain or severe pain.  40 tablet 0  . Multiple Vitamins-Minerals (ALIVE ONCE DAILY WOMENS 50+) TABS Take 1 tablet by mouth daily.    . mupirocin ointment (BACTROBAN) 2 % Apply 1 application topically 2 (two) times daily. Apply to the affected area 2 times a day 22 g 3  . naproxen sodium (ANAPROX) 220 MG tablet Take 440 mg by mouth daily.    . Probiotic Product (TRUBIOTICS) CAPS Take 1 capsule by mouth daily.    . Turmeric 500 MG TABS Take 500 mg by mouth daily.    . vitamin B-12 (CYANOCOBALAMIN) 500 MCG tablet Take 500 mcg by mouth daily.    Marland Kitchen albuterol (PROVENTIL HFA;VENTOLIN HFA) 108 (90 BASE) MCG/ACT inhaler Inhale 2 puffs into the lungs every 6 (six) hours as needed for wheezing or shortness of breath. (Patient not taking: Reported on 04/18/2016) 1 Inhaler 1  . escitalopram (LEXAPRO) 10 MG tablet Take 1 tablet (10 mg total) by mouth daily. (Patient not taking: Reported on 04/18/2016) 90 tablet 3    Results for orders placed or performed during the hospital encounter of 04/18/16 (from the past 48 hour(s))  C-reactive protein     Status: None   Collection Time: 04/18/16  3:32 PM  Result Value Ref Range   CRP 0.8 <1.0 mg/dL  CBC     Status: None   Collection Time: 04/18/16  3:32 PM  Result Value Ref Range   WBC 7.1 4.0 - 10.5 K/uL   RBC 4.24 3.87 - 5.11 MIL/uL   Hemoglobin 12.4 12.0 - 15.0 g/dL   HCT 37.5 36.0 - 46.0 %   MCV 88.4 78.0 - 100.0 fL   MCH 29.2 26.0 - 34.0 pg   MCHC 33.1 30.0 - 36.0 g/dL   RDW 13.4 11.5 - 15.5 %   Platelets 280 150 - 400 K/uL  Comprehensive metabolic panel     Status: None   Collection Time: 04/18/16  3:32 PM  Result Value Ref Range   Sodium 140 135 - 145 mmol/L   Potassium 3.9 3.5 - 5.1 mmol/L   Chloride 103 101 - 111 mmol/L   CO2 27 22 - 32 mmol/L   Glucose, Bld 90 65 - 99 mg/dL   BUN 14 6 - 20 mg/dL   Creatinine, Ser 0.69 0.44 - 1.00 mg/dL   Calcium 9.7 8.9 - 10.3 mg/dL   Total Protein 6.8 6.5 - 8.1 g/dL   Albumin 4.0 3.5 - 5.0 g/dL   AST 17 15 - 41 U/L   ALT 14 14  - 54 U/L   Alkaline Phosphatase 77 38 - 126  U/L   Total Bilirubin 0.6 0.3 - 1.2 mg/dL   GFR calc non Af Amer >60 >60 mL/min   GFR calc Af Amer >60 >60 mL/min    Comment: (NOTE) The eGFR has been calculated using the CKD EPI equation. This calculation has not been validated in all clinical situations. eGFR's persistently <60 mL/min signify possible Chronic Kidney Disease.    Anion gap 10 5 - 15   No results found.  ROS  Blood pressure 136/86, pulse 67, temperature 97.6 F (36.4 C), temperature source Oral, SpO2 97 %. Physical Exam on examination patient is alert oriented no adenopathy well-dressed affect normal respiratory effort she does have some induration and cellulitis over the inferior aspect of the incision. There is no pain with range of motion of her knee there is no effusion the knee no tenderness to palpation around the patella and knee joint.  Assessment/Plan Assessment: Cellulitis inferior aspect total knee arthroplasty incision without clinical signs or symptoms of a septic joint.  Plan we will admit the patient this time place her on IV vancomycin and Zosyn discussed that if were not showing improvements by Wednesday we will set her up for open surgical irrigation and debridement.  Newt Minion, MD 04/18/2016, 4:48 PM

## 2016-04-18 NOTE — Progress Notes (Signed)
Pharmacy Antibiotic Note  Valerie Love is a 60 y.o. female s/p right TKA now with cellulitis at surgical site despite po doxycycline. She is being admitted for vancomycin and zosyn with plan for debridement on Wednesday if no improvement. Renal function wnl.  Vancomycin trough goal 10-15  Plan: 1) Vancomycin 1g IV q12 2) Zosyn 3.375g IV q8 (4 hour infusion) 3) Follow renal function, cultures, LOT, level if needed     Temp (24hrs), Avg:97.6 F (36.4 C), Min:97.6 F (36.4 C), Max:97.6 F (36.4 C)   Recent Labs Lab 04/18/16 1532  WBC 7.1  CREATININE 0.69    Estimated Creatinine Clearance: 67.8 mL/min (by C-G formula based on SCr of 0.69 mg/dL).    Allergies  Allergen Reactions  . Codeine Itching, Swelling, Rash and Other (See Comments)    SWELLING REACTION UNSPECIFIED     Antimicrobials this admission: PO doxycyline PTA 12/11 Vancomycin >> 12/11 Zosyn >>  Dose adjustments this admission: n/a  Microbiology results: none  Thank you for allowing pharmacy to be a part of this patient's care.  Fredrik Rigger 04/18/2016 6:26 PM

## 2016-04-18 NOTE — Progress Notes (Signed)
Office Visit Note   Patient: Valerie Love           Date of Birth: Mar 11, 1956           MRN: [id number] Visit Date: 04/18/2016              Requested by: Madelin Headings, MD [address] [place], [place] [address] PCP: Lorretta Harp, MD   Assessment & Plan: Visit Diagnoses:  1. S/P total knee arthroplasty, right     Plan: We will directly admit the patient today for initiation of vancomycin and Zosyn IV antibiotics. Patient has no clinical signs or symptoms of infection of the knee joint and will try several days of IV antibiotics. If this does not resolve the inferior cellulitis would plan for surgical debridement on Wednesday.  Follow-Up Instructions: Return in about 2 weeks (around 05/02/2016).   Orders:  Orders Placed This Encounter  Procedures  . Wound culture   No orders of the defined types were placed in this encounter.     Procedures: No procedures performed   Clinical Data: No additional findings.   Subjective: Chief Complaint  Patient presents with  . Right Knee - Follow-up    03/16/16 right total knee arthroplasty    Patient taking Doxy bid and Bactroban dressing changes BID. Was in the office last week. The redness has spread a little further up the incision and appears a little more swollen. She states that it is a little more painful.    Review of Systems   Objective: Vital Signs: There were no vitals taken for this visit.  Physical Exam patient states that the inferior aspect of her incision is more painful she states she still has a drop of drainage from the inferior aspect. Examination the inferior half of the incision is tender to palpation with mild induration or redness. The joint is nontender to palpation no pain with range of motion of the joint no effusion within the joint. After informed consent the inferior aspect the incision was prepped using Betadine and locally anesthetized with 1 mL of 1% lidocaine plain a half a  centimeter incision was made over the area of the drainage and deep cultures were obtained. There was no retained suture or any other foreign body there is no deep purulent drainage.  Ortho Exam  Specialty Comments:  No specialty comments available.  Imaging: No results found.   PMFS History: Patient Active Problem List   Diagnosis Date Noted  . 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 Unilateral primary osteoarthritis, right knee   . Special screening for malignant neoplasms, colon 12/29/2014  . Diarrhea 12/29/2014  . Medication management 05/13/2013  . Does not have health insurance 05/13/2013  . Adjustment reaction with anxiety and depression 03/12/2012  . Medication monitoring encounter 03/12/2012  . URI, acute 08/13/2011  . Hemorrhoids 08/13/2011  . Perineal itching, female 08/13/2011  . LBBB (left bundle branch block) 09/24/2010  . Murmur 09/08/2010  . EKG, abnormal 08/19/2010  . Vaginitis and vulvovaginitis 08/19/2010  . Visit for preventive health examination 08/19/2010  . Rapid palpitations 08/18/2010  . Dizziness 08/18/2010  . Arthritis 08/18/2010  . ANXIETY 06/18/2008  . GRIEF REACTION 06/18/2008  . DEPRESSION, MAJOR, RECURRENT 07/06/2006  . TOBACCO DEPENDENCE 07/06/2006  . ARTHRALGIA, UNSPECIFIED 07/06/2006   Past Medical History:  Diagnosis Date  . Anxiety   . Arthritis    R knee, back, hands   . Childhood asthma   . Complication of anesthesia  woke up slowly - 7438yrs. ago  . Depression    related to husband illness and death  . History of jaundice    as a child    . Hx of varicella   . Hyperthyroidism    during pregnancy, treated & resolved post partum   . IBS (irritable bowel syndrome)   . LBBB (left bundle branch block)   . Scoliosis   . Thyroid disease in pregnancy     Family History  Problem Relation Age of Onset  . Alcohol abuse Mother     died from stomach ulcers  . Stroke Father     died  from cva and mi  . Hypertension  Father   . Hyperlipidemia Father   . Coronary artery disease Father     Age 60  . Colon polyps Sister   . Hepatitis C Brother   . Colon cancer Neg Hx     Past Surgical History:  Procedure Laterality Date  . BREAST BIOPSY Left 80  . BREAST BIOPSY Right 1979   benign   . Cyst removed from ovary    . KNEE SURGERY     right Garry Bochicchio   . TONSILLECTOMY AND ADENOIDECTOMY  1073  . TOTAL KNEE ARTHROPLASTY 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   Procedure: TOTAL KNEE ARTHROPLASTY;  Surgeon: Nadara MustardMarcus V Tamarius Rosenfield, MD;  Location: [facility] OR;  Service: Orthopedics;  Laterality: Right;  . TUBAL LIGATION    . WISDOM TOOTH EXTRACTION     Social History   Occupational History  . self     Catering and cleaning   Social History Main Topics  . Smoking status: Former Smoker    Packs/day: 0.25    Years: 40.00    Types: Cigarettes    Quit date: 01/06/2016  . Smokeless tobacco: Never Used     Comment: none in about 1 week  . Alcohol use 0.0 oz/week     Comment: socially-wine- daily  . Drug use: No  . Sexual activity: Not on file

## 2016-04-18 NOTE — Therapy (Signed)
[facility] [address] [place], [place], [address] Phone: [phone]   Fax:  [phone]  Physical Therapy Treatment  Patient Details  Name: Valerie Love MRN: [id number] Date of Birth: [birth year] Referring Provider: Aldean Baker, MD  Encounter Date: 04/18/2016      PT End of Session - 04/18/16 1240    Visit Number 7   Date for PT Re-Evaluation 05/31/16   Authorization Type 6   Authorization - Visit Number 7   Authorization - Number of Visits 23   PT Start Time 1231   PT Stop Time 1318   PT Time Calculation (min) 47 min   Activity Tolerance Patient tolerated treatment well   Behavior During Therapy [facility] for tasks assessed/performed      Past Medical History:  Diagnosis Date  . Anxiety   . Arthritis    R knee, back, hands   . Childhood asthma   . Complication of anesthesia    woke up slowly - 52yrs. ago  . Depression    related to husband illness and death  . History of jaundice    as a child    . Hx of varicella   . Hyperthyroidism    during pregnancy, treated & resolved post partum   . IBS (irritable bowel syndrome)   . LBBB (left bundle branch block)   . Scoliosis   . Thyroid disease in pregnancy     Past Surgical History:  Procedure Laterality Date  . BREAST BIOPSY Left 80  . BREAST BIOPSY Right 1979   benign   . Cyst removed from ovary    . KNEE SURGERY     right duda   . TONSILLECTOMY AND ADENOIDECTOMY  1073  . TOTAL KNEE ARTHROPLASTY 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   Procedure: TOTAL KNEE ARTHROPLASTY;  Surgeon: [name], MD;  Location: [facility] OR;  Service: Orthopedics;  Laterality: Right;  . TUBAL LIGATION    . WISDOM TOOTH EXTRACTION      There were no vitals filed for this visit.      Subjective Assessment - 04/18/16 1239    Subjective Pt having increased pain today. Infection sight looks better, no open wound and no discharge. Pt is returning to MD today after therapy for follow up  visit reguarding infection. Pt c/o increased swelling and pain and pt having increased anxiety about infection and swelling.    Pertinent History Rt TKA: 03/16/16   Limitations Walking;Standing   How long can you walk comfortably? walking limited by fatigue 5-8 minutes   Patient Stated Goals improve gait, reduce knee pain, improve Rt knee A/ROM   Currently in Pain? Yes   Pain Score 7    Pain Location Knee   Pain Orientation Right   Pain Descriptors / Indicators Aching   Pain Type Surgical pain   Pain Onset 1 to 4 weeks ago   Pain Frequency Intermittent                         [facility] Adult PT Treatment/Exercise - 04/18/16 0001      Knee/Hip Exercises: Aerobic   Stationary Bike Level 0 for ROM: x 8 minutes  Therapist present to discuss treatment     Knee/Hip Exercises: Supine   Heel Slides Right;1 set;20 reps;AROM  5 sets with 5 second hold   Straight Leg Raises Strengthening;Right;2 sets;10 reps     Modalities   Modalities Cryotherapy     Vasopneumatic  Number Minutes Vasopneumatic  15 minutes   Vasopnuematic Location  Knee   Vasopneumatic Pressure Medium   Vasopneumatic Temperature  3 snowflakes     Manual Therapy   Manual Therapy Soft tissue mobilization   Manual therapy comments Pt supine   Soft tissue mobilization To distal quads, hmastring  Increase circulation and relax muscles                  PT Short Term Goals - 04/18/16 1242      PT SHORT TERM GOAL #2   Title demonstrate Rt knee A/ROM extension to lacking < or = to 10 degrees to normalize gait   Time 4   Period Weeks   Status On-going     PT SHORT TERM GOAL #3   Title improve Rt LE strength to walk for 10-12 minutes without need to rest   Time 4   Period Weeks   Status On-going     PT SHORT TERM GOAL #4   Title ascend step with step-over-step gait with use of 1 rail > 50% of the time   Time 4   Period Weeks   Status On-going           PT Long Term Goals - 04/18/16  1243      PT LONG TERM GOAL #1   Title be independent in advanced HEP   Time 8   Period Weeks   Status On-going     PT LONG TERM GOAL #2   Title reduce FOTO to < or = to 48% limitation   Time 8   Period Weeks   Status On-going     PT LONG TERM GOAL #3   Title demonstrate Rt knee A/ROM flexion to > or = to 110 degrees to improve squatting and steps   Time 8   Period Weeks   Status On-going     PT LONG TERM GOAL #4   Title report < or = to 2/10 Rt knee pain with standing and walking   Time 8   Period Weeks   Status On-going     PT LONG TERM GOAL #5   Title improve Rt LE strength to descend steps with step-over-step gait > or = to 50% of the time   Time 8   Period Weeks   Status On-going     PT LONG TERM GOAL #6   Title demonstrate Rt knee A/ROM extension to lacking < or = to 5 degrees to normalize gait pattern   Time 8   Period Weeks   Status On-going               Plan - 04/18/16 1309    Clinical Impression Statement Pt incision is looking better, pt continues to have a lot of anxiety about infection and increased swelling. Pt limited by pain today with exercises. Pt will continue to benefit from skilled therapy for LE strength and ROM.    Rehab Potential Good   PT Frequency 3x / week   PT Duration 8 weeks   PT Treatment/Interventions ADLs/Self Care Home Management;Cryotherapy;Electrical Stimulation;Iontophoresis 4mg /ml Dexamethasone;Moist Heat;Ultrasound;Gait training;Functional mobility training;Stair training;Patient/family education;Neuromuscular re-education;Therapeutic activities;Therapeutic exercise;Manual techniques;Passive range of motion;Vasopneumatic Device;Taping;Scar mobilization   PT Next Visit Plan Continue with standing balance exercsies as tolerated   Consulted and Agree with Plan of Care Patient      Patient will benefit from skilled therapeutic intervention in order to improve the following deficits and impairments:  Difficulty walking,  Decreased range of  motion, Abnormal gait, Decreased endurance, Pain, Increased edema, Decreased strength, Decreased scar mobility, Impaired flexibility, Decreased activity tolerance  Visit Diagnosis: Acute pain of right knee  Localized edema  Other abnormalities of gait and mobility     Problem List Patient Active Problem List   Diagnosis Date Noted  . 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 Unilateral primary osteoarthritis, right knee   . Special screening for malignant neoplasms, colon 12/29/2014  . Diarrhea 12/29/2014  . Medication management 05/13/2013  . Does not have health insurance 05/13/2013  . Adjustment reaction with anxiety and depression 03/12/2012  . Medication monitoring encounter 03/12/2012  . URI, acute 08/13/2011  . Hemorrhoids 08/13/2011  . Perineal itching, female 08/13/2011  . LBBB (left bundle branch block) 09/24/2010  . Murmur 09/08/2010  . EKG, abnormal 08/19/2010  . Vaginitis and vulvovaginitis 08/19/2010  . Visit for preventive health examination 08/19/2010  . Rapid palpitations 08/18/2010  . Dizziness 08/18/2010  . Arthritis 08/18/2010  . ANXIETY 06/18/2008  . GRIEF REACTION 06/18/2008  . DEPRESSION, MAJOR, RECURRENT 07/06/2006  . TOBACCO DEPENDENCE 07/06/2006  . ARTHRALGIA, UNSPECIFIED 07/06/2006    Dessa PhiKatherine Ryen Rhames PTA 04/18/2016, 1:20 PM  [facility] [address] [place], [place], [address] Phone: [phone]   Fax:  [phone]  Name: Valerie Love MRN: [id number] Date of Birth: December 18, 1955

## 2016-04-19 ENCOUNTER — Encounter (HOSPITAL_COMMUNITY): Payer: Self-pay | Admitting: General Practice

## 2016-04-19 NOTE — Progress Notes (Signed)
Patient ID: Valerie Love, female   DOB: [birth year], 60 y.o.   MRN: [id number] Symptomatically patient's knee is much improved the redness is resolving nicely. Patient's white cell count C-reactive protein and sedimentation rate are all within normal limits. Culture results are pending. We'll continue IV vancomycin and Zosyn antibiotics until culture results are finalized.

## 2016-04-19 NOTE — Evaluation (Signed)
Physical Therapy Evaluation Patient Details Name: Valerie Love MRN: [id number] DOB: [birth year] Today's Date: 04/19/2016   History of Present Illness  Admitted for antibiotics secondary to cellulitis/prululent drainage from R knee; TKA 11/08; PMH L BBB, depression, anxiety  Clinical Impression  Patient evaluated by Physical Therapy with no further acute PT needs identified. All education has been completed and the patient has no further questions.  See below for any follow-up Physical Therapy or equipment needs. PT is signing off. Thank you for this referral.     Follow Up Recommendations Outpatient PT    Equipment Recommendations  None recommended by PT    Recommendations for Other Services       Precautions / Restrictions Precautions Precautions: None Restrictions Weight Bearing Restrictions: No      Mobility  Bed Mobility Overal bed mobility: Independent                Transfers Overall transfer level: Independent Equipment used: Straight cane;None             General transfer comment: No difficulty  Ambulation/Gait Ambulation/Gait assistance: Modified independent (Device/Increase time) Ambulation Distance (Feet): 500 Feet Assistive device: Straight cane (and pushing IV) Gait Pattern/deviations: WFL(Within Functional Limits)        Stairs Stairs: Yes   Stair Management: No rails;Alternating pattern;Forwards Number of Stairs: 5    Wheelchair Mobility    Modified Rankin (Stroke Patients Only)       Balance Overall balance assessment: Independent                                           Pertinent Vitals/Pain Pain Assessment: 0-10 Pain Score: 3  Pain Location: R knee Pain Descriptors / Indicators: Aching Pain Intervention(s): Monitored during session    Home Living Family/patient expects to be discharged to:: Private residence Living Arrangements: Children Available Help at Discharge: Family Type of Home:  House Home Access: Stairs to enter Entrance Stairs-Rails: None Entrance Stairs-Number of Steps: 2 Home Layout: Two level;Able to live on main level with bedroom/bathroom Home Equipment: Dan Humphreys - 2 wheels;Cane - single point;Bedside commode;Shower seat;Hand held shower head      Prior Function Level of Independence: Independent               Hand Dominance   Dominant Hand: Right    Extremity/Trunk Assessment   Upper Extremity Assessment: Overall WFL for tasks assessed           Lower Extremity Assessment: Overall WFL for tasks assessed         Communication   Communication: No difficulties  Cognition Arousal/Alertness: Awake/alert Behavior During Therapy: WFL for tasks assessed/performed Overall Cognitive Status: Within Functional Limits for tasks assessed                      General Comments General comments (skin integrity, edema, etc.): Overall Valerie Love reports feeling much better; she is hopeful to avoid surgery    Exercises     Assessment/Plan    PT Assessment All further PT needs can be met in the next venue of care  PT Problem List Decreased strength;Decreased range of motion;Decreased activity tolerance;Pain          PT Treatment Interventions      PT Goals (Current goals can be found in the Care Plan section)  Acute Rehab PT Goals Patient Stated Goal:  Back to progress PT Goal Formulation: All assessment and education complete, DC therapy    Frequency     Barriers to discharge        Co-evaluation               End of Session   Activity Tolerance: Patient tolerated treatment well Patient left: Other (comment) (managing independently in the room) Nurse Communication: Mobility status (managing independently)         Time: 6578-4696 PT Time Calculation (min) (ACUTE ONLY): 16 min   Charges:   PT Evaluation $PT Eval Low Complexity: 1 Procedure     PT G CodesColletta Love 04/19/2016, 12:52  PM  Valerie Love, [place] Pager [phone] Office [phone]

## 2016-04-20 ENCOUNTER — Ambulatory Visit (INDEPENDENT_AMBULATORY_CARE_PROVIDER_SITE_OTHER): Payer: BLUE CROSS/BLUE SHIELD | Admitting: Orthopedic Surgery

## 2016-04-20 ENCOUNTER — Encounter: Payer: BLUE CROSS/BLUE SHIELD | Admitting: Physical Therapy

## 2016-04-20 MED ORDER — LOPERAMIDE HCL 2 MG PO CAPS
4.0000 mg | ORAL_CAPSULE | Freq: Two times a day (BID) | ORAL | Status: DC | PRN
Start: 2016-04-20 — End: 2016-04-21
  Administered 2016-04-20: 4 mg via ORAL
  Filled 2016-04-20: qty 2

## 2016-04-20 MED ORDER — SACCHAROMYCES BOULARDII 250 MG PO CAPS
250.0000 mg | ORAL_CAPSULE | Freq: Two times a day (BID) | ORAL | Status: DC
  Administered 2016-04-20 – 2016-04-21 (×3): 250 mg via ORAL
  Filled 2016-04-20 (×3): qty 1

## 2016-04-20 NOTE — Progress Notes (Signed)
Pt having episodes of diarrhea and has been unable to make it into the bathroom at times. She became tearful and very upset about this. Dr. Lajoyce Corners notified and requested an  Anti-diarrheal. immodium was ordered.

## 2016-04-20 NOTE — Progress Notes (Signed)
Patient ID: Valerie Love, female   DOB: March 24, 1956, 60 y.o.   MRN: [id number] The cellulitis has completely resolved from the right knee swelling has decreased there is no tenderness to palpation at this time. The incision site has good bleeding. Bactroban was reapplied. Cultures are still pending. Patient has been ambulating independently without problems.

## 2016-04-21 ENCOUNTER — Other Ambulatory Visit (INDEPENDENT_AMBULATORY_CARE_PROVIDER_SITE_OTHER): Payer: Self-pay

## 2016-04-21 ENCOUNTER — Telehealth (INDEPENDENT_AMBULATORY_CARE_PROVIDER_SITE_OTHER): Payer: Self-pay | Admitting: Orthopedic Surgery

## 2016-04-21 DIAGNOSIS — M25561 Pain in right knee: Secondary | ICD-10-CM

## 2016-04-21 LAB — WOUND CULTURE
GRAM STAIN: NONE SEEN
Gram Stain: NONE SEEN
Gram Stain: NONE SEEN

## 2016-04-21 MED ORDER — SULFAMETHOXAZOLE-TRIMETHOPRIM 800-160 MG PO TABS: 1.0000 | ORAL_TABLET | Freq: Two times a day (BID) | ORAL | 0 refills | Status: DC

## 2016-04-21 MED ORDER — HYDROMORPHONE HCL 2 MG PO TABS: 2.0000 mg | ORAL_TABLET | Freq: Four times a day (QID) | ORAL | 0 refills | Status: DC | PRN

## 2016-04-21 NOTE — Progress Notes (Signed)
Called results to pt. Voiced understanding and will call with questions.

## 2016-04-21 NOTE — Progress Notes (Unsigned)
I called lab and they state that a prelim report has come back for the pt that they will fax over. Right knee culture shows few group B strep isolated and negative gram stain.

## 2016-04-21 NOTE — Progress Notes (Signed)
Patient ID: ANTHEA STASIAK, female   DOB: [birth year], 60 y.o.   MRN: [id number] On examination patient's cellulitis has completely resolved. There is no tenderness to palpation no pain with range of motion of the knee no effusion of the joint. Cultures remain negative today. We will plan for discharge to home continue with the Bactroban ointment dressing changes daily we will write a prescription for Bactrim DS discontinue her doxycycline. Follow-up in the office in 2 weeks.

## 2016-04-21 NOTE — Telephone Encounter (Signed)
Autumn has already made referral in [facility] to where patient had [facility] previously. I called Heather to make her aware.

## 2016-04-21 NOTE — Care Management Note (Signed)
Case Management Note  Patient Details  Name: Valerie Love MRN: [id number] Date of Birth: 01/22/1956  Subjective/Objective:                    Action/Plan:  PT recommending Outpatient PT . Called Dr Audrie Lia office for order, spoke with Eunice Blase . Awaiting call back. Expected Discharge Date:                  Expected Discharge Plan:     In-House Referral:     Discharge planning Services  CM Consult  Post Acute Care Choice:    Choice offered to:     DME Arranged:    DME Agency:     HH Arranged:    HH Agency:     Status of Service:  In process, will continue to follow  If discussed at Long Length of Stay Meetings, dates discussed:    Additional Comments:  Kingsley Plan, RN 04/21/2016, 11:01 AM

## 2016-04-21 NOTE — Discharge Summary (Signed)
Discharge Diagnoses:  Active Problems:   Cellulitis of right knee   Surgeries:  None   Consultants:  none  Discharged Condition: Improved  Hospital Course: Valerie Love is an 60 y.o. female who was admitted 04/18/2016 with a chief complaint of cellulitis abscess inferior aspect of the incision of her total knee., with a final diagnosis of cellulitis right leg..  Patient underwent admission IV antibiotics with vancomycin and Zosyn and the cellulitis resolved. Cultures remain negative. Patient was discharged on oral Bactrim DS.  Patient was given perioperative antibiotics: Anti-infectives    Start     Dose/Rate Route Frequency Ordered Stop   04/21/16 0000  sulfamethoxazole-trimethoprim (BACTRIM DS) 800-160 MG tablet     1 tablet Oral 2 times daily 04/21/16 0707     04/19/16 0400  vancomycin (VANCOCIN) IVPB 1000 mg/200 mL premix     1,000 mg 200 mL/hr over 60 Minutes Intravenous Every 12 hours 04/18/16 1826     04/18/16 1600  piperacillin-tazobactam (ZOSYN) IVPB 3.375 g     3.375 g 12.5 mL/hr over 240 Minutes Intravenous Every 8 hours 04/18/16 1526     04/18/16 1600  vancomycin (VANCOCIN) IVPB 1000 mg/200 mL premix     1,000 mg 200 mL/hr over 60 Minutes Intravenous  Once 04/18/16 1529 04/18/16 1732    .  Patient was given sequential compression devices, early ambulation, and aspirin for DVT prophylaxis.  Recent vital signs: Patient Vitals for the past 24 hrs:  BP Temp Temp src Pulse Resp SpO2  04/21/16 0425 124/74 98 F (36.7 C) Oral 67 16 98 %  04/20/16 2020 128/69 98 F (36.7 C) Oral 64 16 98 %  04/20/16 1551 134/72 98.1 F (36.7 C) Oral 71 18 99 %  .  Recent laboratory studies: No results found.  Discharge Medications:     Medication List    STOP taking these medications   albuterol 108 (90 Base) MCG/ACT inhaler Commonly known as:  PROVENTIL HFA;VENTOLIN HFA   doxycycline 100 MG tablet Commonly known as:  VIBRA-TABS   escitalopram 10 MG tablet Commonly known  as:  LEXAPRO     TAKE these medications   ALIVE ONCE DAILY WOMENS 50+ Tabs Take 1 tablet by mouth daily.   ALPRAZolam 0.5 MG tablet Commonly known as:  XANAX 1 po as needed, maximum 3 x per day , avoid regular use: Do not take with alcohol What changed:  how much to take  how to take this  when to take this  reasons to take this  additional instructions   aspirin EC 81 MG tablet Take 81 mg by mouth daily.   calcium carbonate 600 MG Tabs tablet Commonly known as:  OS-CAL Take 600 mg by mouth daily.   citalopram 20 MG tablet Commonly known as:  CELEXA TAKE 1 TABLET BY MOUTH DAILY What changed:  See the new instructions.   dicyclomine 10 MG capsule Commonly known as:  BENTYL Take 1 capsule (10 mg total) by mouth every 6 (six) hours as needed for spasms. What changed:  reasons to take this   diphenhydrAMINE 25 MG tablet Commonly known as:  BENADRYL Take 25 mg by mouth at bedtime as needed for sleep.   diphenhydramine-acetaminophen 25-500 MG Tabs tablet Commonly known as:  TYLENOL PM Take 2 tablets by mouth at bedtime.   HYDROmorphone 2 MG tablet Commonly known as:  DILAUDID Take 1 tablet (2 mg total) by mouth every 6 (six) hours as needed for moderate pain or severe pain.  mupirocin ointment 2 % Commonly known as:  BACTROBAN Apply 1 application topically 2 (two) times daily. Apply to the affected area 2 times a day   naproxen sodium 220 MG tablet Commonly known as:  ANAPROX Take 440 mg by mouth daily.   sulfamethoxazole-trimethoprim 800-160 MG tablet Commonly known as:  BACTRIM DS Take 1 tablet by mouth 2 (two) times daily.   TRUBIOTICS Caps Take 1 capsule by mouth daily.   Turmeric 500 MG Tabs Take 500 mg by mouth daily.   vitamin B-12 500 MCG tablet Commonly known as:  CYANOCOBALAMIN Take 500 mcg by mouth daily.   VITAMIN D3 PO Take 1 capsule by mouth daily.       Diagnostic Studies: Xr Knee 1-2 Views Right  Result Date:  03/30/2016 Two-view radiographs the right knee show stable alignment total knee arthroplasty. There is no varus or valgus malalignment no hyperextension or flexion malalignment.   Patient benefited maximally from their hospital stay and there were no complications.     Disposition: 06-Home-Health Care Svc Discharge Instructions    Call MD / Call 911    Complete by:  As directed    If you experience chest pain or shortness of breath, CALL 911 and be transported to the hospital emergency room.  If you develope a fever above 101 F, pus (white drainage) or increased drainage or redness at the wound, or calf pain, call your surgeon's office.   Constipation Prevention    Complete by:  As directed    Drink plenty of fluids.  Prune juice may be helpful.  You may use a stool softener, such as Colace (over the counter) 100 mg twice a day.  Use MiraLax (over the counter) for constipation as needed.   Diet - low sodium heart healthy    Complete by:  As directed    Increase activity slowly as tolerated    Complete by:  As directed      Follow-up Information    Nadara MustardMarcus V Duda, MD Follow up in 3 week(s).   Specialty:  Orthopedic Surgery Contact information: [address] [place] [address] [phone]            Signed: Nadara MustardMarcus V Duda 04/21/2016, 7:07 AM

## 2016-04-21 NOTE — Telephone Encounter (Signed)
Case Manager calling re patient being discharged today.  Therapist is recommending out patient Physical Therapy  She will need verbal today.  Please advise [phone]

## 2016-04-22 ENCOUNTER — Ambulatory Visit: Payer: BLUE CROSS/BLUE SHIELD | Admitting: Physical Therapy

## 2016-04-22 DIAGNOSIS — M25661 Stiffness of right knee, not elsewhere classified: Secondary | ICD-10-CM | POA: Diagnosis present

## 2016-04-22 DIAGNOSIS — R2689 Other abnormalities of gait and mobility: Secondary | ICD-10-CM

## 2016-04-22 DIAGNOSIS — R600 Localized edema: Secondary | ICD-10-CM | POA: Diagnosis present

## 2016-04-22 DIAGNOSIS — M6281 Muscle weakness (generalized): Secondary | ICD-10-CM | POA: Diagnosis present

## 2016-04-22 DIAGNOSIS — M25561 Pain in right knee: Secondary | ICD-10-CM | POA: Diagnosis not present

## 2016-04-22 NOTE — Therapy (Signed)
[facility] [address] [place], [place], [address] Phone: [phone]   Fax:  [phone]  Physical Therapy Treatment  Patient Details  Name: Valerie Love MRN: [id number] Date of Birth: 09-04-1955 Referring Provider: Meridee Score, MD  Encounter Date: 04/22/2016      PT End of Session - 04/22/16 1147    Visit Number 8   Date for PT Re-Evaluation 05/31/16   Authorization Type [employer] 23 visits   Authorization - Number of Visits 23   PT Start Time 1056   PT Stop Time 1150   PT Time Calculation (min) 54 min   Activity Tolerance Patient tolerated treatment well      Past Medical History:  Diagnosis Date  . Anxiety   . Arthritis    R knee, back, hands   . Cellulitis of knee 04/2016   RT KNEE  . Childhood asthma   . Complication of anesthesia    woke up slowly - 11yr. ago  . Depression    related to husband illness and death  . History of jaundice    as a child    . Hx of varicella   . Hyperthyroidism    during pregnancy, treated & resolved post partum   . IBS (irritable bowel syndrome)   . LBBB (left bundle branch block)   . Scoliosis   . Thyroid disease in pregnancy     Past Surgical History:  Procedure Laterality Date  . BREAST BIOPSY Left 80  . BREAST BIOPSY Right 1979   benign   . Cyst removed from ovary    . KNEE SURGERY     right duda   . TONSILLECTOMY AND ADENOIDECTOMY  1073  . TOTAL KNEE ARTHROPLASTY 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   Procedure: TOTAL KNEE ARTHROPLASTY;  Surgeon: MNewt Minion MD;  [facility]  Service: Orthopedics;  Laterality: Right;  . TUBAL LIGATION    . WISDOM TOOTH EXTRACTION      There were no vitals filed for this visit.      Subjective Assessment - 04/22/16 1100    Subjective Patient returns with new order to continue PT following hospitalization for an infection.  The doctor wants me to get that knee straight/flat but I think the bending is the problem.     Pertinent History Rt TKA: 03/16/16   Currently in Pain? Yes   Pain Score 3    Pain Location Knee   Pain Orientation Right   Pain Type Surgical pain   Pain Frequency Constant   Aggravating Factors  bending/straightening knee; during the injection            [facility] - 04[facility]      AROM   Right Knee Extension 9 supine   Right Knee Flexion 98 supine 97 in seated       12-97 seated right knee ROM              [facility] Adult PT Treatment/Exercise - 04/22/16 0001      Knee/Hip Exercises: Aerobic   Nustep L1 x 8 minutes  Therapist present to discuss treatment     Knee/Hip Exercises: Standing   Other Standing Knee Exercises retro stepping 20x     Knee/Hip Exercises: Supine   Other Supine Knee/Hip Exercises knee flexion with feet on green ball 3x10   Other Supine Knee/Hip Exercises HS sets on ball 10x     Cryotherapy   Number Minutes Cryotherapy 10 Minutes   Cryotherapy  Location Knee   Type of Cryotherapy Ice pack     Manual Therapy   Manual Therapy Joint mobilization;Soft tissue mobilization;Passive ROM;Muscle Energy Technique   Joint Mobilization patellar mobs superior/inferior/medial lateral grade 3 10xeach   Soft tissue mobilization quad, HS   Passive ROM knee flexion with gentle overpressure 15x   Muscle Energy Technique HS contract/relax with HS soft tissue 3x5 sec                PT Education - 04/22/16 1143    Education provided Yes   Education Details knee flexion and extension every 2 hours   Person(s) Educated Patient   Methods Explanation;Demonstration;Handout   Comprehension Verbalized understanding;Returned demonstration          PT Short Term Goals - 04/22/16 1200      PT SHORT TERM GOAL #1   Title be independent in initial HEP   Status Achieved     PT SHORT TERM GOAL #2   Title demonstrate Rt knee A/ROM extension to lacking < or = to 10 degrees to normalize gait   Time 4   Period Weeks   Status Partially Met      PT SHORT TERM GOAL #3   Title improve Rt LE strength to walk for 10-12 minutes without need to rest   Time 4   Period Weeks   Status On-going     PT SHORT TERM GOAL #4   Title ascend step with step-over-step gait with use of 1 rail > 50% of the time   Time 4   Period Weeks   Status On-going           PT Long Term Goals - 04/22/16 1200      PT LONG TERM GOAL #1   Title be independent in advanced HEP   Time 8   Period Weeks   Status On-going     PT LONG TERM GOAL #2   Title reduce FOTO to < or = to 48% limitation   Time 8   Period Weeks   Status On-going     PT LONG TERM GOAL #3   Title demonstrate Rt knee A/ROM flexion to > or = to 110 degrees to improve squatting and steps   Period Weeks   Status On-going     PT LONG TERM GOAL #4   Title report < or = to 2/10 Rt knee pain with standing and walking   Time 8   Period Weeks   Status On-going     PT LONG TERM GOAL #5   Title improve Rt LE strength to descend steps with step-over-step gait > or = to 50% of the time   Period Weeks   Status On-going     PT LONG TERM GOAL #6   Title demonstrate Rt knee A/ROM extension to lacking < or = to 5 degrees to normalize gait pattern   Time 8   Period Weeks   Status On-going               Plan - 04/22/16 1150    Clinical Impression Statement The patient arrives following recent hospitalization for knee infection.  Her knee ROM has improved in flexion to 98 degrees, lacks 10 degrees of extension.  The patient reports no increase in pain with ex but does report fatigue post treatment session.  Mild to moderate swelling but patient wishes to hold on vasocompression until next week secondary to sensitivity.  Therapist closely monitoring response with all interventions.  PT Next Visit Plan right knee ROM for flexion and extension;  patellar mobs and soft tissue;  quad activation;  resume vasocompression as needed      Patient will benefit from skilled therapeutic  intervention in order to improve the following deficits and impairments:     Visit Diagnosis: Acute pain of right knee  Localized edema  Other abnormalities of gait and mobility  Muscle weakness (generalized)  Stiffness of right knee, not elsewhere classified     Problem List Patient Active Problem List   Diagnosis Date Noted  . Cellulitis of right knee 04/18/2016  . 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 Unilateral primary osteoarthritis, right knee   . Special screening for malignant neoplasms, colon 12/29/2014  . Diarrhea 12/29/2014  . Medication management 05/13/2013  . Does not have health insurance 05/13/2013  . Adjustment reaction with anxiety and depression 03/12/2012  . Medication monitoring encounter 03/12/2012  . URI, acute 08/13/2011  . Hemorrhoids 08/13/2011  . Perineal itching, female 08/13/2011  . LBBB (left bundle branch block) 09/24/2010  . Murmur 09/08/2010  . EKG, abnormal 08/19/2010  . Vaginitis and vulvovaginitis 08/19/2010  . Visit for preventive health examination 08/19/2010  . Rapid palpitations 08/18/2010  . Dizziness 08/18/2010  . Arthritis 08/18/2010  . ANXIETY 06/18/2008  . GRIEF REACTION 06/18/2008  . DEPRESSION, MAJOR, RECURRENT 07/06/2006  . TOBACCO DEPENDENCE 07/06/2006  . ARTHRALGIA, UNSPECIFIED 07/06/2006   Ruben Im, PT 04/22/16 12:03 PM Phone: [phone] Fax: [phone]  Alvera Singh 04/22/2016, 12:02 PM  [facility] [address], [place], [place], [address] Phone: [phone]   Fax:  [phone]  Name: Valerie Love MRN: [id number] Date of Birth: 22-Sep-1955

## 2016-04-22 NOTE — Patient Instructions (Signed)
Every 2 hours 3-5 minutes WAYS TO MAKE YOUR KNEE GO STRAIGHT      WAYS TO MAKE YOUR KNEE BEND     Lavinia Sharps PT

## 2016-04-25 ENCOUNTER — Telehealth (INDEPENDENT_AMBULATORY_CARE_PROVIDER_SITE_OTHER): Payer: Self-pay | Admitting: Orthopedic Surgery

## 2016-04-25 ENCOUNTER — Encounter: Payer: Self-pay | Admitting: Physical Therapy

## 2016-04-25 ENCOUNTER — Ambulatory Visit: Payer: BLUE CROSS/BLUE SHIELD | Admitting: Physical Therapy

## 2016-04-25 DIAGNOSIS — M25561 Pain in right knee: Secondary | ICD-10-CM

## 2016-04-25 DIAGNOSIS — R2689 Other abnormalities of gait and mobility: Secondary | ICD-10-CM

## 2016-04-25 DIAGNOSIS — M6281 Muscle weakness (generalized): Secondary | ICD-10-CM

## 2016-04-25 DIAGNOSIS — R600 Localized edema: Secondary | ICD-10-CM

## 2016-04-25 DIAGNOSIS — M25661 Stiffness of right knee, not elsewhere classified: Secondary | ICD-10-CM

## 2016-04-25 NOTE — Telephone Encounter (Signed)
Patient had surgery Nov 8th, R knee. She states she was in our office on Dec 7th for her post op visit and then again with infection on Dec 11th,. She was started on antibiotics on the same day. She now sees something in the knee (she describes as white and thread-like..possibly a suture).  Please call and advise.

## 2016-04-25 NOTE — Therapy (Signed)
[facility] [address] [place], [place], [address] Phone: [phone]   Fax:  [phone]  Physical Therapy Treatment  Patient Details  Name: Valerie Love MRN: [id number] Date of Birth: 06-27-1955 Referring Provider: Aldean Bakeruda, Marcus, MD  Encounter Date: 04/25/2016      PT End of Session - 04/25/16 1327    Visit Number 9   Date for PT Re-Evaluation 05/31/16   Authorization Type [employer] 23 visits   Authorization - Number of Visits 23   PT Start Time 1230   PT Stop Time 1328   PT Time Calculation (min) 58 min   Activity Tolerance Patient tolerated treatment well   Behavior During Therapy [facility] for tasks assessed/performed      Past Medical History:  Diagnosis Date  . Anxiety   . Arthritis    R knee, back, hands   . Cellulitis of knee 04/2016   RT KNEE  . Childhood asthma   . Complication of anesthesia    woke up slowly - 3959yrs. ago  . Depression    related to husband illness and death  . History of jaundice    as a child    . Hx of varicella   . Hyperthyroidism    during pregnancy, treated & resolved post partum   . IBS (irritable bowel syndrome)   . LBBB (left bundle branch block)   . Scoliosis   . Thyroid disease in pregnancy     Past Surgical History:  Procedure Laterality Date  . BREAST BIOPSY Left 80  . BREAST BIOPSY Right 1979   benign   . Cyst removed from ovary    . KNEE SURGERY     right duda   . TONSILLECTOMY AND ADENOIDECTOMY  1073  . TOTAL KNEE ARTHROPLASTY 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   Procedure: TOTAL KNEE ARTHROPLASTY;  Surgeon: Nadara MustardMarcus V Duda, MD;  Location: [facility];  Service: Orthopedics;  Laterality: Right;  . TUBAL LIGATION    . WISDOM TOOTH EXTRACTION      There were no vitals filed for this visit.      Subjective Assessment - 04/25/16 1247    Subjective Pt repots doing better but feels frustrated because of the set back since the infection.  States she still sees a string in the  incision that was infected and she thought that the doctor had removed that.     Pertinent History Rt TKA: 03/16/16   Limitations Walking;Standing   Patient Stated Goals improve gait, reduce knee pain, improve Rt knee A/ROM   Currently in Pain? Yes   Pain Score 3    Pain Location Knee   Pain Orientation Right   Pain Descriptors / Indicators Aching   Pain Type Surgical pain   Pain Onset 1 to 4 weeks ago   Pain Frequency Constant   Multiple Pain Sites No                         [facility] Adult PT Treatment/Exercise - 04/25/16 0001      Knee/Hip Exercises: Stretches   Active Hamstring Stretch 3 reps;20 seconds;Right  Ankle porpped on foam roll x2 minutes   Other Knee/Hip Stretches knee flexion ROM on step   Other Knee/Hip Stretches low load long duration stretch in supine with ice pack and 5lb weight, towel roll under ankle x 6 min      Knee/Hip Exercises: Aerobic   Nustep L1 x 8 minutes  Therapist present to  discuss treatment     Knee/Hip Exercises: Machines for Strengthening   Total Gym Leg Press 70# bilateral 30x     Knee/Hip Exercises: Standing   Hip Abduction Both;10 reps;Knee straight;Stengthening   Hip Extension Stengthening;Both;10 reps;Knee straight   Forward Step Up 20 reps;Hand Hold: 2;Step Height: 6"   Other Standing Knee Exercises side steps, monster walks, retro stepping - 3x down and back on carpet - yellow band 3x down and back     Knee/Hip Exercises: Seated   Knee/Hip Flexion stool scoots on carpet - using BLE 3x down and back   Sit to Sand 10 reps;without UE support     Cryotherapy   Number Minutes Cryotherapy 10 Minutes   Cryotherapy Location Knee   Type of Cryotherapy Ice pack     Manual Therapy   Manual Therapy Joint mobilization;Soft tissue mobilization;Passive ROM;Muscle Energy Technique   Joint Mobilization tibiofemoral A/P, P/A glides, patellar mobs superior/inferior/medial lateral grade 3 10xeach   Soft tissue mobilization IT band    Passive ROM knee flexion in prone for quad stretch (90 degrees)                PT Education - 04/25/16 1329    Education provided Yes   Education Details add band to side steps at home - pt given a yellow band   Person(s) Educated Patient   Methods Explanation   Comprehension Verbalized understanding          PT Short Term Goals - 04/25/16 1328      PT SHORT TERM GOAL #2   Title demonstrate Rt knee A/ROM extension to lacking < or = to 10 degrees to normalize gait   Time 4   Period Weeks   Status On-going     PT SHORT TERM GOAL #3   Title improve Rt LE strength to walk for 10-12 minutes without need to rest   Time 4   Period Weeks   Status On-going     PT SHORT TERM GOAL #4   Title ascend step with step-over-step gait with use of 1 rail > 50% of the time   Time 4   Period Weeks   Status On-going           PT Long Term Goals - 04/22/16 1200      PT LONG TERM GOAL #1   Title be independent in advanced HEP   Time 8   Period Weeks   Status On-going     PT LONG TERM GOAL #2   Title reduce FOTO to < or = to 48% limitation   Time 8   Period Weeks   Status On-going     PT LONG TERM GOAL #3   Title demonstrate Rt knee A/ROM flexion to > or = to 110 degrees to improve squatting and steps   Period Weeks   Status On-going     PT LONG TERM GOAL #4   Title report < or = to 2/10 Rt knee pain with standing and walking   Time 8   Period Weeks   Status On-going     PT LONG TERM GOAL #5   Title improve Rt LE strength to descend steps with step-over-step gait > or = to 50% of the time   Period Weeks   Status On-going     PT LONG TERM GOAL #6   Title demonstrate Rt knee A/ROM extension to lacking < or = to 5 degrees to normalize gait pattern   Time  8   Period Weeks   Status On-going               Plan - 04/25/16 1333    Clinical Impression Statement Pt continues to have limited ROM extension and flexion and has a lot of discomfort with PROM  effected by swelling and tight quads and hamstrings as well as joint capsule hypomobility.  Pt able to increase resistence and progressing to single leg standing exercises.  Skilled PT needed for improved ROM especially extension for improved gait pattern.   Rehab Potential Good   PT Frequency 3x / week   PT Duration 8 weeks   PT Treatment/Interventions ADLs/Self Care Home Management;Cryotherapy;Electrical Stimulation;Iontophoresis 4mg /ml Dexamethasone;Moist Heat;Ultrasound;Gait training;Functional mobility training;Stair training;Patient/family education;Neuromuscular re-education;Therapeutic activities;Therapeutic exercise;Manual techniques;Passive range of motion;Vasopneumatic Device;Taping;Scar mobilization   PT Next Visit Plan right knee ROM for flexion and extension;  patellar mobs and soft tissue;  quad activation;  resume vasocompression as needed   Consulted and Agree with Plan of Care Patient      Patient will benefit from skilled therapeutic intervention in order to improve the following deficits and impairments:  Difficulty walking, Decreased range of motion, Abnormal gait, Decreased endurance, Pain, Increased edema, Decreased strength, Decreased scar mobility, Impaired flexibility, Decreased activity tolerance  Visit Diagnosis: Acute pain of right knee  Localized edema  Other abnormalities of gait and mobility  Muscle weakness (generalized)  Stiffness of right knee, not elsewhere classified     Problem List Patient Active Problem List   Diagnosis Date Noted  . Cellulitis of right knee 04/18/2016  . 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 Unilateral primary osteoarthritis, right knee   . Special screening for malignant neoplasms, colon 12/29/2014  . Diarrhea 12/29/2014  . Medication management 05/13/2013  . Does not have health insurance 05/13/2013  . Adjustment reaction with anxiety and depression 03/12/2012  . Medication monitoring encounter 03/12/2012  .  URI, acute 08/13/2011  . Hemorrhoids 08/13/2011  . Perineal itching, female 08/13/2011  . LBBB (left bundle branch block) 09/24/2010  . Murmur 09/08/2010  . EKG, abnormal 08/19/2010  . Vaginitis and vulvovaginitis 08/19/2010  . Visit for preventive health examination 08/19/2010  . Rapid palpitations 08/18/2010  . Dizziness 08/18/2010  . Arthritis 08/18/2010  . ANXIETY 06/18/2008  . GRIEF REACTION 06/18/2008  . DEPRESSION, MAJOR, RECURRENT 07/06/2006  . TOBACCO DEPENDENCE 07/06/2006  . ARTHRALGIA, UNSPECIFIED 07/06/2006    Vincente Poli, PT 04/25/2016, 2:10 PM  [facility] [address], [address] [place], [place], [address] Phone: [phone]   Fax:  [phone]  Name: Valerie Love MRN: [id number] Date of Birth: 28-Aug-1955

## 2016-04-25 NOTE — Telephone Encounter (Signed)
Can you please call pt and make an appt for sometime this week? He has plenty of space. Given her history and recent infection I would feel better if he were to see it. thanks

## 2016-04-26 NOTE — Telephone Encounter (Signed)
Ok. Appt made for Wednesday,  Dec 20th @ 2:15.  She states she'll be able to come here after her physical therapy appointment.

## 2016-04-27 ENCOUNTER — Encounter (INDEPENDENT_AMBULATORY_CARE_PROVIDER_SITE_OTHER): Payer: Self-pay | Admitting: Orthopedic Surgery

## 2016-04-27 ENCOUNTER — Encounter: Payer: Self-pay | Admitting: Physical Therapy

## 2016-04-27 ENCOUNTER — Ambulatory Visit: Payer: BLUE CROSS/BLUE SHIELD | Admitting: Physical Therapy

## 2016-04-27 ENCOUNTER — Ambulatory Visit (INDEPENDENT_AMBULATORY_CARE_PROVIDER_SITE_OTHER): Payer: BLUE CROSS/BLUE SHIELD | Admitting: Orthopedic Surgery

## 2016-04-27 ENCOUNTER — Other Ambulatory Visit: Payer: Self-pay | Admitting: Internal Medicine

## 2016-04-27 DIAGNOSIS — M1711 Unilateral primary osteoarthritis, right knee: Secondary | ICD-10-CM

## 2016-04-27 DIAGNOSIS — M25661 Stiffness of right knee, not elsewhere classified: Secondary | ICD-10-CM

## 2016-04-27 DIAGNOSIS — R600 Localized edema: Secondary | ICD-10-CM

## 2016-04-27 DIAGNOSIS — Z96651 Presence of right artificial knee joint: Secondary | ICD-10-CM

## 2016-04-27 DIAGNOSIS — R2689 Other abnormalities of gait and mobility: Secondary | ICD-10-CM

## 2016-04-27 DIAGNOSIS — M25561 Pain in right knee: Secondary | ICD-10-CM

## 2016-04-27 DIAGNOSIS — M6281 Muscle weakness (generalized): Secondary | ICD-10-CM

## 2016-04-27 DIAGNOSIS — L03115 Cellulitis of right lower limb: Secondary | ICD-10-CM

## 2016-04-27 NOTE — Progress Notes (Signed)
Office Visit Note   Patient: Valerie KiltsJudy T Schreier           Date of Birth: 02-23-1956           MRN: [id number] Visit Date: 04/27/2016              Requested by: Madelin HeadingsWanda K Panosh, MD [address] [place], [place] [address] PCP: Lorretta HarpPANOSH,WANDA KOTVAN, MD   Assessment & Plan: Visit Diagnoses:  1. Unilateral primary osteoarthritis, right knee   2. S/P total knee arthroplasty, right   3. Cellulitis of right knee     Plan: Patient is making excellent improvement she'll complete her course of antibiotics she was symptomatic for a stitch abscess which is resolving nicely. More suture knot was removed from the wound today. Continue the antibiotic ointment continue the oral antibiotics follow-up as scheduled for early January  Follow-Up Instructions: Return in about 3 weeks (around 05/18/2016).   Orders:  No orders of the defined types were placed in this encounter.  No orders of the defined types were placed in this encounter.     Procedures: No procedures performed   Clinical Data: No additional findings.   Subjective: Chief Complaint  Patient presents with  . Right Knee - Wound Check    Patient presents for evaluation of right knee, status post total knee arthroplasty with cellulitis. She was put on IV vancomycin and zosyn. She was also put on doxycycline orally. She called office after noticing a questionable white foreign body bottom of her incision.     Review of Systems   Objective: Vital Signs: There were no vitals taken for this visit.  Physical Exam patient cellulitis has completely resolved. It does appear that she had a stitch abscess. A little bit of the Vicryl suture was removed today through the inferior incision. There is no cellulitis no drainage no signs of infection. Patient has range of motion from 5-100.  Ortho Exam  Specialty Comments:  No specialty comments available.  Imaging: No results found.   PMFS History: Patient Active Problem List     Diagnosis Date Noted  . Cellulitis of right knee 04/18/2016  . 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 Unilateral primary osteoarthritis, right knee   . Special screening for malignant neoplasms, colon 12/29/2014  . Diarrhea 12/29/2014  . Medication management 05/13/2013  . Does not have health insurance 05/13/2013  . Adjustment reaction with anxiety and depression 03/12/2012  . Medication monitoring encounter 03/12/2012  . URI, acute 08/13/2011  . Hemorrhoids 08/13/2011  . Perineal itching, female 08/13/2011  . LBBB (left bundle branch block) 09/24/2010  . Murmur 09/08/2010  . EKG, abnormal 08/19/2010  . Vaginitis and vulvovaginitis 08/19/2010  . Visit for preventive health examination 08/19/2010  . Rapid palpitations 08/18/2010  . Dizziness 08/18/2010  . Arthritis 08/18/2010  . ANXIETY 06/18/2008  . GRIEF REACTION 06/18/2008  . DEPRESSION, MAJOR, RECURRENT 07/06/2006  . TOBACCO DEPENDENCE 07/06/2006  . ARTHRALGIA, UNSPECIFIED 07/06/2006   Past Medical History:  Diagnosis Date  . Anxiety   . Arthritis    R knee, back, hands   . Cellulitis of knee 04/2016   RT KNEE  . Childhood asthma   . Complication of anesthesia    woke up slowly - 60yrs. ago  . Depression    related to husband illness and death  . History of jaundice    as a child    . Hx of varicella   . Hyperthyroidism    during pregnancy,  treated & resolved post partum   . IBS (irritable bowel syndrome)   . LBBB (left bundle branch block)   . Scoliosis   . Thyroid disease in pregnancy     Family History  Problem Relation Age of Onset  . Alcohol abuse Mother     died from stomach ulcers  . Stroke Father     died  from cva and mi  . Hypertension Father   . Hyperlipidemia Father   . Coronary artery disease Father     Age 17  . Colon polyps Sister   . Hepatitis C Brother   . Colon cancer Neg Hx     Past Surgical History:  Procedure Laterality Date  . BREAST BIOPSY Left 80  . BREAST  BIOPSY Right 1979   benign   . Cyst removed from ovary    . KNEE SURGERY     right duda   . TONSILLECTOMY AND ADENOIDECTOMY  1073  . TOTAL KNEE ARTHROPLASTY 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   Procedure: TOTAL KNEE ARTHROPLASTY;  Surgeon: [name], MD;  Location: [facility] OR;  Service: Orthopedics;  Laterality: Right;  . TUBAL LIGATION    . WISDOM TOOTH EXTRACTION     Social History   Occupational History  . self     Catering and cleaning   Social History Main Topics  . Smoking status: Former Smoker    Packs/day: 0.25    Years: 40.00    Types: Cigarettes    Quit date: 01/06/2016  . Smokeless tobacco: Never Used     Comment: none in about 1 week  . Alcohol use 0.0 oz/week     Comment: socially-wine- daily  . Drug use: No  . Sexual activity: Not on file

## 2016-04-27 NOTE — Therapy (Signed)
[facility] [address], [address] [place], [place], [address] Phone: [phone]   Fax:  [phone]  Physical Therapy Treatment  Patient Details  Name: Valerie Love MRN: [id number] Date of Birth: 1955-07-03 Referring Provider: Aldean Baker, MD  Encounter Date: 04/27/2016      PT End of Session - 04/27/16 1230    Visit Number 10   Date for PT Re-Evaluation 05/31/16   Authorization Type BCBS 23 visits   PT Start Time 1226   PT Stop Time 1324   PT Time Calculation (min) 58 min   Activity Tolerance Patient tolerated treatment well   Behavior During Therapy [facility] for tasks assessed/performed      Past Medical History:  Diagnosis Date  . Anxiety   . Arthritis    R knee, back, hands   . Cellulitis of knee 04/2016   RT KNEE  . Childhood asthma   . Complication of anesthesia    woke up slowly - 6yrs. ago  . Depression    related to husband illness and death  . History of jaundice    as a child    . Hx of varicella   . Hyperthyroidism    during pregnancy, treated & resolved post partum   . IBS (irritable bowel syndrome)   . LBBB (left bundle branch block)   . Scoliosis   . Thyroid disease in pregnancy     Past Surgical History:  Procedure Laterality Date  . BREAST BIOPSY Left 80  . BREAST BIOPSY Right 1979   benign   . Cyst removed from ovary    . KNEE SURGERY     right duda   . TONSILLECTOMY AND ADENOIDECTOMY  1073  . TOTAL KNEE ARTHROPLASTY 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   Procedure: TOTAL KNEE ARTHROPLASTY;  Surgeon: [name], MD;  Location: [facility] OR;  Service: Orthopedics;  Laterality: Right;  . TUBAL LIGATION    . WISDOM TOOTH EXTRACTION      There were no vitals filed for this visit.      Subjective Assessment - 04/27/16 1227    Subjective Pt reports seeing another stich in her knee today and is going to MD after therapy. Pt reports having some muscle pain on Bil lateral knee    Pertinent History Rt TKA:  03/16/16   Limitations Walking;Standing   How long can you walk comfortably? walking limited by fatigue 5-8 minutes   Currently in Pain? Yes   Pain Score 4    Pain Location Knee   Pain Orientation Right   Pain Descriptors / Indicators Aching   Pain Type Surgical pain   Pain Onset 1 to 4 weeks ago   Pain Frequency Constant                         [facility] Adult PT Treatment/Exercise - 04/27/16 0001      Knee/Hip Exercises: Stretches   Active Hamstring Stretch --  Ankle porpped on foam roll x2 minutes #5   Other Knee/Hip Stretches knee flexion ROM on step     Knee/Hip Exercises: Aerobic   Nustep L1 x 8 minutes  Therapist present to discuss treatment     Knee/Hip Exercises: Standing   Hip Abduction Both;Knee straight;Stengthening;2 sets;10 reps   Hip Extension Stengthening;Both;10 reps;Knee straight;2 sets   Forward Step Up 20 reps;Hand Hold: 2;Step Height: 6"   SLS Blue mat  20 seconds Rt leg     Knee/Hip Exercises: Seated  Sit to [employer]Sand 20 reps     Knee/Hip Exercises: Supine   Short Arc Quad Sets Strengthening;Right;20 reps   Straight Leg Raises Strengthening;Right;2 sets;10 reps     Modalities   Modalities Cryotherapy     Vasopneumatic   Number Minutes Vasopneumatic  15 minutes   Vasopnuematic Location  Knee   Vasopneumatic Pressure Medium   Vasopneumatic Temperature  3 snowflakes                  PT Short Term Goals - 04/25/16 1328      PT SHORT TERM GOAL #2   Title demonstrate Rt knee A/ROM extension to lacking < or = to 10 degrees to normalize gait   Time 4   Period Weeks   Status On-going     PT SHORT TERM GOAL #3   Title improve Rt LE strength to walk for 10-12 minutes without need to rest   Time 4   Period Weeks   Status On-going     PT SHORT TERM GOAL #4   Title ascend step with step-over-step gait with use of 1 rail > 50% of the time   Time 4   Period Weeks   Status On-going           PT Long Term Goals - 04/22/16  1200      PT LONG TERM GOAL #1   Title be independent in advanced HEP   Time 8   Period Weeks   Status On-going     PT LONG TERM GOAL #2   Title reduce FOTO to < or = to 48% limitation   Time 8   Period Weeks   Status On-going     PT LONG TERM GOAL #3   Title demonstrate Rt knee A/ROM flexion to > or = to 110 degrees to improve squatting and steps   Period Weeks   Status On-going     PT LONG TERM GOAL #4   Title report < or = to 2/10 Rt knee pain with standing and walking   Time 8   Period Weeks   Status On-going     PT LONG TERM GOAL #5   Title improve Rt LE strength to descend steps with step-over-step gait > or = to 50% of the time   Period Weeks   Status On-going     PT LONG TERM GOAL #6   Title demonstrate Rt knee A/ROM extension to lacking < or = to 5 degrees to normalize gait pattern   Time 8   Period Weeks   Status On-going               Plan - 04/27/16 1310    Clinical Impression Statement Pt able to make full revolutions on stationary bike today. Able to tolerate all exercsies well. Continues to have limited ROM in flexion and extension but progressing well. Pt will continue to benefit from skilled therapy for knee strength and stability.    Rehab Potential Good   PT Frequency 3x / week   PT Duration 8 weeks   PT Treatment/Interventions ADLs/Self Care Home Management;Cryotherapy;Electrical Stimulation;Iontophoresis 4mg /ml Dexamethasone;Moist Heat;Ultrasound;Gait training;Functional mobility training;Stair training;Patient/family education;Neuromuscular re-education;Therapeutic activities;Therapeutic exercise;Manual techniques;Passive range of motion;Vasopneumatic Device;Taping;Scar mobilization   PT Next Visit Plan Knee strength, stability, balance. Measure ROM   Consulted and Agree with Plan of Care Patient      Patient will benefit from skilled therapeutic intervention in order to improve the following deficits and impairments:  Difficulty walking,  Decreased range of motion, Abnormal gait, Decreased endurance, Pain, Increased edema, Decreased strength, Decreased scar mobility, Impaired flexibility, Decreased activity tolerance  Visit Diagnosis: Acute pain of right knee  Localized edema  Other abnormalities of gait and mobility  Muscle weakness (generalized)  Stiffness of right knee, not elsewhere classified     Problem List Patient Active Problem List   Diagnosis Date Noted  . Cellulitis of right knee 04/18/2016  . 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 Unilateral primary osteoarthritis, right knee   . Special screening for malignant neoplasms, colon 12/29/2014  . Diarrhea 12/29/2014  . Medication management 05/13/2013  . Does not have health insurance 05/13/2013  . Adjustment reaction with anxiety and depression 03/12/2012  . Medication monitoring encounter 03/12/2012  . URI, acute 08/13/2011  . Hemorrhoids 08/13/2011  . Perineal itching, female 08/13/2011  . LBBB (left bundle branch block) 09/24/2010  . Murmur 09/08/2010  . EKG, abnormal 08/19/2010  . Vaginitis and vulvovaginitis 08/19/2010  . Visit for preventive health examination 08/19/2010  . Rapid palpitations 08/18/2010  . Dizziness 08/18/2010  . Arthritis 08/18/2010  . ANXIETY 06/18/2008  . GRIEF REACTION 06/18/2008  . DEPRESSION, MAJOR, RECURRENT 07/06/2006  . TOBACCO DEPENDENCE 07/06/2006  . ARTHRALGIA, UNSPECIFIED 07/06/2006    Dessa Phi PTA 04/27/2016, 1:20 PM  [facility] [address] [place], [place], [address] Phone: [phone]   Fax:  [phone]  Name: Valerie Love MRN: [id number] Date of Birth: 06-Jul-1955

## 2016-04-28 MED ORDER — ALPRAZOLAM 0.5 MG PO TABS: ORAL_TABLET | ORAL | 0 refills | Status: DC

## 2016-04-28 NOTE — Telephone Encounter (Signed)
I spoke with Valerie Love and faxed scripts to [name] per Valerie Love request.

## 2016-04-28 NOTE — Telephone Encounter (Signed)
done

## 2016-04-29 ENCOUNTER — Ambulatory Visit: Payer: BLUE CROSS/BLUE SHIELD | Admitting: Physical Therapy

## 2016-04-29 ENCOUNTER — Encounter: Payer: Self-pay | Admitting: Physical Therapy

## 2016-04-29 DIAGNOSIS — R2689 Other abnormalities of gait and mobility: Secondary | ICD-10-CM

## 2016-04-29 DIAGNOSIS — M25561 Pain in right knee: Secondary | ICD-10-CM

## 2016-04-29 DIAGNOSIS — R600 Localized edema: Secondary | ICD-10-CM

## 2016-04-29 NOTE — Therapy (Signed)
[facility] [address] [place], [place], [address] Phone: [phone]   Fax:  [phone]  Physical Therapy Treatment  Patient Details  Name: Valerie Love MRN: [id number] Date of Birth: 02-Feb-1956 Referring Provider: Aldean Bakeruda, Marcus, MD  Encounter Date: 04/29/2016      PT End of Session - 04/29/16 1142    Visit Number 11   Date for PT Re-Evaluation 05/31/16   Authorization Type BCBS 23 visits   Authorization - Visit Number 8   Authorization - Number of Visits 23   PT Start Time 1017   PT Stop Time 1115   PT Time Calculation (min) 58 min   Activity Tolerance Patient tolerated treatment well   Behavior During Therapy [facility] for tasks assessed/performed      Past Medical History:  Diagnosis Date  . Anxiety   . Arthritis    R knee, back, hands   . Cellulitis of knee 04/2016   RT KNEE  . Childhood asthma   . Complication of anesthesia    woke up slowly - 3969yrs. ago  . Depression    related to husband illness and death  . History of jaundice    as a child    . Hx of varicella   . Hyperthyroidism    during pregnancy, treated & resolved post partum   . IBS (irritable bowel syndrome)   . LBBB (left bundle branch block)   . Scoliosis   . Thyroid disease in pregnancy     Past Surgical History:  Procedure Laterality Date  . BREAST BIOPSY Left 80  . BREAST BIOPSY Right 1979   benign   . Cyst removed from ovary    . KNEE SURGERY     right duda   . TONSILLECTOMY AND ADENOIDECTOMY  1073  . TOTAL KNEE ARTHROPLASTY 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   Procedure: TOTAL KNEE ARTHROPLASTY;  Surgeon: Nadara MustardMarcus V Duda, MD;  Location: [facility] OR;  Service: Orthopedics;  Laterality: Right;  . TUBAL LIGATION    . WISDOM TOOTH EXTRACTION      There were no vitals filed for this visit.      Subjective Assessment - 04/29/16 1022    Subjective Pt got good report from MD. Infection is gone and pt is now only using cane as needed.    Pertinent History Rt TKA: 03/16/16   Limitations Walking;Standing   How long can you walk comfortably? walking limited by fatigue 5-8 minutes   Patient Stated Goals improve gait, reduce knee pain, improve Rt knee A/ROM   Currently in Pain? Yes   Pain Score 1    Pain Location Knee   Pain Orientation Right   Pain Descriptors / Indicators Aching   Pain Type Surgical pain   Pain Onset 1 to 4 weeks ago   Pain Frequency Constant            [facility] PT Assessment - 04/29/16 0001      PROM   PROM Assessment Site Knee   Right/Left Knee Right   Right Knee Extension 6   Right Knee Flexion 112  supine                     [facility] Adult PT Treatment/Exercise - 04/29/16 0001      Knee/Hip Exercises: Stretches   Active Hamstring Stretch --  Ankle porpped on foam roll x2 minutes #5     Knee/Hip Exercises: Aerobic   Nustep L1 x 8 minutes  Therapist present  to discuss treatment     Knee/Hip Exercises: Machines for Strengthening   Total Gym Leg Press 70# bilateral 30x     Knee/Hip Exercises: Standing   Terminal Knee Extension Limitations Isometric with ball at wall  3 second hold x10   Forward Step Up 20 reps;Hand Hold: 2;Step Height: 6"   Other Standing Knee Exercises Ladder walking  CGA forward, side     Knee/Hip Exercises: Seated   Sit to Sand 20 reps     Modalities   Modalities Cryotherapy     Vasopneumatic   Number Minutes Vasopneumatic  15 minutes   Vasopnuematic Location  Knee   Vasopneumatic Pressure Medium   Vasopneumatic Temperature  3 snowflakes     Manual Therapy   Manual Therapy Joint mobilization;Soft tissue mobilization;Passive ROM;Muscle Energy Technique   Manual therapy comments Seated/ spine   Joint Mobilization tibiofemoral A/P, P/A glides, patellar mobs superior/inferior/medial lateral grade 3 10xeach   Soft tissue mobilization quad, HS   Passive ROM knee flexion with gentle overpressure 15x                  PT Short Term Goals -  04/25/16 1328      PT SHORT TERM GOAL #2   Title demonstrate Rt knee A/ROM extension to lacking < or = to 10 degrees to normalize gait   Time 4   Period Weeks   Status On-going     PT SHORT TERM GOAL #3   Title improve Rt LE strength to walk for 10-12 minutes without need to rest   Time 4   Period Weeks   Status On-going     PT SHORT TERM GOAL #4   Title ascend step with step-over-step gait with use of 1 rail > 50% of the time   Time 4   Period Weeks   Status On-going           PT Long Term Goals - 04/22/16 1200      PT LONG TERM GOAL #1   Title be independent in advanced HEP   Time 8   Period Weeks   Status On-going     PT LONG TERM GOAL #2   Title reduce FOTO to < or = to 48% limitation   Time 8   Period Weeks   Status On-going     PT LONG TERM GOAL #3   Title demonstrate Rt knee A/ROM flexion to > or = to 110 degrees to improve squatting and steps   Period Weeks   Status On-going     PT LONG TERM GOAL #4   Title report < or = to 2/10 Rt knee pain with standing and walking   Time 8   Period Weeks   Status On-going     PT LONG TERM GOAL #5   Title improve Rt LE strength to descend steps with step-over-step gait > or = to 50% of the time   Period Weeks   Status On-going     PT LONG TERM GOAL #6   Title demonstrate Rt knee A/ROM extension to lacking < or = to 5 degrees to normalize gait pattern   Time 8   Period Weeks   Status On-going               Plan - 04/29/16 1103    Clinical Impression Statement Pt doing very well today. Able to tolerate all exercises well. P/ROM measured in supine 112 flexion, -6 extension. Pt will continue to benefit  from skilled therapy for LE strength and balance.    Rehab Potential Good   PT Frequency 3x / week   PT Duration 8 weeks   PT Treatment/Interventions ADLs/Self Care Home Management;Cryotherapy;Electrical Stimulation;Iontophoresis 4mg /ml Dexamethasone;Moist Heat;Ultrasound;Gait training;Functional mobility  training;Stair training;Patient/family education;Neuromuscular re-education;Therapeutic activities;Therapeutic exercise;Manual techniques;Passive range of motion;Vasopneumatic Device;Taping;Scar mobilization   PT Next Visit Plan Balance, stability   Consulted and Agree with Plan of Care Patient      Patient will benefit from skilled therapeutic intervention in order to improve the following deficits and impairments:  Difficulty walking, Decreased range of motion, Abnormal gait, Decreased endurance, Pain, Increased edema, Decreased strength, Decreased scar mobility, Impaired flexibility, Decreased activity tolerance  Visit Diagnosis: Acute pain of right knee  Localized edema  Other abnormalities of gait and mobility     Problem List Patient Active Problem List   Diagnosis Date Noted  . Cellulitis of right knee 04/18/2016  . 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 Unilateral primary osteoarthritis, right knee   . Special screening for malignant neoplasms, colon 12/29/2014  . Diarrhea 12/29/2014  . Medication management 05/13/2013  . Does not have health insurance 05/13/2013  . Adjustment reaction with anxiety and depression 03/12/2012  . Medication monitoring encounter 03/12/2012  . URI, acute 08/13/2011  . Hemorrhoids 08/13/2011  . Perineal itching, female 08/13/2011  . LBBB (left bundle branch block) 09/24/2010  . Murmur 09/08/2010  . EKG, abnormal 08/19/2010  . Vaginitis and vulvovaginitis 08/19/2010  . Visit for preventive health examination 08/19/2010  . Rapid palpitations 08/18/2010  . Dizziness 08/18/2010  . Arthritis 08/18/2010  . ANXIETY 06/18/2008  . GRIEF REACTION 06/18/2008  . DEPRESSION, MAJOR, RECURRENT 07/06/2006  . TOBACCO DEPENDENCE 07/06/2006  . ARTHRALGIA, UNSPECIFIED 07/06/2006    Dessa Phi PTA 04/29/2016, 11:46 AM  [facility] [address] [place], [place],  [address] Phone: [phone]   Fax:  [phone]  Name: Valerie Love MRN: [id number] Date of Birth: [birth year]

## 2016-05-03 ENCOUNTER — Ambulatory Visit: Payer: BLUE CROSS/BLUE SHIELD | Admitting: Physical Therapy

## 2016-05-03 DIAGNOSIS — R2689 Other abnormalities of gait and mobility: Secondary | ICD-10-CM

## 2016-05-03 DIAGNOSIS — M6281 Muscle weakness (generalized): Secondary | ICD-10-CM

## 2016-05-03 DIAGNOSIS — M25561 Pain in right knee: Secondary | ICD-10-CM

## 2016-05-03 DIAGNOSIS — M25661 Stiffness of right knee, not elsewhere classified: Secondary | ICD-10-CM

## 2016-05-03 DIAGNOSIS — R600 Localized edema: Secondary | ICD-10-CM

## 2016-05-03 NOTE — Therapy (Signed)
[facility] [address], [place], [place], [address] Phone: [phone]   Fax:  [phone]  Physical Therapy Treatment  Patient Details  Name: Valerie Love MRN: [id number] Date of Birth: December 15, 1955 Referring Provider: Meridee Score, MD  Encounter Date: 05/03/2016      PT End of Session - 05/03/16 1320    Visit Number 12   Date for PT Re-Evaluation 05/31/16   Authorization Type BC[employer] 23 visits   Authorization - Number of Visits 23   PT Start Time 1228   PT Stop Time 1330   PT Time Calculation (min) 62 min   Activity Tolerance Patient tolerated treatment well      Past Medical History:  Diagnosis Date  . Anxiety   . Arthritis    R knee, back, hands   . Cellulitis of knee 04/2016   RT KNEE  . Childhood asthma   . Complication of anesthesia    woke up slowly - 7yr. ago  . Depression    related to husband illness and death  . History of jaundice    as a child    . Hx of varicella   . Hyperthyroidism    during pregnancy, treated & resolved post partum   . IBS (irritable bowel syndrome)   . LBBB (left bundle branch block)   . Scoliosis   . Thyroid disease in pregnancy     Past Surgical History:  Procedure Laterality Date  . BREAST BIOPSY Left 80  . BREAST BIOPSY Right 1979   benign   . Cyst removed from ovary    . KNEE SURGERY     right duda   . TONSILLECTOMY AND ADENOIDECTOMY  1073  . TOTAL KNEE ARTHROPLASTY 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   Procedure: TOTAL KNEE ARTHROPLASTY;  Surgeon: MNewt Minion MD;  [place] [facility]  Service: Orthopedics;  Laterality: Right;  . TUBAL LIGATION    . WISDOM TOOTH EXTRACTION      There were no vitals filed for this visit.      Subjective Assessment - 05/03/16 1233    Subjective (P)  Patient states she is doing better.  Central patellar pain/soreness.  Arrives without using her cane.  States she will have new insurance in Jan.     Currently in Pain? (P)  Yes   Pain  Location (P)  Knee   Pain Orientation (P)  Right   Pain Type (P)  Surgical pain   Pain Onset (P)  More than a month ago            [facility] - [facility]      AROM   Right Knee Extension 6   Right Knee Flexion 120                     [facility] Adult PT Treatment/Exercise - 05/03/16 0001      Knee/Hip Exercises: Aerobic   Stationary Bike Level 0 for ROM: x 8 minutes  Therapist present to discuss treatment     Knee/Hip Exercises: Machines for Strengthening   Total Gym Leg Press 70# bilateral 30x;  40# right only 20x     Knee/Hip Exercises: Standing   Forward Step Up Right;15 reps   Forward Step Up Limitations taps to chair with left    Step Down Left;1 set;10 reps;Hand Hold: 0;Step Height: 4"   Step Down Limitations using mirror feedback for patellofemoral alignment     Knee/Hip Exercises: Seated  Other Seated Knee/Hip Exercises 2# hang for extension 3 min     Vasopneumatic   Number Minutes Vasopneumatic  15 minutes   Vasopnuematic Location  Knee   Vasopneumatic Pressure Medium   Vasopneumatic Temperature  3 snowflakes     Manual Therapy   Joint Mobilization patellar mobs superior/inferior/medial lateral grade 3 10xeach   Soft tissue mobilization quad, HS   Passive ROM knee flexion with gentle overpressure 15x   Muscle Energy Technique HS contract/relax with HS soft tissue 3x5 sec                  PT Short Term Goals - 05/03/16 1325      PT SHORT TERM GOAL #1   Title be independent in initial HEP   Status Achieved     PT SHORT TERM GOAL #2   Title demonstrate Rt knee A/ROM extension to lacking < or = to 10 degrees to normalize gait   Status Achieved     PT SHORT TERM GOAL #3   Title improve Rt LE strength to walk for 10-12 minutes without need to rest   Status Achieved     PT SHORT TERM GOAL #4   Title ascend step with step-over-step gait with use of 1 rail > 50% of the time   Status Achieved           PT Long Term  Goals - 05/03/16 1326      PT LONG TERM GOAL #1   Title be independent in advanced HEP   Time 8   Period Weeks   Status On-going     PT LONG TERM GOAL #2   Title reduce FOTO to < or = to 48% limitation   Time 8   Period Weeks   Status On-going     PT LONG TERM GOAL #3   Title demonstrate Rt knee A/ROM flexion to > or = to 110 degrees to improve squatting and steps   Status Achieved     PT LONG TERM GOAL #4   Title report < or = to 2/10 Rt knee pain with standing and walking   Time 8   Period Weeks   Status On-going     PT LONG TERM GOAL #5   Title improve Rt LE strength to descend steps with step-over-step gait > or = to 50% of the time   Time 8   Period Weeks   Status On-going     PT LONG TERM GOAL #6   Title demonstrate Rt knee A/ROM extension to lacking < or = to 5 degrees to normalize gait pattern   Time 8   Period Weeks   Status On-going               Plan - 05/03/16 1320    Clinical Impression Statement The patient continues to improve with knee flexion ROM.  Continues to lack full knee extension secondary to decreased muscle length, swelling and endrange quad strength.  All short term goals met.  The patient states she has not returned to driving yet.  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  Therapist providing verbal cues for patellofemoral alignment and extensive discussion on home and a possible future gym program.     PT Next Visit Plan quad strengthening;  progressive knee ROM;  further discussion of HEP progression      Patient will benefit from skilled  therapeutic intervention in order to improve the following deficits and impairments:     Visit Diagnosis: Acute pain of right knee  Localized edema  Other abnormalities of gait and mobility  Muscle weakness (generalized)  Stiffness of right knee, not elsewhere classified     Problem List Patient Active  Problem List   Diagnosis Date Noted  . Cellulitis of right knee 04/18/2016  . 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 Unilateral primary osteoarthritis, right knee   . Special screening for malignant neoplasms, colon 12/29/2014  . Diarrhea 12/29/2014  . Medication management 05/13/2013  . Does not have health insurance 05/13/2013  . Adjustment reaction with anxiety and depression 03/12/2012  . Medication monitoring encounter 03/12/2012  . URI, acute 08/13/2011  . Hemorrhoids 08/13/2011  . Perineal itching, female 08/13/2011  . LBBB (left bundle branch block) 09/24/2010  . Murmur 09/08/2010  . EKG, abnormal 08/19/2010  . Vaginitis and vulvovaginitis 08/19/2010  . Visit for preventive health examination 08/19/2010  . Rapid palpitations 08/18/2010  . Dizziness 08/18/2010  . Arthritis 08/18/2010  . ANXIETY 06/18/2008  . GRIEF REACTION 06/18/2008  . DEPRESSION, MAJOR, RECURRENT 07/06/2006  . TOBACCO DEPENDENCE 07/06/2006  . ARTHRALGIA, UNSPECIFIED 07/06/2006   Ruben Im, PT 05/03/16 1:27 PM Phone: [phone] Fax: [phone] Alvera Singh 05/03/2016, 1:27 PM  [facility] [address] [place], [place], [address] Phone: [phone]   Fax:  [phone]  Name: SHAKENA CALLARI MRN: [id number] Date of Birth: 1955-11-15

## 2016-05-05 ENCOUNTER — Ambulatory Visit: Payer: BLUE CROSS/BLUE SHIELD | Admitting: Physical Therapy

## 2016-05-05 DIAGNOSIS — M25661 Stiffness of right knee, not elsewhere classified: Secondary | ICD-10-CM

## 2016-05-05 DIAGNOSIS — R600 Localized edema: Secondary | ICD-10-CM

## 2016-05-05 DIAGNOSIS — M25561 Pain in right knee: Secondary | ICD-10-CM | POA: Diagnosis not present

## 2016-05-05 DIAGNOSIS — R2689 Other abnormalities of gait and mobility: Secondary | ICD-10-CM

## 2016-05-05 DIAGNOSIS — M6281 Muscle weakness (generalized): Secondary | ICD-10-CM

## 2016-05-05 NOTE — Therapy (Signed)
[facility] [address], [address] [place], [place], [address] Phone: [phone]   Fax:  [phone]  Physical Therapy Treatment  Patient Details  Name: Valerie KiltsJudy T Longenecker MRN: [id number] Date of Birth: May 20, 1955 Referring Provider: Aldean Bakeruda, Marcus, MD  Encounter Date: 05/05/2016      PT End of Session - 05/05/16 1315    Visit Number 13   Date for PT Re-Evaluation 05/31/16   Authorization Type BCBS 23 visits   Authorization - Number of Visits 23   PT Start Time 1230   PT Stop Time 1330   PT Time Calculation (min) 60 min   Activity Tolerance Patient tolerated treatment well      Past Medical History:  Diagnosis Date  . Anxiety   . Arthritis    R knee, back, hands   . Cellulitis of knee 04/2016   RT KNEE  . Childhood asthma   . Complication of anesthesia    woke up slowly - 4728yrs. ago  . Depression    related to husband illness and death  . History of jaundice    as a child    . Hx of varicella   . Hyperthyroidism    during pregnancy, treated & resolved post partum   . IBS (irritable bowel syndrome)   . LBBB (left bundle branch block)   . Scoliosis   . Thyroid disease in pregnancy     Past Surgical History:  Procedure Laterality Date  . BREAST BIOPSY Left 80  . BREAST BIOPSY Right 1979   benign   . Cyst removed from ovary    . KNEE SURGERY     right duda   . TONSILLECTOMY AND ADENOIDECTOMY  1073  . TOTAL KNEE ARTHROPLASTY 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   Procedure: TOTAL KNEE ARTHROPLASTY;  Surgeon: Nadara MustardMarcus V Duda, MD;  [facility];  Service: Orthopedics;  Laterality: Right;  . TUBAL LIGATION    . WISDOM TOOTH EXTRACTION      There were no vitals filed for this visit.      Subjective Assessment - 05/05/16 1233    Subjective Patient drove herself to PT today.  Requests discharge from PT tomorrow secondary to insurance change and high deductible in the new year.     Currently in Pain? Yes   Pain Score 3    Pain  Location Knee   Pain Orientation Right   Pain Type Surgical pain   Pain Onset More than a month ago                         [facility] Treatment/Exercise - 05/05/16 0001      Knee/Hip Exercises: Aerobic   Stationary Bike Level 0 for ROM: x 8 minutes  Therapist present to discuss treatment     Knee/Hip Exercises: Machines for Strengthening   Cybex Knee Extension 5# 2x10   Cybex Knee Flexion 15# 3x10 right only   Total Gym Leg Press 75# bilateral 30x;  45# right only 20x     Knee/Hip Exercises: Seated   Sit to Sand 20 reps     Vasopneumatic   Number Minutes Vasopneumatic  15 minutes   Vasopnuematic Location  Knee   Vasopneumatic Pressure Medium   Vasopneumatic Temperature  3 snowflakes     Manual Therapy   Joint Mobilization patellar mobs superior/inferior/medial lateral grade 3 10xeach   Soft tissue mobilization quad, HS   Passive ROM knee flexion with gentle overpressure 15x  Muscle Energy Technique HS contract/relax with HS soft tissue 3x5 sec                  PT Short Term Goals - 05/05/16 1329      PT SHORT TERM GOAL #1   Title be independent in initial HEP   Status Achieved     PT SHORT TERM GOAL #2   Title demonstrate Rt knee A/ROM extension to lacking < or = to 10 degrees to normalize gait   Status Achieved     PT SHORT TERM GOAL #3   Title improve Rt LE strength to walk for 10-12 minutes without need to rest   Status Achieved     PT SHORT TERM GOAL #4   Title ascend step with step-over-step gait with use of 1 rail > 50% of the time   Status Achieved           PT Long Term Goals - 05/05/16 1330      PT LONG TERM GOAL #1   Title be independent in advanced HEP   Time 8   Period Weeks   Status On-going     PT LONG TERM GOAL #2   Title reduce FOTO to < or = to 48% limitation   Time 8   Period Weeks   Status On-going     PT LONG TERM GOAL #3   Title demonstrate Rt knee A/ROM flexion to > or = to 110 degrees to  improve squatting and steps   Status Achieved     PT LONG TERM GOAL #4   Title report < or = to 2/10 Rt knee pain with standing and walking   Time 8   Period Weeks   Status On-going     PT LONG TERM GOAL #5   Title improve Rt LE strength to descend steps with step-over-step gait > or = to 50% of the time   Time 8   Period Weeks   Status On-going     PT LONG TERM GOAL #6   Title demonstrate Rt knee A/ROM extension to lacking < or = to 5 degrees to normalize gait pattern   Time 8   Period Weeks   Status On-going               Plan - 05/05/16 1316    Clinical Impression Statement The patient continues to improve with knee ROM and strength.  She expresses interest in joining a gym upon discharge from PT; therefore,  we discussed appropriate ex equipment and appropriate weights/settings and how to progress.  Decreasing swelling with mild peri-patellar edema only.  Therapist closely monitoring response with all interventions.     PT Next Visit Plan discharge next visit;  FOTO; check ROM/MMT; stairs      Patient will benefit from skilled therapeutic intervention in order to improve the following deficits and impairments:     Visit Diagnosis: Acute pain of right knee  Localized edema  Other abnormalities of gait and mobility  Muscle weakness (generalized)  Stiffness of right knee, not elsewhere classified     Problem List Patient Active Problem List   Diagnosis Date Noted  . Cellulitis of right knee 04/18/2016  . 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 Unilateral primary osteoarthritis, right knee   . Special screening for malignant neoplasms, colon 12/29/2014  . Diarrhea 12/29/2014  . Medication management 05/13/2013  . Does not have health insurance 05/13/2013  . Adjustment reaction with anxiety and  depression 03/12/2012  . Medication monitoring encounter 03/12/2012  . URI, acute 08/13/2011  . Hemorrhoids 08/13/2011  . Perineal itching, female  08/13/2011  . LBBB (left bundle branch block) 09/24/2010  . Murmur 09/08/2010  . EKG, abnormal 08/19/2010  . Vaginitis and vulvovaginitis 08/19/2010  . Visit for preventive health examination 08/19/2010  . Rapid palpitations 08/18/2010  . Dizziness 08/18/2010  . Arthritis 08/18/2010  . ANXIETY 06/18/2008  . GRIEF REACTION 06/18/2008  . DEPRESSION, MAJOR, RECURRENT 07/06/2006  . TOBACCO DEPENDENCE 07/06/2006  . ARTHRALGIA, UNSPECIFIED 07/06/2006   Lavinia Sharps, PT 05/05/16 1:32 PM Phone: [phone] Fax: [phone]  Vivien Presto 05/05/2016, 1:31 PM  [facility] [address], [address] [place], [place], [address] Phone: [phone]   Fax:  [phone]  Name: DEMIANA CRUMBLEY MRN: [id number] Date of Birth: 11/05/1955

## 2016-05-06 ENCOUNTER — Ambulatory Visit: Payer: BLUE CROSS/BLUE SHIELD | Admitting: Physical Therapy

## 2016-05-06 DIAGNOSIS — R2689 Other abnormalities of gait and mobility: Secondary | ICD-10-CM

## 2016-05-06 DIAGNOSIS — M25561 Pain in right knee: Secondary | ICD-10-CM | POA: Diagnosis not present

## 2016-05-06 DIAGNOSIS — M25661 Stiffness of right knee, not elsewhere classified: Secondary | ICD-10-CM

## 2016-05-06 DIAGNOSIS — R600 Localized edema: Secondary | ICD-10-CM

## 2016-05-06 DIAGNOSIS — M6281 Muscle weakness (generalized): Secondary | ICD-10-CM

## 2016-05-06 NOTE — Patient Instructions (Signed)
  Go through ROM every day:  Bending and straightening the knee   For strengthening:  Use band,  free weights for resistance, body weight (step exercises, sit to stand, lunges)   Manage swelling:  It WILL swell when when you are up on your feet by the end of the day.  Elevate above heart level and ice as need.   Compression socks can be helpful for ankle swelling.      Lavinia Sharps PT  [facility] [address], [address] [place], [place] [address] Phone # [phone] Fax [phone]

## 2016-05-06 NOTE — Therapy (Addendum)
[facility] [address], [place], [place], [address] Phone: [phone]   Fax:  [phone]  Physical Therapy Treatment/Discharge Summary  Patient Details  Name: Valerie Love MRN: [id number] Date of Birth: 09/17/1955 Referring Provider: Meridee Score, MD  Encounter Date: 05/06/2016      PT End of Session - 05/06/16 1149    Visit Number 14   Date for PT Re-Evaluation 05/31/16   Authorization Type BCBS 23 visits   PT Start Time 1100   PT Stop Time 1205   PT Time Calculation (min) 65 min   Activity Tolerance Patient tolerated treatment well      Past Medical History:  Diagnosis Date  . Anxiety   . Arthritis    R knee, back, hands   . Cellulitis of knee 04/2016   RT KNEE  . Childhood asthma   . Complication of anesthesia    woke up slowly - 70yr. ago  . Depression    related to husband illness and death  . History of jaundice    as a child    . Hx of varicella   . Hyperthyroidism    during pregnancy, treated & resolved post partum   . IBS (irritable bowel syndrome)   . LBBB (left bundle branch block)   . Scoliosis   . Thyroid disease in pregnancy     Past Surgical History:  Procedure Laterality Date  . BREAST BIOPSY Left 80  . BREAST BIOPSY Right 1979   benign   . Cyst removed from ovary    . KNEE SURGERY     right duda   . TONSILLECTOMY AND ADENOIDECTOMY  1073  . TOTAL KNEE ARTHROPLASTY 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   Procedure: TOTAL KNEE ARTHROPLASTY;  Surgeon: MNewt Minion MD;  [place] [facility]  Service: Orthopedics;  Laterality: Right;  . TUBAL LIGATION    . WISDOM TOOTH EXTRACTION      There were no vitals filed for this visit.      Subjective Assessment - 05/06/16 1103    Subjective No complaints.  Drove herself to therapy again today.  A little pain right on the kneecap.  I'm going to go check out gyms on Tuesday.  I'm doing better now than I was before surgery!   Currently in Pain? Yes    Pain Score 2    Pain Location Knee   Pain Orientation Right   Pain Type Surgical pain   Pain Onset More than a month ago            [facility]      Observation/Other Assessments   Focus on Therapeutic Outcomes (FOTO)  42% limitation      Observation/Other Assessments-Edema    Edema --  mid patellar girth 3cm greater on right     AROM   Right Knee Extension 5   Right Knee Flexion 124     Strength   Right Knee Flexion 4/5   Right Knee Extension 4/5                     [facility] Adult PT Treatment/Exercise - 05/06/16 0001      Knee/Hip Exercises: Aerobic   Stationary Bike Level 0 for ROM: x 8 minutes  Therapist present to discuss treatment       Long discussion of HEP, gym progression.   Vasocompression right knee coldest setting, 15 min, moderate compression to decrease edema.  PT Education - 05/06/16 1149    Education provided Yes   Education Details Progression of HEP;  self care   Person(s) Educated Patient   Methods Explanation;Demonstration;Handout   Comprehension Verbalized understanding;Returned demonstration          PT Short Term Goals - 05/06/16 1109      PT SHORT TERM GOAL #1   Title be independent in initial HEP   Status Achieved     PT SHORT TERM GOAL #2   Title demonstrate Rt knee A/ROM extension to lacking < or = to 10 degrees to normalize gait   Status Achieved     PT SHORT TERM GOAL #3   Title improve Rt LE strength to walk for 10-12 minutes without need to rest   Status Achieved     PT SHORT TERM GOAL #4   Title ascend step with step-over-step gait with use of 1 rail > 50% of the time   Status Achieved           PT Long Term Goals - 05/06/16 1110      PT LONG TERM GOAL #1   Title be independent in advanced HEP   Status Achieved     PT LONG TERM GOAL #2   Title reduce FOTO to < or = to 48% limitation   Status Achieved     PT LONG TERM GOAL #3   Title demonstrate Rt  knee A/ROM flexion to > or = to 110 degrees to improve squatting and steps   Status Achieved     PT LONG TERM GOAL #4   Title report < or = to 2/10 Rt knee pain with standing and walking   Status Achieved     PT LONG TERM GOAL #5   Title improve Rt LE strength to descend steps with step-over-step gait > or = to 50% of the time   Status Achieved     PT LONG TERM GOAL #6   Title demonstrate Rt knee A/ROM extension to lacking < or = to 5 degrees to normalize gait pattern   Status Achieved               Plan - 05/06/16 1139    Clinical Impression Statement The patient has made excellent progress with rehab following TKR.  Her active ROM of her right knee 5-124 degrees.  Her quad and HS strength is 4/5.  She is able to drive and walk medium community distances without an assistive device.  She is able to ascend and descend steps with very minimal to no UE support on railings.  She does have continued peri-patellar swelling with 3cm difference between right and left.  We have discussed edema control strategies in addition to a safe, self progression of a HEP and recommendations for a gym program.  She has met all rehab goals and will therefore discharge patient from PT at this time.        Patient will benefit from skilled therapeutic intervention in order to improve the following deficits and impairments:     Visit Diagnosis: Acute pain of right knee  Localized edema  Other abnormalities of gait and mobility  Muscle weakness (generalized)  Stiffness of right knee, not elsewhere classified  PHYSICAL THERAPY DISCHARGE SUMMARY  Visits from Start of Care: 14  Current functional level related to goals / functional outcomes: See clinical impression above    Remaining deficits: As above   Education / Equipment: HEP Plan: Patient agrees to discharge.  Patient goals were met. Patient is being discharged due to meeting the stated rehab goals.  ?????       Problem  List Patient Active Problem List   Diagnosis Date Noted  . Cellulitis of right knee 04/18/2016  . 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 Unilateral primary osteoarthritis, right knee   . Special screening for malignant neoplasms, colon 12/29/2014  . Diarrhea 12/29/2014  . Medication management 05/13/2013  . Does not have health insurance 05/13/2013  . Adjustment reaction with anxiety and depression 03/12/2012  . Medication monitoring encounter 03/12/2012  . URI, acute 08/13/2011  . Hemorrhoids 08/13/2011  . Perineal itching, female 08/13/2011  . LBBB (left bundle branch block) 09/24/2010  . Murmur 09/08/2010  . EKG, abnormal 08/19/2010  . Vaginitis and vulvovaginitis 08/19/2010  . Visit for preventive health examination 08/19/2010  . Rapid palpitations 08/18/2010  . Dizziness 08/18/2010  . Arthritis 08/18/2010  . ANXIETY 06/18/2008  . GRIEF REACTION 06/18/2008  . DEPRESSION, MAJOR, RECURRENT 07/06/2006  . TOBACCO DEPENDENCE 07/06/2006  . ARTHRALGIA, UNSPECIFIED 07/06/2006   Ruben Im, PT 05/06/16 12:00 PM Phone: [phone] Fax: [phone]  Alvera Singh 05/06/2016, 11:56 AM  [facility] [address], [place], [place], [address] Phone: [phone]   Fax:  [phone]  Name: Valerie Love MRN: [id number] Date of Birth: [birth year]

## 2016-05-13 ENCOUNTER — Ambulatory Visit (INDEPENDENT_AMBULATORY_CARE_PROVIDER_SITE_OTHER): Payer: BLUE CROSS/BLUE SHIELD | Admitting: Orthopedic Surgery

## 2016-05-13 VITALS — Ht 61.0 in | Wt 158.0 lb

## 2016-05-13 DIAGNOSIS — M1711 Unilateral primary osteoarthritis, right knee: Secondary | ICD-10-CM

## 2016-05-13 NOTE — Progress Notes (Signed)
Office Visit Note   Patient: Valerie Love           Date of Birth: [birth year]           MRN: [id number] Visit Date: 05/13/2016              Requested by: Madelin Headings, MD [address] [place], [place] [address] PCP: Lorretta Harp, MD  Chief Complaint  Patient presents with  . Right Knee - Routine Post Op    03/16/16 right total knee replacement with history of infection    HPI: Patient has 8 days left of ABX. Finished physical therapy last week and is doing HEP  And works outs at [employer] and she does not have any questions or concerns and does not need any refills on medication. Rodena Medin, RMA    Assessment & Plan: Visit Diagnoses: No diagnosis found.  Plan: Follow-up as needed. Discussed that she does not need antibiotic prophylaxis for dental work. Complete her course of antibiotics. She will advance to therapy at a gym.  Follow-Up Instructions: No Follow-up on file.   Ortho Exam Examination patient has excellent range of motion of her knee from 4-124. There is no redness no cellulitis no pain to palpation no pain with range of motion of the knee. The incision is completely healed.  Imaging: No results found.  Orders:  No orders of the defined types were placed in this encounter.  No orders of the defined types were placed in this encounter.    Procedures: No procedures performed  Clinical Data: No additional findings.  Subjective: Review of Systems  Objective: Vital Signs: Ht 5\' 1"  (1.549 m)   Wt 158 lb (71.7 kg)   BMI 29.85 kg/m   Specialty Comments:  No specialty comments available.  PMFS History: Patient Active Problem List   Diagnosis Date Noted  . Cellulitis of right knee 04/18/2016  . 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 Unilateral primary osteoarthritis, right knee   . Special screening for malignant neoplasms, colon 12/29/2014  . Diarrhea 12/29/2014  . Medication management 05/13/2013  . Does not have  health insurance 05/13/2013  . Adjustment reaction with anxiety and depression 03/12/2012  . Medication monitoring encounter 03/12/2012  . URI, acute 08/13/2011  . Hemorrhoids 08/13/2011  . Perineal itching, female 08/13/2011  . LBBB (left bundle branch block) 09/24/2010  . Murmur 09/08/2010  . EKG, abnormal 08/19/2010  . Vaginitis and vulvovaginitis 08/19/2010  . Visit for preventive health examination 08/19/2010  . Rapid palpitations 08/18/2010  . Dizziness 08/18/2010  . Arthritis 08/18/2010  . ANXIETY 06/18/2008  . GRIEF REACTION 06/18/2008  . DEPRESSION, MAJOR, RECURRENT 07/06/2006  . TOBACCO DEPENDENCE 07/06/2006  . ARTHRALGIA, UNSPECIFIED 07/06/2006   Past Medical History:  Diagnosis Date  . Anxiety   . Arthritis    R knee, back, hands   . Cellulitis of knee 04/2016   RT KNEE  . Childhood asthma   . Complication of anesthesia    woke up slowly - 65yrs. ago  . Depression    related to husband illness and death  . History of jaundice    as a child    . Hx of varicella   . Hyperthyroidism    during pregnancy, treated & resolved post partum   . IBS (irritable bowel syndrome)   . LBBB (left bundle branch block)   . Scoliosis   . Thyroid disease in pregnancy     Family History  Problem Relation Age of Onset  . Alcohol abuse Mother     died from stomach ulcers  . Stroke Father     died  from cva and mi  . Hypertension Father   . Hyperlipidemia Father   . Coronary artery disease Father     Age 65  . Colon polyps Sister   . Hepatitis C Brother   . Colon cancer Neg Hx     Past Surgical History:  Procedure Laterality Date  . BREAST BIOPSY Left 80  . BREAST BIOPSY Right 1979   benign   . Cyst removed from ovary    . KNEE SURGERY     right Masud Holub   . TONSILLECTOMY AND ADENOIDECTOMY  1073  . TOTAL KNEE ARTHROPLASTY 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   Procedure: TOTAL KNEE ARTHROPLASTY;  Surgeon: [name], MD;  Location: [facility] OR;  Service: Orthopedics;  Laterality: Right;  .  TUBAL LIGATION    . WISDOM TOOTH EXTRACTION     Social History   Occupational History  . self     Catering and cleaning   Social History Main Topics  . Smoking status: Former Smoker    Packs/day: 0.25    Years: 40.00    Types: Cigarettes    Quit date: 01/06/2016  . Smokeless tobacco: Never Used     Comment: none in about 1 week  . Alcohol use 0.0 oz/week     Comment: socially-wine- daily  . Drug use: No  . Sexual activity: Not on file

## 2016-05-25 ENCOUNTER — Other Ambulatory Visit: Payer: Self-pay | Admitting: Family Medicine

## 2016-05-31 ENCOUNTER — Telehealth: Payer: Self-pay | Admitting: Family Medicine

## 2016-05-31 ENCOUNTER — Other Ambulatory Visit: Payer: Self-pay | Admitting: Family Medicine

## 2016-05-31 DIAGNOSIS — Z0000 Encounter for general adult medical examination without abnormal findings: Secondary | ICD-10-CM

## 2016-05-31 NOTE — Telephone Encounter (Signed)
Sent to the pharmacy by e-scribe for 2 months.  Message sent to scheduling. 

## 2016-05-31 NOTE — Telephone Encounter (Signed)
Pt is past due for cpx and lab work.  Dr. Demetrius Charity would like to see her for those appointments within 2 months.  I have placed the lab orders.  Please help the pt to get scheduled.  Thanks!!

## 2016-05-31 NOTE — Telephone Encounter (Signed)
pshe is due for yearly  Visit  Please have her arrange if she is better enough from her surgery .  Refill x 2 months  in the interim

## 2016-06-01 NOTE — Telephone Encounter (Signed)
Pt states she is going to have a cpx with her OBGYN in March,(with PAP) and doesn't not want her insurance to get messed up by that.  Pt would like this called a well visit. Pt has been scheduled labs prior appt.

## 2016-06-01 NOTE — Telephone Encounter (Signed)
lmom for pt to call back

## 2016-06-01 NOTE — Telephone Encounter (Signed)
Ok to do just a regular yearly OV fro medication   If her gyne is doing preventive . No need to do lab   Before visit

## 2016-07-05 ENCOUNTER — Other Ambulatory Visit (INDEPENDENT_AMBULATORY_CARE_PROVIDER_SITE_OTHER): Payer: BLUE CROSS/BLUE SHIELD

## 2016-07-05 DIAGNOSIS — Z0000 Encounter for general adult medical examination without abnormal findings: Secondary | ICD-10-CM

## 2016-07-05 LAB — BASIC METABOLIC PANEL
BUN: 19 mg/dL (ref 6–23)
CHLORIDE: 106 meq/L (ref 96–112)
CO2: 28 mEq/L (ref 19–32)
Calcium: 9.2 mg/dL (ref 8.4–10.5)
Creatinine, Ser: 0.71 mg/dL (ref 0.40–1.20)
GFR: 88.97 mL/min (ref >60.00)
Glucose, Bld: 93 mg/dL (ref 70–99)
POTASSIUM: 4.5 meq/L (ref 3.5–5.1)
Sodium: 142 mEq/L (ref 135–145)

## 2016-07-05 LAB — CBC WITH DIFFERENTIAL/PLATELET
BASOS PCT: 0.5 % (ref 0.0–3.0)
Basophils Absolute: 0 10*3/uL (ref 0.0–0.1)
EOS PCT: 2.7 % (ref 0.0–5.0)
Eosinophils Absolute: 0.1 10*3/uL (ref 0.0–0.7)
HEMATOCRIT: 39.8 % (ref 36.0–46.0)
HEMOGLOBIN: 13.4 g/dL (ref 12.0–15.0)
Lymphocytes Relative: 33.8 % (ref 12.0–46.0)
Lymphs Abs: 1.6 10*3/uL (ref 0.7–4.0)
MCHC: 33.5 g/dL (ref 30.0–36.0)
MCV: 87 fl (ref 78.0–100.0)
MONO ABS: 0.3 10*3/uL (ref 0.1–1.0)
MONOS PCT: 7.4 % (ref 3.0–12.0)
Neutro Abs: 2.6 10*3/uL (ref 1.4–7.7)
Neutrophils Relative %: 55.6 % (ref 43.0–77.0)
Platelets: 300 10*3/uL (ref 150.0–400.0)
RBC: 4.58 Mil/uL (ref 3.87–5.11)
RDW: 15.2 % (ref 11.5–15.5)
WBC: 4.6 10*3/uL (ref 4.0–10.5)

## 2016-07-05 LAB — TSH: TSH: 1.53 u[IU]/mL (ref 0.35–4.50)

## 2016-07-05 LAB — HEPATIC FUNCTION PANEL
ALBUMIN: 4.1 g/dL (ref 3.5–5.2)
ALT: 14 U/L (ref 0–35)
AST: 16 U/L (ref 0–37)
Alkaline Phosphatase: 59 U/L (ref 39–117)
BILIRUBIN TOTAL: 0.4 mg/dL (ref 0.2–1.2)
Bilirubin, Direct: 0.1 mg/dL (ref 0.0–0.3)
Total Protein: 6.3 g/dL (ref 6.0–8.3)

## 2016-07-05 LAB — LIPID PANEL
CHOL/HDL RATIO: 3
CHOLESTEROL: 210 mg/dL — AB (ref 0–200)
HDL: 70.9 mg/dL (ref >39.00)
LDL CALC: 115 mg/dL — AB (ref 0–99)
NonHDL: 138.94
Triglycerides: 122 mg/dL (ref 0.0–149.0)
VLDL: 24.4 mg/dL (ref 0.0–40.0)

## 2016-07-12 NOTE — Progress Notes (Signed)
Chief Complaint  Patient presents with  . Pt is only wanting to discuss blood work and medication    HPI: Patient  Valerie Love  61 y.o. comes in today for yearly  visit . And med check  Getting her PV at her gyne this year .  Since last visit she has done ok had knee surgery and  Secondary staph infection now better and getting back ot exercise.     Dr Sharol Given    She is on citalopram and now "in a good place" w depression anxiety .  Prefers to stay on med  Has stopped tobacco in October  Hard but glad she did .     Use of laprazolam   5-6 doses a month or so   If needed.   Wants to reviewe lab work done  Risk of hep c   Husband died of hep c and hse was tested at that time and was  Negative and no other exposures   HH of 2 -4 grandkids 12  And 9 at times  No pets  etoh 1 per day or less  20  - 25 hr work per week.  Health Maintenance  Topic Date Due  . Hepatitis C Screening  [birth year]  . HIV Screening  08/04/1970  . MAMMOGRAM  10/28/2016  . PAP SMEAR  10/28/2017  . TETANUS/TDAP  08/17/2020  . COLONOSCOPY  01/05/2025  . INFLUENZA VACCINE  Completed     ROS:  GEN/ HEENT: No fever, significant weight changes sweats headaches vision problems hearing changes, CV/ PULM; No chest pain shortness of breath cough, syncope,edema  change in exercise tolerance. GI /GU: No adominal pain, vomiting, change in bowel habits. No blood in the stool. No significant GU symptoms. SKIN/HEME: ,no acute skin rashes suspicious lesions or bleeding. No lymphadenopathy, nodules, masses.  NEURO/ PSYCH:  No neurologic signs such as weakness numbness. No depression anxiety. IMM/ Allergy: No unusual infections.  Allergy .   REST of 12 system review negative except as per HPI   Past Medical History:  Diagnosis Date  . Anxiety   . Arthritis    R knee, back, hands   . Cellulitis of knee 04/2016   RT KNEE  . Childhood asthma   . Complication of anesthesia    woke up slowly - 56yr. ago  .  Depression    related to husband illness and death  . History of jaundice    as a child    . Hx of varicella   . Hyperthyroidism    during pregnancy, treated & resolved post partum   . IBS (irritable bowel syndrome)   . LBBB (left bundle branch block)   . Scoliosis   . Scoliosis 07/2015  . Thyroid disease in pregnancy     Past Surgical History:  Procedure Laterality Date  . BREAST BIOPSY Left 80  . BREAST BIOPSY Right 1979   benign   . Cyst removed from ovary    . KNEE SURGERY     right duda   . TONSILLECTOMY AND ADENOIDECTOMY  1073  . TOTAL KNEE ARTHROPLASTY 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   Procedure: TOTAL KNEE ARTHROPLASTY;  Surgeon: MNewt Minion MD;  Location: [facility]  Service: Orthopedics;  Laterality: Right;  . TUBAL LIGATION    . WISDOM TOOTH EXTRACTION      Family History  Problem Relation Age of Onset  . Alcohol abuse Mother     died from stomach ulcers  . Stroke Father  died  from cva and mi  . Hypertension Father   . Hyperlipidemia Father   . Coronary artery disease Father     Age 82  . Colon polyps Sister   . Hepatitis C Brother   . Colon cancer Neg Hx     Social History   Social History  . Marital status: Widowed    Spouse name: N/A  . Number of children: 3  . Years of education: N/A   Occupational History  . self     Catering and cleaning   Social History Main Topics  . Smoking status: Former Smoker    Packs/day: 0.25    Years: 40.00    Types: Cigarettes    Quit date: 01/06/2016  . Smokeless tobacco: Never Used     Comment: none in about 1 week  . Alcohol use 0.0 oz/week     Comment: socially-wine- daily  . Drug use: No  . Sexual activity: Not Asked   Other Topics Concern  . None   Social History Narrative   Husband passed away on 2008/03/06 hep c and cirrhosis.   She is hep c negative 2006   G3P3   HH of 2-3  son no pets    No falls .  Has smoke detector and wears seat belts. POsitive  firearms. No excess sun exposure.    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   Had time of nono insuranced and cannot affort 800 per month for catastrophic    Stopped tobacco fall 2017 . ocass etoh.  No rec drugs.   Daughter patient in our practice   [name]       EXAM:  BP 124/70 (BP Location: Left Arm, Patient Position: Sitting, Cuff Size: Normal)   Pulse 70   Temp 98.4 F (36.9 C) (Oral)   Ht '5\' 1"'$  (1.549 m)   Wt 158 lb 12.8 oz (72 kg)   BMI 30.00 kg/m   Body mass index is 30 kg/m. Wt Readings from Last 3 Encounters:  07/13/16 158 lb 12.8 oz (72 kg)  05/13/16 158 lb (71.7 kg)  04/14/16 158 lb (71.7 kg)    Physical Exam: Vital signs reviewed [id number] is a well-developed well-nourished alert cooperative    who appearsr stated age in no acute distress.  HEENT: normocephalic atraumatic , Eyes: PERRL EOM's full, conjunctiva clear, Nares: paten,t no deformity discharge or tenderness., Ears: no deformity EAC's clear TMs with normal landmarks. Mouth: clear OP, no lesions, edema.  Moist mucous membranes. Dentition in adequate repair. NECK: supple without masses, thyromegaly or bruits. CHEST/PULM:  Clear to auscultation and percussion breath sounds equal no wheeze , rales or rhonchi. No chest wall deformities or tenderness.  CV: PMI is nondisplaced, S1 S2 no gallops, murmurs, rubs. Peripheral pulses are full without delay.No JVD .  ABDOMEN: Bowel sounds normal nontender  No guard or rebound, no hepato splenomegal no CVA tenderness.   Extremtities:  No clubbing cyanosis or edema, no acute joint swelling or redness no focal atrophy NEURO:  Oriented x3, cranial nerves 3-12 appear to be intact, no obvious focal weakness,gait within normal limits no abnormal reflexes or asymmetrical SKIN: No acute rashes normal turgor, color, no bruising or petechiae. PSYCH: Oriented, good eye contact, no obvious depression anxiety, cognition and judgment appear normal. LN: no cervicaladenopathy  Lab Results  Component Value  Date   WBC 4.6 07/05/2016   HGB 13.4 07/05/2016   HCT 39.8 07/05/2016  PLT 300.0 07/05/2016   GLUCOSE 93 07/05/2016   CHOL 210 (H) 07/05/2016   TRIG 122.0 07/05/2016   HDL 70.90 07/05/2016   LDLCALC 115 (H) 07/05/2016   ALT 14 07/05/2016   AST 16 07/05/2016   NA 142 07/05/2016   K 4.5 07/05/2016   CL 106 07/05/2016   CREATININE 0.71 07/05/2016   BUN 19 07/05/2016   CO2 28 07/05/2016   TSH 1.53 07/05/2016    BP Readings from Last 3 Encounters:  07/13/16 124/70  04/21/16 124/74  03/18/16 113/63   Wt Readings from Last 3 Encounters:  07/13/16 158 lb 12.8 oz (72 kg)  05/13/16 158 lb (71.7 kg)  04/14/16 158 lb (71.7 kg)    Lab results reviewed with patient   ASSESSMENT AND PLAN:  Discussed the following assessment and plan:  Adjustment reaction with anxiety and depression  Medication management  Hyperlipidemia, unspecified hyperlipidemia type - favorable hdl and now has stopped tobacco   Hx of tobacco use, presenting hazards to health - counseled to stay tobacco free reviewed preventive parameters and med eval and lab reviewed . congrats on tobacco free situation    Benefit more than risk of citalopram    Caution use of alprazolam and disc    Will refilll.   Has no cv sx as per hx  And  bp at goal Total visit 57mns > 50% spent counseling and coordinating care as indicated in above note and in instructions to patient .  Plan yearly visit   Patient Care Team: WBurnis Medin MD as PCP - General (Internal Medicine) BLelon Perla MD (Cardiology) MLinda Hedges DO as Attending Physician (Obstetrics and Gynecology) Patient Instructions  Continue  Citalopram  Continue tobacco free    Alprazolam with caution as needed . Contact [place]  And when need refill  ] Healthy lifestyle includes : At least 150 minutes of exercise weeks  , weight at healthy levels, which is usually   BMI 19-25. Avoid trans fats and processed foods;  Increase fresh fruits and veges to 5  servings per day. And avoid sweet beverages including tea and juice. Mediterranean diet with olive oil and nuts have been noted to be heart and brain healthy . Avoid tobacco products . Limit  alcohol to  7 per week for women and 14 servings for men.  Get adequate sleep . Wear seat belts . Don't text and drive .   Health Maintenance, Female Adopting a healthy lifestyle and getting preventive care can go a long way to promote health and wellness. Talk with your health care provider about what schedule of regular examinations is right for you. This is a good chance for you to check in with your provider about disease prevention and staying healthy. In between checkups, there are plenty of things you can do on your own. Experts have done a lot of research about which lifestyle changes and preventive measures are most likely to keep you healthy. Ask your health care provider for more information. Weight and diet Eat a healthy diet  Be sure to include plenty of vegetables, fruits, low-fat dairy products, and lean protein.  Do not eat a lot of foods high in solid fats, added sugars, or salt.  Get regular exercise. This is one of the most important things you can do for your health.  Most adults should exercise for at least 150 minutes each week. The exercise should increase your heart rate and make you sweat (moderate-intensity exercise).  Most  adults should also do strengthening exercises at least twice a week. This is in addition to the moderate-intensity exercise. Maintain a healthy weight  Body mass index (BMI) is a measurement that can be used to identify possible weight problems. It estimates body fat based on height and weight. Your health care provider can help determine your BMI and help you achieve or maintain a healthy weight.  For females 57 years of age and older:  A BMI below 18.5 is considered underweight.  A BMI of 18.5 to 24.9 is normal.  A BMI of 25 to 29.9 is considered  overweight.  A BMI of 30 and above is considered obese. Watch levels of cholesterol and blood lipids  You should start having your blood tested for lipids and cholesterol at 61 years of age, then have this test every 5 years.  You may need to have your cholesterol levels checked more often if:  Your lipid or cholesterol levels are high.  You are older than 61 years of age.  You are at high risk for heart disease. Cancer screening Lung Cancer  Lung cancer screening is recommended for adults 52-17 years old who are at high risk for lung cancer because of a history of smoking.  A yearly low-dose CT scan of the lungs is recommended for people who:  Currently smoke.  Have quit within the past 15 years.  Have at least a 30-pack-year history of smoking. A pack year is smoking an average of one pack of cigarettes a day for 1 year.  Yearly screening should continue until it has been 15 years since you quit.  Yearly screening should stop if you develop a health problem that would prevent you from having lung cancer treatment. Breast Cancer  Practice breast self-awareness. This means understanding how your breasts normally appear and feel.  It also means doing regular breast self-exams. Let your health care provider know about any changes, no matter how small.  If you are in your 20s or 30s, you should have a clinical breast exam (CBE) by a health care provider every 1-3 years as part of a regular health exam.  If you are 58 or older, have a CBE every year. Also consider having a breast X-ray (mammogram) every year.  If you have a family history of breast cancer, talk to your health care provider about genetic screening.  If you are at high risk for breast cancer, talk to your health care provider about having an MRI and a mammogram every year.  Breast cancer gene (BRCA) assessment is recommended for women who have family members with BRCA-related cancers. BRCA-related cancers  include:  Breast.  Ovarian.  Tubal.  Peritoneal cancers.  Results of the assessment will determine the need for genetic counseling and BRCA1 and BRCA2 testing. Cervical Cancer  Your health care provider may recommend that you be screened regularly for cancer of the pelvic organs (ovaries, uterus, and vagina). This screening involves a pelvic examination, including checking for microscopic changes to the surface of your cervix (Pap test). You may be encouraged to have this screening done every 3 years, beginning at age 59.  For women ages 82-65, health care providers may recommend pelvic exams and Pap testing every 3 years, or they may recommend the Pap and pelvic exam, combined with testing for human papilloma virus (HPV), every 5 years. Some types of HPV increase your risk of cervical cancer. Testing for HPV may also be done on women of any age with unclear  Pap test results.  Other health care providers may not recommend any screening for nonpregnant women who are considered low risk for pelvic cancer and who do not have symptoms. Ask your health care provider if a screening pelvic exam is right for you.  If you have had past treatment for cervical cancer or a condition that could lead to cancer, you need Pap tests and screening for cancer for at least 20 years after your treatment. If Pap tests have been discontinued, your risk factors (such as having a new sexual partner) need to be reassessed to determine if screening should resume. Some women have medical problems that increase the chance of getting cervical cancer. In these cases, your health care provider may recommend more frequent screening and Pap tests. Colorectal Cancer  This type of cancer can be detected and often prevented.  Routine colorectal cancer screening usually begins at 61 years of age and continues through 61 years of age.  Your health care provider may recommend screening at an earlier age if you have risk factors  for colon cancer.  Your health care provider may also recommend using home test kits to check for hidden blood in the stool.  A small camera at the end of a tube can be used to examine your colon directly (sigmoidoscopy or colonoscopy). This is done to check for the earliest forms of colorectal cancer.  Routine screening usually begins at age 51.  Direct examination of the colon should be repeated every 5-10 years through 61 years of age. However, you may need to be screened more often if early forms of precancerous polyps or small growths are found. Skin Cancer  Check your skin from head to toe regularly.  Tell your health care provider about any new moles or changes in moles, especially if there is a change in a mole's shape or color.  Also tell your health care provider if you have a mole that is larger than the size of a pencil eraser.  Always use sunscreen. Apply sunscreen liberally and repeatedly throughout the day.  Protect yourself by wearing long sleeves, pants, a wide-brimmed hat, and sunglasses whenever you are outside. Heart disease, diabetes, and high blood pressure  High blood pressure causes heart disease and increases the risk of stroke. High blood pressure is more likely to develop in:  People who have blood pressure in the high end of the normal range (130-139/85-89 mm Hg).  People who are overweight or obese.  People who are African American.  If you are 52-73 years of age, have your blood pressure checked every 3-5 years. If you are 41 years of age or older, have your blood pressure checked every year. You should have your blood pressure measured twice-once when you are at a hospital or clinic, and once when you are not at a hospital or clinic. Record the average of the two measurements. To check your blood pressure when you are not at a hospital or clinic, you can use:  An automated blood pressure machine at a pharmacy.  A home blood pressure monitor.  If you  are between 57 years and 77 years old, ask your health care provider if you should take aspirin to prevent strokes.  Have regular diabetes screenings. This involves taking a blood sample to check your fasting blood sugar level.  If you are at a normal weight and have a low risk for diabetes, have this test once every three years after 61 years of age.  If  you are overweight and have a high risk for diabetes, consider being tested at a younger age or more often. Preventing infection Hepatitis B  If you have a higher risk for hepatitis B, you should be screened for this virus. You are considered at high risk for hepatitis B if:  You were born in a country where hepatitis B is common. Ask your health care provider which countries are considered high risk.  Your parents were born in a high-risk country, and you have not been immunized against hepatitis B (hepatitis B vaccine).  You have HIV or AIDS.  You use needles to inject street drugs.  You live with someone who has hepatitis B.  You have had sex with someone who has hepatitis B.  You get hemodialysis treatment.  You take certain medicines for conditions, including cancer, organ transplantation, and autoimmune conditions. Hepatitis C  Blood testing is recommended for:  Everyone born from [birth year] through 1965.  Anyone with known risk factors for hepatitis C. Sexually transmitted infections (STIs)  You should be screened for sexually transmitted infections (STIs) including gonorrhea and chlamydia if:  You are sexually active and are younger than 60 years of age.  You are older than 61 years of age and your health care provider tells you that you are at risk for this type of infection.  Your sexual activity has changed since you were last screened and you are at an increased risk for chlamydia or gonorrhea. Ask your health care provider if you are at risk.  If you do not have HIV, but are at risk, it may be recommended that you  take a prescription medicine daily to prevent HIV infection. This is called pre-exposure prophylaxis (PrEP). You are considered at risk if:  You are sexually active and do not regularly use condoms or know the HIV status of your partner(s).  You take drugs by injection.  You are sexually active with a partner who has HIV. Talk with your health care provider about whether you are at high risk of being infected with HIV. If you choose to begin PrEP, you should first be tested for HIV. You should then be tested every 3 months for as long as you are taking PrEP. Pregnancy  If you are premenopausal and you may become pregnant, ask your health care provider about preconception counseling.  If you may become pregnant, take 400 to 800 micrograms (mcg) of folic acid every day.  If you want to prevent pregnancy, talk to your health care provider about birth control (contraception). Osteoporosis and menopause  Osteoporosis is a disease in which the bones lose minerals and strength with aging. This can result in serious bone fractures. Your risk for osteoporosis can be identified using a bone density scan.  If you are 71 years of age or older, or if you are at risk for osteoporosis and fractures, ask your health care provider if you should be screened.  Ask your health care provider whether you should take a calcium or vitamin D supplement to lower your risk for osteoporosis.  Menopause may have certain physical symptoms and risks.  Hormone replacement therapy may reduce some of these symptoms and risks. Talk to your health care provider about whether hormone replacement therapy is right for you. Follow these instructions at home:  Schedule regular health, dental, and eye exams.  Stay current with your immunizations.  Do not use any tobacco products including cigarettes, chewing tobacco, or electronic cigarettes.  If you are pregnant,  do not drink alcohol.  If you are breastfeeding, limit  how much and how often you drink alcohol.  Limit alcohol intake to no more than 1 drink per day for nonpregnant women. One drink equals 12 ounces of beer, 5 ounces of wine, or 1 ounces of hard liquor.  Do not use street drugs.  Do not share needles.  Ask your health care provider for help if you need support or information about quitting drugs.  Tell your health care provider if you often feel depressed.  Tell your health care provider if you have ever been abused or do not feel safe at home. This information is not intended to replace advice given to you by your health care provider. Make sure you discuss any questions you have with your health care provider. Document Released: 11/08/2010 Document Revised: 10/01/2015 Document Reviewed: 01/27/2015 Elsevier Interactive Patient Education  2017 [place] K. Fady Stamps M.D. Allergies as of 07/13/2016      Reactions   Codeine Itching, Swelling, Rash, Other (See Comments)   SWELLING REACTION UNSPECIFIED       Medication List       Accurate as of 07/13/16 11:59 PM. Always use your most recent med list.          ALIVE ONCE DAILY WOMENS 50+ Tabs Take 1 tablet by mouth daily.   ALPRAZolam 0.5 MG tablet Commonly known as:  XANAX 1 po as needed, maximum 3 x per day , avoid regular use: Do not take with alcohol   aspirin EC 81 MG tablet Take 81 mg by mouth daily.   calcium carbonate 600 MG Tabs tablet Commonly known as:  OS-CAL Take 600 mg by mouth daily.   citalopram 20 MG tablet Commonly known as:  CELEXA Take 1 tablet (20 mg total) by mouth daily.   dicyclomine 10 MG capsule Commonly known as:  BENTYL Take 1 capsule (10 mg total) by mouth every 6 (six) hours as needed for spasms.   diphenhydrAMINE 25 MG tablet Commonly known as:  BENADRYL Take 25 mg by mouth at bedtime as needed for sleep.   diphenhydramine-acetaminophen 25-500 MG Tabs tablet Commonly known as:  TYLENOL PM Take 2 tablets by mouth at  bedtime.   naproxen sodium 220 MG tablet Commonly known as:  ANAPROX Take 440 mg by mouth daily.   TRUBIOTICS Caps Take 1 capsule by mouth daily.   Turmeric 500 MG Tabs Take 500 mg by mouth daily.   vitamin B-12 500 MCG tablet Commonly known as:  CYANOCOBALAMIN Take 500 mcg by mouth daily.   VITAMIN D3 PO Take 1 capsule by mouth daily.

## 2016-07-13 ENCOUNTER — Ambulatory Visit (INDEPENDENT_AMBULATORY_CARE_PROVIDER_SITE_OTHER): Payer: BLUE CROSS/BLUE SHIELD | Admitting: Internal Medicine

## 2016-07-13 ENCOUNTER — Encounter: Payer: Self-pay | Admitting: Internal Medicine

## 2016-07-13 VITALS — BP 124/70 | HR 70 | Temp 98.4°F | Ht 61.0 in | Wt 158.8 lb

## 2016-07-13 DIAGNOSIS — Z87891 Personal history of nicotine dependence: Secondary | ICD-10-CM

## 2016-07-13 DIAGNOSIS — E785 Hyperlipidemia, unspecified: Secondary | ICD-10-CM

## 2016-07-13 DIAGNOSIS — F4323 Adjustment disorder with mixed anxiety and depressed mood: Secondary | ICD-10-CM | POA: Diagnosis not present

## 2016-07-13 DIAGNOSIS — Z79899 Other long term (current) drug therapy: Secondary | ICD-10-CM | POA: Diagnosis not present

## 2016-07-13 MED ORDER — ALPRAZOLAM 0.5 MG PO TABS: ORAL_TABLET | ORAL | 0 refills | Status: DC

## 2016-07-13 MED ORDER — CITALOPRAM HYDROBROMIDE 20 MG PO TABS: 20.0000 mg | ORAL_TABLET | Freq: Every day | ORAL | 3 refills | Status: DC

## 2016-07-13 NOTE — Patient Instructions (Signed)
Continue  Citalopram  Continue tobacco free    Alprazolam with caution as needed . Contact us if  And when need refill  ] Healthy lifestyle includes : At least 150 minutes of exercise weeks  , weight at healthy levels, which is usually   BMI 19-25. Avoid trans fats and processed foods;  Increase fresh fruits and veges to 5 servings per day. And avoid sweet beverages including tea and juice. Mediterranean diet with olive oil and nuts have been noted to be heart and brain healthy . Avoid tobacco products . Limit  alcohol to  7 per week for women and 14 servings for men.  Get adequate sleep . Wear seat belts . Don't text and drive .   Health Maintenance, Female Adopting a healthy lifestyle and getting preventive care can go a long way to promote health and wellness. Talk with your health care provider about what schedule of regular examinations is right for you. This is a good chance for you to check in with your provider about disease prevention and staying healthy. In between checkups, there are plenty of things you can do on your own. Experts have done a lot of research about which lifestyle changes and preventive measures are most likely to keep you healthy. Ask your health care provider for more information. Weight and diet Eat a healthy diet  Be sure to include plenty of vegetables, fruits, low-fat dairy products, and lean protein.  Do not eat a lot of foods high in solid fats, added sugars, or salt.  Get regular exercise. This is one of the most important things you can do for your health.  Most adults should exercise for at least 150 minutes each week. The exercise should increase your heart rate and make you sweat (moderate-intensity exercise).  Most adults should also do strengthening exercises at least twice a week. This is in addition to the moderate-intensity exercise. Maintain a healthy weight  Body mass index (BMI) is a measurement that can be used to identify possible  weight problems. It estimates body fat based on height and weight. Your health care provider can help determine your BMI and help you achieve or maintain a healthy weight.  For females 10 years of age and older:  A BMI below 18.5 is considered underweight.  A BMI of 18.5 to 24.9 is normal.  A BMI of 25 to 29.9 is considered overweight.  A BMI of 30 and above is considered obese. Watch levels of cholesterol and blood lipids  You should start having your blood tested for lipids and cholesterol at 61 years of age, then have this test every 5 years.  You may need to have your cholesterol levels checked more often if:  Your lipid or cholesterol levels are high.  You are older than 61 years of age.  You are at high risk for heart disease. Cancer screening Lung Cancer  Lung cancer screening is recommended for adults 52-70 years old who are at high risk for lung cancer because of a history of smoking.  A yearly low-dose CT scan of the lungs is recommended for people who:  Currently smoke.  Have quit within the past 15 years.  Have at least a 30-pack-year history of smoking. A pack year is smoking an average of one pack of cigarettes a day for 1 year.  Yearly screening should continue until it has been 15 years since you quit.  Yearly screening should stop if you develop a health problem that would  prevent you from having lung cancer treatment. Breast Cancer  Practice breast self-awareness. This means understanding how your breasts normally appear and feel.  It also means doing regular breast self-exams. Let your health care provider know about any changes, no matter how small.  If you are in your 20s or 30s, you should have a clinical breast exam (CBE) by a health care provider every 1-3 years as part of a regular health exam.  If you are 21 or older, have a CBE every year. Also consider having a breast X-ray (mammogram) every year.  If you have a family history of breast  cancer, talk to your health care provider about genetic screening.  If you are at high risk for breast cancer, talk to your health care provider about having an MRI and a mammogram every year.  Breast cancer gene (BRCA) assessment is recommended for women who have family members with BRCA-related cancers. BRCA-related cancers include:  Breast.  Ovarian.  Tubal.  Peritoneal cancers.  Results of the assessment will determine the need for genetic counseling and BRCA1 and BRCA2 testing. Cervical Cancer  Your health care provider may recommend that you be screened regularly for cancer of the pelvic organs (ovaries, uterus, and vagina). This screening involves a pelvic examination, including checking for microscopic changes to the surface of your cervix (Pap test). You may be encouraged to have this screening done every 3 years, beginning at age 59.  For women ages 34-65, health care providers may recommend pelvic exams and Pap testing every 3 years, or they may recommend the Pap and pelvic exam, combined with testing for human papilloma virus (HPV), every 5 years. Some types of HPV increase your risk of cervical cancer. Testing for HPV may also be done on women of any age with unclear Pap test results.  Other health care providers may not recommend any screening for nonpregnant women who are considered low risk for pelvic cancer and who do not have symptoms. Ask your health care provider if a screening pelvic exam is right for you.  If you have had past treatment for cervical cancer or a condition that could lead to cancer, you need Pap tests and screening for cancer for at least 20 years after your treatment. If Pap tests have been discontinued, your risk factors (such as having a new sexual partner) need to be reassessed to determine if screening should resume. Some women have medical problems that increase the chance of getting cervical cancer. In these cases, your health care provider may  recommend more frequent screening and Pap tests. Colorectal Cancer  This type of cancer can be detected and often prevented.  Routine colorectal cancer screening usually begins at 61 years of age and continues through 61 years of age.  Your health care provider may recommend screening at an earlier age if you have risk factors for colon cancer.  Your health care provider may also recommend using home test kits to check for hidden blood in the stool.  A small camera at the end of a tube can be used to examine your colon directly (sigmoidoscopy or colonoscopy). This is done to check for the earliest forms of colorectal cancer.  Routine screening usually begins at age 22.  Direct examination of the colon should be repeated every 5-10 years through 61 years of age. However, you may need to be screened more often if early forms of precancerous polyps or small growths are found. Skin Cancer  Check your skin from head  to toe regularly.  Tell your health care provider about any new moles or changes in moles, especially if there is a change in a mole's shape or color.  Also tell your health care provider if you have a mole that is larger than the size of a pencil eraser.  Always use sunscreen. Apply sunscreen liberally and repeatedly throughout the day.  Protect yourself by wearing long sleeves, pants, a wide-brimmed hat, and sunglasses whenever you are outside. Heart disease, diabetes, and high blood pressure  High blood pressure causes heart disease and increases the risk of stroke. High blood pressure is more likely to develop in:  People who have blood pressure in the high end of the normal range (130-139/85-89 mm Hg).  People who are overweight or obese.  People who are African American.  If you are 76-18 years of age, have your blood pressure checked every 3-5 years. If you are 72 years of age or older, have your blood pressure checked every year. You should have your blood pressure  measured twice-once when you are at a hospital or clinic, and once when you are not at a hospital or clinic. Record the average of the two measurements. To check your blood pressure when you are not at a hospital or clinic, you can use:  An automated blood pressure machine at a pharmacy.  A home blood pressure monitor.  If you are between 100 years and 96 years old, ask your health care provider if you should take aspirin to prevent strokes.  Have regular diabetes screenings. This involves taking a blood sample to check your fasting blood sugar level.  If you are at a normal weight and have a low risk for diabetes, have this test once every three years after 61 years of age.  If you are overweight and have a high risk for diabetes, consider being tested at a younger age or more often. Preventing infection Hepatitis B  If you have a higher risk for hepatitis B, you should be screened for this virus. You are considered at high risk for hepatitis B if:  You were born in a country where hepatitis B is common. Ask your health care provider which countries are considered high risk.  Your parents were born in a high-risk country, and you have not been immunized against hepatitis B (hepatitis B vaccine).  You have HIV or AIDS.  You use needles to inject street drugs.  You live with someone who has hepatitis B.  You have had sex with someone who has hepatitis B.  You get hemodialysis treatment.  You take certain medicines for conditions, including cancer, organ transplantation, and autoimmune conditions. Hepatitis C  Blood testing is recommended for:  Everyone born from [birth year] through 1965.  Anyone with known risk factors for hepatitis C. Sexually transmitted infections (STIs)  You should be screened for sexually transmitted infections (STIs) including gonorrhea and chlamydia if:  You are sexually active and are younger than 61 years of age.  You are older than 61 years of age and  your health care provider tells you that you are at risk for this type of infection.  Your sexual activity has changed since you were last screened and you are at an increased risk for chlamydia or gonorrhea. Ask your health care provider if you are at risk.  If you do not have HIV, but are at risk, it may be recommended that you take a prescription medicine daily to prevent HIV infection. This is  called pre-exposure prophylaxis (PrEP). You are considered at risk if:  You are sexually active and do not regularly use condoms or know the HIV status of your partner(s).  You take drugs by injection.  You are sexually active with a partner who has HIV. Talk with your health care provider about whether you are at high risk of being infected with HIV. If you choose to begin PrEP, you should first be tested for HIV. You should then be tested every 3 months for as long as you are taking PrEP. Pregnancy  If you are premenopausal and you may become pregnant, ask your health care provider about preconception counseling.  If you may become pregnant, take 400 to 800 micrograms (mcg) of folic acid every day.  If you want to prevent pregnancy, talk to your health care provider about birth control (contraception). Osteoporosis and menopause  Osteoporosis is a disease in which the bones lose minerals and strength with aging. This can result in serious bone fractures. Your risk for osteoporosis can be identified using a bone density scan.  If you are 47 years of age or older, or if you are at risk for osteoporosis and fractures, ask your health care provider if you should be screened.  Ask your health care provider whether you should take a calcium or vitamin D supplement to lower your risk for osteoporosis.  Menopause may have certain physical symptoms and risks.  Hormone replacement therapy may reduce some of these symptoms and risks. Talk to your health care provider about whether hormone replacement  therapy is right for you. Follow these instructions at home:  Schedule regular health, dental, and eye exams.  Stay current with your immunizations.  Do not use any tobacco products including cigarettes, chewing tobacco, or electronic cigarettes.  If you are pregnant, do not drink alcohol.  If you are breastfeeding, limit how much and how often you drink alcohol.  Limit alcohol intake to no more than 1 drink per day for nonpregnant women. One drink equals 12 ounces of beer, 5 ounces of wine, or 1 ounces of hard liquor.  Do not use street drugs.  Do not share needles.  Ask your health care provider for help if you need support or information about quitting drugs.  Tell your health care provider if you often feel depressed.  Tell your health care provider if you have ever been abused or do not feel safe at home. This information is not intended to replace advice given to you by your health care provider. Make sure you discuss any questions you have with your health care provider. Document Released: 11/08/2010 Document Revised: 10/01/2015 Document Reviewed: 01/27/2015 Elsevier Interactive Patient Education  2017 [employer].

## 2016-07-14 ENCOUNTER — Encounter: Payer: Self-pay | Admitting: Internal Medicine

## 2016-11-07 ENCOUNTER — Telehealth: Payer: Self-pay

## 2016-11-07 MED ORDER — DICYCLOMINE HCL 10 MG PO CAPS: 10.0000 mg | ORAL_CAPSULE | Freq: Four times a day (QID) | ORAL | 2 refills | Status: DC | PRN

## 2016-11-07 NOTE — Telephone Encounter (Signed)
Refill x 3 

## 2016-11-07 NOTE — Telephone Encounter (Signed)
Received fax for patients dicyclomine 10mg  tabs, okay to refill Sir?

## 2016-11-07 NOTE — Telephone Encounter (Signed)
Dicyclomine refilled.

## 2017-01-25 ENCOUNTER — Other Ambulatory Visit: Payer: Self-pay | Admitting: Internal Medicine

## 2017-01-27 ENCOUNTER — Encounter: Payer: Self-pay | Admitting: Internal Medicine

## 2017-02-06 NOTE — Telephone Encounter (Signed)
Ok to refill x 1  

## 2017-06-15 ENCOUNTER — Other Ambulatory Visit: Payer: Self-pay | Admitting: Internal Medicine

## 2017-06-15 ENCOUNTER — Encounter: Payer: Self-pay | Admitting: Internal Medicine

## 2017-06-15 ENCOUNTER — Ambulatory Visit: Payer: BLUE CROSS/BLUE SHIELD | Admitting: Internal Medicine

## 2017-06-15 VITALS — BP 120/76 | HR 76 | Temp 98.4°F | Wt 155.8 lb

## 2017-06-15 DIAGNOSIS — J018 Other acute sinusitis: Secondary | ICD-10-CM | POA: Diagnosis not present

## 2017-06-15 DIAGNOSIS — R05 Cough: Secondary | ICD-10-CM

## 2017-06-15 DIAGNOSIS — R058 Other specified cough: Secondary | ICD-10-CM

## 2017-06-15 DIAGNOSIS — F4323 Adjustment disorder with mixed anxiety and depressed mood: Secondary | ICD-10-CM

## 2017-06-15 DIAGNOSIS — R053 Chronic cough: Secondary | ICD-10-CM

## 2017-06-15 DIAGNOSIS — Z79899 Other long term (current) drug therapy: Secondary | ICD-10-CM

## 2017-06-15 MED ORDER — BENZONATATE 100 MG PO CAPS: 100.0000 mg | ORAL_CAPSULE | Freq: Three times a day (TID) | ORAL | 0 refills | Status: DC | PRN

## 2017-06-15 MED ORDER — ALPRAZOLAM 0.5 MG PO TABS: ORAL_TABLET | ORAL | 0 refills | Status: DC

## 2017-06-15 MED ORDER — AMOXICILLIN-POT CLAVULANATE 875-125 MG PO TABS: 1.0000 | ORAL_TABLET | Freq: Two times a day (BID) | ORAL | 0 refills | Status: DC

## 2017-06-15 MED ORDER — FLUCONAZOLE 150 MG PO TABS: 150.0000 mg | ORAL_TABLET | Freq: Once | ORAL | 1 refills | Status: AC

## 2017-06-15 NOTE — Patient Instructions (Addendum)
Treating for sinuitis  tha is aggravating cough  Add flonase  And saline nose spray   Antibiotic for 5-7 days  Can add yeast med if needed.    If on going let us know  .   Consider chest x ray .

## 2017-06-15 NOTE — Progress Notes (Signed)
Chief Complaint  Patient presents with  . Cough    Started in December. Pt states that she figured it was viral but it hasnt gone away. Pt states the sinus headache started 06/05/17 and she is blowing out and coughing up thick clear mucus . Denies fever.    HPI: KAIRIE VANGIESON 62 y.o.  SDA   See a bove    Onset like a cold and now ongoing and has  Headache and siinus headache also .  No fever  Sob  Hemoptysis   Asks for tessalon perles that helped in past      No tobacco  Gets year infections with   Antibiotic s  ROS: See pertinent positives and negatives per HPI. No cp sob    Past Medical History:  Diagnosis Date  . Anxiety   . Arthritis    R knee, back, hands   . Cellulitis of knee 04/2016   RT KNEE  . Childhood asthma   . Complication of anesthesia    woke up slowly - 26yrs. ago  . Depression    related to husband illness and death  . History of jaundice    as a child    . Hx of varicella   . Hyperthyroidism    during pregnancy, treated & resolved post partum   . IBS (irritable bowel syndrome)   . LBBB (left bundle branch block)   . Scoliosis   . Scoliosis 07/2015  . Thyroid disease in pregnancy     Family History  Problem Relation Age of Onset  . Alcohol abuse Mother        died from stomach ulcers  . Stroke Father        died  from cva and mi  . Hypertension Father   . Hyperlipidemia Father   . Coronary artery disease Father        Age 71  . Colon polyps Sister   . Hepatitis C Brother   . Colon cancer Neg Hx     Social History   Socioeconomic History  . Marital status: Widowed    Spouse name: None  . Number of children: 3  . Years of education: None  . Highest education level: None  Social Needs  . Financial resource strain: None  . Food insecurity - worry: None  . Food insecurity - inability: None  . Transportation needs - medical: None  . Transportation needs - non-medical: None  Occupational History  . Occupation: self    Comment: Visual merchandiser  Tobacco Use  . Smoking status: Former Smoker    Packs/day: 0.25    Years: 40.00    Pack years: 10.00    Types: Cigarettes    Last attempt to quit: 01/06/2016    Years since quitting: 1.4  . Smokeless tobacco: Never Used  . Tobacco comment: none in about 1 week  Substance and Sexual Activity  . Alcohol use: Yes    Alcohol/week: 0.0 oz    Comment: socially-wine- daily  . Drug use: No  . Sexual activity: None  Other Topics Concern  . None  Social History Narrative   Husband passed away on 03/03/2008 hep c and cirrhosis.   She is hep c negative 2006   G3P3   HH of 2-3  son no pets    No falls .  Has smoke detector and wears seat belts. POsitive  firearms. No excess sun exposure.    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   Had time of nono insuranced and cannot affort 800 per month for catastrophic    Stopped tobacco fall 2017 . ocass etoh.  No rec drugs.   Daughter patient in our practice   [name]    Outpatient Medications Prior to Visit  Medication Sig Dispense Refill  . aspirin EC 81 MG tablet Take 81 mg by mouth daily.    . calcium carbonate (OS-CAL) 600 MG TABS tablet Take 600 mg by mouth daily.    . Cholecalciferol (VITAMIN D3 PO) Take 1 capsule by mouth daily.    . citalopram (CELEXA) 20 MG tablet Take 1 tablet (20 mg total) by mouth daily. 90 tablet 3  . dicyclomine (BENTYL) 10 MG capsule Take 1 capsule (10 mg total) by mouth every 6 (six) hours as needed (abdominal spasms). 60 capsule 2  . diphenhydrAMINE (BENADRYL) 25 MG tablet Take 25 mg by mouth at bedtime as needed for sleep.    . diphenhydramine-acetaminophen (TYLENOL PM) 25-500 MG TABS tablet Take 2 tablets by mouth at bedtime.     . Multiple Vitamins-Minerals (ALIVE ONCE DAILY WOMENS 50+) TABS Take 1 tablet by mouth daily.    . naproxen sodium (ANAPROX) 220 MG tablet Take 440 mg by mouth daily.    . Probiotic Product (TRUBIOTICS) CAPS Take 1 capsule by mouth daily.    .  Turmeric 500 MG TABS Take 500 mg by mouth daily.    . vitamin B-12 (CYANOCOBALAMIN) 500 MCG tablet Take 500 mcg by mouth daily.    Marland Kitchen ALPRAZolam (XANAX) 0.5 MG tablet TAKE 1 TABLET BY MOUTH AS NEEDED, MAX 3 TABLETS PER 24 HOURS, AVOID REGULAR USE, DO NOT TAKE WITH ALCOHOL 30 tablet 0   No facility-administered medications prior to visit.      EXAM:  BP 120/76 (BP Location: Right Arm, Patient Position: Sitting, Cuff Size: Normal)   Pulse 76   Temp 98.4 F (36.9 C) (Oral)   Wt 155 lb 12.8 oz (70.7 kg)   BMI 29.44 kg/m   Body mass index is 29.44 kg/m. WDWN in NAD  quiet respirations; very congested  somewhat hoarse. Non toxic . HEENT: Normocephalic ;atraumatic , Eyes;  PERRL, EOMs  Full, lids and conjunctiva clear,,Ears: no deformities, canals nl, TM landmarks normal, Nose: no deformity or discharge but congested;facetender  Maxillary andf frontal  tender Mouth : OP clear without lesion or edema . Neck: Supple without adenopathy or masses or bruits Chest:  Clear to A without wheezes rales or rhonchi CV:  S1-S2 no gallops or murmurs peripheral perfusion is normal Skin :nl perfusion and no acute rashes    ASSESSMENT AND PLAN:  Discussed the following assessment and plan:  Cough, persistent - prolonged sx and now sinus pain   Medication management  Other acute sinusitis, recurrence not specified  Adjustment reaction with anxiety and depression - doing well  ok to refill the alprazolam as needed   Respiratory tract congestion with cough dsic  Antibiotic use risk benefit  Add flonase    z pack not first line and not   As effective as  Other for sinusitis  So will  Add augmentinfirst line rx    Diflucan if needed   She has had wheezy bronchitis in past   Has had tessalon perles help  -Patient advised to return or notify health care team  if symptoms worsen ,persist or new concerns arise.  Patient Instructions  Treating for sinuitis  tha is  aggravating cough  Add flonase  And  saline nose spray   Antibiotic for 5-7 days  Can add yeast med if needed.    If on going let us know  .   Consider chest x ray .     Neta Mends. Patricio Popwell M.D.

## 2017-07-24 ENCOUNTER — Other Ambulatory Visit: Payer: Self-pay | Admitting: Internal Medicine

## 2017-09-12 ENCOUNTER — Encounter: Payer: Self-pay | Admitting: Internal Medicine

## 2017-09-12 ENCOUNTER — Telehealth: Payer: Self-pay | Admitting: Internal Medicine

## 2017-09-12 ENCOUNTER — Ambulatory Visit: Payer: BLUE CROSS/BLUE SHIELD | Admitting: Internal Medicine

## 2017-09-12 VITALS — BP 132/80 | HR 70 | Temp 97.6°F | Wt 157.2 lb

## 2017-09-12 DIAGNOSIS — R062 Wheezing: Secondary | ICD-10-CM

## 2017-09-12 DIAGNOSIS — R0602 Shortness of breath: Secondary | ICD-10-CM

## 2017-09-12 MED ORDER — ALBUTEROL SULFATE HFA 108 (90 BASE) MCG/ACT IN AERS
2.0000 | INHALATION_SPRAY | Freq: Four times a day (QID) | RESPIRATORY_TRACT | 2 refills | Status: DC | PRN
Start: 2017-09-12 — End: 2017-09-12

## 2017-09-12 MED ORDER — PREDNISONE 20 MG PO TABS: 20.0000 mg | ORAL_TABLET | Freq: Two times a day (BID) | ORAL | 0 refills | Status: DC

## 2017-09-12 MED ORDER — IPRATROPIUM-ALBUTEROL 0.5-2.5 (3) MG/3ML IN SOLN
3.0000 mL | Freq: Once | RESPIRATORY_TRACT | Status: AC
  Administered 2017-09-12: 3 mL via RESPIRATORY_TRACT

## 2017-09-12 MED ORDER — ALBUTEROL SULFATE HFA 108 (90 BASE) MCG/ACT IN AERS: 2.0000 | INHALATION_SPRAY | Freq: Four times a day (QID) | RESPIRATORY_TRACT | 1 refills | Status: DC | PRN

## 2017-09-12 NOTE — Telephone Encounter (Signed)
Copied from CRM [id number]. Topic: Quick Communication - See Telephone Encounter >> Sep 12, 2017  4:34 PM Landry Mellow wrote: CRM for notification. See Telephone encounter for: 09/12/17.  Medication albuterol (PROVENTIL HFA;VENTOLIN HFA) 108 (90 Base) MCG/ACT inhaler  is not covered. Please send in rx for different medication  [name] on [address]  Cb for pt is [phone]

## 2017-09-12 NOTE — Telephone Encounter (Signed)
Will send in proair to see if covered

## 2017-09-12 NOTE — Patient Instructions (Addendum)
You have significant wheezing which can be triggered by  Viruses  Pollen or other irritants  That you may breath in  . Like asthmatic   Symptoms   consider chest x ray if fever not responding to rx .  Of pain    elam  [place]   wlsk in during open hours .   inhlaler as needed every 6 hours   I fnot better then plan fu      1-2 weeks sometimes we use other inhalers.     Bronchospasm, Adult Bronchospasm is a tightening of the airways going into the lungs. During an episode, it may be harder to breathe. You may cough, and you may make a whistling sound when you breathe (wheeze). This condition often affects people with asthma. What are the causes? This condition is caused by swelling and irritation in the airways. It can be triggered by:  An infection (common).  Seasonal allergies.  An allergic reaction.  Exercise.  Irritants. These include pollution, cigarette smoke, strong odors, aerosol sprays, and paint fumes.  Weather changes. Winds increase molds and pollens in the air. Cold air may cause swelling.  Stress and emotional upset.  What are the signs or symptoms? Symptoms of this condition include:  Wheezing. If the episode was triggered by an allergy, wheezing may start right away or hours later.  Nighttime coughing.  Frequent or severe coughing with a simple cold.  Chest tightness.  Shortness of breath.  Decreased ability to exercise.  How is this diagnosed? This condition is usually diagnosed with a review of your medical history and a physical exam. Tests, such as lung function tests, are sometimes done to look for other conditions. The need for a chest X-ray depends on where the wheezing occurs and whether it is the first time you have wheezed. How is this treated? This condition may be treated with:  Inhaled medicines. These open up the airways and help you breathe. They can be taken with an inhaler or a nebulizer device.  Corticosteroid medicines. These may  be given for severe bronchospasm, usually when it is associated with asthma.  Avoiding triggers, such as irritants, infection, or allergies.  Follow these instructions at home: Medicines  Take over-the-counter and prescription medicines only as told by your health care provider.  If you need to use an inhaler or nebulizer to take your medicine, ask your health care provider to explain how to use it correctly. If you were given a spacer, always use it with your inhaler. Lifestyle  Reduce the number of triggers in your home. To do this: ? Change your heating and air conditioning filter at least once a month. ? Limit your use of fireplaces and wood stoves. ? Do not smoke. Do not allow smoking in your home. ? Avoid using perfumes and fragrances. ? Get rid of pests, such as roaches and mice, and their droppings. ? Remove any mold from your home. ? Keep your house clean and dust free. Use unscented cleaning products. ? Replace carpet with wood, tile, or vinyl flooring. Carpet can trap dander and dust. ? Use allergy-proof pillows, mattress covers, and box spring covers. ? Wash bed sheets and blankets every week in hot water. Dry them in a dryer. ? Use blankets that are made of polyester or cotton. ? Wash your hands often. ? Do not allow pets in your bedroom.  Avoid breathing in cold air when you exercise. General instructions  Have a plan for seeking medical care. Know  when to call your health care provider and local emergency services, and where to get emergency care.  Stay up to date on your immunizations.  When you have an episode of bronchospasm, stay calm. Try to relax and breathe more slowly.  If you have asthma, make sure you have an asthma action plan.  Keep all follow-up visits as told by your health care provider. This is important. Contact a health care provider if:  You have muscle aches.  You have chest pain.  The mucus that you cough up (sputum) changes from clear  or white to yellow, green, gray, or bloody.  You have a fever.  Your sputum gets thicker. Get help right away if:  Your wheezing and coughing get worse, even after you take your prescribed medicines.  It gets even harder to breathe.  You develop severe chest pain. Summary  Bronchospasm is a tightening of the airways going into the lungs.  During an episode of bronchospasm, you may have a harder time breathing. You may cough and make a whistling sound when you breathe (wheeze).  Avoid exposure to triggers such as smoke, dust, mold, animal dander, and fragrances.  When you have an episode of bronchospasm, stay calm. Try to relax and breathe more slowly. This information is not intended to replace advice given to you by your health care provider. Make sure you discuss any questions you have with your health care provider. Document Released: 04/28/2003 Document Revised: 04/21/2016 Document Reviewed: 04/21/2016 Elsevier Interactive Patient Education  2017 [employer].

## 2017-09-12 NOTE — Telephone Encounter (Signed)
Albuterol hfa is not covered. May need to try Proair to see if insurance will cover this brand.   Please advise Dr Fabian Sharp, thanks.

## 2017-09-12 NOTE — Progress Notes (Signed)
Chief Complaint  Patient presents with  . Cough    Pt having coughing and wheezing 3 days. Pt states that wheezing has resoled over the last 30 mins. Pt c/o SOB and heaviness in her chest. Pt states that she was getting up yellow mucous from lungs on Saturday 5/4. 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 inhaler. Denies fever.     HPI: Valerie Love 62 y.o. come in for sda   Days  of sx  Sob and back and chest      Wheezing  .   Better this am .   no fever . Was  In [place]  And clean houses for a living    exposures pollen . A lot . Hard to work .  With sob  Bad last 2 days took alpraz last night and slept better  Cough this weekend yellow thick . Mucous now gone    Middle chest pain .  Through to back  No pleurisy   Other change and no sinus sx .   ROS: See pertinent positives and negatives per HPI. No fever chills hemoptysis  No tobacco   Past Medical History:  Diagnosis Date  . Anxiety   . Arthritis    R knee, back, hands   . Cellulitis of knee 04/2016   RT KNEE  . Childhood asthma   . Complication of anesthesia    woke up slowly - 72yrs. ago  . Depression    related to husband illness and death  . History of jaundice    as a child    . Hx of varicella   . Hyperthyroidism    during pregnancy, treated & resolved post partum   . IBS (irritable bowel syndrome)   . LBBB (left bundle branch block)   . Scoliosis   . Scoliosis 07/2015  . Thyroid disease in pregnancy   . TOBACCO DEPENDENCE 07/06/2006   Qualifier: Diagnosis of  By: Knox Royalty      Family History  Problem Relation Age of Onset  . Alcohol abuse Mother        died from stomach ulcers  . Stroke Father        died  from cva and mi  . Hypertension Father   . Hyperlipidemia Father   . Coronary artery disease Father        Age 48  . Colon polyps Sister   . Hepatitis C Brother   . Colon cancer Neg Hx     Social History   Socioeconomic History  . Marital  status: Widowed    Spouse name: Not on file  . Number of children: 3  . Years of education: Not on file  . Highest education level: Not on file  Occupational History  . Occupation: self    Comment: Geneticist, molecular  Social Needs  . Financial resource strain: Not on file  . Food insecurity:    Worry: Not on file    Inability: Not on file  . Transportation needs:    Medical: Not on file    Non-medical: Not on file  Tobacco Use  . Smoking status: Former Smoker    Packs/day: 0.25    Years: 40.00    Pack years: 10.00    Types: Cigarettes    Last attempt to quit: 01/06/2016    Years since quitting: 1.6  . Smokeless tobacco: Never Used  . Tobacco comment: none  in about 1 week  Substance and Sexual Activity  . Alcohol use: Yes    Alcohol/week: 0.0 oz    Comment: socially-wine- daily  . Drug use: No  . Sexual activity: Not on file  Lifestyle  . Physical activity:    Days per week: Not on file    Minutes per session: Not on file  . Stress: Not on file  Relationships  . Social connections:    Talks on phone: Not on file    Gets together: Not on file    Attends religious service: Not on file    Active member of club or organization: Not on file    Attends meetings of clubs or organizations: Not on file    Relationship status: Not on file  Other Topics Concern  . Not on file  Social History Narrative   Husband passed away on 2008-02-17 hep c and cirrhosis.   She is hep c negative 2006   G3P3   HH of 2-3  son no pets    No falls .  Has smoke detector and wears seat belts. POsitive  firearms. No excess sun exposure.    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   Had time of nono insuranced and cannot affort 800 per month for catastrophic    Stopped tobacco fall 2017 . ocass etoh.  No rec drugs.   Daughter patient in our practice   [name]    Outpatient Medications Prior to Visit  Medication Sig Dispense Refill  . ALPRAZolam (XANAX) 0.5 MG tablet  TAKE 1 TABLET BY MOUTH AS NEEDED, MAX 3 TABLETS PER 24 HOURS, AVOID REGULAR USE, DO NOT TAKE WITH ALCOHOL 30 tablet 0  . calcium carbonate (OS-CAL) 600 MG TABS tablet Take 600 mg by mouth daily.    . Cholecalciferol (VITAMIN D3 PO) Take 1 capsule by mouth daily.    . citalopram (CELEXA) 20 MG tablet TAKE ONE TABLET BY MOUTH DAILY 90 tablet 2  . dicyclomine (BENTYL) 10 MG capsule Take 1 capsule (10 mg total) by mouth every 6 (six) hours as needed (abdominal spasms). 60 capsule 2  . diphenhydrAMINE (BENADRYL) 25 MG tablet Take 25 mg by mouth at bedtime as needed for sleep.    . Multiple Vitamins-Minerals (ALIVE ONCE DAILY WOMENS 50+) TABS Take 1 tablet by mouth daily.    . naproxen sodium (ANAPROX) 220 MG tablet Take 440 mg by mouth daily.    . Probiotic Product (TRUBIOTICS) CAPS Take 1 capsule by mouth daily.    . vitamin B-12 (CYANOCOBALAMIN) 500 MCG tablet Take 500 mcg by mouth daily.    Marland Kitchen amoxicillin-clavulanate (AUGMENTIN) 875-125 MG tablet Take 1 tablet by mouth every 12 (twelve) hours. For sinusitis (Patient not taking: Reported on 09/12/2017) 14 tablet 0  . aspirin EC 81 MG tablet Take 81 mg by mouth daily.    . benzonatate (TESSALON) 100 MG capsule Take 1 capsule (100 mg total) by mouth 3 (three) times daily as needed for cough. (Patient not taking: Reported on 09/12/2017) 20 capsule 0  . diphenhydramine-acetaminophen (TYLENOL PM) 25-500 MG TABS tablet Take 2 tablets by mouth at bedtime.     . Turmeric 500 MG TABS Take 500 mg by mouth daily.     No facility-administered medications prior to visit.      EXAM:  BP 132/80 (BP Location: Right Arm, Patient Position: Sitting, Cuff Size: Normal)   Pulse 70   Temp 97.6 F (36.4 C) (  Oral)   Wt 157 lb 3.2 oz (71.3 kg)   SpO2 96%   BMI 29.70 kg/m   Body mass index is 29.7 kg/m.  GENERAL: vitals reviewed and listed above, alert, oriented, appears well hydrated and in no acute distress  mildy sob  Can speak  ocass thjight deep cough  Can  speak no retractions  HEENT: atraumatic, conjunctiva  clear, no obvious abnormalities on inspection of external nose and ears OP : no lesion edema or exudate  NECK: no obvious masses on inspection palpation  LUNGS:bilateral dec breath sounds   With wheezing    After neb duoneb  Inc  bs ocass wheeze no rales patient feels much better although shaky .   CV: HRRR, no clubbing cyanosis or  peripheral edema nl cap refill  MS: moves all extremities without noticeable focal  abnormality PSYCH: pleasant and cooperative, no obvious depression or anxiety Lab Results  Component Value Date   WBC 4.6 07/05/2016   HGB 13.4 07/05/2016   HCT 39.8 07/05/2016   PLT 300.0 07/05/2016   GLUCOSE 93 07/05/2016   CHOL 210 (H) 07/05/2016   TRIG 122.0 07/05/2016   HDL 70.90 07/05/2016   LDLCALC 115 (H) 07/05/2016   ALT 14 07/05/2016   AST 16 07/05/2016   NA 142 07/05/2016   K 4.5 07/05/2016   CL 106 07/05/2016   CREATININE 0.71 07/05/2016   BUN 19 07/05/2016   CO2 28 07/05/2016   TSH 1.53 07/05/2016   BP Readings from Last 3 Encounters:  09/12/17 132/80  06/15/17 120/76  07/13/16 124/70    ASSESSMENT AND PLAN:  Discussed the following assessment and plan:  Wheezing - Plan: DG Chest 2 View, ipratropium-albuterol (DUONEB) 0.5-2.5 (3) MG/3ML nebulizer solution 3 mL  Shortness of breath - Plan: DG Chest 2 View, ipratropium-albuterol (DUONEB) 0.5-2.5 (3) MG/3ML nebulizer solution 3 mL Acute broncho  spasm    Big improvement with neb bronchodilator Cp better after neb   If needed  Can do x ray of if not better continue   5 day pred and inhaler  And fu if   Needed  Or relapsing  Ongoing    -Patient advised to return or notify health care team  if  new concerns arise.  Patient Instructions  You have significant wheezing which can be triggered by  Viruses  Pollen or other irritants  That you may breath in  . Like asthmatic   Symptoms   consider chest x ray if fever not responding to rx .  Of pain    elam   [place]   wlsk in during open hours .   inhlaler as needed every 6 hours   I fnot better then plan fu      1-2 weeks sometimes we use other inhalers.     Bronchospasm, Adult Bronchospasm is a tightening of the airways going into the lungs. During an episode, it may be harder to breathe. You may cough, and you may make a whistling sound when you breathe (wheeze). This condition often affects people with asthma. What are the causes? This condition is caused by swelling and irritation in the airways. It can be triggered by:  An infection (common).  Seasonal allergies.  An allergic reaction.  Exercise.  Irritants. These include pollution, cigarette smoke, strong odors, aerosol sprays, and paint fumes.  Weather changes. Winds increase molds and pollens in the air. Cold air may cause swelling.  Stress and emotional upset.  What are the signs or symptoms?  Symptoms of this condition include:  Wheezing. If the episode was triggered by an allergy, wheezing may start right away or hours later.  Nighttime coughing.  Frequent or severe coughing with a simple cold.  Chest tightness.  Shortness of breath.  Decreased ability to exercise.  How is this diagnosed? This condition is usually diagnosed with a review of your medical history and a physical exam. Tests, such as lung function tests, are sometimes done to look for other conditions. The need for a chest X-ray depends on where the wheezing occurs and whether it is the first time you have wheezed. How is this treated? This condition may be treated with:  Inhaled medicines. These open up the airways and help you breathe. They can be taken with an inhaler or a nebulizer device.  Corticosteroid medicines. These may be given for severe bronchospasm, usually when it is associated with asthma.  Avoiding triggers, such as irritants, infection, or allergies.  Follow these instructions at home: Medicines  Take over-the-counter and  prescription medicines only as told by your health care provider.  If you need to use an inhaler or nebulizer to take your medicine, ask your health care provider to explain how to use it correctly. If you were given a spacer, always use it with your inhaler. Lifestyle  Reduce the number of triggers in your home. To do this: ? Change your heating and air conditioning filter at least once a month. ? Limit your use of fireplaces and wood stoves. ? Do not smoke. Do not allow smoking in your home. ? Avoid using perfumes and fragrances. ? Get rid of pests, such as roaches and mice, and their droppings. ? Remove any mold from your home. ? Keep your house clean and dust free. Use unscented cleaning products. ? Replace carpet with wood, tile, or vinyl flooring. Carpet can trap dander and dust. ? Use allergy-proof pillows, mattress covers, and box spring covers. ? Wash bed sheets and blankets every week in hot water. Dry them in a dryer. ? Use blankets that are made of polyester or cotton. ? Wash your hands often. ? Do not allow pets in your bedroom.  Avoid breathing in cold air when you exercise. General instructions  Have a plan for seeking medical care. Know when to call your health care provider and local emergency services, and where to get emergency care.  Stay up to date on your immunizations.  When you have an episode of bronchospasm, stay calm. Try to relax and breathe more slowly.  If you have asthma, make sure you have an asthma action plan.  Keep all follow-up visits as told by your health care provider. This is important. Contact a health care provider if:  You have muscle aches.  You have chest pain.  The mucus that you cough up (sputum) changes from clear or white to yellow, green, gray, or bloody.  You have a fever.  Your sputum gets thicker. Get help right away if:  Your wheezing and coughing get worse, even after you take your prescribed medicines.  It gets  even harder to breathe.  You develop severe chest pain. Summary  Bronchospasm is a tightening of the airways going into the lungs.  During an episode of bronchospasm, you may have a harder time breathing. You may cough and make a whistling sound when you breathe (wheeze).  Avoid exposure to triggers such as smoke, dust, mold, animal dander, and fragrances.  When you have an episode of bronchospasm, stay  calm. Try to relax and breathe more slowly. This information is not intended to replace advice given to you by your health care provider. Make sure you discuss any questions you have with your health care provider. Document Released: 04/28/2003 Document Revised: 04/21/2016 Document Reviewed: 04/21/2016 Elsevier Interactive Patient Education  2017 [employer].        [place].  M.D.

## 2017-09-13 NOTE — Telephone Encounter (Signed)
Pt aware that we sent in the Proair -- she has already picked this up and it was covered by her insurance.  Nothing further needed.

## 2017-10-18 ENCOUNTER — Telehealth (INDEPENDENT_AMBULATORY_CARE_PROVIDER_SITE_OTHER): Payer: Self-pay | Admitting: Orthopedic Surgery

## 2017-10-18 NOTE — Telephone Encounter (Signed)
Patient called asking for some antibiotics to be sent in to her pharmacy so she can get her teeth cleaned. Her appointment is July 3rd at her dentist. If she could get a call when that's been sent in. Thank you. CB # [phone]

## 2017-10-19 ENCOUNTER — Telehealth (INDEPENDENT_AMBULATORY_CARE_PROVIDER_SITE_OTHER): Payer: Self-pay | Admitting: Orthopedic Surgery

## 2017-10-19 NOTE — Telephone Encounter (Signed)
Pt had a total knee in 2017. Called pt to advise that she does not need ABX coverage prior to a dental appt. Lm on vm to call with questions.

## 2017-10-19 NOTE — Telephone Encounter (Signed)
Patient left a message stating that she needed to talk to you about an Antibiotic.  CB#[phone].  Thank you.

## 2017-10-19 NOTE — Telephone Encounter (Signed)
Pt was concerned because she had developed a cellulitis after her total knee and was admitted to the hospital for IV abx after and wanted to make sure for her specifically that it was not needed. Advised after speaking with Dr. Lajoyce Corners she does not need the coverage.

## 2017-12-01 ENCOUNTER — Other Ambulatory Visit: Payer: Self-pay | Admitting: Internal Medicine

## 2017-12-01 NOTE — Telephone Encounter (Signed)
Sent in electronically .  

## 2017-12-01 NOTE — Telephone Encounter (Signed)
Last filled 06/15/2017 #30 Last OV 09/12/17 Please advise Dr Fabian Sharp, thanks.

## 2018-03-22 ENCOUNTER — Other Ambulatory Visit: Payer: Self-pay | Admitting: Internal Medicine

## 2018-03-23 MED ORDER — ALPRAZOLAM 0.5 MG PO TABS: ORAL_TABLET | ORAL | 0 refills | Status: DC

## 2018-03-23 NOTE — Telephone Encounter (Signed)
Last refill 12/01/17 Pt is due for CPE  Please advise Dr Fabian Sharp, thanks.

## 2018-03-23 NOTE — Telephone Encounter (Signed)
Sent in electronically . Please  Make yearly OV for her  ( or cpx)

## 2018-04-20 ENCOUNTER — Other Ambulatory Visit: Payer: Self-pay | Admitting: Internal Medicine

## 2018-05-19 ENCOUNTER — Other Ambulatory Visit: Payer: Self-pay | Admitting: Internal Medicine

## 2018-05-21 NOTE — Telephone Encounter (Signed)
Please advise  Last filled :04/20/18 Last OV:09/12/17

## 2018-05-22 NOTE — Telephone Encounter (Signed)
Notified pt she said she will call back and schedule an appt. But it might be a couple

## 2018-05-22 NOTE — Telephone Encounter (Signed)
Have her make   Either cpx or yearly ov mec eval   And then send in refill ( see my notes  Asking for this to be done)

## 2018-05-23 NOTE — Telephone Encounter (Signed)
Pt called and scheduled cpe for 06/04/2018. Pt only has 4 citalopram left. Please send medication to get until appt.  [name] [place], [place] - [address]

## 2018-05-23 NOTE — Telephone Encounter (Signed)
Please advise 

## 2018-06-01 NOTE — Progress Notes (Deleted)
No chief complaint on file.   HPI: Patient  Valerie Love  63 y.o. comes in today for Preventive Health Care visit   Health Maintenance  Topic Date Due  . Hepatitis C Screening  [birth year]  . HIV Screening  [birth year]1970  . MAMMOGRAM  10/28/2016  . PAP SMEAR-Modifier  10/28/2017  . TETANUS/TDAP  08/17/2020  . COLONOSCOPY  01/05/2025  . INFLUENZA VACCINE  Completed   Health Maintenance Review LIFESTYLE:  Exercise:   Tobacco/ETS: Alcohol:  Sugar beverages: Sleep: Drug use: no HH of  Work:    ROS:  GEN/ HEENT: No fever, significant weight changes sweats headaches vision problems hearing changes, CV/ PULM; No chest pain shortness of breath cough, syncope,edema  change in exercise tolerance. GI /GU: No adominal pain, vomiting, change in bowel habits. No blood in the stool. No significant GU symptoms. SKIN/HEME: ,no acute skin rashes suspicious lesions or bleeding. No lymphadenopathy, nodules, masses.  NEURO/ PSYCH:  No neurologic signs such as weakness numbness. No depression anxiety. IMM/ Allergy: No unusual infections.  Allergy .   REST of 12 system review negative except as per HPI   Past Medical History:  Diagnosis Date  . Anxiety   . Arthritis    R knee, back, hands   . Cellulitis of knee 04/2016   RT KNEE  . Childhood asthma   . Complication of anesthesia    woke up slowly - 873yrs. ago  . Depression    related to husband illness and death  . History of jaundice    as a child    . Hx of varicella   . Hyperthyroidism    during pregnancy, treated & resolved post partum   . IBS (irritable bowel syndrome)   . LBBB (left bundle branch block)   . Scoliosis   . Scoliosis 07/2015  . Thyroid disease in pregnancy   . TOBACCO DEPENDENCE 07/06/2006   Qualifier: Diagnosis of  By: Knox Royaltydell, Erin      Past Surgical History:  Procedure Laterality Date  . BREAST BIOPSY Left 80  . BREAST BIOPSY Right 1979   benign   . Cyst removed from ovary    . KNEE SURGERY     right duda   . TONSILLECTOMY AND ADENOIDECTOMY  1073  . TOTAL KNEE ARTHROPLASTY 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   Procedure: TOTAL KNEE ARTHROPLASTY;  Surgeon: Nadara MustardMarcus V Duda, MD;  Location: [facility] OR;  Service: Orthopedics;  Laterality: Right;  . TUBAL LIGATION    . WISDOM TOOTH EXTRACTION      Family History  Problem Relation Age of Onset  . Alcohol abuse Mother        died from stomach ulcers  . Stroke Father        died  from cva and mi  . Hypertension Father   . Hyperlipidemia Father   . Coronary artery disease Father        Age 63  . Colon polyps Sister   . Hepatitis C Brother   . Colon cancer Neg Hx     Social History   Socioeconomic History  . Marital status: Widowed    Spouse name: Not on file  . Number of children: 3  . Years of education: Not on file  . Highest education level: Not on file  Occupational History  . 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 Financial resource strain: Not on file  . Food insecurity:    Worry: Not on file  Inability: Not on file  . Transportation needs:    Medical: Not on file    Non-medical: Not on file  Tobacco Use  . Smoking status: Former Smoker    Packs/day: 0.25    Years: 40.00    Pack years: 10.00    Types: Cigarettes    Last attempt to quit: 01/06/2016    Years since quitting: 2.4  . Smokeless tobacco: Never Used  . Tobacco comment: none in about 1 week  Substance and Sexual Activity  . Alcohol use: Yes    Alcohol/week: 0.0 standard drinks    Comment: socially-wine- daily  . Drug use: No  . Sexual activity: Not on file  Lifestyle  . Physical activity:    Days per week: Not on file    Minutes per session: Not on file  . Stress: Not on file  Relationships  . Social connections:    Talks on phone: Not on file    Gets together: Not on file    Attends religious service: Not on file    Active member of club or organization: Not on file    Attends meetings of clubs or organizations: Not on file     Relationship status: Not on file  Other Topics Concern  . Not on file  Social History Narrative   Husband passed away on 02-29-08 hep c and cirrhosis.   She is hep c negative 2006   G3P3   HH of 2-3  son no pets    No falls .  Has smoke detector and wears seat belts. POsitive  firearms. No excess sun exposure.    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   Had time of nono insuranced and cannot affort 800 per month for catastrophic    Stopped tobacco fall 2017 . ocass etoh.  No rec drugs.   Daughter patient in our practice   Valerie Love    Outpatient Medications Prior to Visit  Medication Sig Dispense Refill  . albuterol (PROAIR HFA) 108 (90 Base) MCG/ACT inhaler Inhale 2 puffs into the lungs every 6 (six) hours as needed for wheezing or shortness of breath. 1 Inhaler 1  . ALPRAZolam (XANAX) 0.5 MG tablet TAKE ONE TABLET BY MOUTH AS NEEDED . MAX DOSAGE ; 3 TABLETS in  24 HOURS. AVOID REGULAR USE. DO NOT TAKE WITH ALCOHOL.OV for further refills 30 tablet 0  . calcium carbonate (OS-CAL) 600 MG TABS tablet Take 600 mg by mouth daily.    . Cholecalciferol (VITAMIN D3 PO) Take 1 capsule by mouth daily.    . citalopram (CELEXA) 20 MG tablet TAKE ONE TABLET BY MOUTH DAILY 30 tablet 0  . dicyclomine (BENTYL) 10 MG capsule Take 1 capsule (10 mg total) by mouth every 6 (six) hours as needed (abdominal spasms). 60 capsule 2  . diphenhydrAMINE (BENADRYL) 25 MG tablet Take 25 mg by mouth at bedtime as needed for sleep.    . Multiple Vitamins-Minerals (ALIVE ONCE DAILY WOMENS 50+) TABS Take 1 tablet by mouth daily.    . naproxen sodium (ANAPROX) 220 MG tablet Take 440 mg by mouth daily.    . predniSONE (DELTASONE) 20 MG tablet Take 1 tablet (20 mg total) by mouth 2 (two) times daily with a meal. 10 tablet 0  . Probiotic Product (TRUBIOTICS) CAPS Take 1 capsule by mouth daily.    . vitamin B-12 (CYANOCOBALAMIN) 500 MCG tablet Take 500 mcg by mouth daily.     No  facility-administered medications prior to visit.      EXAM:  There were no vitals taken for this visit.  There is no height or weight on file to calculate BMI. Wt Readings from Last 3 Encounters:  09/12/17 157 lb 3.2 oz (71.3 kg)  06/15/17 155 lb 12.8 oz (70.7 kg)  07/13/16 158 lb 12.8 oz (72 kg)    Physical Exam: Vital signs reviewed JQB:HALP is a well-developed well-nourished alert cooperative    who appearsr stated age in no acute distress.  HEENT: normocephalic atraumatic , Eyes: PERRL EOM's full, conjunctiva clear, Nares: paten,t no deformity discharge or tenderness., Ears: no deformity EAC's clear TMs with normal landmarks. Mouth: clear OP, no lesions, edema.  Moist mucous membranes. Dentition in adequate repair. NECK: supple without masses, thyromegaly or bruits. CHEST/PULM:  Clear to auscultation and percussion breath sounds equal no wheeze , rales or rhonchi. No chest wall deformities or tenderness. Breast: normal by inspection . No dimpling, discharge, masses, tenderness or discharge . CV: PMI is nondisplaced, S1 S2 no gallops, murmurs, rubs. Peripheral pulses are full without delay.No JVD .  ABDOMEN: Bowel sounds normal nontender  No guard or rebound, no hepato splenomegal no CVA tenderness.  No hernia. Extremtities:  No clubbing cyanosis or edema, no acute joint swelling or redness no focal atrophy NEURO:  Oriented x3, cranial nerves 3-12 appear to be intact, no obvious focal weakness,gait within normal limits no abnormal reflexes or asymmetrical SKIN: No acute rashes normal turgor, color, no bruising or petechiae. PSYCH: Oriented, good eye contact, no obvious depression anxiety, cognition and judgment appear normal. LN: no cervical axillary inguinal adenopathy  Lab Results  Component Value Date   WBC 4.6 07/05/2016   HGB 13.4 07/05/2016   HCT 39.8 07/05/2016   PLT 300.0 07/05/2016   GLUCOSE 93 07/05/2016   CHOL 210 (H) 07/05/2016   TRIG 122.0 07/05/2016   HDL  70.90 07/05/2016   LDLCALC 115 (H) 07/05/2016   ALT 14 07/05/2016   AST 16 07/05/2016   NA 142 07/05/2016   K 4.5 07/05/2016   CL 106 07/05/2016   CREATININE 0.71 07/05/2016   BUN 19 07/05/2016   CO2 28 07/05/2016   TSH 1.53 07/05/2016    BP Readings from Last 3 Encounters:  09/12/17 132/80  06/15/17 120/76  07/13/16 124/70    Lab results reviewed with patient   ASSESSMENT AND PLAN:  Discussed the following assessment and plan:  Visit for preventive health examination  Medication management  LBBB (left bundle branch block)  Patient Care Team: Madelin Headings, MD as PCP - General (Internal Medicine) Lewayne Bunting, MD (Cardiology) Mitchel Honour, DO as Attending Physician (Obstetrics and Gynecology) There are no Patient Instructions on file for this visit.  Neta Mends.  M.D.

## 2018-06-04 ENCOUNTER — Encounter: Payer: Self-pay | Admitting: Internal Medicine

## 2018-06-12 NOTE — Progress Notes (Signed)
Chief Complaint  Patient presents with  . Annual Exam    Pt would like shingles shot. Pt is concern having indestion and is wondering if it from citlopram     HPI: Patient  Valerie Love  63 y.o. comes in today for Preventive Health Care visit  And med management HB more recently better after course of Prilosec but gets after bends over.  Is lactose intolerance and doing better off  Lactose. Citalopram how long to continue seems ok  Refill and alpraz Back issues after working a while    Stage manager Date Due  . HIV Screening  08/04/1970  . MAMMOGRAM  10/28/2016  . PAP SMEAR-Modifier  06/13/2018 (Originally 10/28/2017)  . TETANUS/TDAP  08/17/2020  . COLONOSCOPY  01/05/2025  . INFLUENZA VACCINE  Completed  . Hepatitis C Screening  Completed   Health Maintenance Review LIFESTYLE:  Exercise:  Work out . 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     ROS:  GEN/ HEENT: No fever, significant weight changes sweats headaches vision problems hearing changes, CV/ PULM; No chest pain shortness of breath cough, syncope,edema  change in exercise tolerance. GI /GU: No adominal pain, vomiting, change in bowel habits. No blood in the stool. No significant GU symptoms. SKIN/HEME: ,no acute skin rashes suspicious lesions or bleeding. No lymphadenopathy, nodules, masses.  NEURO/ PSYCH:  No neurologic signs such as weakness numbness. No depression anxiety. IMM/ Allergy: No unusual infections.  Allergy .   REST of 12 system review negative except as per HPI   Past Medical History:  Diagnosis Date  . Anxiety   . Arthritis    R knee, back, hands   . Cellulitis of knee 04/2016   RT KNEE  . Childhood asthma   . Complication of anesthesia    woke up slowly - 15yr. ago  . Depression    related to husband illness and death  . History of  jaundice    as a child    . Hx of varicella   . Hyperthyroidism    during pregnancy, treated & resolved post partum   . IBS (irritable bowel syndrome)   . LBBB (left bundle branch block)   . Scoliosis   . Scoliosis 07/2015  . Thyroid disease in pregnancy   . TOBACCO DEPENDENCE 07/06/2006   Qualifier: Diagnosis of  By: OSamara Snide     Past Surgical History:  Procedure Laterality Date  . BREAST BIOPSY Left 80  . BREAST BIOPSY Right 1979   benign   . Cyst removed from ovary    . KNEE SURGERY     right duda   . TONSILLECTOMY AND ADENOIDECTOMY  1073  . TOTAL KNEE ARTHROPLASTY 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   Procedure: TOTAL KNEE ARTHROPLASTY;  Surgeon: MNewt Minion MD;  [facility]  Service: Orthopedics;  Laterality: Right;  . TUBAL LIGATION    . WISDOM TOOTH EXTRACTION      Family History  Problem Relation Age of Onset  . Alcohol abuse Mother        died from stomach ulcers  . Stroke Father        died  from cva and mi  . Hypertension Father   . Hyperlipidemia Father   . Coronary artery disease Father  Age 67  . Colon polyps Sister   . Hepatitis C Brother   . Colon cancer Neg Hx     Social History   Socioeconomic History  . Marital status: Widowed    Spouse name: Not on file  . Number of children: 3  . Years of education: Not on file  . Highest education level: Not on file  Occupational History  . Occupation: self    Comment: Designer, jewellery  Social Needs  . Financial resource strain: Not on file  . Food insecurity:    Worry: Not on file    Inability: Not on file  . Transportation needs:    Medical: Not on file    Non-medical: Not on file  Tobacco Use  . Smoking status: Former Smoker    Packs/day: 0.25    Years: 40.00    Pack years: 10.00    Types: Cigarettes    Last attempt to quit: 01/06/2016    Years since quitting: 2.4  . Smokeless tobacco: Never Used  . Tobacco comment: none in about 1 week  Substance and Sexual Activity  . Alcohol use:  Yes    Alcohol/week: 0.0 standard drinks    Comment: socially-wine- daily  . Drug use: No  . Sexual activity: Not on file  Lifestyle  . Physical activity:    Days per week: Not on file    Minutes per session: Not on file  . Stress: Not on file  Relationships  . Social connections:    Talks on phone: Not on file    Gets together: Not on file    Attends religious service: Not on file    Active member of club or organization: Not on file    Attends meetings of clubs or organizations: Not on file    Relationship status: Not on file  Other Topics Concern  . Not on file  Social History Narrative   Husband passed away on 02/29/08 hep c and cirrhosis.   She is hep c negative 2006   G3P3   HH of 2-3  son no pets    No falls .  Has smoke detector and wears seat belts. POsitive  firearms. No excess sun exposure.    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   Had time of nono insuranced and cannot affort 800 per month for catastrophic    Stopped tobacco fall 2017 . ocass etoh.  No rec drugs.   Daughter patient in our practice   Sheppard Coil    Outpatient Medications Prior to Visit  Medication Sig Dispense Refill  . albuterol (PROAIR HFA) 108 (90 Base) MCG/ACT inhaler Inhale 2 puffs into the lungs every 6 (six) hours as needed for wheezing or shortness of breath. 1 Inhaler 1  . calcium carbonate (OS-CAL) 600 MG TABS tablet Take 600 mg by mouth daily.    . Cholecalciferol (VITAMIN D3 PO) Take 1 capsule by mouth daily.    Marland Kitchen dicyclomine (BENTYL) 10 MG capsule Take 1 capsule (10 mg total) by mouth every 6 (six) hours as needed (abdominal spasms). 60 capsule 2  . diphenhydrAMINE (BENADRYL) 25 MG tablet Take 25 mg by mouth at bedtime as needed for sleep. prn    . Multiple Vitamins-Minerals (ALIVE ONCE DAILY WOMENS 50+) TABS Take 1 tablet by mouth daily.    . naproxen sodium (ANAPROX) 220 MG tablet Take 440 mg by mouth daily.    . Probiotic Product (TRUBIOTICS) CAPS Take  1  capsule by mouth daily.    Marland Kitchen ALPRAZolam (XANAX) 0.5 MG tablet TAKE ONE TABLET BY MOUTH AS NEEDED . MAX DOSAGE ; 3 TABLETS in  24 HOURS. AVOID REGULAR USE. DO NOT TAKE WITH ALCOHOL.OV for further refills 30 tablet 0  . citalopram (CELEXA) 20 MG tablet TAKE ONE TABLET BY MOUTH DAILY 30 tablet 0  . vitamin B-12 (CYANOCOBALAMIN) 500 MCG tablet Take 500 mcg by mouth daily.    . predniSONE (DELTASONE) 20 MG tablet Take 1 tablet (20 mg total) by mouth 2 (two) times daily with a meal. 10 tablet 0   No facility-administered medications prior to visit.      EXAM:  BP 120/78 (BP Location: Right Arm, Patient Position: Sitting, Cuff Size: Normal)   Pulse 71   Temp 98.2 F (36.8 C) (Oral)   Ht _0  (1.549 m)   Wt 158 lb 4.8 oz (71.8 kg)   BMI 29.91 kg/m   Body mass index is 29.91 kg/m. Wt Readings from Last 3 Encounters:  06/13/18 158 lb 4.8 oz (71.8 kg)  09/12/17 157 lb 3.2 oz (71.3 kg)  06/15/17 155 lb 12.8 oz (70.7 kg)    Physical Exam: Vital signs reviewed NTI:RWER is a well-developed well-nourished alert cooperative    who appearsr stated age in no acute distress.  HEENT: normocephalic atraumatic , Eyes: PERRL EOM's full, conjunctiva clear, Nares: paten,t no deformity discharge or tenderness., Ears: no deformity EAC's clear TMs with normal landmarks. Mouth: clear OP, no lesions, edema.  Moist mucous membranes. Dentition in adequate repair. NECK: supple without masses, thyromegaly or bruits. CHEST/PULM:  Clear to auscultation and percussion breath sounds equal no wheeze , rales or rhonchi. No chest wall deformities or tenderness. Breast: normal by inspection . No dimpling, discharge, masses, tenderness or discharge . CV: PMI is nondisplaced, S1 S2 no gallops, murmurs, rubs. Peripheral pulses are full without delay.No JVD .  ABDOMEN: Bowel sounds normal nontender  No guard or rebound, no hepato splenomegal no CVA tenderness.  No hernia. Extremtities:  No clubbing cyanosis or edema, no  acute joint swelling or redness no focal atrophy NEURO:  Oriented x3, cranial nerves 3-12 appear to be intact, no obvious focal weakness,gait within normal limits no abnormal reflexes or asymmetrical SKIN: No acute rashes normal turgor, color, no bruising or petechiae. PSYCH: Oriented, good eye contact, no obvious depression anxiety, cognition and judgment appear normal. LN: no cervical axillary inguinal adenopathy  Lab Results  Component Value Date   WBC 5.5 06/13/2018   HGB 14.3 06/13/2018   HCT 42.6 06/13/2018   PLT 286.0 06/13/2018   GLUCOSE 83 06/13/2018   CHOL 197 06/13/2018   TRIG 108.0 06/13/2018   HDL 57.50 06/13/2018   LDLCALC 118 (H) 06/13/2018   ALT 15 06/13/2018   AST 18 06/13/2018   NA 141 06/13/2018   K 4.0 06/13/2018   CL 104 06/13/2018   CREATININE 0.66 06/13/2018   BUN 14 06/13/2018   CO2 30 06/13/2018   TSH 1.04 06/13/2018   HGBA1C 5.6 06/13/2018    BP Readings from Last 3 Encounters:  06/13/18 120/78  09/12/17 132/80  06/15/17 120/76    Lab planreviewed with patient   ASSESSMENT AND PLAN:  Discussed the following assessment and plan:  Visit for preventive health examination - Plan: Basic metabolic panel, CBC with Differential/Platelet, Hepatic function panel, Lipid panel, TSH, Hemoglobin A1c  Medication management - Plan: Basic metabolic panel, CBC with Differential/Platelet, Hepatic function panel, Lipid panel, TSH, Hemoglobin A1c  Hyperlipidemia, unspecified hyperlipidemia type - Plan: Basic metabolic panel, CBC with Differential/Platelet, Hepatic function panel, Lipid panel, TSH, Hemoglobin A1c  Gastroesophageal reflux disease, esophagitis presence not specified - Plan: Basic metabolic panel, CBC with Differential/Platelet, Hepatic function panel, Lipid panel, TSH, Hemoglobin A1c  Lactose intolerance - Plan: Basic metabolic panel, CBC with Differential/Platelet, Hepatic function panel, Lipid panel, TSH, Hemoglobin A1c  Screening for diabetes  mellitus - Plan: Basic metabolic panel, CBC with Differential/Platelet, Hepatic function panel, Lipid panel, TSH, Hemoglobin A1c Has been screen hep c   getes mammo and  Pap at gyne  Disc  gerd hb sx and intervention   Patient Care Team: , Standley Brooking, MD as PCP - General (Internal Medicine) Stanford Breed Denice Bors, MD (Cardiology) Linda Hedges, DO as Attending Physician (Obstetrics and Gynecology) Patient Instructions  Continue lifestyle intervention healthy eating and exercise .  Add core resistance exercise .  Diet change and tracking  Can help weight lspos and heartburn sx .   Will notify you  of labs when available.  Cant ry weaning to citalopram 10 mg per day and let us know  If want to wean more   After a month or so .  Future nurse appt for shingrix    Preventive Care 40-64 Years, Female Preventive care refers to lifestyle choices and visits with your health care provider that can promote health and wellness. What does preventive care include?   A yearly physical exam. This is also called an annual well check.  Dental exams once or twice a year.  Routine eye exams. Ask your health care provider how often you should have your eyes checked.  Personal lifestyle choices, including: ? Daily care of your teeth and gums. ? Regular physical activity. ? Eating a healthy diet. ? Avoiding tobacco and drug use. ? Limiting alcohol use. ? Practicing safe sex. ? Taking low-dose aspirin daily starting at age 36. ? Taking vitamin and mineral supplements as recommended by your health care provider. What happens during an annual well check? The services and screenings done by your health care provider during your annual well check will depend on your age, overall health, lifestyle risk factors, and family history of disease. Counseling Your health care provider may ask you questions about your:  Alcohol use.  Tobacco use.  Drug use.  Emotional well-being.  Home and relationship  well-being.  Sexual activity.  Eating habits.  Work and work Statistician.  Method of birth control.  Menstrual cycle.  Pregnancy history. Screening You may have the following tests or measurements:  Height, weight, and BMI.  Blood pressure.  Lipid and cholesterol levels. These may be checked every 5 years, or more frequently if you are over 51 years old.  Skin check.  Lung cancer screening. You may have this screening every year starting at age 40 if you have a 30-pack-year history of smoking and currently smoke or have quit within the past 15 years.  Colorectal cancer screening. All adults should have this screening starting at age 81 and continuing until age 49. Your health care provider may recommend screening at age 60. You will have tests every 1-10 years, depending on your results and the type of screening test. People at increased risk should start screening at an earlier age. Screening tests may include: ? Guaiac-based fecal occult blood testing. ? Fecal immunochemical test (FIT). ? Stool DNA test. ? Virtual colonoscopy. ? Sigmoidoscopy. During this test, a flexible tube with a tiny camera (sigmoidoscope) is used to examine  your rectum and lower colon. The sigmoidoscope is inserted through your anus into your rectum and lower colon. ? Colonoscopy. During this test, a long, thin, flexible tube with a tiny camera (colonoscope) is used to examine your entire colon and rectum.  Hepatitis C blood test.  Hepatitis B blood test.  Sexually transmitted disease (STD) testing.  Diabetes screening. This is done by checking your blood sugar (glucose) after you have not eaten for a while (fasting). You may have this done every 1-3 years.  Mammogram. This may be done every 1-2 years. Talk to your health care provider about when you should start having regular mammograms. This may depend on whether you have a family history of breast cancer.  BRCA-related cancer screening. This  may be done if you have a family history of breast, ovarian, tubal, or peritoneal cancers.  Pelvic exam and Pap test. This may be done every 3 years starting at age 30. Starting at age 92, this may be done every 5 years if you have a Pap test in combination with an HPV test.  Bone density scan. This is done to screen for osteoporosis. You may have this scan if you are at high risk for osteoporosis. Discuss your test results, treatment options, and if necessary, the need for more tests with your health care provider. Vaccines Your health care provider may recommend certain vaccines, such as:  Influenza vaccine. This is recommended every year.  Tetanus, diphtheria, and acellular pertussis (Tdap, Td) vaccine. You may need a Td booster every 10 years.  Varicella vaccine. You may need this if you have not been vaccinated.  Zoster vaccine. You may need this after age 57.  Measles, mumps, and rubella (MMR) vaccine. You may need at least one dose of MMR if you were born in 1957 or later. You may also need a second dose.  Pneumococcal 13-valent conjugate (PCV13) vaccine. You may need this if you have certain conditions and were not previously vaccinated.  Pneumococcal polysaccharide (PPSV23) vaccine. You may need one or two doses if you smoke cigarettes or if you have certain conditions.  Meningococcal vaccine. You may need this if you have certain conditions.  Hepatitis A vaccine. You may need this if you have certain conditions or if you travel or work in places where you may be exposed to hepatitis A.  Hepatitis B vaccine. You may need this if you have certain conditions or if you travel or work in places where you may be exposed to hepatitis B.  Haemophilus influenzae type b (Hib) vaccine. You may need this if you have certain conditions. Talk to your health care provider about which screenings and vaccines you need and how often you need them. This information is not intended to replace  advice given to you by your health care provider. Make sure you discuss any questions you have with your health care provider. Document Released: 05/22/2015 Document Revised: 06/15/2017 Document Reviewed: 02/24/2015 Elsevier Interactive Patient Education  2019 [place] for Gastroesophageal Reflux Disease, Adult When you have gastroesophageal reflux disease (GERD), the foods you eat and your eating habits are very important. Choosing the right foods can help ease the discomfort of GERD. Consider working with a diet and nutrition specialist (dietitian) to help you make healthy food choices. What general guidelines should I follow?  Eating plan  Choose healthy foods low in fat, such as fruits, vegetables, whole grains, low-fat dairy products, and lean meat, fish, and poultry.  Eat  frequent, small meals instead of three large meals each day. Eat your meals slowly, in a relaxed setting. Avoid bending over or lying down until 2-3 hours after eating.  Limit high-fat foods such as fatty meats or fried foods.  Limit your intake of oils, butter, and shortening to less than 8 teaspoons each day.  Avoid the following: ? Foods that cause symptoms. These may be different for different people. Keep a food diary to keep track of foods that cause symptoms. ? Alcohol. ? Drinking large amounts of liquid with meals. ? Eating meals during the 2-3 hours before bed.  Cook foods using methods other than frying. This may include baking, grilling, or broiling. Lifestyle  Maintain a healthy weight. Ask your health care provider what weight is healthy for you. If you need to lose weight, work with your health care provider to do so safely.  Exercise for at least 30 minutes on 5 or more days each week, or as told by your health care provider.  Avoid wearing clothes that fit tightly around your waist and chest.  Do not use any products that contain nicotine or tobacco, such as cigarettes and  e-cigarettes. If you need help quitting, ask your health care provider.  Sleep with the head of your bed raised. Use a wedge under the mattress or blocks under the bed frame to raise the head of the bed. What foods are not recommended? The items listed may not be a complete list. Talk with your dietitian about what dietary choices are best for you. Grains Pastries or quick breads with added fat. [place] toast. Vegetables Deep fried vegetables. [place] fries. Any vegetables prepared with added fat. Any vegetables that cause symptoms. For some people this may include tomatoes and tomato products, chili peppers, onions and garlic, and horseradish. Fruits Any fruits prepared with added fat. Any fruits that cause symptoms. For some people this may include citrus fruits, such as oranges, grapefruit, pineapple, and lemons. Meats and other protein foods High-fat meats, such as fatty beef or pork, hot dogs, ribs, ham, sausage, salami and bacon. Fried meat or protein, including fried fish and fried chicken. Nuts and nut butters. Dairy Whole milk and chocolate milk. Sour cream. Cream. Ice cream. Cream cheese. Milk shakes. Beverages Coffee and tea, with or without caffeine. Carbonated beverages. Sodas. Energy drinks. Fruit juice made with acidic fruits (such as orange or grapefruit). Tomato juice. Alcoholic drinks. Fats and oils Butter. Margarine. Shortening. Ghee. Sweets and desserts Chocolate and cocoa. Donuts. Seasoning and other foods Pepper. Peppermint and spearmint. Any condiments, herbs, or seasonings that cause symptoms. For some people, this may include curry, hot sauce, or vinegar-based salad dressings. Summary  When you have gastroesophageal reflux disease (GERD), food and lifestyle choices are very important to help ease the discomfort of GERD.  Eat frequent, small meals instead of three large meals each day. Eat your meals slowly, in a relaxed setting. Avoid bending over or lying down until  2-3 hours after eating.  Limit high-fat foods such as fatty meat or fried foods. This information is not intended to replace advice given to you by your health care provider. Make sure you discuss any questions you have with your health care provider. Document Released: 04/25/2005 Document Revised: 04/26/2016 Document Reviewed: 04/26/2016 Elsevier Interactive Patient Education  2019 [place].  M.D.

## 2018-06-13 ENCOUNTER — Ambulatory Visit (INDEPENDENT_AMBULATORY_CARE_PROVIDER_SITE_OTHER): Payer: PRIVATE HEALTH INSURANCE | Admitting: Internal Medicine

## 2018-06-13 ENCOUNTER — Encounter: Payer: Self-pay | Admitting: Internal Medicine

## 2018-06-13 VITALS — BP 120/78 | HR 71 | Temp 98.2°F | Ht 61.0 in | Wt 158.3 lb

## 2018-06-13 DIAGNOSIS — E739 Lactose intolerance, unspecified: Secondary | ICD-10-CM

## 2018-06-13 DIAGNOSIS — Z79899 Other long term (current) drug therapy: Secondary | ICD-10-CM

## 2018-06-13 DIAGNOSIS — E785 Hyperlipidemia, unspecified: Secondary | ICD-10-CM | POA: Insufficient documentation

## 2018-06-13 DIAGNOSIS — Z131 Encounter for screening for diabetes mellitus: Secondary | ICD-10-CM

## 2018-06-13 DIAGNOSIS — Z0000 Encounter for general adult medical examination without abnormal findings: Secondary | ICD-10-CM | POA: Diagnosis not present

## 2018-06-13 DIAGNOSIS — K219 Gastro-esophageal reflux disease without esophagitis: Secondary | ICD-10-CM

## 2018-06-13 LAB — TSH: TSH: 1.04 u[IU]/mL (ref 0.35–4.50)

## 2018-06-13 LAB — BASIC METABOLIC PANEL
BUN: 14 mg/dL (ref 6–23)
CHLORIDE: 104 meq/L (ref 96–112)
CO2: 30 meq/L (ref 19–32)
Calcium: 9.4 mg/dL (ref 8.4–10.5)
Creatinine, Ser: 0.66 mg/dL (ref 0.40–1.20)
GFR: 90.49 mL/min (ref >60.00)
GLUCOSE: 83 mg/dL (ref 70–99)
POTASSIUM: 4 meq/L (ref 3.5–5.1)
SODIUM: 141 meq/L (ref 135–145)

## 2018-06-13 LAB — HEMOGLOBIN A1C: HEMOGLOBIN A1C: 5.6 % (ref 4.6–6.5)

## 2018-06-13 LAB — CBC WITH DIFFERENTIAL/PLATELET
Basophils Absolute: 0 10*3/uL (ref 0.0–0.1)
Basophils Relative: 0.3 % (ref 0.0–3.0)
EOS PCT: 2 % (ref 0.0–5.0)
Eosinophils Absolute: 0.1 10*3/uL (ref 0.0–0.7)
HCT: 42.6 % (ref 36.0–46.0)
Hemoglobin: 14.3 g/dL (ref 12.0–15.0)
LYMPHS ABS: 2.2 10*3/uL (ref 0.7–4.0)
Lymphocytes Relative: 39.3 % (ref 12.0–46.0)
MCHC: 33.6 g/dL (ref 30.0–36.0)
MCV: 88.7 fl (ref 78.0–100.0)
MONOS PCT: 7.1 % (ref 3.0–12.0)
Monocytes Absolute: 0.4 10*3/uL (ref 0.1–1.0)
NEUTROS ABS: 2.8 10*3/uL (ref 1.4–7.7)
Neutrophils Relative %: 51.3 % (ref 43.0–77.0)
Platelets: 286 10*3/uL (ref 150.0–400.0)
RBC: 4.8 Mil/uL (ref 3.87–5.11)
RDW: 13.1 % (ref 11.5–15.5)
WBC: 5.5 10*3/uL (ref 4.0–10.5)

## 2018-06-13 LAB — LIPID PANEL
CHOLESTEROL: 197 mg/dL (ref 0–200)
HDL: 57.5 mg/dL (ref >39.00)
LDL Cholesterol: 118 mg/dL — ABNORMAL HIGH (ref 0–99)
NonHDL: 139.57
Total CHOL/HDL Ratio: 3
Triglycerides: 108 mg/dL (ref 0.0–149.0)
VLDL: 21.6 mg/dL (ref 0.0–40.0)

## 2018-06-13 LAB — HEPATIC FUNCTION PANEL
ALBUMIN: 4.4 g/dL (ref 3.5–5.2)
ALT: 15 U/L (ref 0–35)
AST: 18 U/L (ref 0–37)
Alkaline Phosphatase: 64 U/L (ref 39–117)
Bilirubin, Direct: 0.1 mg/dL (ref 0.0–0.3)
Total Bilirubin: 0.6 mg/dL (ref 0.2–1.2)
Total Protein: 6.5 g/dL (ref 6.0–8.3)

## 2018-06-13 MED ORDER — CITALOPRAM HYDROBROMIDE 20 MG PO TABS: 20.0000 mg | ORAL_TABLET | Freq: Every day | ORAL | 3 refills | Status: DC

## 2018-06-13 MED ORDER — ALPRAZOLAM 0.5 MG PO TABS: ORAL_TABLET | ORAL | 0 refills | Status: DC

## 2018-06-13 NOTE — Patient Instructions (Addendum)
Continue lifestyle intervention healthy eating and exercise .  Add core resistance exercise .  Diet change and tracking  Can help weight lspos and heartburn sx .   Will notify you  of labs when available.  Cant ry weaning to citalopram 10 mg per day and let us know  If want to wean more   After a month or so .  Future nurse appt for shingrix    Preventive Care 40-64 Years, Female Preventive care refers to lifestyle choices and visits with your health care provider that can promote health and wellness. What does preventive care include?   A yearly physical exam. This is also called an annual well check.  Dental exams once or twice a year.  Routine eye exams. Ask your health care provider how often you should have your eyes checked.  Personal lifestyle choices, including: ? Daily care of your teeth and gums. ? Regular physical activity. ? Eating a healthy diet. ? Avoiding tobacco and drug use. ? Limiting alcohol use. ? Practicing safe sex. ? Taking low-dose aspirin daily starting at age 74. ? Taking vitamin and mineral supplements as recommended by your health care provider. What happens during an annual well check? The services and screenings done by your health care provider during your annual well check will depend on your age, overall health, lifestyle risk factors, and family history of disease. Counseling Your health care provider may ask you questions about your:  Alcohol use.  Tobacco use.  Drug use.  Emotional well-being.  Home and relationship well-being.  Sexual activity.  Eating habits.  Work and work Statistician.  Method of birth control.  Menstrual cycle.  Pregnancy history. Screening You may have the following tests or measurements:  Height, weight, and BMI.  Blood pressure.  Lipid and cholesterol levels. These may be checked every 5 years, or more frequently if you are over 37 years old.  Skin check.  Lung cancer screening. You may have  this screening every year starting at age 41 if you have a 30-pack-year history of smoking and currently smoke or have quit within the past 15 years.  Colorectal cancer screening. All adults should have this screening starting at age 24 and continuing until age 71. Your health care provider may recommend screening at age 24. You will have tests every 1-10 years, depending on your results and the type of screening test. People at increased risk should start screening at an earlier age. Screening tests may include: ? Guaiac-based fecal occult blood testing. ? Fecal immunochemical test (FIT). ? Stool DNA test. ? Virtual colonoscopy. ? Sigmoidoscopy. During this test, a flexible tube with a tiny camera (sigmoidoscope) is used to examine your rectum and lower colon. The sigmoidoscope is inserted through your anus into your rectum and lower colon. ? Colonoscopy. During this test, a long, thin, flexible tube with a tiny camera (colonoscope) is used to examine your entire colon and rectum.  Hepatitis C blood test.  Hepatitis B blood test.  Sexually transmitted disease (STD) testing.  Diabetes screening. This is done by checking your blood sugar (glucose) after you have not eaten for a while (fasting). You may have this done every 1-3 years.  Mammogram. This may be done every 1-2 years. Talk to your health care provider about when you should start having regular mammograms. This may depend on whether you have a family history of breast cancer.  BRCA-related cancer screening. This may be done if you have a family history of breast,  ovarian, tubal, or peritoneal cancers.  Pelvic exam and Pap test. This may be done every 3 years starting at age 34. Starting at age 94, this may be done every 5 years if you have a Pap test in combination with an HPV test.  Bone density scan. This is done to screen for osteoporosis. You may have this scan if you are at high risk for osteoporosis. Discuss your test results,  treatment options, and if necessary, the need for more tests with your health care provider. Vaccines Your health care provider may recommend certain vaccines, such as:  Influenza vaccine. This is recommended every year.  Tetanus, diphtheria, and acellular pertussis (Tdap, Td) vaccine. You may need a Td booster every 10 years.  Varicella vaccine. You may need this if you have not been vaccinated.  Zoster vaccine. You may need this after age 22.  Measles, mumps, and rubella (MMR) vaccine. You may need at least one dose of MMR if you were born in 1957 or later. You may also need a second dose.  Pneumococcal 13-valent conjugate (PCV13) vaccine. You may need this if you have certain conditions and were not previously vaccinated.  Pneumococcal polysaccharide (PPSV23) vaccine. You may need one or two doses if you smoke cigarettes or if you have certain conditions.  Meningococcal vaccine. You may need this if you have certain conditions.  Hepatitis A vaccine. You may need this if you have certain conditions or if you travel or work in places where you may be exposed to hepatitis A.  Hepatitis B vaccine. You may need this if you have certain conditions or if you travel or work in places where you may be exposed to hepatitis B.  Haemophilus influenzae type b (Hib) vaccine. You may need this if you have certain conditions. Talk to your health care provider about which screenings and vaccines you need and how often you need them. This information is not intended to replace advice given to you by your health care provider. Make sure you discuss any questions you have with your health care provider. Document Released: 05/22/2015 Document Revised: 06/15/2017 Document Reviewed: 02/24/2015 Elsevier Interactive Patient Education  2019 [employer] for Gastroesophageal Reflux Disease, Adult When you have gastroesophageal reflux disease (GERD), the foods you eat and your eating habits  are very important. Choosing the right foods can help ease the discomfort of GERD. Consider working with a diet and nutrition specialist (dietitian) to help you make healthy food choices. What general guidelines should I follow?  Eating plan  Choose healthy foods low in fat, such as fruits, vegetables, whole grains, low-fat dairy products, and lean meat, fish, and poultry.  Eat frequent, small meals instead of three large meals each day. Eat your meals slowly, in a relaxed setting. Avoid bending over or lying down until 2-3 hours after eating.  Limit high-fat foods such as fatty meats or fried foods.  Limit your intake of oils, butter, and shortening to less than 8 teaspoons each day.  Avoid the following: ? Foods that cause symptoms. These may be different for different people. Keep a food diary to keep track of foods that cause symptoms. ? Alcohol. ? Drinking large amounts of liquid with meals. ? Eating meals during the 2-3 hours before bed.  Cook foods using methods other than frying. This may include baking, grilling, or broiling. Lifestyle  Maintain a healthy weight. Ask your health care provider what weight is healthy for you. If you need to  lose weight, work with your health care provider to do so safely.  Exercise for at least 30 minutes on 5 or more days each week, or as told by your health care provider.  Avoid wearing clothes that fit tightly around your waist and chest.  Do not use any products that contain nicotine or tobacco, such as cigarettes and e-cigarettes. If you need help quitting, ask your health care provider.  Sleep with the head of your bed raised. Use a wedge under the mattress or blocks under the bed frame to raise the head of the bed. What foods are not recommended? The items listed may not be a complete list. Talk with your dietitian about what dietary choices are best for you. Grains Pastries or quick breads with added fat. [place]  toast. Vegetables Deep fried vegetables. [place] fries. Any vegetables prepared with added fat. Any vegetables that cause symptoms. For some people this may include tomatoes and tomato products, chili peppers, onions and garlic, and horseradish. Fruits Any fruits prepared with added fat. Any fruits that cause symptoms. For some people this may include citrus fruits, such as oranges, grapefruit, pineapple, and lemons. Meats and other protein foods High-fat meats, such as fatty beef or pork, hot dogs, ribs, ham, sausage, salami and bacon. Fried meat or protein, including fried fish and fried chicken. Nuts and nut butters. Dairy Whole milk and chocolate milk. Sour cream. Cream. Ice cream. Cream cheese. Milk shakes. Beverages Coffee and tea, with or without caffeine. Carbonated beverages. Sodas. Energy drinks. Fruit juice made with acidic fruits (such as orange or grapefruit). Tomato juice. Alcoholic drinks. Fats and oils Butter. Margarine. Shortening. Ghee. Sweets and desserts Chocolate and cocoa. Donuts. Seasoning and other foods Pepper. Peppermint and spearmint. Any condiments, herbs, or seasonings that cause symptoms. For some people, this may include curry, hot sauce, or vinegar-based salad dressings. Summary  When you have gastroesophageal reflux disease (GERD), food and lifestyle choices are very important to help ease the discomfort of GERD.  Eat frequent, small meals instead of three large meals each day. Eat your meals slowly, in a relaxed setting. Avoid bending over or lying down until 2-3 hours after eating.  Limit high-fat foods such as fatty meat or fried foods. This information is not intended to replace advice given to you by your health care provider. Make sure you discuss any questions you have with your health care provider. Document Released: 04/25/2005 Document Revised: 04/26/2016 Document Reviewed: 04/26/2016 Elsevier Interactive Patient Education  2019 [employer].

## 2018-09-13 ENCOUNTER — Other Ambulatory Visit: Payer: Self-pay | Admitting: Internal Medicine

## 2018-09-14 NOTE — Telephone Encounter (Signed)
Sent in electronically .  

## 2018-09-14 NOTE — Telephone Encounter (Signed)
Please advise 

## 2018-09-21 ENCOUNTER — Other Ambulatory Visit: Payer: Self-pay | Admitting: Internal Medicine

## 2018-10-28 ENCOUNTER — Other Ambulatory Visit: Payer: Self-pay | Admitting: Internal Medicine

## 2018-10-30 ENCOUNTER — Ambulatory Visit: Payer: Self-pay | Admitting: Internal Medicine

## 2018-10-30 ENCOUNTER — Inpatient Hospital Stay (HOSPITAL_COMMUNITY)
Admission: EM | Admit: 2018-10-30 | Discharge: 2018-11-01 | DRG: 286 | Disposition: A | Discharge status: Home / Self Care | Payer: PRIVATE HEALTH INSURANCE | Attending: Internal Medicine | Admitting: Internal Medicine

## 2018-10-30 ENCOUNTER — Other Ambulatory Visit: Payer: Self-pay

## 2018-10-30 ENCOUNTER — Encounter (HOSPITAL_COMMUNITY): Payer: Self-pay | Admitting: Emergency Medicine

## 2018-10-30 ENCOUNTER — Emergency Department (HOSPITAL_COMMUNITY): Payer: PRIVATE HEALTH INSURANCE

## 2018-10-30 DIAGNOSIS — I5021 Acute systolic (congestive) heart failure: Secondary | ICD-10-CM

## 2018-10-30 DIAGNOSIS — I248 Other forms of acute ischemic heart disease: Secondary | ICD-10-CM | POA: Diagnosis present

## 2018-10-30 DIAGNOSIS — Z823 Family history of stroke: Secondary | ICD-10-CM | POA: Diagnosis not present

## 2018-10-30 DIAGNOSIS — I447 Left bundle-branch block, unspecified: Secondary | ICD-10-CM | POA: Diagnosis present

## 2018-10-30 DIAGNOSIS — E039 Hypothyroidism, unspecified: Secondary | ICD-10-CM | POA: Diagnosis present

## 2018-10-30 DIAGNOSIS — Z87891 Personal history of nicotine dependence: Secondary | ICD-10-CM

## 2018-10-30 DIAGNOSIS — F329 Major depressive disorder, single episode, unspecified: Secondary | ICD-10-CM | POA: Diagnosis present

## 2018-10-30 DIAGNOSIS — F419 Anxiety disorder, unspecified: Secondary | ICD-10-CM | POA: Diagnosis present

## 2018-10-30 DIAGNOSIS — K589 Irritable bowel syndrome without diarrhea: Secondary | ICD-10-CM | POA: Diagnosis present

## 2018-10-30 DIAGNOSIS — I214 Non-ST elevation (NSTEMI) myocardial infarction: Secondary | ICD-10-CM | POA: Diagnosis present

## 2018-10-30 DIAGNOSIS — R7989 Other specified abnormal findings of blood chemistry: Secondary | ICD-10-CM

## 2018-10-30 DIAGNOSIS — Z20828 Contact with and (suspected) exposure to other viral communicable diseases: Secondary | ICD-10-CM | POA: Diagnosis present

## 2018-10-30 DIAGNOSIS — Z8349 Family history of other endocrine, nutritional and metabolic diseases: Secondary | ICD-10-CM

## 2018-10-30 DIAGNOSIS — I471 Supraventricular tachycardia: Secondary | ICD-10-CM | POA: Diagnosis present

## 2018-10-30 DIAGNOSIS — Z96651 Presence of right artificial knee joint: Secondary | ICD-10-CM | POA: Diagnosis present

## 2018-10-30 DIAGNOSIS — Z8249 Family history of ischemic heart disease and other diseases of the circulatory system: Secondary | ICD-10-CM

## 2018-10-30 DIAGNOSIS — I472 Ventricular tachycardia: Secondary | ICD-10-CM | POA: Diagnosis not present

## 2018-10-30 DIAGNOSIS — E785 Hyperlipidemia, unspecified: Secondary | ICD-10-CM | POA: Diagnosis present

## 2018-10-30 DIAGNOSIS — R002 Palpitations: Secondary | ICD-10-CM | POA: Diagnosis not present

## 2018-10-30 DIAGNOSIS — J45909 Unspecified asthma, uncomplicated: Secondary | ICD-10-CM | POA: Diagnosis present

## 2018-10-30 DIAGNOSIS — R778 Other specified abnormalities of plasma proteins: Secondary | ICD-10-CM

## 2018-10-30 LAB — URINALYSIS, ROUTINE W REFLEX MICROSCOPIC
Bilirubin Urine: NEGATIVE
Glucose, UA: NEGATIVE mg/dL
Hgb urine dipstick: NEGATIVE
Ketones, ur: NEGATIVE mg/dL
Leukocytes,Ua: NEGATIVE
Nitrite: NEGATIVE
Protein, ur: NEGATIVE mg/dL
Specific Gravity, Urine: 1.01 (ref 1.005–1.030)
pH: 6 (ref 5.0–8.0)

## 2018-10-30 LAB — CBC
HCT: 43.3 % (ref 36.0–46.0)
Hemoglobin: 14.3 g/dL (ref 12.0–15.0)
MCH: 30.2 pg (ref 26.0–34.0)
MCHC: 33 g/dL (ref 30.0–36.0)
MCV: 91.4 fL (ref 80.0–100.0)
Platelets: 294 10*3/uL (ref 150–400)
RBC: 4.74 MIL/uL (ref 3.87–5.11)
RDW: 12.5 % (ref 11.5–15.5)
WBC: 8.2 10*3/uL (ref 4.0–10.5)
nRBC: 0 % (ref 0.0–0.2)

## 2018-10-30 LAB — TROPONIN I (HIGH SENSITIVITY)
Troponin I (High Sensitivity): 844 ng/L (ref <18)
Troponin I (High Sensitivity): 1097 ng/L (ref <18)

## 2018-10-30 LAB — SARS CORONAVIRUS 2 BY RT PCR (HOSPITAL ORDER, PERFORMED IN ~~LOC~~ HOSPITAL LAB): SARS Coronavirus 2: NEGATIVE

## 2018-10-30 LAB — BASIC METABOLIC PANEL
Anion gap: 10 (ref 5–15)
BUN: 15 mg/dL (ref 8–23)
CO2: 25 mmol/L (ref 22–32)
Calcium: 9.2 mg/dL (ref 8.9–10.3)
Chloride: 108 mmol/L (ref 98–111)
Creatinine, Ser: 0.82 mg/dL (ref 0.44–1.00)
GFR calc Af Amer: >60 mL/min (ref >60)
GFR calc non Af Amer: >60 mL/min (ref >60)
Glucose, Bld: 137 mg/dL — ABNORMAL HIGH (ref 70–99)
Potassium: 4 mmol/L (ref 3.5–5.1)
Sodium: 143 mmol/L (ref 135–145)

## 2018-10-30 LAB — CBG MONITORING, ED: Glucose-Capillary: 83 mg/dL (ref 70–99)

## 2018-10-30 MED ORDER — METOPROLOL TARTRATE 25 MG PO TABS: 25.0000 mg | ORAL_TABLET | Freq: Two times a day (BID) | ORAL | Status: DC

## 2018-10-30 MED ORDER — SODIUM CHLORIDE 0.9% FLUSH
3.0000 mL | Freq: Once | INTRAVENOUS | Status: AC
  Administered 2018-10-30: 3 mL via INTRAVENOUS

## 2018-10-30 MED ORDER — SODIUM CHLORIDE 0.9% FLUSH: 3.0000 mL | INTRAVENOUS | Status: DC | PRN

## 2018-10-30 MED ORDER — ACETAMINOPHEN 325 MG PO TABS
650.0000 mg | ORAL_TABLET | ORAL | Status: DC | PRN
  Administered 2018-10-31: 650 mg via ORAL
  Filled 2018-10-30: qty 2

## 2018-10-30 MED ORDER — ASPIRIN EC 81 MG PO TBEC: 81.0000 mg | DELAYED_RELEASE_TABLET | Freq: Every day | ORAL | Status: DC

## 2018-10-30 MED ORDER — NITROGLYCERIN 0.4 MG SL SUBL: 0.4000 mg | SUBLINGUAL_TABLET | SUBLINGUAL | Status: DC | PRN

## 2018-10-30 MED ORDER — SODIUM CHLORIDE 0.9% FLUSH
3.0000 mL | Freq: Two times a day (BID) | INTRAVENOUS | Status: DC
  Administered 2018-10-31: 3 mL via INTRAVENOUS

## 2018-10-30 MED ORDER — ASPIRIN 81 MG PO CHEW: 81.0000 mg | CHEWABLE_TABLET | ORAL | Status: DC

## 2018-10-30 MED ORDER — ONDANSETRON HCL 4 MG/2ML IJ SOLN: 4.0000 mg | Freq: Four times a day (QID) | INTRAMUSCULAR | Status: DC | PRN

## 2018-10-30 MED ORDER — ALPRAZOLAM 0.5 MG PO TABS
0.5000 mg | ORAL_TABLET | Freq: Three times a day (TID) | ORAL | Status: DC | PRN
  Administered 2018-10-31: 0.5 mg via ORAL
  Filled 2018-10-30: qty 1

## 2018-10-30 MED ORDER — CITALOPRAM HYDROBROMIDE 20 MG PO TABS: 20.0000 mg | ORAL_TABLET | Freq: Every day | ORAL | Status: DC

## 2018-10-30 MED ORDER — SODIUM CHLORIDE 0.9 % IV SOLN: 250.0000 mL | INTRAVENOUS | Status: DC | PRN

## 2018-10-30 MED ORDER — HEPARIN (PORCINE) 25000 UT/250ML-% IV SOLN
900.0000 [IU]/h | INTRAVENOUS | Status: DC
  Administered 2018-10-30: 750 [IU]/h via INTRAVENOUS
  Filled 2018-10-30: qty 250

## 2018-10-30 MED ORDER — ALBUTEROL SULFATE HFA 108 (90 BASE) MCG/ACT IN AERS
2.0000 | INHALATION_SPRAY | Freq: Four times a day (QID) | RESPIRATORY_TRACT | Status: DC | PRN
  Filled 2018-10-30: qty 6.7

## 2018-10-30 MED ORDER — SODIUM CHLORIDE 0.9 % WEIGHT BASED INFUSION
3.0000 mL/kg/h | INTRAVENOUS | Status: DC
  Administered 2018-10-31: 3 mL/kg/h via INTRAVENOUS

## 2018-10-30 MED ORDER — HEPARIN BOLUS VIA INFUSION
3500.0000 [IU] | Freq: Once | INTRAVENOUS | Status: AC
  Administered 2018-10-30: 3500 [IU] via INTRAVENOUS
  Filled 2018-10-30: qty 3500

## 2018-10-30 MED ORDER — ASPIRIN 81 MG PO CHEW
324.0000 mg | CHEWABLE_TABLET | Freq: Once | ORAL | Status: AC
  Administered 2018-10-30: 324 mg via ORAL
  Filled 2018-10-30: qty 4

## 2018-10-30 MED ORDER — SODIUM CHLORIDE 0.9 % WEIGHT BASED INFUSION
1.0000 mL/kg/h | INTRAVENOUS | Status: DC
  Administered 2018-10-31: 1 mL/kg/h via INTRAVENOUS

## 2018-10-30 MED ORDER — ATORVASTATIN CALCIUM 40 MG PO TABS
40.0000 mg | ORAL_TABLET | Freq: Every day | ORAL | Status: DC
  Administered 2018-10-31: 40 mg via ORAL
  Filled 2018-10-30: qty 1

## 2018-10-30 NOTE — ED Notes (Signed)
Aldean Jewett (daughter# [phone]) would like a call for an update.

## 2018-10-30 NOTE — Progress Notes (Signed)
ANTICOAGULATION CONSULT NOTE - Initial Consult  Pharmacy Consult for heparin Indication: chest pain/ACS  Allergies  Allergen Reactions  . Codeine Itching, Swelling, Rash and Other (See Comments)  . Lactose Intolerance (Gi) Diarrhea    Patient Measurements: Height: 5\' 1"  (154.9 cm) Weight: 160 lb (72.6 kg) IBW/kg (Calculated) : 47.8 HEPARIN DW (KG): 63.6  Vital Signs: Temp: 99.2 F (37.3 C) (06/23 1403) BP: 130/79 (06/23 1928) Pulse Rate: 66 (06/23 1928)  Labs: Recent Labs    10/30/18 1404  HGB 14.3  HCT 43.3  PLT 294  CREATININE 0.82    Estimated Creatinine Clearance: 64 mL/min (by C-G formula based on SCr of 0.82 mg/dL).   Medical History: Past Medical History:  Diagnosis Date  . Anxiety   . Arthritis    R knee, back, hands   . Cellulitis of knee 04/2016   RT KNEE  . Childhood asthma   . Complication of anesthesia    woke up slowly - 81yrs. ago  . Depression    related to husband illness and death  . History of jaundice    as a child    . Hx of varicella   . Hyperthyroidism    during pregnancy, treated & resolved post partum   . IBS (irritable bowel syndrome)   . LBBB (left bundle branch block)   . Scoliosis   . Scoliosis 07/2015  . Thyroid disease in pregnancy   . TOBACCO DEPENDENCE 07/06/2006   Qualifier: Diagnosis of  By: Samara Snide      Assessment: Valerie Love is a 63yo female admitted with chest pain/NSTEMI. Troponins elevated >1000. Pharmacy consulted for heparin dosing. No anticoagulants PTA and CBC is WNL  Goal of Therapy:  Heparin level 0.3-0.7 units/ml Monitor platelets by anticoagulation protocol: Yes   Plan:  Heparin bolus 3500 units x1 now Start heparin infusion at 750 units/hr Heparin level at 0300 Monitor daily heparin level, CBC, s/sx bleeding  Thank you for involving pharmacy in this patient's care.  Janae Bridgeman, PharmD PGY1 Pharmacy Resident Phone: [phone] 10/30/2018 8:01 PM

## 2018-10-30 NOTE — ED Provider Notes (Signed)
  Face-to-face evaluation   History: She presents for evaluation of shortness of breath, with chest and upper back discomfort which occurred while she was patient her usual walking, this morning.  Symptoms resolved with rest.  She has had similar problems several times in the past.  No other recent illnesses.  Physical exam: Alert, calm and cooperative.  Heart regular rate rhythm no murmur lungs clear anteriorly without wheezes rales or rhonchi.  There is no increased work of breathing.  Medical screening examination/treatment/procedure(s) were conducted as a shared visit with non-physician practitioner(s) and myself.  I personally evaluated the patient during the encounter    Daleen Bo, MD 11/01/18 1004

## 2018-10-30 NOTE — H&P (Signed)
Cardiology Admission History and Physical:   Patient ID: Valerie Love MRN: [id number]; DOB: 1956-02-13   Admission date: 10/30/2018  Primary Care Provider: Burnis Medin, MD Primary Cardiologist: Kirk Ruths, MD -2012 Primary Electrophysiologist:  None   Chief Complaint:  NSTEMI  Patient Profile:   Valerie Love is a 63 y.o. female with past medical histoy significant for palpitations, anxiety, depression, hyperthyroidism during pregnancy, IBS, LBBB, hyperlipidemia who presented to the [facility] ED with complaints of irregular heart beat with jaw pain and dizziness.   History of Present Illness:   Valerie Love presented to the [facility] ED with complaints of irregular heart beat with jaw pain and dizziness. She tells me that she usually has episodes of palpitations/fast heart beat that only last for a few minutes, not much more than once a month since her 97s. She was evaluated for palpitations and abnormal EKG in 2012 by Dr. Stanford Breed. Myoview 09/2010 revealed EF 48 and fixed septal defect felt secondary to LBBB. Echocardiogram in May 2012 revealed an ejection fraction of 50%, mild left atrial enlargement, trace mitral and aortic insufficiency. Toprol 12.5 mg was added (but she says she never started it). She declined a heart monitor.   She is active. She usually works out at a gym 5 days per week, riding bike for 30 minutes and doing other exercises for total for 45-60 minutes. She has not been able to go to the gym due to [place] restrictions so she has been walking for about 30 minutes on most days. She does not have any exertional chest discomfort or shortness of breath. Today she was walking out in the heat and suddenly developed a fast heart beat. She had to make her way back home. She then felt her heart beat in her neck and throat that led to an aching in her jaw. She developed chest pressure, lightheadedness, mild shortness of breath, and mild nausea. She got home, took a half a xanax and laid  down. The fast heart beat persisted. She tried different positions but it did not resolve. She called her PCP office and was told to go to the ED. Her symptoms finally resolved as abruptly as they came after about 4 1/2 hours. She was concerned as this was the longest her palpitations had ever lasted.   Her symptoms have not returned.   She does not smoke, quit in 2017 after 40 years of 0.25 packs per day. She drinks 1-2 glasses of wine on most nights.   HS troponin is 844. Will check second one in 2 hours for change.  Chest xray showed no acute process.  EKG shows NSR 93 bpm, LBBB-which has been present since at least 2012.  Renal function normal with SCr 0.82 COVID 19 test in process  Heart Pathway Score:     Past Medical History:  Diagnosis Date   Anxiety    Arthritis    R knee, back, hands    Cellulitis of knee 04/2016   RT KNEE   Childhood asthma    Complication of anesthesia    woke up slowly - 23yrs. ago   Depression    related to husband illness and death   History of jaundice    as a child     Hx of varicella    Hyperthyroidism    during pregnancy, treated & resolved post partum    IBS (irritable bowel syndrome)    LBBB (left bundle branch block)    Scoliosis  Scoliosis 07/2015   Thyroid disease in pregnancy    TOBACCO DEPENDENCE 07/06/2006   Qualifier: Diagnosis of  By: Knox Royaltydell, Erin      Past Surgical History:  Procedure Laterality Date   BREAST BIOPSY Left 80   BREAST BIOPSY Right 1979   benign    Cyst removed from ovary     KNEE SURGERY     right duda    TONSILLECTOMY AND ADENOIDECTOMY  1073   TOTAL KNEE ARTHROPLASTY 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   Procedure: TOTAL KNEE ARTHROPLASTY;  Surgeon: Nadara MustardMarcus V Duda, MD;  Location: [facility] OR;  Service: Orthopedics;  Laterality: Right;   TUBAL LIGATION     WISDOM TOOTH EXTRACTION       Medications Prior to Admission: Prior to Admission medications   Medication Sig Start Date End Date Taking? Authorizing  Provider  ALPRAZolam (XANAX) 0.5 MG tablet TAKE 1 TABLET BY MOUTH AS NEEDED ,MAXIMUM IS 3 TABLETS IN 24 HOURS Patient taking differently: Take 0.5 mg by mouth 3 (three) times daily as needed for anxiety (WITH A MAX OF 3 TABLETS IN 24 HOURS).  10/30/18  Yes Panosh, Neta MendsWanda K, MD  Calcium Carb-Cholecalciferol (CALCIUM+D3) 600-800 MG-UNIT TABS Take 1 tablet by mouth daily with breakfast.   Yes [provider]  citalopram (CELEXA) 20 MG tablet Take 1 tablet (20 mg total) by mouth daily. 06/13/18  Yes Panosh, Neta MendsWanda K, MD  dicyclomine (BENTYL) 10 MG capsule Take 1 capsule (10 mg total) by mouth every 6 (six) hours as needed (abdominal spasms). 11/07/16  Yes Iva BoopGessner, Carl E, MD  diphenhydrAMINE (BENADRYL) 25 MG tablet Take 25 mg by mouth at bedtime as needed for allergies or sleep.    Yes [provider]  Multiple Vitamins-Minerals (ALIVE ONCE DAILY WOMENS 50+) TABS Take 1 tablet by mouth daily.   Yes [provider]  naproxen sodium (ANAPROX) 220 MG tablet Take 440 mg by mouth every morning.    Yes [provider]  PROAIR HFA 108 (90 Base) MCG/ACT inhaler INHALE TWO PUFFS BY MOUTH EVERY 6 HOURS AS NEEDED FOR WHEEZING OR SHORTNESS OF BREATH Patient taking differently: Inhale 2 puffs into the lungs every 6 (six) hours as needed for wheezing or shortness of breath.  09/21/18  Yes Panosh, Neta MendsWanda K, MD  Probiotic Product (TRUBIOTICS) CAPS Take 1 capsule by mouth daily.   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  Social Needs   Financial resource  strain: Not on file   Food insecurity    Worry: Not on file    Inability: Not on file   Transportation needs    Medical: Not on file    Non-medical: Not on file  Tobacco Use   Smoking status: Former Smoker    Packs/day: 0.25    Years: 40.00    Pack years: 10.00    Types: Cigarettes    Quit date: 01/06/2016    Years since quitting: 2.8   Smokeless tobacco: Never Used   Tobacco comment: none in about  1 week  Substance and Sexual Activity   Alcohol use: Yes    Alcohol/week: 0.0 standard drinks    Comment: socially-wine- daily   Drug use: No   Sexual activity: Not on file  Lifestyle   Physical activity    Days per week: Not on file    Minutes per session: Not on file   Stress: Not on file  Relationships   Social connections    Talks on phone: Not on file    Gets together: Not on file    Attends religious service: Not on file    Active member of club or organization: Not on file    Attends meetings of clubs or organizations: Not on file    Relationship status: Not on file   Intimate partner violence    Fear of current or ex partner: Not on file    Emotionally abused: Not on file    Physically abused: Not on file    Forced sexual activity: Not on file  Other Topics Concern   Not on file  Social History Narrative   Husband passed away on 02/11/2008 hep c and cirrhosis.   She is hep c negative 2006   G3P3   HH of 2-3  son no pets    No falls .  Has smoke detector and wears seat belts. POsitive  firearms. No excess sun exposure.    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   Had time of nono insuranced and cannot affort 800 per month for catastrophic    Stopped tobacco fall 2017 . ocass etoh.  No rec drugs.   Daughter patient in our practice   Isabel CapriceAnn myers    Family History:   The patient's family history includes Alcohol abuse in her mother; Colon polyps in her sister; Coronary artery disease in her father; Hepatitis C in her brother;  Hyperlipidemia in her father; Hypertension in her father; Stroke in her father. There is no history of Colon cancer.    ROS:  Please see the history of present illness.  All other ROS reviewed and negative.     Physical Exam/Data:   Vitals:   10/30/18 1403 10/30/18 1601  BP: (!) 154/93   Pulse: 89   Resp: 17   Temp: 99.2 F (37.3 C)   SpO2: 97%   Weight:  72.6 kg  Height:  5\' 1"  (1.549 m)   No intake or output data in the 24 hours ending 10/30/18 1814 Last 3 Weights 10/30/2018 06/13/2018 09/12/2017  Weight (lbs) 160 lb 158 lb 4.8 oz 157 lb 3.2 oz  Weight (kg) 72.576 kg 71.804 kg 71.305 kg     Body mass index is 30.23 kg/m.  General:  Well nourished, well developed, in no acute distress HEENT: normal Lymph: no adenopathy Neck: no JVD Endocrine:  No thryomegaly Vascular: No carotid bruits; FA pulses 2+ bilaterally without bruits  Cardiac:  normal S1, S2; RRR; no murmur  Lungs:  clear to auscultation bilaterally, no wheezing, rhonchi or rales  Abd: soft, nontender, no hepatomegaly  Ext: no edema Musculoskeletal:  No deformities, BUE and BLE strength normal and equal Skin: warm and dry  Neuro:  CNs 2-12 intact, no focal abnormalities noted Psych:  Normal affect    EKG:  The ECG that was done  was personally reviewed and demonstrates NSR 93 bpm, LBBB-which has been present since at least 2012.    Relevant CV Studies:  See HPI.  Laboratory Data:  High Sensitivity Troponin:   Recent Labs  Lab 10/30/18 1628  TROPONINIHS 844*      Cardiac EnzymesNo results for input(s): TROPONINI in the last 168 hours. No results for input(s): TROPIPOC in the last 168 hours.  Chemistry Recent Labs  Lab 10/30/18 1404  NA 143  K 4.0  CL 108  CO2 25  GLUCOSE 137*  BUN 15  CREATININE 0.82  CALCIUM 9.2  GFRNONAA >60  GFRAA >60  ANIONGAP 10    No results for input(s): PROT, ALBUMIN, AST, ALT, ALKPHOS, BILITOT in the last 168 hours. Hematology Recent Labs  Lab 10/30/18 1404    WBC 8.2  RBC 4.74  HGB 14.3  HCT 43.3  MCV 91.4  MCH 30.2  MCHC 33.0  RDW 12.5  PLT 294   BNPNo results for input(s): BNP, PROBNP in the last 168 hours.  DDimer No results for input(s): DDIMER in the last 168 hours.   Radiology/Studies:  Dg Chest Portable 1 View  Result Date: 10/30/2018 CLINICAL DATA:  Chest pain and shortness of breath EXAM: PORTABLE CHEST 1 VIEW COMPARISON:  June 11, 2013 FINDINGS: Lungs are clear. Heart size and pulmonary vascularity are normal. No adenopathy. No pneumothorax. No bone lesions. IMPRESSION: No edema or consolidation. Electronically Signed   By: Bretta BangWilliam  Woodruff III M.D.   On: 10/30/2018 17:02    Assessment and Plan:   1. NSTEMI -Pt with sudden onset of fast heart beat with associated neck fullness and bilat jaw aching, chest pressure, lightheadedness, mild nausea, mild shortness of breath. This lasted for 4.5 hours and then stopped just as suddenly. No symptoms since that time.  -HS troponin elevated at 844. This can certainly indicate myocardial ischemia, but also could be related to myocardial injury from prolonged tachyarrhythmia.  -She does not have significant CVD risk factors except prior smoker, quit in 2017.  -Exam is normal.  -Start heparin drip -Aspirin 324 mg given.  -Will start low beta blocker.  -Plan for cardiac cath tomorrow to definitively evaluate coronary arteries.  -The patient understands that risks include but are not limited to stroke (1 in 1000), death (1 in 1000), kidney failure [usually temporary] (1 in 500), bleeding (1 in 200), allergic reaction [possibly serious] (1 in 200).   -Dr. Rennis GoldenHilty to see pt.   2. Tachy Palpitations -Primary complaint is of fast heart beat that lasted for 4.5 hours with associated symptoms.  -Could be SVT or afib or flutter with RVR.  -Monitor on telemetry. Pt has had 5 beats of tachyarrhythmia while in ED. She has not felt any of the presenting symptoms since being here.  -Pt has hx of  palpitations from 2012.  -Will check TSH. TSh was 1.04 in 06/2018.   3. Hyperlipidemia -LDL was 118 on 06/13/2018 -Will start high intensity statin.   Severity of Illness: The appropriate patient status for this patient is INPATIENT. Inpatient status is judged to be reasonable and necessary in order to provide the required intensity of service to ensure the patient's safety. The patient's presenting symptoms, physical exam findings, and initial radiographic and laboratory data in the context of their chronic comorbidities is felt to place them at high risk for further clinical deterioration. Furthermore, it is not anticipated that the patient will be medically stable for discharge from the hospital within 2 midnights of admission. The following factors support the patient status of inpatient.   " The patient's presenting symptoms include tachy palpitations, chest pressure, lightheadedness. " The worrisome  physical exam findings include none. " The initial radiographic and laboratory data are worrisome because of elevated HS troponin. " The chronic co-morbidities include mild hyperlipidemia, LBBB.   * I certify that at the point of admission it is my clinical judgment that the patient will require inpatient hospital care spanning beyond 2 midnights from the point of admission due to high intensity of service, high risk for further deterioration and high frequency of surveillance required.*    For questions or updates, please contact [facility] Please consult [email or web address] for contact info under        Signed, Berton Bon, NP  10/30/2018 6:14 PM

## 2018-10-30 NOTE — ED Provider Notes (Signed)
[facility] EMERGENCY DEPARTMENT Provider Note   CSN: [id number] Arrival date & time: 10/30/18  1356    History   Chief Complaint Chief Complaint  Patient presents with  . Irregular Heart Beat  . Jaw Pain  . Dizziness    HPI Valerie Love is a 63 y.o. female.     HPI   Pt is a 63 y/o female with history of anxiety, arthritis, childhood asthma, hypothyroidism, LBBB, who presents to the emergency department today for evaluation of an episode of chest pain. Patient states that she was walking for about 20 to 30 minutes around 9 AM this morning.  During her walk she began to experience palpitations, and left-sided chest pressure/heaviness.  States that the pain radiated up to her jaw. She also experienced palpitations and what felt like an irregular heart rhythm.    It was also associated with shortness of breath diaphoresis and nausea.  Denies any vomiting.  States the episode lasted for about 4 hours and it was only when she was in route to the ED that symptoms started to improve.  She currently has no pain or palpitations.  States that she has a history of similar episodes and previously had stress test and echo with cardiology many years ago.  States she is never worn a Holter monitor.  No history of hypertension, hyperlipidemia or diabetes.  Does have history of tobacco use who quit 3 years ago.  Past Medical History:  Diagnosis Date  . Anxiety   . Arthritis    R knee, back, hands   . Cellulitis of knee 04/2016   RT KNEE  . Childhood asthma   . Complication of anesthesia    woke up slowly - 29yrs. ago  . Depression    related to husband illness and death  . History of jaundice    as a child    . Hx of varicella   . Hyperthyroidism    during pregnancy, treated & resolved post partum   . IBS (irritable bowel syndrome)   . LBBB (left bundle branch block)   . Scoliosis   . Scoliosis 07/2015  . Thyroid disease in pregnancy   . TOBACCO DEPENDENCE  07/06/2006   Qualifier: Diagnosis of  By: Knox Royalty      Patient Active Problem List   Diagnosis Date Noted  . NSTEMI (non-ST elevated myocardial infarction) (HCC) 10/30/2018  . Hyperlipidemia 06/13/2018  . Lactose intolerance 06/13/2018  . Cellulitis of right knee 04/18/2016  . 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 Unilateral primary osteoarthritis, right knee   . Special screening for malignant neoplasms, colon 12/29/2014  . Diarrhea 12/29/2014  . Medication management 05/13/2013  . Does not have health insurance 05/13/2013  . Adjustment reaction with anxiety and depression 03/12/2012  . Medication monitoring encounter 03/12/2012  . Hemorrhoids 08/13/2011  . Perineal itching, female 08/13/2011  . LBBB (left bundle branch block) 09/24/2010  . Murmur 09/08/2010  . EKG, abnormal 08/19/2010  . Visit for preventive health examination 08/19/2010  . Rapid palpitations 08/18/2010  . Dizziness 08/18/2010  . Arthritis 08/18/2010  . ANXIETY 06/18/2008  . GRIEF REACTION 06/18/2008  . DEPRESSION, MAJOR, RECURRENT 07/06/2006  . ARTHRALGIA, UNSPECIFIED 07/06/2006    Past Surgical History:  Procedure Laterality Date  . BREAST BIOPSY Left 80  . BREAST BIOPSY Right 1979   benign   . Cyst removed from ovary    . KNEE SURGERY     right duda   .  TONSILLECTOMY AND ADENOIDECTOMY  1073  . TOTAL KNEE ARTHROPLASTY 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   Procedure: TOTAL KNEE ARTHROPLASTY;  Surgeon: Nadara MustardMarcus V Duda, MD;  Location: [facility] OR;  Service: Orthopedics;  Laterality: Right;  . TUBAL LIGATION    . WISDOM TOOTH EXTRACTION       OB History   No obstetric history on file.      Home Medications    Prior to Admission medications   Medication Sig Start Date End Date Taking? Authorizing Provider  ALPRAZolam (XANAX) 0.5 MG tablet TAKE 1 TABLET BY MOUTH AS NEEDED ,MAXIMUM IS 3 TABLETS IN 24 HOURS Patient taking differently: Take 0.5 mg by mouth 3 (three) times daily as needed for anxiety (WITH A  MAX OF 3 TABLETS IN 24 HOURS).  10/30/18  Yes Panosh, Neta MendsWanda K, MD  Calcium Carb-Cholecalciferol (CALCIUM+D3) 600-800 MG-UNIT TABS Take 1 tablet by mouth daily with breakfast.   Yes [provider]  citalopram (CELEXA) 20 MG tablet Take 1 tablet (20 mg total) by mouth daily. 06/13/18  Yes Panosh, Neta MendsWanda K, MD  dicyclomine (BENTYL) 10 MG capsule Take 1 capsule (10 mg total) by mouth every 6 (six) hours as needed (abdominal spasms). 11/07/16  Yes Iva BoopGessner, Carl E, MD  diphenhydrAMINE (BENADRYL) 25 MG tablet Take 25 mg by mouth at bedtime as needed for allergies or sleep.    Yes [provider]  Multiple Vitamins-Minerals (ALIVE ONCE DAILY WOMENS 50+) TABS Take 1 tablet by mouth daily.   Yes [provider]  naproxen sodium (ANAPROX) 220 MG tablet Take 440 mg by mouth every morning.    Yes [provider]  PROAIR HFA 108 (90 Base) MCG/ACT inhaler INHALE TWO PUFFS BY MOUTH EVERY 6 HOURS AS NEEDED FOR WHEEZING OR SHORTNESS OF BREATH Patient taking differently: Inhale 2 puffs into the lungs every 6 (six) hours as needed for wheezing or shortness of breath.  09/21/18  Yes Panosh, Neta MendsWanda K, MD  Probiotic Product (TRUBIOTICS) CAPS Take 1 capsule by mouth daily.   Yes [provider]    Family History Family History  Problem Relation Age of Onset  . Alcohol abuse Mother        died from stomach ulcers  . Stroke Father        died  from cva and mi  . Hypertension Father   . Hyperlipidemia Father   . Coronary artery disease Father        Age 63  . Colon polyps Sister   . Hepatitis C Brother   . Colon cancer Neg Hx     Social History Social History   Tobacco Use  . Smoking status: Former Smoker    Packs/day: 0.25    Years: 40.00    Pack years: 10.00    Types: Cigarettes    Quit date: 01/06/2016    Years since quitting: 2.8  . Smokeless tobacco: Never Used  . Tobacco comment: none in about 1 week  Substance Use Topics  . Alcohol use: Yes    Alcohol/week:  0.0 standard drinks    Comment: socially-wine- daily  . Drug use: No     Allergies   Codeine and Lactose intolerance (gi)   Review of Systems Review of Systems  Constitutional: Positive for diaphoresis. Negative for fever.  HENT: Negative for ear pain and sore throat.   Eyes: Negative for visual disturbance.  Respiratory: Positive for shortness of breath. Negative for cough.   Cardiovascular: Positive for chest pain and palpitations.  Gastrointestinal: Positive for  nausea. Negative for abdominal pain and vomiting.  Genitourinary: Negative for dysuria.  Musculoskeletal: Negative for back pain.  Skin: Negative for rash.  Neurological: Negative for dizziness and light-headedness.  All other systems reviewed and are negative.    Physical Exam Updated Vital Signs BP 130/79 (BP Location: Right Arm)   Pulse 66   Temp 99.2 F (37.3 C)   Resp 17   Ht 5\' 1"  (1.549 m)   Wt 72.6 kg   SpO2 99%   BMI 30.23 kg/m   Physical Exam Vitals signs and nursing note reviewed.  Constitutional:      General: She is not in acute distress.    Appearance: She is well-developed. She is not ill-appearing or toxic-appearing.  HENT:     Head: Normocephalic and atraumatic.  Eyes:     Conjunctiva/sclera: Conjunctivae normal.  Neck:     Musculoskeletal: Neck supple.  Cardiovascular:     Rate and Rhythm: Normal rate and regular rhythm.     Heart sounds: No murmur.  Pulmonary:     Effort: Pulmonary effort is normal. No respiratory distress.     Breath sounds: Normal breath sounds. No wheezing, rhonchi or rales.  Abdominal:     General: Bowel sounds are normal.     Palpations: Abdomen is soft.     Tenderness: There is no abdominal tenderness. There is no guarding or rebound.  Musculoskeletal:        General: No tenderness.     Right lower leg: No edema.     Left lower leg: No edema.  Skin:    General: Skin is warm and dry.  Neurological:     Mental Status: She is alert.      ED  Treatments / Results  Labs (all labs ordered are listed, but only abnormal results are displayed) Labs Reviewed  BASIC METABOLIC PANEL - Abnormal; Notable for the following components:      Result Value   Glucose, Bld 137 (*)    All other components within normal limits  TROPONIN I (HIGH SENSITIVITY) - Abnormal; Notable for the following components:   Troponin I (High Sensitivity) 844 (*)    All other components within normal limits  TROPONIN I (HIGH SENSITIVITY) - Abnormal; Notable for the following components:   Troponin I (High Sensitivity) 1,097 (*)    All other components within normal limits  SARS CORONAVIRUS 2 (HOSPITAL ORDER, PERFORMED IN  HOSPITAL LAB)  CBC  URINALYSIS, ROUTINE W REFLEX MICROSCOPIC  CBC  HEPARIN LEVEL (UNFRACTIONATED)  CBG MONITORING, ED    EKG EKG Interpretation  Date/Time:  Tuesday October 30 2018 14:04:47 EDT Ventricular Rate:  93 PR Interval:  142 QRS Duration: 130 QT Interval:  412 QTC Calculation: 512 R Axis:   76 Text Interpretation:  Normal sinus rhythm Left bundle branch block Abnormal ECG Since last tracing rate faster Confirmed by Mancel BaleWentz, Elliott [phone]([id number]) on 10/30/2018 5:46:20 PM   Radiology Dg Chest Portable 1 View  Result Date: 10/30/2018 CLINICAL DATA:  Chest pain and shortness of breath EXAM: PORTABLE CHEST 1 VIEW COMPARISON:  June 11, 2013 FINDINGS: Lungs are clear. Heart size and pulmonary vascularity are normal. No adenopathy. No pneumothorax. No bone lesions. IMPRESSION: No edema or consolidation. Electronically Signed   By: Bretta BangWilliam  Woodruff III M.D.   On: 10/30/2018 17:02    Procedures Procedures (including critical care time) CRITICAL CARE Performed by: Karrie Meresortni S Pricsilla Lindvall   Total critical care time: 31 minutes  Critical care time was exclusive of  separately billable procedures and treating other patients.  Critical care was necessary to treat or prevent imminent or life-threatening deterioration.  Critical care  was time spent personally by me on the following activities: development of treatment plan with patient and/or surrogate as well as nursing, discussions with consultants, evaluation of patient's response to treatment, examination of patient, obtaining history from patient or surrogate, ordering and performing treatments and interventions, ordering and review of laboratory studies, ordering and review of radiographic studies, pulse oximetry and re-evaluation of patient's condition.   Medications Ordered in ED Medications  heparin ADULT infusion 100 units/mL (25000 units/210mL sodium chloride 0.45%) (has no administration in time range)  heparin bolus via infusion 3,500 Units (has no administration in time range)  sodium chloride flush (NS) 0.9 % injection 3 mL (3 mLs Intravenous Given 10/30/18 1630)  aspirin chewable tablet 324 mg (324 mg Oral Given 10/30/18 1746)     Initial Impression / Assessment and Plan / ED Course  I have reviewed the triage vital signs and the nursing notes.  Pertinent labs & imaging results that were available during my care of the patient were reviewed by me and considered in my medical decision making (see chart for details).     Final Clinical Impressions(s) / ED Diagnoses   Final diagnoses:  NSTEMI (non-ST elevated myocardial infarction) [facility])   Patient is a 63 year old female presenting emergency department today for evaluation of left-sided chest pain/heaviness that occurred prior to arrival.  Associated with palpitations, diaphoresis, nausea and shortness of breath.  Symptoms resolved on arrival.  Vital signs stable on arrival.  Assuring  Labs with normal CBC, BMP, UA.  Troponin elevated at 800.  EKG with left bundle branch block, unchanged from prior.  Chest x-ray is negative for acute abnormality.  Patient given a dose of aspirin.  Cardiology was consulted.  Reassessed patient.  Discussed the results of the laboratory work and plan for admission.  She  is in no acute distress.  She denies any pain currently.  She is in agreement with the plan.  All questions answered.  6:10 PM Dr. Eulis Foster spoke with Dr. Irish Lack with cardiology who will admit the patient for further treatment of NSTEMI.  ED Discharge Orders    None       Bishop Dublin 10/30/18 2007    Daleen Bo, MD 11/01/18 1001

## 2018-10-30 NOTE — ED Triage Notes (Signed)
Pt. Stated, I was walking this morning and started having irregular heart beat with jaw pain and dizziness

## 2018-10-30 NOTE — Telephone Encounter (Signed)
Please advise last visit was in February

## 2018-10-30 NOTE — ED Notes (Signed)
Cards at bedside

## 2018-10-30 NOTE — Telephone Encounter (Signed)
Pt at [facility] ED for evaluation.  

## 2018-10-30 NOTE — Telephone Encounter (Signed)
Pt. Reports she was out walking and developed "palpitations and jaw pain - it has gotten better." Denies chest pain or shortness of breath. States she feels weak. Instructed to have her daughter take her to the ED for evaluation. Verbalizes understanding. Instructed to call EMS if symptoms worsen.  Reason for Disposition . Dizziness, lightheadedness, or weakness  Answer Assessment - Initial Assessment Questions 1. DESCRIPTION: "Please describe your heart rate or heart beat that you are having" (e.g., fast/slow, regular/irregular, skipped or extra beats, "palpitations")     Palpitations 2. ONSET: "When did it start?" (Minutes, hours or days)      Started this morning 3. DURATION: "How long does it last" (e.g., seconds, minutes, hours)     Better 4. PATTERN "Does it come and go, or has it been constant since it started?"  "Does it get worse with exertion?"   "Are you feeling it now?"     It is better 5. TAP: "Using your hand, can you tap out what you are feeling on a chair or table in front of you, so that I can hear?" (Note: not all patients can do this)       No 6. HEART RATE: "Can you tell me your heart rate?" "How many beats in 15 seconds?"  (Note: not all patients can do this)       66 7. RECURRENT SYMPTOM: "Have you ever had this before?" If so, ask: "When was the last time?" and "What happened that time?"      Yes 8. CAUSE: "What do you think is causing the palpitations?"     Unsure 9. CARDIAC HISTORY: "Do you have any history of heart disease?" (e.g., heart attack, angina, bypass surgery, angioplasty, arrhythmia)      BBB 10. OTHER SYMPTOMS: "Do you have any other symptoms?" (e.g., dizziness, chest pain, sweating, difficulty breathing)       Jaw pressure 11. PREGNANCY: "Is there any chance you are pregnant?" "When was your last menstrual period?"       No  Protocols used: HEART RATE AND HEARTBEAT QUESTIONS-A-AH

## 2018-10-30 NOTE — Telephone Encounter (Signed)
Sent in electronically .  

## 2018-10-31 ENCOUNTER — Inpatient Hospital Stay (HOSPITAL_COMMUNITY): Payer: PRIVATE HEALTH INSURANCE

## 2018-10-31 ENCOUNTER — Encounter (HOSPITAL_COMMUNITY): Payer: Self-pay | Admitting: Cardiovascular Disease

## 2018-10-31 ENCOUNTER — Other Ambulatory Visit: Payer: Self-pay | Admitting: Student

## 2018-10-31 ENCOUNTER — Inpatient Hospital Stay (HOSPITAL_COMMUNITY)
Admission: EM | Disposition: A | Discharge status: Home / Self Care | Payer: Self-pay | Source: Home / Self Care | Attending: Internal Medicine

## 2018-10-31 DIAGNOSIS — R002 Palpitations: Secondary | ICD-10-CM

## 2018-10-31 DIAGNOSIS — I214 Non-ST elevation (NSTEMI) myocardial infarction: Secondary | ICD-10-CM

## 2018-10-31 DIAGNOSIS — R778 Other specified abnormalities of plasma proteins: Secondary | ICD-10-CM

## 2018-10-31 DIAGNOSIS — R7989 Other specified abnormal findings of blood chemistry: Secondary | ICD-10-CM

## 2018-10-31 HISTORY — PX: LEFT HEART CATH AND CORONARY ANGIOGRAPHY: CATH118249

## 2018-10-31 LAB — CBC
HCT: 39.2 % (ref 36.0–46.0)
Hemoglobin: 13 g/dL (ref 12.0–15.0)
MCH: 30.3 pg (ref 26.0–34.0)
MCHC: 33.2 g/dL (ref 30.0–36.0)
MCV: 91.4 fL (ref 80.0–100.0)
Platelets: 269 10*3/uL (ref 150–400)
RBC: 4.29 MIL/uL (ref 3.87–5.11)
RDW: 12.7 % (ref 11.5–15.5)
WBC: 7.1 10*3/uL (ref 4.0–10.5)
nRBC: 0 % (ref 0.0–0.2)

## 2018-10-31 LAB — ECHOCARDIOGRAM COMPLETE
Height: 61 in
Weight: 2624.36 oz

## 2018-10-31 LAB — LIPID PANEL
Cholesterol: 192 mg/dL (ref 0–200)
HDL: 63 mg/dL (ref >40)
LDL Cholesterol: 107 mg/dL — ABNORMAL HIGH (ref 0–99)
Total CHOL/HDL Ratio: 3 RATIO
Triglycerides: 110 mg/dL (ref <150)
VLDL: 22 mg/dL (ref 0–40)

## 2018-10-31 LAB — POCT ACTIVATED CLOTTING TIME: Activated Clotting Time: 103 seconds

## 2018-10-31 LAB — HEPARIN LEVEL (UNFRACTIONATED): Heparin Unfractionated: 0.14 IU/mL — ABNORMAL LOW (ref 0.30–0.70)

## 2018-10-31 LAB — HIV ANTIBODY (ROUTINE TESTING W REFLEX): HIV Screen 4th Generation wRfx: NONREACTIVE

## 2018-10-31 LAB — TSH: TSH: 2.647 u[IU]/mL (ref 0.350–4.500)

## 2018-10-31 SURGERY — LEFT HEART CATH AND CORONARY ANGIOGRAPHY
Anesthesia: LOCAL

## 2018-10-31 MED ORDER — LIDOCAINE HCL (PF) 1 % IJ SOLN
INTRAMUSCULAR | Status: AC
  Filled 2018-10-31: qty 30

## 2018-10-31 MED ORDER — ASPIRIN EC 81 MG PO TBEC
81.0000 mg | DELAYED_RELEASE_TABLET | Freq: Every day | ORAL | Status: DC
  Administered 2018-11-01: 81 mg via ORAL
  Filled 2018-10-31: qty 1

## 2018-10-31 MED ORDER — ALBUTEROL SULFATE (2.5 MG/3ML) 0.083% IN NEBU: 2.5000 mg | INHALATION_SOLUTION | Freq: Four times a day (QID) | RESPIRATORY_TRACT | Status: DC | PRN

## 2018-10-31 MED ORDER — TRUBIOTICS PO CAPS: 1.0000 | ORAL_CAPSULE | Freq: Every day | ORAL | Status: DC

## 2018-10-31 MED ORDER — SODIUM CHLORIDE 0.9 % IV SOLN: 250.0000 mL | INTRAVENOUS | Status: DC | PRN

## 2018-10-31 MED ORDER — MIDAZOLAM HCL 2 MG/2ML IJ SOLN
INTRAMUSCULAR | Status: AC
  Filled 2018-10-31: qty 2

## 2018-10-31 MED ORDER — ONDANSETRON HCL 4 MG/2ML IJ SOLN: 4.0000 mg | Freq: Four times a day (QID) | INTRAMUSCULAR | Status: DC | PRN

## 2018-10-31 MED ORDER — LABETALOL HCL 5 MG/ML IV SOLN: 10.0000 mg | INTRAVENOUS | Status: AC | PRN

## 2018-10-31 MED ORDER — CITALOPRAM HYDROBROMIDE 20 MG PO TABS
20.0000 mg | ORAL_TABLET | Freq: Every day | ORAL | Status: DC
  Administered 2018-10-31 – 2018-11-01 (×2): 20 mg via ORAL
  Filled 2018-10-31 (×2): qty 1

## 2018-10-31 MED ORDER — HYDRALAZINE HCL 20 MG/ML IJ SOLN: 10.0000 mg | INTRAMUSCULAR | Status: AC | PRN

## 2018-10-31 MED ORDER — HEPARIN (PORCINE) IN NACL 1000-0.9 UT/500ML-% IV SOLN
INTRAVENOUS | Status: AC
  Filled 2018-10-31: qty 1000

## 2018-10-31 MED ORDER — ACETAMINOPHEN 325 MG PO TABS
650.0000 mg | ORAL_TABLET | ORAL | Status: DC | PRN
  Administered 2018-10-31: 650 mg via ORAL
  Filled 2018-10-31: qty 2

## 2018-10-31 MED ORDER — FENTANYL CITRATE (PF) 100 MCG/2ML IJ SOLN
INTRAMUSCULAR | Status: AC
  Filled 2018-10-31: qty 2

## 2018-10-31 MED ORDER — HEPARIN SODIUM (PORCINE) 1000 UNIT/ML IJ SOLN
INTRAMUSCULAR | Status: AC
  Filled 2018-10-31: qty 1

## 2018-10-31 MED ORDER — MIDAZOLAM HCL 2 MG/2ML IJ SOLN
INTRAMUSCULAR | Status: DC | PRN
  Administered 2018-10-31: 2 mg via INTRAVENOUS

## 2018-10-31 MED ORDER — FENTANYL CITRATE (PF) 100 MCG/2ML IJ SOLN
INTRAMUSCULAR | Status: DC | PRN
  Administered 2018-10-31: 25 ug via INTRAVENOUS

## 2018-10-31 MED ORDER — HEPARIN (PORCINE) IN NACL 1000-0.9 UT/500ML-% IV SOLN
INTRAVENOUS | Status: DC | PRN
  Administered 2018-10-31 (×2): 500 mL

## 2018-10-31 MED ORDER — SODIUM CHLORIDE 0.9 % IV SOLN
INTRAVENOUS | Status: DC
  Administered 2018-10-31: 11:00:00 via INTRAVENOUS

## 2018-10-31 MED ORDER — SODIUM CHLORIDE 0.9% FLUSH: 3.0000 mL | INTRAVENOUS | Status: DC | PRN

## 2018-10-31 MED ORDER — METOPROLOL TARTRATE 25 MG PO TABS
25.0000 mg | ORAL_TABLET | Freq: Two times a day (BID) | ORAL | Status: DC
  Administered 2018-10-31 – 2018-11-01 (×3): 25 mg via ORAL
  Filled 2018-10-31 (×3): qty 1

## 2018-10-31 MED ORDER — DIAZEPAM 5 MG PO TABS: 5.0000 mg | ORAL_TABLET | Freq: Four times a day (QID) | ORAL | Status: DC | PRN

## 2018-10-31 MED ORDER — IOHEXOL 350 MG/ML SOLN
INTRAVENOUS | Status: DC | PRN
  Administered 2018-10-31: 55 mL via INTRA_ARTERIAL

## 2018-10-31 MED ORDER — LIDOCAINE HCL (PF) 1 % IJ SOLN
INTRAMUSCULAR | Status: DC | PRN
  Administered 2018-10-31: 2 mL
  Administered 2018-10-31: 20 mL

## 2018-10-31 MED ORDER — VERAPAMIL HCL 2.5 MG/ML IV SOLN
INTRAVENOUS | Status: AC
  Filled 2018-10-31: qty 2

## 2018-10-31 MED ORDER — SACCHAROMYCES BOULARDII 250 MG PO CAPS
250.0000 mg | ORAL_CAPSULE | Freq: Every day | ORAL | Status: DC
  Administered 2018-10-31 – 2018-11-01 (×2): 250 mg via ORAL
  Filled 2018-10-31 (×2): qty 1

## 2018-10-31 MED ORDER — SODIUM CHLORIDE 0.9% FLUSH
3.0000 mL | Freq: Two times a day (BID) | INTRAVENOUS | Status: DC
  Administered 2018-10-31 – 2018-11-01 (×2): 3 mL via INTRAVENOUS

## 2018-10-31 MED ORDER — ASPIRIN EC 81 MG PO TBEC: 81.0000 mg | DELAYED_RELEASE_TABLET | Freq: Every day | ORAL | Status: DC

## 2018-10-31 SURGICAL SUPPLY — 11 items
BAG SNAP BAND KOVER 36X36 (MISCELLANEOUS) ×2 IMPLANT
CATH INFINITI 5FR MULTPACK ANG (CATHETERS) ×2 IMPLANT
COVER DOME SNAP 22 D (MISCELLANEOUS) ×2 IMPLANT
GLIDESHEATH SLEND SS 6F .021 (SHEATH) ×2 IMPLANT
KIT HEART LEFT (KITS) ×2 IMPLANT
PACK CARDIAC CATHETERIZATION (CUSTOM PROCEDURE TRAY) ×2 IMPLANT
SHEATH PINNACLE 5F 10CM (SHEATH) ×2 IMPLANT
SHEATH PROBE COVER 6X72 (BAG) ×2 IMPLANT
TRANSDUCER W/STOPCOCK (MISCELLANEOUS) ×2 IMPLANT
TUBING CIL FLEX 10 FLL-RA (TUBING) ×2 IMPLANT
WIRE EMERALD 3MM-J .035X150CM (WIRE) ×2 IMPLANT

## 2018-10-31 NOTE — Progress Notes (Signed)
[place] for Heparin Indication: chest pain/ACS  Allergies  Allergen Reactions  . Codeine Itching, Swelling, Rash and Other (See Comments)  . Lactose Intolerance (Gi) Diarrhea    Patient Measurements: Height: 5\' 1"  (154.9 cm) Weight: 160 lb (72.6 kg) IBW/kg (Calculated) : 47.8 HEPARIN DW (KG): 63.6  Vital Signs: BP: 137/74 (06/24 0315) Pulse Rate: 58 (06/24 0315)  Labs: Recent Labs    10/30/18 1404 10/31/18 0250 10/31/18 0251  HGB 14.3  --  13.0  HCT 43.3  --  39.2  PLT 294  --  269  HEPARINUNFRC  --  0.14*  --   CREATININE 0.82  --   --     Estimated Creatinine Clearance: 64 mL/min (by C-G formula based on SCr of 0.82 mg/dL).   Medical History: Past Medical History:  Diagnosis Date  . Anxiety   . Arthritis    R knee, back, hands   . Cellulitis of knee 04/2016   RT KNEE  . Childhood asthma   . Complication of anesthesia    woke up slowly - 51yrs. ago  . Depression    related to husband illness and death  . History of jaundice    as a child    . Hx of varicella   . Hyperthyroidism    during pregnancy, treated & resolved post partum   . IBS (irritable bowel syndrome)   . LBBB (left bundle branch block)   . Scoliosis   . Scoliosis 07/2015  . Thyroid disease in pregnancy   . TOBACCO DEPENDENCE 07/06/2006   Qualifier: Diagnosis of  By: Samara Snide      Assessment: Valerie Love is a 63yo female admitted with chest pain/NSTEMI. High sensitivity troponin is 1097. Pharmacy consulted for heparin dosing. No anticoagulants PTA and CBC is WNL  6/24 AM update:   Initial heparin level is low  Possible cath today  Goal of Therapy:  Heparin level 0.3-0.7 units/ml Monitor platelets by anticoagulation protocol: Yes   Plan:  Inc heparin to 900 units/hr Re-check heparin level in 6-8 hours Monitor daily heparin level, CBC, s/sx bleeding  Narda Bonds, PharmD, BCPS Clinical Pharmacist Phone: [phone]

## 2018-10-31 NOTE — Plan of Care (Signed)

## 2018-10-31 NOTE — Progress Notes (Signed)
  Echocardiogram 2D Echocardiogram has been performed.  Johny Chess 10/31/2018, 3:58 PM

## 2018-10-31 NOTE — Progress Notes (Signed)
Patient had medications in room.  Called daughter- will send medications home with her.   Meds include, Alprozolam 0.5 mg, citralopream 20mg , Dicyclomine 10 mg.

## 2018-10-31 NOTE — Interval H&P Note (Signed)
Cath Lab Visit (complete for each Cath Lab visit)  Clinical Evaluation Leading to the Procedure:   ACS: No.  Non-ACS:    Anginal Classification: CCS II  Anti-ischemic medical therapy: Minimal Therapy (1 class of medications)  Non-Invasive Test Results: No non-invasive testing performed  Prior CABG: No previous CABG      History and Physical Interval Note:  10/31/2018 9:28 AM  Valerie Love  has presented today for surgery, with the diagnosis of NSTEMI.  The various methods of treatment have been discussed with the patient and family. After consideration of risks, benefits and other options for treatment, the patient has consented to  Procedure(s): LEFT HEART CATH AND CORONARY ANGIOGRAPHY (N/A) as a surgical intervention.  The patient's history has been reviewed, patient examined, no change in status, stable for surgery.  I have reviewed the patient's chart and labs.  Questions were answered to the patient's satisfaction.     Shelva Majestic

## 2018-10-31 NOTE — ED Notes (Signed)
Assisted up to bathroom very steady gait. No complaints at present.

## 2018-10-31 NOTE — Progress Notes (Signed)
Noted some bruising and swelling forming under bandage on right radial site.  Extremity warm to touch, no pain or numbness at site.  Will continue to monitor.  Encouraged patient to try to avoid applying a lot of weight to that arm.

## 2018-10-31 NOTE — Progress Notes (Signed)
Progress Note  Patient Name: Valerie Love Date of Encounter: 10/31/2018  Primary Cardiologist: Olga Millers, MD   Subjective   No CP or palp  Inpatient Medications    Scheduled Meds: . [START ON 11/01/2018] aspirin EC  81 mg Oral Daily  . atorvastatin  40 mg Oral q1800  . citalopram  20 mg Oral Daily  . metoprolol tartrate  25 mg Oral BID  . sodium chloride flush  3 mL Intravenous Q12H  . sodium chloride flush  3 mL Intravenous Q12H   Continuous Infusions: . sodium chloride 100 mL/hr at 10/31/18 1100  . sodium chloride     PRN Meds: sodium chloride, acetaminophen, albuterol, ALPRAZolam, diazepam, hydrALAZINE, labetalol, nitroGLYCERIN, ondansetron (ZOFRAN) IV, sodium chloride flush   Vital Signs    Vitals:   10/31/18 1120 10/31/18 1125 10/31/18 1158 10/31/18 1251  BP: 136/66 (!) 143/84 138/70 133/74  Pulse: 64 62 (!) 59 64  Resp: 16 13    Temp:   97.8 F (36.6 C)   TempSrc:   Oral   SpO2: 97% 100% 98% 97%  Weight:   74.4 kg   Height:   5\' 1"  (1.549 m)    No intake or output data in the 24 hours ending 10/31/18 1311 Last 3 Weights 10/31/2018 10/30/2018 06/13/2018  Weight (lbs) 164 lb 0.4 oz 160 lb 158 lb 4.8 oz  Weight (kg) 74.4 kg 72.576 kg 71.804 kg      Telemetry    SR - Personally Reviewed  ECG    NSR with LBBB - Personally Reviewed  Physical Exam   GEN: No acute distress.   Neck: No JVD Cardiac: RRR, no murmurs, rubs, or gallops.  Respiratory: Clear to auscultation bilaterally. GI: Soft, nontender, non-distended  MS: No edema; No deformity. Neuro:  Nonfocal  Psych: Normal affect   Labs    High Sensitivity Troponin:   Recent Labs  Lab 10/30/18 1628 10/30/18 1828  TROPONINIHS 844* 1,097*      Cardiac EnzymesNo results for input(s): TROPONINI in the last 168 hours. No results for input(s): TROPIPOC in the last 168 hours.   Chemistry Recent Labs  Lab 10/30/18 1404  NA 143  K 4.0  CL 108  CO2 25  GLUCOSE 137*  BUN 15  CREATININE  0.82  CALCIUM 9.2  GFRNONAA >60  GFRAA >60  ANIONGAP 10     Hematology Recent Labs  Lab 10/30/18 1404 10/31/18 0251  WBC 8.2 7.1  RBC 4.74 4.29  HGB 14.3 13.0  HCT 43.3 39.2  MCV 91.4 91.4  MCH 30.2 30.3  MCHC 33.0 33.2  RDW 12.5 12.7  PLT 294 269    BNPNo results for input(s): BNP, PROBNP in the last 168 hours.   DDimer No results for input(s): DDIMER in the last 168 hours.   Radiology    Dg Chest Portable 1 View  Result Date: 10/30/2018 CLINICAL DATA:  Chest pain and shortness of breath EXAM: PORTABLE CHEST 1 VIEW COMPARISON:  June 11, 2013 FINDINGS: Lungs are clear. Heart size and pulmonary vascularity are normal. No adenopathy. No pneumothorax. No bone lesions. IMPRESSION: No edema or consolidation. Electronically Signed   By: Bretta Bang III M.D.   On: 10/30/2018 17:02    Cardiac Studies     Patient Profile     63 y.o. female with prior H/O palp and LBBB admidded with 4-5 hours of palp, CP and + trop  Assessment & Plan    1: Tachy palp- None detected  since admission. Trop mildly elevated secondary to demand ischemia. LHC showed clean cors. 2D pending. Could have been SVT/ PAF. Will get EP to see.   For questions or updates, please contact [place] Please consult [email or web address] for contact info under        Signed, Quay Burow, MD  10/31/2018, 1:11 PM

## 2018-10-31 NOTE — Consult Note (Addendum)
ELECTROPHYSIOLOGY CONSULT NOTE    Patient ID: Valerie Love MRN: [id number], DOB/AGE: Jul 31, 1955 63 y.o.  Admit date: 10/30/2018 Date of Consult: 10/31/2018  Primary Physician: Madelin Headings, MD Primary Cardiologist: Olga Millers, MD  Electrophysiologist: New to Dr. Elberta Fortis  Patient Profile: Valerie Love is a 63 y.o. female with a history of palpitations, anxiety, depression, IBS, LBBB, and HLD who is being seen today for the evaluation of palpitations at the request of Dr. Tresa Endo.  HPI:  Valerie Love is a 64 y.o. female who was admitted to [facility] with c/o ~4.5 hrs of palpitations accompanied by jaw pain and dizziness. She intermittently has these episodes, but usually they only last a few minutes. She has them 1-2 times a month since she was in her 30s. Previously evaluated by Dr. Jens Som. Recommended toprol but she did not start it, and declined a heart monitor.   She is very active, usually goes to the gym 4-5 times a week, riding bike for 30 minutes + other activities for a total of 45-60 minutes. She walks 30 minutes most days in the setting of COVID now. Denies exertional CP or SOB.    She was walking in the heat 10/30/2018 when she developed tachypalpitations along with chest pressure, lightheadedness, mild SOB, and mild nausea.  She took half a xanax and laid down without improvement, so came to ED per her PCP. No tachyarrytmias noted here, and her symptoms resolved suddenly.  HS-troponin 844 -> 1097 so taken for LHC this am that showed normal LV function by LV gram and clean coronaries. EP asked to see to further evaluate her palpitations.   Outside of yesterdays episode pt chest pain, dyspnea, PND, orthopnea, nausea, vomiting, dizziness, syncope, edema, weight gain, or early satiety.  Her "usual" palpitations last anywhere from 3 to 30 minutes, and happen 1-2 times a month, to as seldom as every 2-3 months. No family history of cardiac issues. Previously reported 3-4 caffeine  beverages a day. She denies any clear relieving or aggravating factors for her palpitations.   Past Medical History:  Diagnosis Date  . Anxiety   . Arthritis    R knee, back, hands   . Cellulitis of knee 04/2016   RT KNEE  . Childhood asthma   . Complication of anesthesia    woke up slowly - 37yrs. ago  . Depression    related to husband illness and death  . History of jaundice    as a child    . Hx of varicella   . Hyperthyroidism    during pregnancy, treated & resolved post partum   . IBS (irritable bowel syndrome)   . LBBB (left bundle branch block)   . Scoliosis   . Scoliosis 07/2015  . Thyroid disease in pregnancy   . TOBACCO DEPENDENCE 07/06/2006   Qualifier: Diagnosis of  By: Knox Royalty      Surgical History:  Past Surgical History:  Procedure Laterality Date  . BREAST BIOPSY Left 80  . BREAST BIOPSY Right 1979   benign   . Cyst removed from ovary    . KNEE SURGERY     right duda   . TONSILLECTOMY AND ADENOIDECTOMY  1073  . TOTAL KNEE ARTHROPLASTY 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   Procedure: TOTAL KNEE ARTHROPLASTY;  Surgeon: [name], MD;  Location: [facility] OR;  Service: Orthopedics;  Laterality: Right;  . TUBAL LIGATION    . WISDOM TOOTH EXTRACTION       Medications Prior to  Admission  Medication Sig Dispense Refill Last Dose  . ALPRAZolam (XANAX) 0.5 MG tablet TAKE 1 TABLET BY MOUTH AS NEEDED ,MAXIMUM IS 3 TABLETS IN 24 HOURS (Patient taking differently: Take 0.5 mg by mouth 3 (three) times daily as needed for anxiety (WITH A MAX OF 3 TABLETS IN 24 HOURS). ) 30 tablet 0 10/30/2018 at am  . Calcium Carb-Cholecalciferol (CALCIUM+D3) 600-800 MG-UNIT TABS Take 1 tablet by mouth daily with breakfast.   10/29/2018 at Unknown time  . citalopram (CELEXA) 20 MG tablet Take 1 tablet (20 mg total) by mouth daily. 90 tablet 3 10/29/2018 at Unknown time  . dicyclomine (BENTYL) 10 MG capsule Take 1 capsule (10 mg total) by mouth every 6 (six) hours as needed (abdominal spasms). 60 capsule 2  Past Week at Unknown time  . diphenhydrAMINE (BENADRYL) 25 MG tablet Take 25 mg by mouth at bedtime as needed for allergies or sleep.    [facility] at [employer]  . Multiple Vitamins-Minerals (ALIVE ONCE DAILY WOMENS 50+) TABS Take 1 tablet by mouth daily.   10/29/2018 at Unknown time  . naproxen sodium (ANAPROX) 220 MG tablet Take 440 mg by mouth every morning.    10/29/2018 at am  . PROAIR HFA 108 (90 Base) MCG/ACT inhaler INHALE TWO PUFFS BY MOUTH EVERY 6 HOURS AS NEEDED FOR WHEEZING OR SHORTNESS OF BREATH (Patient taking differently: Inhale 2 puffs into the lungs every 6 (six) hours as needed for wheezing or shortness of breath. ) 8.5 g 0 10/30/2018 at am  . Probiotic Product (TRUBIOTICS) CAPS Take 1 capsule by mouth daily.   10/29/2018 at Unknown time    Inpatient Medications:  . [START ON 11/01/2018] aspirin EC  81 mg Oral Daily  . atorvastatin  40 mg Oral q1800  . citalopram  20 mg Oral Daily  . metoprolol tartrate  25 mg Oral BID  . sodium chloride flush  3 mL Intravenous Q12H  . sodium chloride flush  3 mL Intravenous Q12H    Allergies:  Allergies  Allergen Reactions  . Codeine Itching, Swelling, Rash and Other (See Comments)  . Lactose Intolerance (Gi) Diarrhea    Social History   Socioeconomic History  . Marital status: Widowed    Spouse name: Not on file  . Number of children: 3  . Years of education: Not on file  . Highest education level: Not on file  Occupational History  . 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 Financial resource strain: Not on file  . Food insecurity    Worry: Not on file    Inability: Not on file  . Transportation needs    Medical: Not on file    Non-medical: Not on file  Tobacco Use  . Smoking status: Former Smoker    Packs/day: 0.25    Years: 40.00    Pack years: 10.00    Types: Cigarettes    Quit date: 01/06/2016    Years since quitting: 2.8  . Smokeless tobacco: Never Used  . Tobacco comment: none in about 1 week   Substance and Sexual Activity  . Alcohol use: Yes    Alcohol/week: 0.0 standard drinks    Comment: socially-wine- daily  . Drug use: No  . Sexual activity: Not on file  Lifestyle  . Physical activity    Days per week: Not on file    Minutes per session: Not on file  . Stress: Not on file  Relationships  . Social connections  Talks on phone: Not on file    Gets together: Not on file    Attends religious service: Not on file    Active member of club or organization: Not on file    Attends meetings of clubs or organizations: Not on file    Relationship status: Not on file  . Intimate partner violence    Fear of current or ex partner: Not on file    Emotionally abused: Not on file    Physically abused: Not on file    Forced sexual activity: Not on file  Other Topics Concern  . Not on file  Social History Narrative   Husband passed away on 02/11/2008 hep c and cirrhosis.   She is hep c negative 2006   G3P3   HH of 2-3  son no pets    No falls .  Has smoke detector and wears seat belts. POsitive  firearms. No excess sun exposure.    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   Had time of nono insuranced and cannot affort 800 per month for catastrophic    Stopped tobacco fall 2017 . ocass etoh.  No rec drugs.   Daughter patient in our practice   Isabel CapriceAnn myers    Family History  Problem Relation Age of Onset  . Alcohol abuse Mother        died from stomach ulcers  . Stroke Father        died  from cva and mi  . Hypertension Father   . Hyperlipidemia Father   . Coronary artery disease Father        Age 63  . Colon polyps Sister   . Hepatitis C Brother   . Colon cancer Neg Hx     Review of Systems: All other systems reviewed and are otherwise negative except as noted above.  Physical Exam: Vitals:   10/31/18 1115 10/31/18 1120 10/31/18 1125 10/31/18 1158  BP: (!) 135/49 136/66 (!) 143/84 138/70  Pulse:  64 62 (!) 59  Resp:  16 13   Temp:    97.8 F  (36.6 C)  TempSrc:    Oral  SpO2:  97% 100% 98%  Weight:    74.4 kg  Height:    5\' 1"  (1.549 m)    GEN- The patient is well appearing, alert and oriented x 3 today.   HEENT: normocephalic, atraumatic; sclera clear, conjunctiva pink; hearing intact; oropharynx clear; neck supple Lungs- Clear to ausculation bilaterally, normal work of breathing.  No wheezes, rales, rhonchi Heart- Regular rate and rhythm, no murmurs, rubs or gallops GI- soft, non-tender, non-distended, bowel sounds present Extremities- no clubbing, cyanosis, or edema; DP/PT/radial pulses 2+ bilaterally MS- no significant deformity or atrophy Skin- warm and dry, no rash or lesion Psych- euthymic mood, full affect Neuro- strength and sensation are intact  Labs:   Lab Results  Component Value Date   WBC 7.1 10/31/2018   HGB 13.0 10/31/2018   HCT 39.2 10/31/2018   MCV 91.4 10/31/2018   PLT 269 10/31/2018    Recent Labs  Lab 10/30/18 1404  NA 143  K 4.0  CL 108  CO2 25  BUN 15  CREATININE 0.82  CALCIUM 9.2  GLUCOSE 137*   Radiology/Studies: Dg Chest Portable 1 View  Result Date: 10/30/2018 CLINICAL DATA:  Chest pain and shortness of breath EXAM: PORTABLE CHEST 1 VIEW COMPARISON:  June 11, 2013 FINDINGS: Lungs are clear. Heart size and pulmonary  vascularity are normal. No adenopathy. No pneumothorax. No bone lesions. IMPRESSION: No edema or consolidation. Electronically Signed   By: Lowella Grip III M.D.   On: 10/30/2018 17:02   EKG: 10/31/2018 NSR 93 bpm (personally reviewed)  TELEMETRY: NSR 70-80s mostly, occasionally 90s. No tachyarrythmias.  (personally reviewed)  Assessment/Plan: 1.  Palpitations - Agree with BB. Dr. Curt Bears discussed with Dr. Gwenlyn Found. With no documented arrhythmia would recommend event monitor. EP has nothing additional to offer at this time.     Please call with any questions.   For questions or updates, please contact [place] Please consult [email or web address] for contact  info under Cardiology/STEMI.  Signed, Shirley Friar, PA-C  10/31/2018 12:39 PM     I have seen and examined this patient with Oda Kilts.  Agree with above, note added to reflect my findings.  On exam, RRR, no murmurs, lungs clear.  Patient presented with palpitations. No obvious cause seen during this hospitalization. Would continue telemetry until discharge and plan for outpatient 30 day monitor. Also agree with beta blocker at discharge.  Ghadeer Kastelic M. Amori Cooperman MD 10/31/2018 5:38 PM

## 2018-10-31 NOTE — Progress Notes (Signed)
63fr sheath removed from right groin intact with no bleeding or hematoma present. Manual pressure applied per Suella Broad, RN x 74min. 4x4 gauze dressing applied. Post procedure instructions explained; verbalized understanding.Cindee Salt

## 2018-11-01 ENCOUNTER — Telehealth: Payer: Self-pay | Admitting: *Deleted

## 2018-11-01 ENCOUNTER — Other Ambulatory Visit: Payer: Self-pay | Admitting: Cardiology

## 2018-11-01 ENCOUNTER — Encounter (HOSPITAL_COMMUNITY): Payer: Self-pay

## 2018-11-01 DIAGNOSIS — I5021 Acute systolic (congestive) heart failure: Secondary | ICD-10-CM

## 2018-11-01 DIAGNOSIS — I472 Ventricular tachycardia: Secondary | ICD-10-CM

## 2018-11-01 LAB — CBC
HCT: 37.6 % (ref 36.0–46.0)
Hemoglobin: 12.6 g/dL (ref 12.0–15.0)
MCH: 30.4 pg (ref 26.0–34.0)
MCHC: 33.5 g/dL (ref 30.0–36.0)
MCV: 90.8 fL (ref 80.0–100.0)
Platelets: 244 10*3/uL (ref 150–400)
RBC: 4.14 MIL/uL (ref 3.87–5.11)
RDW: 12.3 % (ref 11.5–15.5)
WBC: 5.7 10*3/uL (ref 4.0–10.5)
nRBC: 0 % (ref 0.0–0.2)

## 2018-11-01 MED ORDER — METOPROLOL TARTRATE 25 MG PO TABS: 25.0000 mg | ORAL_TABLET | Freq: Two times a day (BID) | ORAL | 2 refills | Status: DC

## 2018-11-01 MED FILL — Verapamil HCl IV Soln 2.5 MG/ML: INTRAVENOUS | Qty: 2 | Status: AC

## 2018-11-01 MED FILL — METOPROLOL TARTRATE 25 MG T: 25 | 30 days supply | Qty: 60 | Fill #0

## 2018-11-01 NOTE — Telephone Encounter (Signed)
[employer] to ship a 30 day cardiac event monitor to your home.  [employer] telephone number is [phone].  They will call to confirm shipping address prior to sending monitor.  Once received, please review instruction manual, apply monitor, turn monitor and cell phone on, and call [employer] to send your baseline recording.  They will be able to answer any questions regarding monitor instructions at that time.

## 2018-11-01 NOTE — Discharge Summary (Addendum)
Discharge Summary    Patient ID: Valerie Love,  MRN: [id number], DOB/AGE: 1956-03-26 63 y.o.  Admit date: 10/30/2018 Discharge date: 11/01/2018  Primary Care Provider: Madelin Headings Primary Cardiologist: Dr. Lanora Manis   Discharge Diagnoses    Principal Problem:   NSTEMI (non-ST elevated myocardial infarction) [facility]) Active Problems:   Palpitations   Troponin level elevated   Acute systolic heart failure (HCC)   Allergies Allergies  Allergen Reactions  . Codeine Itching, Swelling, Rash and Other (See Comments)  . Lactose Intolerance (Gi) Diarrhea    Diagnostic Studies/Procedures    Cath: 10/31/18   LV end diastolic pressure is normal.   Normal coronary arteries.  Normal LV function with a questionable subtle area of focal distal inferior hypocontractility.  LVEDP 13 mmHg  RECOMMENDATION: Medical therapy for SVT.  As per Dr. Rennis Golden, probable electrophysiology evaluation.  Will obtain 2D echo Doppler study for further evaluation of LV systolic and diastolic function in this patient with left bundle branch block.  TTE: 10/31/18  IMPRESSIONS    1. The left ventricle has mild-moderately reduced systolic function, with an ejection fraction of 40-45%. The cavity size was normal. Left ventricular diastolic Doppler parameters are consistent with impaired relaxation. Left ventricular diffuse  hypokinesis with septal-lateral dyssynchrony consistent with LBBB.  2. No evidence of mitral valve stenosis. Mild mitral regurgitation.  3. The aortic valve is tricuspid. Mild calcification of the aortic valve. Aortic valve regurgitation is trivial by color flow Doppler. No stenosis of the aortic valve.  4. Normal RV size with mildly decreased systolic function.  5. Normal IVC size, PA systolic pressure 24 mmHg. _____________   History of Present Illness     Valerie Love is a 63 y.o. female with past medical histoy significant for palpitations, anxiety, depression,  hyperthyroidism during pregnancy, IBS, LBBB, hyperlipidemia who presented to the [facility] ED with complaints of irregular heart beat with jaw pain and dizziness. She reported that she usually had episodes of palpitations/fast heart beat that only last for a few minutes, not much more than once a month since her 30s. She was evaluated for palpitations and abnormal EKG in 2012 by Dr. Jens Som. Myoview 09/2010 revealed EF 48 and fixed septal defect felt secondary to LBBB. Echocardiogram in May 2012 revealed an ejection fraction of 50%, mild left atrial enlargement, trace mitral and aortic insufficiency. Toprol 12.5 mg was added (but she says she never started it). She declined a heart monitor.   She reported being active. She usually works out at a gym 5 days per week, riding bike for 30 minutes and doing other exercises for total for 45-60 minutes. She had not been able to go to the gym due to COVID restrictions so she had been walking for about 30 minutes on most days. She did not have any exertional chest discomfort or shortness of breath. The day of admission she was walking out in the heat and suddenly developed a fast heart beat. She had to make her way back home. She then felt her heart beat in her neck and throat that led to an aching in her jaw. She developed chest pressure, lightheadedness, mild shortness of breath, and mild nausea. She got home, took a half a xanax and laid down. The fast heart beat persisted. She tried different positions but it did not resolve. She called her PCP office and was told to go to the ED. Her symptoms finally resolved as abruptly as they came after about 4  1/2 hours. She was concerned as this was the longest her palpitations had ever lasted.   She does not smoke, quit in 2017 after 40 years of 0.25 packs per day. She drinks 1-2 glasses of wine on most nights.   HS troponin was 844. Chest xray showed no acute process.  EKG shows NSR 93 bpm, LBBB-which has been present since  at least 2012.  Renal function normal with SCr 0.82 COVID 19 was negative  Given symptoms she was admitted to cardiology service for further work up.    Hospital Course     Consultants: EP  She as placed on IV heparin. hsT peaked at 1097. Underwent cardiac cath noted above with normal coronaries and normal LVEDP. Her etiology of palpitations was uncertain. EP was asked to see the patient. No documented abnormal rhythms were noted at the time. Dr. Girard Cooteramintz agreed to start patient on BB and plan for outpatient cardiac monitor at the time of discharge. Follow up echo showed EF of 40-45% with septal-lateral dyssynchrony consistent with LBBB. No recurrent chest pain. Did note a 17 beat run of NSVT on telemetry once this admission. She tolerated the addition of metoprolol 25mg  BID this admission. Will plan for outpatient 30 day cardiac monitor.   General: Well developed, well nourished, female appearing in no acute distress. Head: Normocephalic, atraumatic.  Neck: Supple without bruits, JVD. Lungs:  Resp regular and unlabored, CTA. Heart: RRR, S1, S2, no S3, S4, or murmur; no rub. Abdomen: Soft, non-tender, non-distended with normoactive bowel sounds. Extremities: No clubbing, cyanosis, edema. Distal pedal pulses are 2+ bilaterally. Right radial/femoral cath site stable with mild bruising but no hematoma Neuro: Alert and oriented X 3. Moves all extremities spontaneously. Psych: Normal affect.  Valerie Love was seen by Dr. Allyson SabalBerry and determined stable for discharge home. Follow up in the office has been arranged. Medications are listed below.   _____________  Discharge Vitals Blood pressure 130/81, pulse 64, temperature 97.6 F (36.4 C), temperature source Oral, resp. rate 18, height 5\' 1"  (1.549 m), weight 72 kg, SpO2 98 %.  Filed Weights   10/30/18 1601 10/31/18 1158 11/01/18 0408  Weight: 72.6 kg 72.2 kg 72 kg    Labs & Radiologic Studies    CBC Recent Labs    10/31/18 0251  11/01/18 0636  WBC 7.1 5.7  HGB 13.0 12.6  HCT 39.2 37.6  MCV 91.4 90.8  PLT 269 244   Basic Metabolic Panel Recent Labs    46/96/2906/23/20 1404  NA 143  K 4.0  CL 108  CO2 25  GLUCOSE 137*  BUN 15  CREATININE 0.82  CALCIUM 9.2   Liver Function Tests No results for input(s): AST, ALT, ALKPHOS, BILITOT, PROT, ALBUMIN in the last 72 hours. No results for input(s): LIPASE, AMYLASE in the last 72 hours. Cardiac Enzymes No results for input(s): CKTOTAL, CKMB, CKMBINDEX, TROPONINI in the last 72 hours. BNP Invalid input(s): POCBNP D-Dimer No results for input(s): DDIMER in the last 72 hours. Hemoglobin A1C No results for input(s): HGBA1C in the last 72 hours. Fasting Lipid Panel Recent Labs    10/31/18 0251  CHOL 192  HDL 63  LDLCALC 107*  TRIG 110  CHOLHDL 3.0   Thyroid Function Tests Recent Labs    10/31/18 0250  TSH 2.647   _____________  Dg Chest Portable 1 View  Result Date: 10/30/2018 CLINICAL DATA:  Chest pain and shortness of breath EXAM: PORTABLE CHEST 1 VIEW COMPARISON:  June 11, 2013 FINDINGS: Lungs  are clear. Heart size and pulmonary vascularity are normal. No adenopathy. No pneumothorax. No bone lesions. IMPRESSION: No edema or consolidation. Electronically Signed   By: Lowella Grip III M.D.   On: 10/30/2018 17:02   Disposition   Pt is being discharged home today in good condition.  Follow-up Plans & Appointments    Follow-up Information    Lendon Colonel, NP Follow up on 11/12/2018.   Specialties: Nurse Practitioner, Radiology, Cardiology Why: at 9:15am for your follow up appt.  Contact information: [address] [place] [address] [phone]          Discharge Instructions    Call MD for:  redness, tenderness, or signs of infection (pain, swelling, redness, odor or green/yellow discharge around incision site)   Complete by: As directed    Diet - low sodium heart healthy   Complete by: As directed    Discharge  instructions   Complete by: As directed    Radial Site Care Refer to this sheet in the next few weeks. These instructions provide you with information on caring for yourself after your procedure. Your caregiver may also give you more specific instructions. Your treatment has been planned according to current medical practices, but problems sometimes occur. Call your caregiver if you have any problems or questions after your procedure. HOME CARE INSTRUCTIONS You may shower the day after the procedure.Remove the bandage (dressing) and gently wash the site with plain soap and water.Gently pat the site dry.  Do not apply powder or lotion to the site.  Do not submerge the affected site in water for 3 to 5 days.  Inspect the site at least twice daily.  Do not flex or bend the affected arm for 24 hours.  No lifting over 5 pounds (2.3 kg) for 5 days after your procedure.  Do not drive home if you are discharged the same day of the procedure. Have someone else drive you.  You may drive 24 hours after the procedure unless otherwise instructed by your caregiver.  What to expect: Any bruising will usually fade within 1 to 2 weeks.  Blood that collects in the tissue (hematoma) may be painful to the touch. It should usually decrease in size and tenderness within 1 to 2 weeks.  SEEK IMMEDIATE MEDICAL CARE IF: You have unusual pain at the radial site.  You have redness, warmth, swelling, or pain at the radial site.  You have drainage (other than a small amount of blood on the dressing).  You have chills.  You have a fever or persistent symptoms for more than 72 hours.  You have a fever and your symptoms suddenly get worse.  Your arm becomes pale, cool, tingly, or numb.  You have heavy bleeding from the site. Hold pressure on the site.   Groin Site Care Refer to this sheet in the next few weeks. These instructions provide you with information on caring for yourself after your procedure. Your caregiver  may also give you more specific instructions. Your treatment has been planned according to current medical practices, but problems sometimes occur. Call your caregiver if you have any problems or questions after your procedure. HOME CARE INSTRUCTIONS You may shower 24 hours after the procedure. Remove the bandage (dressing) and gently wash the site with plain soap and water. Gently pat the site dry.  Do not apply powder or lotion to the site.  Do not sit in a bathtub, swimming pool, or whirlpool for 5 to 7  days.  No bending, squatting, or lifting anything over 10 pounds (4.5 kg) as directed by your caregiver.  Inspect the site at least twice daily.  Do not drive home if you are discharged the same day of the procedure. Have someone else drive you.  You may drive 24 hours after the procedure unless otherwise instructed by your caregiver.  What to expect: Any bruising will usually fade within 1 to 2 weeks.  Blood that collects in the tissue (hematoma) may be painful to the touch. It should usually decrease in size and tenderness within 1 to 2 weeks.  SEEK IMMEDIATE MEDICAL CARE IF: You have unusual pain at the groin site or down the affected leg.  You have redness, warmth, swelling, or pain at the groin site.  You have drainage (other than a small amount of blood on the dressing).  You have chills.  You have a fever or persistent symptoms for more than 72 hours.  You have a fever and your symptoms suddenly get worse.  Your leg becomes pale, cool, tingly, or numb.  You have heavy bleeding from the site. Hold pressure on the site. .   Increase activity slowly   Complete by: As directed      Discharge Medications     Medication List    TAKE these medications   Alive Once Daily Womens 50+ Tabs Take 1 tablet by mouth daily.   ALPRAZolam 0.5 MG tablet Commonly known as: XANAX TAKE 1 TABLET BY MOUTH AS NEEDED ,MAXIMUM IS 3 TABLETS IN 24 HOURS What changed:   how much to take  how to  take this  when to take this  reasons to take this  additional instructions   Calcium+D3 600-800 MG-UNIT Tabs Generic drug: Calcium Carb-Cholecalciferol Take 1 tablet by mouth daily with breakfast.   citalopram 20 MG tablet Commonly known as: CELEXA Take 1 tablet (20 mg total) by mouth daily.   dicyclomine 10 MG capsule Commonly known as: Bentyl Take 1 capsule (10 mg total) by mouth every 6 (six) hours as needed (abdominal spasms).   diphenhydrAMINE 25 MG tablet Commonly known as: BENADRYL Take 25 mg by mouth at bedtime as needed for allergies or sleep.   metoprolol tartrate 25 MG tablet Commonly known as: LOPRESSOR Take 1 tablet (25 mg total) by mouth 2 (two) times daily.   naproxen sodium 220 MG tablet Commonly known as: ALEVE Take 440 mg by mouth every morning.   ProAir HFA 108 (90 Base) MCG/ACT inhaler Generic drug: albuterol INHALE TWO PUFFS BY MOUTH EVERY 6 HOURS AS NEEDED FOR WHEEZING OR SHORTNESS OF BREATH What changed: See the new instructions.   TruBiotics Caps Take 1 capsule by mouth daily.       Acute coronary syndrome (MI, NSTEMI, STEMI, etc) this admission?:  No.  The elevated Troponin was due to the acute medical illness or demand ischemia.   Outstanding Labs/Studies   Cardiac event monitor.   Duration of Discharge Encounter   Greater than 30 minutes including physician time.  Signed, Laverda PageLindsay Roberts NP-C 11/01/2018, 10:32 AM  Agree with note by Laverda PageLindsay Roberts NP-C  I saw Valerie Love this morning.  She had a heart cath yesterday revealing normal coronary arteries.  Her LV function is mild to moderately depressed similar to previous echoes.  She does have chronic left bundle branch block.  She had 17 beats of nonsustained ventricular tachycardia this morning.  Not clear if that was her initial rate rhythm that brought her into  the hospital and was responsible for the non-STEMI related to demand ischemia.  Agree with a 30-day event monitor and  beginning low-dose beta-blockade.  Her exam is benign.  TOC 7 then follow-up with me in 4 to 6 weeks.   Runell GessJonathan J. Seung Nidiffer, M.D., FACP, [facility] Mans[facility] Medical CenterFACC, Earl LagosFAHA, [facility] DivisionFSCAI [facility] [address] [place], [place]  [id number]  [phone] 11/01/2018 11:19 AM

## 2018-11-07 ENCOUNTER — Telehealth: Payer: Self-pay | Admitting: Internal Medicine

## 2018-11-07 NOTE — Telephone Encounter (Signed)
New Message   Patient is calling because she would like to know can she start driving now. Please call to discuss.

## 2018-11-07 NOTE — Telephone Encounter (Signed)
Spoke to patient, she has  Not had any episodes-  Per  Discharge info , may drive after 24 hours. - patient can drive on July 6 ,4132  patient aware of face covering and no visitor for appointment

## 2018-11-08 ENCOUNTER — Telehealth: Payer: Self-pay | Admitting: Adult Health

## 2018-11-08 ENCOUNTER — Ambulatory Visit (INDEPENDENT_AMBULATORY_CARE_PROVIDER_SITE_OTHER): Payer: PRIVATE HEALTH INSURANCE

## 2018-11-08 DIAGNOSIS — R002 Palpitations: Secondary | ICD-10-CM

## 2018-11-08 NOTE — Telephone Encounter (Signed)

## 2018-11-10 NOTE — Progress Notes (Addendum)
Cardiology Office Note   Date:  11/12/2018   ID:  Valerie Love, Valerie Love [birth year], MRN [id number]  PCP:  Burnis Medin, MD  Cardiologist:  Lubertha South , Dr. Gwenlyn Found Chief Complaint: Post hospital follow up   History of Present Illness: Valerie Love is a 63 y.o. female who presents for post hospital follow up after admission for NSTEMI.She presented to the ED with rapid HR and aching in her jaw and neck and throat lasting for over 4 hours.   She required cardiac revealing normal coronaries and normal LVEDP. She was started on metoprolol 25 mg BID and planned for OP cardiac monitor. Echo revealed EF of 40%-45% with septal-lateral dyssynchrony.   She has other history of palpitations, anxiety, depression, hyperthyroidism during pregnancy, IBS, LBBB, and hyperlipidemia.  She states today that she feels much better.  She states that since going home she has only had a couple episodes where she notices her heart beating hard.  These episodes last approximately 5 to 10 minutes at varying times through the day.  She states she feels that the metoprolol medication is doing what it should.  She has been able to walk around her house and water her garden/flowers without symptoms.  Also, today she asks if she would be able to start exercising and go back to work.  She denies chest pain, shortness of breath, presyncope, syncope, melena, hematuria, hemoptysis, orthopnea, increased palpitations, lower extremity edema, and PND.  Past Medical History:  Diagnosis Date  . Anxiety   . Arthritis    R knee, back, hands   . Cellulitis of knee 04/2016   RT KNEE  . Childhood asthma   . Complication of anesthesia    woke up slowly - 56yrs. ago  . Depression    related to husband illness and death  . History of jaundice    as a child    . Hx of varicella   . Hyperthyroidism    during pregnancy, treated & resolved post partum   . IBS (irritable bowel syndrome)   . LBBB (left bundle branch block)   .  Scoliosis   . Scoliosis 07/2015  . Thyroid disease in pregnancy   . TOBACCO DEPENDENCE 07/06/2006   Qualifier: Diagnosis of  By: Samara Snide      Past Surgical History:  Procedure Laterality Date  . BREAST BIOPSY Left 80  . BREAST BIOPSY Right 1979   benign   . Cyst removed from ovary    . KNEE SURGERY     right duda   . LEFT HEART CATH AND CORONARY ANGIOGRAPHY N/A 10/31/2018   Procedure: LEFT HEART CATH AND CORONARY ANGIOGRAPHY;  Surgeon: Troy Sine, MD;  Location: [facility];  Service: Cardiovascular;  Laterality: N/A;  . TONSILLECTOMY AND ADENOIDECTOMY  1073  . TOTAL KNEE ARTHROPLASTY 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   Procedure: TOTAL KNEE ARTHROPLASTY;  Surgeon: Newt Minion, MD;  Location: [facility];  Service: Orthopedics;  Laterality: Right;  . TUBAL LIGATION    . WISDOM TOOTH EXTRACTION       Current Outpatient Medications  Medication Sig Dispense Refill  . ALPRAZolam (XANAX) 0.5 MG tablet TAKE 1 TABLET BY MOUTH AS NEEDED ,MAXIMUM IS 3 TABLETS IN 24 HOURS (Patient taking differently: Take 0.5 mg by mouth 3 (three) times daily as needed for anxiety (WITH A MAX OF 3 TABLETS IN 24 HOURS). ) 30 tablet 0  . Calcium Carb-Cholecalciferol (CALCIUM+D3) 600-800 MG-UNIT TABS Take 1 tablet by mouth daily with  breakfast.    . citalopram (CELEXA) 20 MG tablet Take 1 tablet (20 mg total) by mouth daily. 90 tablet 3  . dicyclomine (BENTYL) 10 MG capsule Take 1 capsule (10 mg total) by mouth every 6 (six) hours as needed (abdominal spasms). 60 capsule 2  . diphenhydrAMINE (BENADRYL) 25 MG tablet Take 25 mg by mouth at bedtime as needed for allergies or sleep.     . metoprolol tartrate (LOPRESSOR) 25 MG tablet Take 1 tablet (25 mg total) by mouth 2 (two) times daily. 60 tablet 2  . Multiple Vitamins-Minerals (ALIVE ONCE DAILY WOMENS 50+) TABS Take 1 tablet by mouth daily.    . naproxen sodium (ANAPROX) 220 MG tablet Take 440 mg by mouth every morning.     Marland Kitchen PROAIR HFA 108 (90 Base) MCG/ACT inhaler  INHALE TWO PUFFS BY MOUTH EVERY 6 HOURS AS NEEDED FOR WHEEZING OR SHORTNESS OF BREATH (Patient taking differently: Inhale 2 puffs into the lungs every 6 (six) hours as needed for wheezing or shortness of breath. ) 8.5 g 0  . Probiotic Product (TRUBIOTICS) CAPS Take 1 capsule by mouth daily.     No current facility-administered medications for this visit.     Allergies:   Codeine and Lactose intolerance (gi)    Social History:  The patient  reports that she quit smoking about 2 years ago. Her smoking use included cigarettes. She has a 10.00 pack-year smoking history. She has never used smokeless tobacco. She reports current alcohol use. She reports that she does not use drugs.   Family History:  The patient's family history includes Alcohol abuse in her mother; Colon polyps in her sister; Coronary artery disease in her father; Hepatitis C in her brother; Hyperlipidemia in her father; Hypertension in her father; Stroke in her father.    ROS: All other systems are reviewed and negative. Unless otherwise mentioned in H&P    PHYSICAL EXAM: VS:  BP 130/72   Pulse 63   Temp (!) 97.2 F (36.2 C)   Ht 5\' 1"  (1.549 m)   Wt 159 lb (72.1 kg)   SpO2 98%   BMI 30.04 kg/m  , BMI Body mass index is 30.04 kg/m. GEN: Well nourished, well developed, in no acute distress HEENT: normal Neck: no JVD, carotid bruits, or masses Cardiac: RRR; no murmurs, rubs, or gallops,no edema  Respiratory:  Clear to auscultation bilaterally, normal work of breathing GI: soft, nontender, nondistended, + BS MS: no deformity or atrophy Skin: warm and dry, no rash Neuro:  Strength and sensation are intact Psych: euthymic mood, full affect   EKG:  EKG is ordered today. The ekg ordered today demonstrates sinus bradycardia left bundle branch block, no acute changes, 51 bpm.   Recent Labs: 06/13/2018: ALT 15 10/30/2018: BUN 15; Creatinine, Ser 0.82; Potassium 4.0; Sodium 143 10/31/2018: TSH 2.647 11/01/2018: Hemoglobin  12.6; Platelets 244    Lipid Panel    Component Value Date/Time   CHOL 192 10/31/2018 0251   TRIG 110 10/31/2018 0251   HDL 63 10/31/2018 0251   CHOLHDL 3.0 10/31/2018 0251   VLDL 22 10/31/2018 0251   LDLCALC 107 (H) 10/31/2018 0251      Wt Readings from Last 3 Encounters:  11/12/18 159 lb (72.1 kg)  11/01/18 158 lb 11.2 oz (72 kg)  06/13/18 158 lb 4.8 oz (71.8 kg)      Other studies Reviewed: TTE: 10/31/18  IMPRESSIONS  1. The left ventricle has mild-moderately reduced systolic function, with an ejection fraction  of 40-45%. The cavity size was normal. Left ventricular diastolic Doppler parameters are consistent with impaired relaxation. Left ventricular diffuse  hypokinesis with septal-lateral dyssynchrony consistent with LBBB. 2. No evidence of mitral valve stenosis. Mild mitral regurgitation. 3. The aortic valve is tricuspid. Mild calcification of the aortic valve. Aortic valve regurgitation is trivial by color flow Doppler. No stenosis of the aortic valve. 4. Normal RV size with mildly decreased systolic function. 5. Normal IVC size, PA systolic pressure 24 mmHg.  Cardiac Cath 10/31/2018  LV end diastolic pressure is normal.   Normal coronary arteries.  Normal LV function with a questionable subtle area of focal distal inferior hypocontractility.  LVEDP 13 mmHg  RECOMMENDATION: Medical therapy for SVT.  As per Dr. Rennis GoldenHilty, probable electrophysiology evaluation.  Will obtain 2D echo Doppler study for further evaluation of LV systolic and diastolic function in this patient with left bundle branch block.   ASSESSMENT AND PLAN:  1.  Palpitations- none today Continue event monitor for 30 days. Continue metoprolol tartrate 25 mg twice daily. 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  She was instructed to increase her work/physical activity at a controlled pace. Continue  heart healthy low-sodium diet Reviewed echocardiogram results Reviewed EKG (11/12/2018) Educated about SVT and event monitor.   Disposition: Follow-up with Dr. Allyson SabalBerry or APP in 2 months.  She wishes she is to follow-up with Dr. Allyson SabalBerry or APP.  She feels that she has a better connection/rapport with him.  She was very pleasant and concerned about her SVT during this visit.   Current medicines are reviewed at length with the patient today.    Labs/ tests ordered today include: BMP and magnesium Edd FabianJesse Laryssa Hassing NP-C    11/12/2018 12:03 PM    [facility] [address] Office [phone] Fax [phone]

## 2018-11-12 ENCOUNTER — Encounter: Payer: Self-pay | Admitting: Adult Health

## 2018-11-12 ENCOUNTER — Other Ambulatory Visit: Payer: Self-pay

## 2018-11-12 ENCOUNTER — Ambulatory Visit (INDEPENDENT_AMBULATORY_CARE_PROVIDER_SITE_OTHER): Payer: PRIVATE HEALTH INSURANCE | Admitting: Adult Health

## 2018-11-12 VITALS — BP 130/72 | HR 63 | Temp 97.2°F | Ht 61.0 in | Wt 159.0 lb

## 2018-11-12 DIAGNOSIS — R002 Palpitations: Secondary | ICD-10-CM

## 2018-11-12 DIAGNOSIS — Z79899 Other long term (current) drug therapy: Secondary | ICD-10-CM

## 2018-11-12 LAB — BASIC METABOLIC PANEL
BUN/Creatinine Ratio: 26 (ref 12–28)
BUN: 18 mg/dL (ref 8–27)
CO2: 27 mmol/L (ref 20–29)
Calcium: 10.1 mg/dL (ref 8.7–10.3)
Chloride: 103 mmol/L (ref 96–106)
Creatinine, Ser: 0.7 mg/dL (ref 0.57–1.00)
GFR calc Af Amer: 107 mL/min/{1.73_m2} (ref >59)
GFR calc non Af Amer: 93 mL/min/{1.73_m2} (ref >59)
Glucose: 73 mg/dL (ref 65–99)
Potassium: 4.4 mmol/L (ref 3.5–5.2)
Sodium: 142 mmol/L (ref 134–144)

## 2018-11-12 LAB — MAGNESIUM: Magnesium: 2.3 mg/dL (ref 1.6–2.3)

## 2018-11-12 NOTE — Patient Instructions (Addendum)
Medication Instructions:  Continue current medications  If you need a refill on your cardiac medications before your next appointment, please call your pharmacy.  Labwork: BMP and Magnesium  HERE IN OUR OFFICE AT [facility]  You will need to fast. DO NOT EAT OR DRINK PAST MIDNIGHT.     You will NOT need to fast   Take the provided lab slips with you to the lab for your blood draw.   When you have your labs (blood work) drawn today and your tests are completely normal, you will receive your results only by MyChart Message (if you have MyChart) -OR-  A paper copy in the mail.  If you have any lab test that is abnormal or we need to change your treatment, we will call you to review these results.  Testing/Procedures: None Ordered  Follow-Up: .  Your physician recommends that you schedule a follow-up appointment in: 2 Months with Dr Gwenlyn Found   At [facility], you and your health needs are our priority.  As part of our continuing mission to provide you with exceptional heart care, we have created designated Provider Care Teams.  These Care Teams include your primary Cardiologist (physician) and Advanced Practice Providers (APPs -  Physician Assistants and Nurse Practitioners) who all work together to provide you with the care you need, when you need it.  Thank you for choosing [facility] at [facility]!!

## 2018-11-23 ENCOUNTER — Other Ambulatory Visit: Payer: Self-pay | Admitting: Internal Medicine

## 2018-11-26 NOTE — Telephone Encounter (Signed)
Last filled: 10/30/2018 Last ov:06/13/2018

## 2018-11-27 NOTE — Telephone Encounter (Signed)
Refilling medication today  X 1  Arrange  A visit before next refill  ( virtual or  In person)  I am aware that cardiology team is  Following up  Heart issues . And hope she is doing well

## 2018-12-29 ENCOUNTER — Other Ambulatory Visit: Payer: Self-pay | Admitting: Internal Medicine

## 2018-12-31 NOTE — Telephone Encounter (Signed)
Last ov: 06/13/2018 Last filled :11/27/18

## 2019-01-02 NOTE — Telephone Encounter (Signed)
lvm for pt to call back to schedule a virtual visit for next week for med check

## 2019-01-02 NOTE — Telephone Encounter (Signed)
Please arrange virtual visit  Next week is ok  30 minutes   For med check   Hope she is doing well  after her heart attack.   I will send in refill  today

## 2019-01-04 NOTE — Progress Notes (Signed)
Virtual Visit via Video Note  I connected with@ on 01/07/19 at  8:45 AM EDT by a video enabled telemedicine application and verified that I am speaking with the correct person using two identifiers. Location patient: home Location provider:work  office Persons participating in the virtual visit: patient, provider  WIth national recommendations  regarding COVID 19 pandemic   video visit is advised over in office visit for this patient.  Patient aware  of the limitations of evaluation and management by telemedicine and  availability of in person appointments. and agreed to proceed.   HPI: Valerie Love presents for video visit   For Med evaluation   Had recent  Non stemi mi  And SVT  and  Hospitalized  Doing well now  Working 3 days per week now .     Anxiety mood:citalopram  Things seem stable and she is coping    and alprazolam since hops at night for sleep as mind races  But no panic attacks and functioning  Well   Neg td ocass etoh   The day of event used 2 puffs albuterol    Before walk and had  Left jaw ache and  Sob  With the walk    Back to gentle walking and ok   ROS: See pertinent positives and negatives per HPI. No current cp sob  syncope  Past Medical History:  Diagnosis Date  . Anxiety   . Arthritis    R knee, back, hands   . Cellulitis of knee 04/2016   RT KNEE  . Childhood asthma   . Complication of anesthesia    woke up slowly - 52yrs. ago  . Depression    related to husband illness and death  . History of jaundice    as a child    . Hx of varicella   . Hyperthyroidism    during pregnancy, treated & resolved post partum   . IBS (irritable bowel syndrome)   . LBBB (left bundle branch block)   . Scoliosis   . Scoliosis 07/2015  . Thyroid disease in pregnancy   . TOBACCO DEPENDENCE 07/06/2006   Qualifier: Diagnosis of  By: Samara Snide      Past Surgical History:  Procedure Laterality Date  . BREAST BIOPSY Left 80  . BREAST BIOPSY Right 1979   benign    . Cyst removed from ovary    . KNEE SURGERY     right duda   . LEFT HEART CATH AND CORONARY ANGIOGRAPHY N/A 10/31/2018   Procedure: LEFT HEART CATH AND CORONARY ANGIOGRAPHY;  Surgeon: Troy Sine, MD;  Location: [facility];  Service: Cardiovascular;  Laterality: N/A;  . TONSILLECTOMY AND ADENOIDECTOMY  1073  . TOTAL KNEE ARTHROPLASTY 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   Procedure: TOTAL KNEE ARTHROPLASTY;  Surgeon: Newt Minion, MD;  Location: [facility];  Service: Orthopedics;  Laterality: Right;  . TUBAL LIGATION    . WISDOM TOOTH EXTRACTION      Family History  Problem Relation Age of Onset  . Alcohol abuse Mother        died from stomach ulcers  . Stroke Father        died  from cva and mi  . Hypertension Father   . Hyperlipidemia Father   . Coronary artery disease Father        Age 53  . Colon polyps Sister   . Hepatitis C Brother   . Colon cancer Neg Hx     Social  History   Tobacco Use  . Smoking status: Former Smoker    Packs/day: 0.25    Years: 40.00    Pack years: 10.00    Types: Cigarettes    Quit date: 01/06/2016    Years since quitting: 3.0  . Smokeless tobacco: Never Used  . Tobacco comment: none in about 1 week  Substance Use Topics  . Alcohol use: Yes    Alcohol/week: 0.0 standard drinks    Comment: socially-wine- daily  . Drug use: No      Current Outpatient Medications:  .  ALPRAZolam (XANAX) 0.5 MG tablet, TAKE ONE TABLET BY MOUTH THREE TIMES A DAY AS NEEDED FOR ANXIETY ( MAX OF 3 TABLETS PER 24 HOURS), Disp: 30 tablet, Rfl: 0 .  Calcium Carb-Cholecalciferol (CALCIUM+D3) 600-800 MG-UNIT TABS, Take 1 tablet by mouth daily with breakfast., Disp: , Rfl:  .  citalopram (CELEXA) 20 MG tablet, Take 1 tablet (20 mg total) by mouth daily., Disp: 90 tablet, Rfl: 3 .  dicyclomine (BENTYL) 10 MG capsule, Take 1 capsule (10 mg total) by mouth every 6 (six) hours as needed (abdominal spasms)., Disp: 60 capsule, Rfl: 2 .  diphenhydrAMINE (BENADRYL) 25 MG tablet, Take 25  mg by mouth at bedtime as needed for allergies or sleep. , Disp: , Rfl:  .  metoprolol tartrate (LOPRESSOR) 25 MG tablet, Take 1 tablet (25 mg total) by mouth 2 (two) times daily., Disp: 60 tablet, Rfl: 2 .  metroNIDAZOLE (FLAGYL) 500 MG tablet, , Disp: , Rfl:  .  Multiple Vitamins-Minerals (ALIVE ONCE DAILY WOMENS 50+) TABS, Take 1 tablet by mouth daily., Disp: , Rfl:  .  naproxen sodium (ANAPROX) 220 MG tablet, Take 440 mg by mouth every morning. , Disp: , Rfl:  .  PROAIR HFA 108 (90 Base) MCG/ACT inhaler, INHALE TWO PUFFS BY MOUTH EVERY 6 HOURS AS NEEDED FOR WHEEZING OR SHORTNESS OF BREATH (Patient taking differently: Inhale 2 puffs into the lungs every 6 (six) hours as needed for wheezing or shortness of breath. ), Disp: 8.5 g, Rfl: 0 .  Probiotic Product (TRUBIOTICS) CAPS, Take 1 capsule by mouth daily., Disp: , Rfl:   EXAM: BP Readings from Last 3 Encounters:  11/12/18 130/72  11/01/18 130/81  06/13/18 120/78    VITALS per patient if applicable:  GENERAL: alert, oriented, appears well and in no acute distress HEENT: atraumatic, conjunttiva clear, no obvious abnormalities on inspection of external nose and ears NECK: normal movements of the head and neck LUNGS: on inspection no signs of respiratory distress, breathing rate appears normal, no obvious gross SOB, gasping or wheezing CV: no obvious cyanosis PSYCH/NEURO: pleasant and cooperative, no obvious depression or anxiety, speech and thought processing grossly intact Lab Results  Component Value Date   WBC 5.7 11/01/2018   HGB 12.6 11/01/2018   HCT 37.6 11/01/2018   PLT 244 11/01/2018   GLUCOSE 73 11/12/2018   CHOL 192 10/31/2018   TRIG 110 10/31/2018   HDL 63 10/31/2018   LDLCALC 107 (H) 10/31/2018   ALT 15 06/13/2018   AST 18 06/13/2018   NA 142 11/12/2018   K 4.4 11/12/2018   CL 103 11/12/2018   CREATININE 0.70 11/12/2018   BUN 18 11/12/2018   CO2 27 11/12/2018   TSH 2.647 10/31/2018   HGBA1C 5.6 06/13/2018     ASSESSMENT AND PLAN:  Discussed the following assessment and plan:    ICD-10-CM   1. Anxiety state  F41.1   2. Medication management  [phone]  3. Need for immunization against influenza  Z23 Flu Vaccine QUAD 36+ mos IM  4. Palpitations  R00.2   5. Sleep disturbance  G47.9    situational since hosp  strategies limit reg use alpraz    Doing well sleep disruption  Avoid reg night use of alprax but ok for now  And pharmacy to let us kow when needs  refills   Cont citalopram at this time  Luisa Dago is out of network now get copy of last pap mammo dexa etc  And we can reviwe at  nextx cpx and plan fu    Input dr berry about  Inhaler use  Risk benefit but she is doing well  Counseled.   Expectant management and discussion of plan and treatment with opportunity to ask questions and all were answered. The patient agreed with the plan and demonstrated an understanding of the instructions.   Advised to call back or seek an in-person evaluation if worsening  or having  further concerns .  Total visit > 50% spent counseling and coordinating care as indicated in above note and in instructions to patient .     Berniece Andreas, MD

## 2019-01-07 ENCOUNTER — Encounter: Payer: Self-pay | Admitting: Internal Medicine

## 2019-01-07 ENCOUNTER — Other Ambulatory Visit: Payer: Self-pay

## 2019-01-07 ENCOUNTER — Telehealth (INDEPENDENT_AMBULATORY_CARE_PROVIDER_SITE_OTHER): Payer: PRIVATE HEALTH INSURANCE | Admitting: Internal Medicine

## 2019-01-07 DIAGNOSIS — F411 Generalized anxiety disorder: Secondary | ICD-10-CM

## 2019-01-07 DIAGNOSIS — Z79899 Other long term (current) drug therapy: Secondary | ICD-10-CM

## 2019-01-07 DIAGNOSIS — R002 Palpitations: Secondary | ICD-10-CM

## 2019-01-07 DIAGNOSIS — Z23 Encounter for immunization: Secondary | ICD-10-CM | POA: Diagnosis not present

## 2019-01-07 DIAGNOSIS — G479 Sleep disorder, unspecified: Secondary | ICD-10-CM

## 2019-01-18 ENCOUNTER — Telehealth: Payer: Self-pay

## 2019-01-18 ENCOUNTER — Telehealth (INDEPENDENT_AMBULATORY_CARE_PROVIDER_SITE_OTHER): Payer: PRIVATE HEALTH INSURANCE | Admitting: Cardiovascular Disease

## 2019-01-18 VITALS — BP 123/71 | HR 70 | Ht 61.0 in | Wt 159.0 lb

## 2019-01-18 DIAGNOSIS — I519 Heart disease, unspecified: Secondary | ICD-10-CM

## 2019-01-18 DIAGNOSIS — I447 Left bundle-branch block, unspecified: Secondary | ICD-10-CM

## 2019-01-18 DIAGNOSIS — R9431 Abnormal electrocardiogram [ECG] [EKG]: Secondary | ICD-10-CM

## 2019-01-18 DIAGNOSIS — R002 Palpitations: Secondary | ICD-10-CM

## 2019-01-18 DIAGNOSIS — I214 Non-ST elevation (NSTEMI) myocardial infarction: Secondary | ICD-10-CM | POA: Diagnosis not present

## 2019-01-18 NOTE — Telephone Encounter (Signed)
Patient and/or DPR-approved person aware of 9/11 AVS instructions and verbalized understanding.  Letter including After Visit Summary and any other necessary documents to be mailed to the patient's address on file.   

## 2019-01-18 NOTE — Progress Notes (Signed)
Virtual Visit via Telephone Note   This visit type was conducted due to national recommendations for restrictions regarding the COVID-19 Pandemic (e.g. social distancing) in an effort to limit this patient's exposure and mitigate transmission in our community.  Due to her co-morbid illnesses, this patient is at least at moderate risk for complications without adequate follow up.  This format is felt to be most appropriate for this patient at this time.  The patient did not have access to video technology/had technical difficulties with video requiring transitioning to audio format only (telephone).  All issues noted in this document were discussed and addressed.  No physical exam could be performed with this format.  Please refer to the patient's chart for her  consent to telehealth for [facility].   Date:  01/18/2019   ID:  Valerie Love, DOB 1955-11-08, MRN [id number]  Patient Location: Home Provider Location: Home  PCP:  Madelin Headings, MD  Cardiologist:  Nanetta Batty, MD  Electrophysiologist:  None   Evaluation Performed:  Follow-Up Visit  Chief Complaint: Follow-up SVT  History of Present Illness:    Valerie Love is a 63 y.o. widowed Caucasian female mother of 3 children, grandmother of 3 grandchildren who I am seeing for post hospital follow-up.  She was hospitalized 10/30/2018 for 3 days for a non-STEMI, chest pain and what was presumably PSVT.  She does have left bundle branch block.  She apparently had a Myoview stress test performed 5/12 revealing EF of 48% with fixed septal defect consistent with bundle branch block artifact.  She does not smoke.  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  During her hospitalization her troponins rose to about 1000.  Based on this she underwent diagnostic coronary angiography 10/31/2018 which was entirely normal with normal coronary arteries.  EF was 40 to 45% with septal dyssynchrony.  She was seen by Dr. Elberta Fortis for EP  evaluation who placed her on a beta-blocker.  Since discharge she has been doing her normal work routine, walks and works in her garden without significant palpitations.  She says the beta-blocker has improved this.  If she has recurrent prolonged symptoms we talked about the possibility of loop recorder implantation.  She did have an event monitor which resulted 12/19/2018 that showed only occasional PVCs.   The patient does not have symptoms concerning for COVID-19 infection (fever, chills, cough, or new shortness of breath).    Past Medical History:  Diagnosis Date  . Anxiety   . Arthritis    R knee, back, hands   . Cellulitis of knee 04/2016   RT KNEE  . Childhood asthma   . Complication of anesthesia    woke up slowly - 48yrs. ago  . Depression    related to husband illness and death  . History of jaundice    as a child    . Hx of varicella   . Hyperthyroidism    during pregnancy, treated & resolved post partum   . IBS (irritable bowel syndrome)   . LBBB (left bundle branch block)   . Scoliosis   . Scoliosis 07/2015  . Thyroid disease in pregnancy   . TOBACCO DEPENDENCE 07/06/2006   Qualifier: Diagnosis of  By: Knox Royalty     Past Surgical History:  Procedure Laterality Date  . BREAST BIOPSY Left 80  . BREAST BIOPSY Right 1979   benign   . Cyst removed from ovary    . KNEE SURGERY  right duda   . LEFT HEART CATH AND CORONARY ANGIOGRAPHY N/A 10/31/2018   Procedure: LEFT HEART CATH AND CORONARY ANGIOGRAPHY;  Surgeon: Lennette BihariKelly, Thomas A, MD;  Location: [facility];  Service: Cardiovascular;  Laterality: N/A;  . TONSILLECTOMY AND ADENOIDECTOMY  1073  . TOTAL KNEE ARTHROPLASTY 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   Procedure: TOTAL KNEE ARTHROPLASTY;  Surgeon: Nadara MustardMarcus V Duda, MD;  Location: [facility];  Service: Orthopedics;  Laterality: Right;  . TUBAL LIGATION    . WISDOM TOOTH EXTRACTION       Current Meds  Medication Sig  . ALPRAZolam (XANAX) 0.5 MG tablet TAKE ONE TABLET BY MOUTH  THREE TIMES A DAY AS NEEDED FOR ANXIETY ( MAX OF 3 TABLETS PER 24 HOURS)  . Calcium Carb-Cholecalciferol (CALCIUM+D3) 600-800 MG-UNIT TABS Take 1 tablet by mouth daily with breakfast.  . citalopram (CELEXA) 20 MG tablet Take 1 tablet (20 mg total) by mouth daily.  Marland Kitchen. dicyclomine (BENTYL) 10 MG capsule Take 1 capsule (10 mg total) by mouth every 6 (six) hours as needed (abdominal spasms).  . diphenhydrAMINE (BENADRYL) 25 MG tablet Take 25 mg by mouth at bedtime as needed for allergies or sleep.   . metoprolol tartrate (LOPRESSOR) 25 MG tablet Take 1 tablet (25 mg total) by mouth 2 (two) times daily.  . metroNIDAZOLE (FLAGYL) 500 MG tablet   . Multiple Vitamins-Minerals (ALIVE ONCE DAILY WOMENS 50+) TABS Take 1 tablet by mouth daily.  . naproxen sodium (ANAPROX) 220 MG tablet Take 440 mg by mouth every morning.   Marland Kitchen. PROAIR HFA 108 (90 Base) MCG/ACT inhaler INHALE TWO PUFFS BY MOUTH EVERY 6 HOURS AS NEEDED FOR WHEEZING OR SHORTNESS OF BREATH (Patient taking differently: Inhale 2 puffs into the lungs every 6 (six) hours as needed for wheezing or shortness of breath. )  . Probiotic Product (TRUBIOTICS) CAPS Take 1 capsule by mouth daily.     Allergies:   Codeine and Lactose intolerance (gi)   Social History   Tobacco Use  . Smoking status: Former Smoker    Packs/day: 0.25    Years: 40.00    Pack years: 10.00    Types: Cigarettes    Quit date: 01/06/2016    Years since quitting: 3.0  . Smokeless tobacco: Never Used  . Tobacco comment: none in about 1 week  Substance Use Topics  . Alcohol use: Yes    Alcohol/week: 0.0 standard drinks    Comment: socially-wine- daily  . Drug use: No     Family Hx: The patient's family history includes Alcohol abuse in her mother; Colon polyps in her sister; Coronary artery disease in her father; Hepatitis C in her brother; Hyperlipidemia in her father; Hypertension in her father; Stroke in her father. There is no history of Colon cancer.  ROS:   Please see  the history of present illness.     All other systems reviewed and are negative.   Prior CV studies:   The following studies were reviewed today:  Event monitor resulting 12/19/2018  Labs/Other Tests and Data Reviewed:    EKG:  No ECG reviewed.  Recent Labs: 06/13/2018: ALT 15 10/31/2018: TSH 2.647 11/01/2018: Hemoglobin 12.6; Platelets 244 11/12/2018: BUN 18; Creatinine, Ser 0.70; Magnesium 2.3; Potassium 4.4; Sodium 142   Recent Lipid Panel Lab Results  Component Value Date/Time   CHOL 192 10/31/2018 02:51 AM   TRIG 110 10/31/2018 02:51 AM   HDL 63 10/31/2018 02:51 AM   CHOLHDL 3.0 10/31/2018 02:51 AM   LDLCALC 107 (H)  10/31/2018 02:51 AM    Wt Readings from Last 3 Encounters:  01/18/19 159 lb (72.1 kg)  11/12/18 159 lb (72.1 kg)  11/01/18 158 lb 11.2 oz (72 kg)     Objective:    Vital Signs:  BP 123/71   Pulse 70   Ht 5\' 1"  (1.549 m)   Wt 159 lb (72.1 kg)   BMI 30.04 kg/m    VITAL SIGNS:  reviewed a complete physical exam was not performed today since this was a virtual telemedicine phone visit  ASSESSMENT & PLAN:    1. Left bundle branch block- chronic 2. 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  This is an old finding since she had an EF of 48% by gated SPECT May 2012. 3. PSVT- patient had 4 hours of tachypalpitations which led to her hospitalization on 6/23.  A subsequent event monitor for 30 days resulting on 12/19/2018 only showed occasional PVCs.  She is on a beta-blocker which she thinks is helpful. 4. Non-STEMI- recent hospitalization with chest pain and positive troponins up to 1000 with cardiac catheterization revealing normal coronary arteries.  I suspect this is related to demand ischemia.  COVID-19 Education: The signs and symptoms of COVID-19 were discussed with the patient and how to seek care for testing (follow up with PCP or arrange E-visit).  The importance of social distancing was  discussed today.  Time:   Today, I have spent 9 minutes with the patient with telehealth technology discussing the above problems.     Medication Adjustments/Labs and Tests Ordered: Current medicines are reviewed at length with the patient today.  Concerns regarding medicines are outlined above.   Tests Ordered: No orders of the defined types were placed in this encounter.   Medication Changes: No orders of the defined types were placed in this encounter.   Follow Up:  In Person in 6 month(s)  Signed, Quay Burow, MD  01/18/2019 9:36 AM    [place]

## 2019-01-18 NOTE — Patient Instructions (Signed)
Medication Instructions:  Your physician recommends that you continue on your current medications as directed. Please refer to the Current Medication list given to you today.  If you need a refill on your cardiac medications before your next appointment, please call your pharmacy.   Lab work: none If you have labs (blood work) drawn today and your tests are completely normal, you will receive your results only by: Marland Kitchen MyChart Message (if you have MyChart) OR . A paper copy in the mail If you have any lab test that is abnormal or we need to change your treatment, we will call you to review the results.  Testing/Procedures: Your physician has requested that you have an echocardiogram. Echocardiography is a painless test that uses sound waves to create images of your heart. It provides your doctor with information about the size and shape of your heart and how well your heart's chambers and valves are working. This procedure takes approximately one hour. There are no restrictions for this procedure. LOCATION: [facility]: [place], [place] [address] DUE IN 6 MONTHS. YOU WILL BE CONTACTED BY A SCHEDULER TO SET UP THIS APPOINTMENT.  Follow-Up: At [facility], you and your health needs are our priority.  As part of our continuing mission to provide you with exceptional heart care, we have created designated Provider Care Teams.  These Care Teams include your primary Cardiologist (physician) and Advanced Practice Providers (APPs -  Physician Assistants and Nurse Practitioners) who all work together to provide you with the care you need, when you need it. You will need a follow up appointment in 6 months with Dr. Quay Burow.  Please call our office 2 months in advance to schedule this/each appointment.  PLEASE HAVE YOUR ECHOCARDIOGRAM COMPLETE BEFORE THIS APPOINTMENT.

## 2019-01-22 ENCOUNTER — Other Ambulatory Visit: Payer: Self-pay | Admitting: Cardiology

## 2019-02-24 ENCOUNTER — Other Ambulatory Visit: Payer: Self-pay | Admitting: Internal Medicine

## 2019-02-25 NOTE — Telephone Encounter (Signed)
Sent in electronically .  

## 2019-02-25 NOTE — Telephone Encounter (Signed)
Last ov:01/07/2019 Last filled :01/02/2019

## 2019-04-14 ENCOUNTER — Other Ambulatory Visit: Payer: Self-pay | Admitting: Internal Medicine

## 2019-04-15 NOTE — Telephone Encounter (Signed)
Last ov:01/07/2019 Last filled:02/25/2019

## 2019-04-15 NOTE — Telephone Encounter (Signed)
Sent in electronically . Have her get on schedule  For cpx  In  February 2021

## 2019-04-15 NOTE — Telephone Encounter (Signed)
Pt has been set up

## 2019-06-07 ENCOUNTER — Other Ambulatory Visit: Payer: Self-pay

## 2019-06-10 ENCOUNTER — Encounter: Payer: Self-pay | Admitting: Internal Medicine

## 2019-06-10 ENCOUNTER — Ambulatory Visit (INDEPENDENT_AMBULATORY_CARE_PROVIDER_SITE_OTHER): Payer: 59 | Admitting: Internal Medicine

## 2019-06-10 VITALS — BP 118/72 | HR 67 | Temp 98.2°F | Ht 61.0 in | Wt 159.6 lb

## 2019-06-10 DIAGNOSIS — F411 Generalized anxiety disorder: Secondary | ICD-10-CM | POA: Diagnosis not present

## 2019-06-10 DIAGNOSIS — Z0000 Encounter for general adult medical examination without abnormal findings: Secondary | ICD-10-CM

## 2019-06-10 DIAGNOSIS — N898 Other specified noninflammatory disorders of vagina: Secondary | ICD-10-CM | POA: Diagnosis not present

## 2019-06-10 DIAGNOSIS — R002 Palpitations: Secondary | ICD-10-CM

## 2019-06-10 DIAGNOSIS — Z79899 Other long term (current) drug therapy: Secondary | ICD-10-CM | POA: Diagnosis not present

## 2019-06-10 LAB — BASIC METABOLIC PANEL
BUN: 18 mg/dL (ref 6–23)
CO2: 29 mEq/L (ref 19–32)
Calcium: 9.7 mg/dL (ref 8.4–10.5)
Chloride: 103 mEq/L (ref 96–112)
Creatinine, Ser: 0.78 mg/dL (ref 0.40–1.20)
GFR: 74.39 mL/min (ref >60.00)
Glucose, Bld: 82 mg/dL (ref 70–99)
Potassium: 4.4 mEq/L (ref 3.5–5.1)
Sodium: 140 mEq/L (ref 135–145)

## 2019-06-10 LAB — CBC WITH DIFFERENTIAL/PLATELET
Basophils Absolute: 0 10*3/uL (ref 0.0–0.1)
Basophils Relative: 0.4 % (ref 0.0–3.0)
Eosinophils Absolute: 0.1 10*3/uL (ref 0.0–0.7)
Eosinophils Relative: 2 % (ref 0.0–5.0)
HCT: 43.2 % (ref 36.0–46.0)
Hemoglobin: 14.4 g/dL (ref 12.0–15.0)
Lymphocytes Relative: 38.1 % (ref 12.0–46.0)
Lymphs Abs: 2.7 10*3/uL (ref 0.7–4.0)
MCHC: 33.4 g/dL (ref 30.0–36.0)
MCV: 90.8 fl (ref 78.0–100.0)
Monocytes Absolute: 0.5 10*3/uL (ref 0.1–1.0)
Monocytes Relative: 7.6 % (ref 3.0–12.0)
Neutro Abs: 3.7 10*3/uL (ref 1.4–7.7)
Neutrophils Relative %: 51.9 % (ref 43.0–77.0)
Platelets: 294 10*3/uL (ref 150.0–400.0)
RBC: 4.76 Mil/uL (ref 3.87–5.11)
RDW: 13.1 % (ref 11.5–15.5)
WBC: 7.2 10*3/uL (ref 4.0–10.5)

## 2019-06-10 LAB — HEPATIC FUNCTION PANEL
ALT: 17 U/L (ref 0–35)
AST: 20 U/L (ref 0–37)
Albumin: 4.4 g/dL (ref 3.5–5.2)
Alkaline Phosphatase: 68 U/L (ref 39–117)
Bilirubin, Direct: 0.1 mg/dL (ref 0.0–0.3)
Total Bilirubin: 0.3 mg/dL (ref 0.2–1.2)
Total Protein: 6.7 g/dL (ref 6.0–8.3)

## 2019-06-10 LAB — LIPID PANEL
Cholesterol: 230 mg/dL — ABNORMAL HIGH (ref 0–200)
HDL: 68.1 mg/dL (ref >39.00)
LDL Cholesterol: 127 mg/dL — ABNORMAL HIGH (ref 0–99)
NonHDL: 161.88
Total CHOL/HDL Ratio: 3
Triglycerides: 175 mg/dL — ABNORMAL HIGH (ref 0.0–149.0)
VLDL: 35 mg/dL (ref 0.0–40.0)

## 2019-06-10 LAB — TSH: TSH: 1.92 u[IU]/mL (ref 0.35–4.50)

## 2019-06-10 LAB — POCT URINALYSIS DIPSTICK
Bilirubin, UA: NEGATIVE
Blood, UA: NEGATIVE
Glucose, UA: NEGATIVE
Ketones, UA: NEGATIVE
Leukocytes, UA: NEGATIVE
Nitrite, UA: NEGATIVE
Protein, UA: POSITIVE — AB
Spec Grav, UA: >=1.03 — AB (ref 1.010–1.025)
Urobilinogen, UA: 0.2 E.U./dL
pH, UA: 5 (ref 5.0–8.0)

## 2019-06-10 LAB — HEMOGLOBIN A1C: Hgb A1c MFr Bld: 5.6 % (ref 4.6–6.5)

## 2019-06-10 MED ORDER — METRONIDAZOLE 0.75 % VA GEL: 1.0000 | Freq: Two times a day (BID) | VAGINAL | 0 refills | Status: DC

## 2019-06-10 NOTE — Progress Notes (Signed)
This visit occurred during the SARS-CoV-2 public health emergency.  Safety protocols were in place, including screening questions prior to the visit, additional usage of staff PPE, and extensive cleaning of exam room while observing appropriate contact time as indicated for disinfecting solutions.    Chief Complaint  Patient presents with  . Annual Exam    Pt has no other concerns     HPI: Patient  Valerie Love  64 y.o. comes in today for Preventive Health Care visit      Anxiety meds  staying on med  Same dose since covid and her  c events   Doing ok  Takes alprazolamr  Once a day at h most not all day but  In evening if needed to stop mind from racing   Has vag sx that are the same as  BV rx with oral metronidazole by gyn    Oral surgery recent t and had  amoxiclliin.  But doesn't seem like the yeast  Some burning  No abd pain   No current cp sob  But  Some fearul with self exercise since had sx while walking alone in the summer   And  hesitant to go to gym for other safety reasons     Health Maintenance  Topic Date Due  . MAMMOGRAM  12/28/2019  . PAP SMEAR-Modifier  12/27/2020  . COLONOSCOPY  01/05/2025  . TETANUS/TDAP  02/22/2029  . INFLUENZA VACCINE  Completed  . Hepatitis C Screening  Completed  . HIV Screening  Completed   Health Maintenance Review LIFESTYLE:  Exercise:  Gone down   After knee surgery  No gym.  Tobacco/ETS: no Alcohol:  Wine 3-4 per week  Sugar beverages: Sleep:love my sleep   9- 7  Drug use: no HH of 4  no Work: 20 - 25 hours per week    ROS:  GEN/ HEENT: No fever, significant weight changes sweats headaches vision problems hearing changes, CV/ PULM; No chest pain shortness of breath cough, syncope,edema  change in exercise tolerance. GI /GU:  No adominal pain, vomiting, change in bowel habits. No blood in the stool.   SKIN/HEME: ,no acute skin rashes suspicious lesions or bleeding. No lymphadenopathy, nodules, masses.  NEURO/ PSYCH:  No  neurologic signs such as weakness numbness. IMM/ Allergy: No unusual infections.  Allergy .   REST of 12 system review negative except as per HPI   Past Medical History:  Diagnosis Date  . Anxiety   . Arthritis    R knee, back, hands   . Cellulitis of knee 04/2016   RT KNEE  . Cellulitis of right knee 04/18/2016  . Childhood asthma   . Complication of anesthesia    woke up slowly - 67yrs. ago  . Depression    related to husband illness and death  . History of jaundice    as a child    . Hx of varicella   . Hyperthyroidism    during pregnancy, treated & resolved post partum   . IBS (irritable bowel syndrome)   . LBBB (left bundle branch block)   . Scoliosis   . Scoliosis 07/2015  . Thyroid disease in pregnancy   . TOBACCO DEPENDENCE 07/06/2006   Qualifier: Diagnosis of  By: Knox Royalty    . Troponin level elevated     Past Surgical History:  Procedure Laterality Date  . BREAST BIOPSY Left 80  . BREAST BIOPSY Right 1979   benign   . Cyst removed from  ovary    . KNEE SURGERY     right duda   . LEFT HEART CATH AND CORONARY ANGIOGRAPHY N/A 10/31/2018   Procedure: LEFT HEART CATH AND CORONARY ANGIOGRAPHY;  Surgeon: Troy Sine, MD;  Location: [facility];  Service: Cardiovascular;  Laterality: N/A;  . TONSILLECTOMY AND ADENOIDECTOMY  1073  . TOTAL KNEE ARTHROPLASTY 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   Procedure: TOTAL KNEE ARTHROPLASTY;  Surgeon: Newt Minion, MD;  Location: [facility];  Service: Orthopedics;  Laterality: Right;  . TUBAL LIGATION    . WISDOM TOOTH EXTRACTION      Family History  Problem Relation Age of Onset  . Alcohol abuse Mother        died from stomach ulcers  . Stroke Father        died  from cva and mi  . Hypertension Father   . Hyperlipidemia Father   . Coronary artery disease Father        Age 34  . Colon polyps Sister   . Hepatitis C Brother   . Colon cancer Neg Hx       Outpatient Medications Prior to Visit  Medication Sig Dispense Refill  .  ALPRAZolam (XANAX) 0.5 MG tablet TAKE ONE TABLET BY MOUTH THREE TIMES DAILY AS NEEDED FOR ANXIETY. MAX 3 TABLET PER 24 HOURS 30 tablet 0  . Calcium Carb-Cholecalciferol (CALCIUM+D3) 600-800 MG-UNIT TABS Take 1 tablet by mouth daily with breakfast.    . citalopram (CELEXA) 20 MG tablet Take 1 tablet (20 mg total) by mouth daily. 90 tablet 3  . dicyclomine (BENTYL) 10 MG capsule Take 1 capsule (10 mg total) by mouth every 6 (six) hours as needed (abdominal spasms). 60 capsule 2  . diphenhydrAMINE (BENADRYL) 25 MG tablet Take 25 mg by mouth at bedtime as needed for allergies or sleep.     . metoprolol tartrate (LOPRESSOR) 25 MG tablet TAKE ONE TABLET BY MOUTH TWICE A DAY 180 tablet 3  . Multiple Vitamins-Minerals (ALIVE ONCE DAILY WOMENS 50+) TABS Take 1 tablet by mouth daily.    . naproxen sodium (ANAPROX) 220 MG tablet Take 440 mg by mouth every morning.     Marland Kitchen PROAIR HFA 108 (90 Base) MCG/ACT inhaler INHALE TWO PUFFS BY MOUTH EVERY 6 HOURS AS NEEDED FOR WHEEZING OR SHORTNESS OF BREATH (Patient taking differently: Inhale 2 puffs into the lungs every 6 (six) hours as needed for wheezing or shortness of breath. ) 8.5 g 0  . Probiotic Product (TRUBIOTICS) CAPS Take 1 capsule by mouth daily.    . metroNIDAZOLE (FLAGYL) 500 MG tablet      No facility-administered medications prior to visit.     EXAM:  BP 118/72 (BP Location: Right Arm, Patient Position: Sitting, Cuff Size: Normal)   Pulse 67   Temp 98.2 F (36.8 C) (Temporal)   Ht 5\' 1"  (1.549 m)   Wt 159 lb 9.6 oz (72.4 kg)   SpO2 96%   BMI 30.16 kg/m   Body mass index is 30.16 kg/m. Wt Readings from Last 3 Encounters:  06/10/19 159 lb 9.6 oz (72.4 kg)  01/18/19 159 lb (72.1 kg)  11/12/18 159 lb (72.1 kg)    Physical Exam: Vital signs reviewed [id number] is a well-developed well-nourished alert cooperative    who appearsr stated age in no acute distress.  HEENT: normocephalic atraumatic , Eyes: PERRL EOM's full, conjunctiva clear, .,  Ears: no deformity EAC's clear TMs with normal landmarks. Mouth: op masked NECK: supple without masses, thyromegaly  or bruits. CHEST/PULM:  Clear to auscultation and percussion breath sounds equal no wheeze , rales or rhonchi. No chest wall deformities or tenderness. Breast: normal by inspection . No dimpling, discharge, masses, tenderness or discharge . CV: PMI is nondisplaced, S1 S2 no gallops, murmurs, rubs. Peripheral pulses are presenty.No JVD .  ABDOMEN: Bowel sounds normal nontender  No guard or rebound, no hepato splenomegal no CVA tenderness.   Extremtities:  No clubbing cyanosis or edema, no acute joint swelling or redness no focal atrophy NEURO:  Oriented x3, cranial nerves 3-12 appear to be intact, no obvious focal weakness,gait within normal limits no abnormal reflexes or asymmetrical SKIN: No acute rashes normal turgor, color, no bruising or petechiae. PSYCH: Oriented, good eye contact, no obvious depression anxiety, cognition and judgment appear normal. LN: no cervical axillary inguinal adenopathy  Lab Results  Component Value Date   WBC 7.2 06/10/2019   HGB 14.4 06/10/2019   HCT 43.2 06/10/2019   PLT 294.0 06/10/2019   GLUCOSE 82 06/10/2019   CHOL 230 (H) 06/10/2019   TRIG 175.0 (H) 06/10/2019   HDL 68.10 06/10/2019   LDLCALC 127 (H) 06/10/2019   ALT 17 06/10/2019   AST 20 06/10/2019   NA 140 06/10/2019   K 4.4 06/10/2019   CL 103 06/10/2019   CREATININE 0.78 06/10/2019   BUN 18 06/10/2019   CO2 29 06/10/2019   TSH 1.92 06/10/2019   HGBA1C 5.6 06/10/2019    BP Readings from Last 3 Encounters:  06/10/19 118/72  01/18/19 123/71  11/12/18 130/72    Not fasting today ate around 1130 today   Lab done NF for convenience of not having to come back  At this time   ASSESSMENT AND PLAN:  Discussed the following assessment and plan:    ICD-10-CM   1. Visit for preventive health examination  Z00.00 Basic metabolic panel    CBC with Differential/Platelet     Hemoglobin A1c    Hepatic function panel    Lipid panel    TSH  2. Medication management  Z79.899 Basic metabolic panel    CBC with Differential/Platelet    Hemoglobin A1c    Hepatic function panel    Lipid panel    TSH   cont meds caution with benzos   3. Palpitations  R00.2 Basic metabolic panel    CBC with Differential/Platelet    Hemoglobin A1c    Hepatic function panel    Lipid panel    TSH  4. Vaginal discharge  N89.8 POCT urinalysis dipstick    Basic metabolic panel    CBC with Differential/Platelet    Hemoglobin A1c    Hepatic function panel    Lipid panel    TSH   pat says seems like BV  ok to rx topically empirically today  5. Anxiety state  F41.1 Basic metabolic panel    CBC with Differential/Platelet    Hemoglobin A1c    Hepatic function panel    Lipid panel    TSH  we can do next pap 2023 instead of 22  Or  If sx etc  Even though had non obstructive coronary on cath  Would opine to Cards about advisability of adding a statin med .   Plan yearly cpx and labs  And  Update in about 6 mos inregard to the anxiety meds  Visit depending on how doing . Can disc with cards team about  Ramping up physical activity   . Patient Care Team: Madelin Headings,  MD as PCP - General (Internal Medicine) Runell Gess, MD as PCP - Cardiology (Cardiology) Mitchel Honour, DO as Attending Physician (Obstetrics and Gynecology) Patient Instructions  Ask cardiology advice regarding .  Exercise ramp up that may help you.   Try vaginal metronidazole for BV   Once or twice a day .   Mammogram every 1-2 years can make your own appt  For  Screening   [facility]   Last pap  8 2019 if ok then every 3-5 years  .      Health Maintenance, Female Adopting a healthy lifestyle and getting preventive care are important in promoting health and wellness. Ask your health care provider about:  The right schedule for you to have regular tests and exams.  Things you can  do on your own to prevent diseases and keep yourself healthy. What should I know about diet, weight, and exercise? Eat a healthy diet   Eat a diet that includes plenty of vegetables, fruits, low-fat dairy products, and lean protein.  Do not eat a lot of foods that are high in solid fats, added sugars, or sodium. Maintain a healthy weight Body mass index (BMI) is used to identify weight problems. It estimates body fat based on height and weight. Your health care provider can help determine your BMI and help you achieve or maintain a healthy weight. Get regular exercise Get regular exercise. This is one of the most important things you can do for your health. Most adults should:  Exercise for at least 150 minutes each week. The exercise should increase your heart rate and make you sweat (moderate-intensity exercise).  Do strengthening exercises at least twice a week. This is in addition to the moderate-intensity exercise.  Spend less time sitting. Even light physical activity can be beneficial. Watch cholesterol and blood lipids Have your blood tested for lipids and cholesterol at 63 years of age, then have this test every 5 years. Have your cholesterol levels checked more often if:  Your lipid or cholesterol levels are high.  You are older than 64 years of age.  You are at high risk for heart disease. What should I know about cancer screening? Depending on your health history and family history, you may need to have cancer screening at various ages. This may include screening for:  Breast cancer.  Cervical cancer.  Colorectal cancer.  Skin cancer.  Lung cancer. What should I know about heart disease, diabetes, and high blood pressure? Blood pressure and heart disease  High blood pressure causes heart disease and increases the risk of stroke. This is more likely to develop in people who have high blood pressure readings, are of African descent, or are overweight.  Have your  blood pressure checked: ? Every 3-5 years if you are 110-18 years of age. ? Every year if you are 51 years old or older. Diabetes Have regular diabetes screenings. This checks your fasting blood sugar level. Have the screening done:  Once every three years after age 6 if you are at a normal weight and have a low risk for diabetes.  More often and at a younger age if you are overweight or have a high risk for diabetes. What should I know about preventing infection? Hepatitis B If you have a higher risk for hepatitis B, you should be screened for this virus. Talk with your health care provider to find out if you are at risk for hepatitis B infection. Hepatitis C Testing  is recommended for:  Everyone born from [birth year] through 1965.  Anyone with known risk factors for hepatitis C. Sexually transmitted infections (STIs)  Get screened for STIs, including gonorrhea and chlamydia, if: ? You are sexually active and are younger than 64 years of age. ? You are older than 64 years of age and your health care provider tells you that you are at risk for this type of infection. ? Your sexual activity has changed since you were last screened, and you are at increased risk for chlamydia or gonorrhea. Ask your health care provider if you are at risk.  Ask your health care provider about whether you are at high risk for HIV. Your health care provider may recommend a prescription medicine to help prevent HIV infection. If you choose to take medicine to prevent HIV, you should first get tested for HIV. You should then be tested every 3 months for as long as you are taking the medicine. Pregnancy  If you are about to stop having your period (premenopausal) and you may become pregnant, seek counseling before you get pregnant.  Take 400 to 800 micrograms (mcg) of folic acid every day if you become pregnant.  Ask for birth control (contraception) if you want to prevent pregnancy. Osteoporosis and  menopause Osteoporosis is a disease in which the bones lose minerals and strength with aging. This can result in bone fractures. If you are 29 years old or older, or if you are at risk for osteoporosis and fractures, ask your health care provider if you should:  Be screened for bone loss.  Take a calcium or vitamin D supplement to lower your risk of fractures.  Be given hormone replacement therapy (HRT) to treat symptoms of menopause. Follow these instructions at home: Lifestyle  Do not use any products that contain nicotine or tobacco, such as cigarettes, e-cigarettes, and chewing tobacco. If you need help quitting, ask your health care provider.  Do not use street drugs.  Do not share needles.  Ask your health care provider for help if you need support or information about quitting drugs. Alcohol use  Do not drink alcohol if: ? Your health care provider tells you not to drink. ? You are pregnant, may be pregnant, or are planning to become pregnant.  If you drink alcohol: ? Limit how much you use to 0-1 drink a day. ? Limit intake if you are breastfeeding.  Be aware of how much alcohol is in your drink. In the [place] one drink equals one 12 oz bottle of beer (355 mL), one 5 oz glass of wine (148 mL), or one 1 oz glass of hard liquor (44 mL). General instructions  Schedule regular health, dental, and eye exams.  Stay current with your vaccines.  Tell your health care provider if: ? You often feel depressed. ? You have ever been abused or do not feel safe at home. Summary  Adopting a healthy lifestyle and getting preventive care are important in promoting health and wellness.  Follow your health care provider's instructions about healthy diet, exercising, and getting tested or screened for diseases.  Follow your health care provider's instructions on monitoring your cholesterol and blood pressure. This information is not intended to replace advice given to you by your  health care provider. Make sure you discuss any questions you have with your health care provider. Document Revised: 04/18/2018 Document Reviewed: 04/18/2018 Elsevier Patient Education  2020 [place] Shealynn Saulnier M.D.

## 2019-06-10 NOTE — Patient Instructions (Addendum)
Ask cardiology advice regarding .  Exercise ramp up that may help you.   Try vaginal metronidazole for BV   Once or twice a day .   Mammogram every 1-2 years can make your own appt  For  Screening   [facility].   Last pap  8 2019 if ok then every 3-5 years  .      Health Maintenance, Female Adopting a healthy lifestyle and getting preventive care are important in promoting health and wellness. Ask your health care provider about:  The right schedule for you to have regular tests and exams.  Things you can do on your own to prevent diseases and keep yourself healthy. What should I know about diet, weight, and exercise? Eat a healthy diet   Eat a diet that includes plenty of vegetables, fruits, low-fat dairy products, and lean protein.  Do not eat a lot of foods that are high in solid fats, added sugars, or sodium. Maintain a healthy weight Body mass index (BMI) is used to identify weight problems. It estimates body fat based on height and weight. Your health care provider can help determine your BMI and help you achieve or maintain a healthy weight. Get regular exercise Get regular exercise. This is one of the most important things you can do for your health. Most adults should:  Exercise for at least 150 minutes each week. The exercise should increase your heart rate and make you sweat (moderate-intensity exercise).  Do strengthening exercises at least twice a week. This is in addition to the moderate-intensity exercise.  Spend less time sitting. Even light physical activity can be beneficial. Watch cholesterol and blood lipids Have your blood tested for lipids and cholesterol at 64 years of age, then have this test every 5 years. Have your cholesterol levels checked more often if:  Your lipid or cholesterol levels are high.  You are older than 64 years of age.  You are at high risk for heart disease. What should I know about cancer  screening? Depending on your health history and family history, you may need to have cancer screening at various ages. This may include screening for:  Breast cancer.  Cervical cancer.  Colorectal cancer.  Skin cancer.  Lung cancer. What should I know about heart disease, diabetes, and high blood pressure? Blood pressure and heart disease  High blood pressure causes heart disease and increases the risk of stroke. This is more likely to develop in people who have high blood pressure readings, are of African descent, or are overweight.  Have your blood pressure checked: ? Every 3-5 years if you are 56-71 years of age. ? Every year if you are 48 years old or older. Diabetes Have regular diabetes screenings. This checks your fasting blood sugar level. Have the screening done:  Once every three years after age 64 if you are at a normal weight and have a low risk for diabetes.  More often and at a younger age if you are overweight or have a high risk for diabetes. What should I know about preventing infection? Hepatitis B If you have a higher risk for hepatitis B, you should be screened for this virus. Talk with your health care provider to find out if you are at risk for hepatitis B infection. Hepatitis C Testing is recommended for:  Everyone born from [birth year] through 1965.  Anyone with known risk factors for hepatitis C. Sexually transmitted infections (STIs)  Get screened for STIs, including  gonorrhea and chlamydia, if: ? You are sexually active and are younger than 64 years of age. ? You are older than 64 years of age and your health care provider tells you that you are at risk for this type of infection. ? Your sexual activity has changed since you were last screened, and you are at increased risk for chlamydia or gonorrhea. Ask your health care provider if you are at risk.  Ask your health care provider about whether you are at high risk for HIV. Your health care provider may  recommend a prescription medicine to help prevent HIV infection. If you choose to take medicine to prevent HIV, you should first get tested for HIV. You should then be tested every 3 months for as long as you are taking the medicine. Pregnancy  If you are about to stop having your period (premenopausal) and you may become pregnant, seek counseling before you get pregnant.  Take 400 to 800 micrograms (mcg) of folic acid every day if you become pregnant.  Ask for birth control (contraception) if you want to prevent pregnancy. Osteoporosis and menopause Osteoporosis is a disease in which the bones lose minerals and strength with aging. This can result in bone fractures. If you are 71 years old or older, or if you are at risk for osteoporosis and fractures, ask your health care provider if you should:  Be screened for bone loss.  Take a calcium or vitamin D supplement to lower your risk of fractures.  Be given hormone replacement therapy (HRT) to treat symptoms of menopause. Follow these instructions at home: Lifestyle  Do not use any products that contain nicotine or tobacco, such as cigarettes, e-cigarettes, and chewing tobacco. If you need help quitting, ask your health care provider.  Do not use street drugs.  Do not share needles.  Ask your health care provider for help if you need support or information about quitting drugs. Alcohol use  Do not drink alcohol if: ? Your health care provider tells you not to drink. ? You are pregnant, may be pregnant, or are planning to become pregnant.  If you drink alcohol: ? Limit how much you use to 0-1 drink a day. ? Limit intake if you are breastfeeding.  Be aware of how much alcohol is in your drink. In the [place] one drink equals one 12 oz bottle of beer (355 mL), one 5 oz glass of wine (148 mL), or one 1 oz glass of hard liquor (44 mL). General instructions  Schedule regular health, dental, and eye exams.  Stay current with your  vaccines.  Tell your health care provider if: ? You often feel depressed. ? You have ever been abused or do not feel safe at home. Summary  Adopting a healthy lifestyle and getting preventive care are important in promoting health and wellness.  Follow your health care provider's instructions about healthy diet, exercising, and getting tested or screened for diseases.  Follow your health care provider's instructions on monitoring your cholesterol and blood pressure. This information is not intended to replace advice given to you by your health care provider. Make sure you discuss any questions you have with your health care provider. Document Revised: 04/18/2018 Document Reviewed: 04/18/2018 Elsevier Patient Education  2020 [employer].

## 2019-06-13 ENCOUNTER — Telehealth: Payer: Self-pay | Admitting: Internal Medicine

## 2019-06-13 ENCOUNTER — Telehealth: Payer: Self-pay | Admitting: Cardiovascular Disease

## 2019-06-13 DIAGNOSIS — E7800 Pure hypercholesterolemia, unspecified: Secondary | ICD-10-CM

## 2019-06-13 MED ORDER — ROSUVASTATIN CALCIUM 5 MG PO TABS: 5.0000 mg | ORAL_TABLET | Freq: Every day | ORAL | 3 refills | Status: DC

## 2019-06-13 NOTE — Telephone Encounter (Signed)
Spoke with patient and had let her know that Dr.Panosh has not reviewed these yet and that I will cal her as soon as Dr.Panosh reviews them

## 2019-06-13 NOTE — Telephone Encounter (Signed)
Spoke with pt, aware of dr berry's recommendations. New script sent to the pharmacy and Lab orders mailed to the pt   

## 2019-06-13 NOTE — Telephone Encounter (Signed)
Patient states she is returning a call in regards to lab results. Please call.

## 2019-06-13 NOTE — Progress Notes (Signed)
Results normal except  the cholesterol triglycerides up some  some from nonfasting but still may be higher than advised by cardiology  Sending results to  cardiology team . Intensify lifestyle interventions.  And if indicated consider statin medication as per cardiology advice

## 2019-06-13 NOTE — Telephone Encounter (Signed)
-----   Message from Runell Gess, MD sent at 06/13/2019  1:00 PM EST ----- Start Crestor 5 mg/d. Re check FLP 2-3 months

## 2019-06-13 NOTE — Telephone Encounter (Signed)
Pt is calling in stating that she would like to have her lab results from 06/10/2019 she is unable to get on to her myChart and has called the help desk and is very upset about it.

## 2019-06-17 ENCOUNTER — Other Ambulatory Visit: Payer: Self-pay | Admitting: Internal Medicine

## 2019-06-17 DIAGNOSIS — Z1231 Encounter for screening mammogram for malignant neoplasm of breast: Secondary | ICD-10-CM

## 2019-06-19 ENCOUNTER — Telehealth: Payer: Self-pay | Admitting: Cardiovascular Disease

## 2019-06-19 NOTE — Telephone Encounter (Signed)
This encounter was created in error - please disregard.

## 2019-06-19 NOTE — Addendum Note (Signed)
Addended by: Harlow Asa on: 06/19/2019 05:04 PM   Modules accepted: Level of Service, SmartSet

## 2019-06-19 NOTE — Telephone Encounter (Signed)
[id number] 

## 2019-06-19 NOTE — Telephone Encounter (Signed)
Spoke with patient. Per Debra's note patient is to have repeat lipid/liver in 2-3 months. Patient received her lab slips in the mail today. Patient verified where to come for lab draw. Patient reports she will come to the office in 3 months (May) for the lab work to be done. Patient informed future lab work would depend on the results of the lab work she has done in May. Patient verbalized understanding.

## 2019-06-19 NOTE — Telephone Encounter (Signed)
Patient calling with questions on how often she is to go and get blood drawn. She also would like to know where she goes.

## 2019-06-20 ENCOUNTER — Other Ambulatory Visit: Payer: Self-pay | Admitting: Internal Medicine

## 2019-07-01 ENCOUNTER — Other Ambulatory Visit: Payer: Self-pay | Admitting: Internal Medicine

## 2019-07-01 ENCOUNTER — Other Ambulatory Visit (HOSPITAL_COMMUNITY): Payer: 59

## 2019-07-01 NOTE — Telephone Encounter (Signed)
Last ov:06/10/2019 Last filled:04/15/2019

## 2019-07-05 ENCOUNTER — Ambulatory Visit: Payer: 59 | Admitting: Cardiovascular Disease

## 2019-07-08 ENCOUNTER — Telehealth: Payer: Self-pay | Admitting: Cardiovascular Disease

## 2019-07-08 NOTE — Telephone Encounter (Signed)
[id number] 

## 2019-07-08 NOTE — Telephone Encounter (Signed)
New Message:   Pt says she is unable to have her Echo on 07-12-19. She wants to know if she still herr appt on 07-16-19 with Dr Allyson Sabal? It could be 30 days or more before Echo can be rescheduled. She waiting for a Prior Authorization to have it.ay

## 2019-07-09 NOTE — Telephone Encounter (Signed)
Left message for patient, it is up to her what she would like to do. If she is having no problems and would like him to have the echo results when she sees him, she can call and cancel appointment. Or she can keep the appointment and discuss any concerns or problems she is having. She is to call if she would like to cancel appointment.

## 2019-07-11 ENCOUNTER — Encounter (HOSPITAL_COMMUNITY): Payer: Self-pay | Admitting: Interventional Cardiology

## 2019-07-12 ENCOUNTER — Other Ambulatory Visit (HOSPITAL_COMMUNITY): Payer: 59

## 2019-07-16 ENCOUNTER — Ambulatory Visit: Payer: 59 | Admitting: Cardiovascular Disease

## 2019-07-19 ENCOUNTER — Ambulatory Visit: Payer: 59 | Attending: Internal Medicine

## 2019-07-19 DIAGNOSIS — Z23 Encounter for immunization: Secondary | ICD-10-CM

## 2019-07-19 NOTE — Progress Notes (Signed)
   Covid-19 Vaccination Clinic  Name:  KEIRA BOHLIN    MRN: [id number] DOB: 30-Aug-1955  07/19/2019  Ms. Uppal was observed post Covid-19 immunization for without incident. She was provided with Vaccine Information Sheet and instruction to access the V-Safe system.   Ms. Caison was instructed to call 911 with any severe reactions post vaccine: Marland Kitchen Difficulty breathing  . Swelling of face and throat  . A fast heartbeat  . A bad rash all over body  . Dizziness and weakness   Immunizations Administered    Name Date Dose VIS Date Route   Pfizer COVID-19 Vaccine 07/19/2019  8:37 AM 0.3 mL 04/19/2019 Intramuscular   Manufacturer: [employer], [employer]   Lot: [id number]   NDC: [address]-1

## 2019-07-22 ENCOUNTER — Other Ambulatory Visit: Payer: Self-pay

## 2019-07-22 ENCOUNTER — Ambulatory Visit
Admission: RE | Admit: 2019-07-22 | Discharge: 2019-07-22 | Disposition: A | Discharge status: Home / Self Care | Payer: 59 | Source: Ambulatory Visit | Attending: Internal Medicine | Admitting: Internal Medicine

## 2019-07-22 DIAGNOSIS — Z1231 Encounter for screening mammogram for malignant neoplasm of breast: Secondary | ICD-10-CM

## 2019-07-30 ENCOUNTER — Telehealth: Payer: Self-pay | Admitting: Internal Medicine

## 2019-07-30 NOTE — Telephone Encounter (Signed)
Pt requested to only see a female doctor for issue. Pt has been scheduled with Dr.Jordan. Ok by Dr.Jordan.

## 2019-07-30 NOTE — Telephone Encounter (Signed)
Pt has another bacteria infection and would like a cream called into  [facility].

## 2019-07-31 ENCOUNTER — Other Ambulatory Visit: Payer: Self-pay

## 2019-07-31 ENCOUNTER — Ambulatory Visit (INDEPENDENT_AMBULATORY_CARE_PROVIDER_SITE_OTHER): Payer: 59 | Admitting: Family Medicine

## 2019-07-31 ENCOUNTER — Encounter: Payer: Self-pay | Admitting: Family Medicine

## 2019-07-31 VITALS — BP 130/70 | HR 68 | Resp 12 | Ht 61.0 in | Wt 161.0 lb

## 2019-07-31 DIAGNOSIS — K580 Irritable bowel syndrome with diarrhea: Secondary | ICD-10-CM | POA: Diagnosis not present

## 2019-07-31 DIAGNOSIS — L304 Erythema intertrigo: Secondary | ICD-10-CM | POA: Diagnosis not present

## 2019-07-31 DIAGNOSIS — N898 Other specified noninflammatory disorders of vagina: Secondary | ICD-10-CM

## 2019-07-31 MED ORDER — NYSTATIN 100000 UNIT/GM EX POWD: 1.0000 "application " | Freq: Three times a day (TID) | CUTANEOUS | 1 refills | Status: DC

## 2019-07-31 MED ORDER — FLUCONAZOLE 150 MG PO TABS: 150.0000 mg | ORAL_TABLET | ORAL | 0 refills | Status: AC

## 2019-07-31 MED ORDER — NYSTATIN-TRIAMCINOLONE 100000-0.1 UNIT/GM-% EX CREA: 1.0000 "application " | TOPICAL_CREAM | Freq: Two times a day (BID) | CUTANEOUS | 0 refills | Status: DC | PRN

## 2019-07-31 NOTE — Progress Notes (Signed)
ACUTE VISIT    Chief Complaint  Patient presents with  . Vaginal Discharge     Ms.Suttyn Tamer Haider is a 64 y.o. female, who is here today complaining of intermittent episodes of perineal pruritus. She has had problem for a while. It seems to be exacerbated by heat/humidity. She has not identified alleviating factors.  In the past she has been prescribed a cream for yeast, which helped some.  There is on burning sensation with urine makes contact with perineal skin.  Negative for increased frequency, urgency, gross hematuria, or vaginal bleeding. There is no fever, chills, changes in appetite, abdominal pain, nausea, vomiting, or changes in bowel habits. Negative for tenderness and local edema on affected area.  Also for a while she has also felt liquid vaginal discharge but she has not noted any on panties or pads. Not sure if it is related to above problem. She is not sexually active.  She has seen blood on tissue after defecation.  She reported history of hemorrhoids. Chronic diarrhea, IBS. Not sure about exacerbating factors.  Review of Systems  Constitutional: Negative for activity change and fatigue.  Respiratory: Negative for shortness of breath.   Cardiovascular: Negative for chest pain and palpitations.  Genitourinary: Negative for decreased urine volume, difficulty urinating, flank pain and genital sores.  Musculoskeletal: Positive for arthralgias. Negative for back pain and myalgias.  Neurological: Negative for weakness and numbness.  Psychiatric/Behavioral: Negative for confusion. The patient is nervous/anxious.   Rest see pertinent positives and negatives per HPI.  Current Outpatient Medications on File Prior to Visit  Medication Sig Dispense Refill  . ALPRAZolam (XANAX) 0.5 MG tablet TAKE ONE TABLET BY MOUTH THREE TIMES A DAY AS NEEDED FOR ANXIETY MAX 3 TABLETS PER 24 HOURS 30 tablet 0  . Calcium Carb-Cholecalciferol (CALCIUM+D3) 600-800 MG-UNIT  TABS Take 1 tablet by mouth daily with breakfast.    . citalopram (CELEXA) 20 MG tablet TAKE ONE TABLET BY MOUTH DAILY 90 tablet 2  . dicyclomine (BENTYL) 10 MG capsule Take 1 capsule (10 mg total) by mouth every 6 (six) hours as needed (abdominal spasms). 60 capsule 2  . diphenhydrAMINE (BENADRYL) 25 MG tablet Take 25 mg by mouth at bedtime as needed for allergies or sleep.     . metoprolol tartrate (LOPRESSOR) 25 MG tablet TAKE ONE TABLET BY MOUTH TWICE A DAY 180 tablet 3  . metroNIDAZOLE (FLAGYL) 500 MG tablet     . metroNIDAZOLE (METROGEL VAGINAL) 0.75 % vaginal gel Place 1 Applicatorful vaginally 2 (two) times daily. 70 g 0  . Multiple Vitamins-Minerals (ALIVE ONCE DAILY WOMENS 50+) TABS Take 1 tablet by mouth daily.    . naproxen sodium (ANAPROX) 220 MG tablet Take 440 mg by mouth every morning.     Marland Kitchen PROAIR HFA 108 (90 Base) MCG/ACT inhaler INHALE TWO PUFFS BY MOUTH EVERY 6 HOURS AS NEEDED FOR WHEEZING OR SHORTNESS OF BREATH (Patient taking differently: Inhale 2 puffs into the lungs every 6 (six) hours as needed for wheezing or shortness of breath. ) 8.5 g 0  . Probiotic Product (TRUBIOTICS) CAPS Take 1 capsule by mouth daily.    . rosuvastatin (CRESTOR) 5 MG tablet Take 1 tablet (5 mg total) by mouth daily. 90 tablet 3   No current facility-administered medications on file prior to visit.   Past Medical History:  Diagnosis Date  . Anxiety   . Arthritis    R knee, back, hands   . Cellulitis of knee 04/2016  RT KNEE  . Cellulitis of right knee 04/18/2016  . Childhood asthma   . Complication of anesthesia    woke up slowly - 68yrs. ago  . Depression    related to husband illness and death  . History of jaundice    as a child    . Hx of varicella   . Hyperthyroidism    during pregnancy, treated & resolved post partum   . IBS (irritable bowel syndrome)   . LBBB (left bundle branch block)   . Scoliosis   . Scoliosis 07/2015  . Thyroid disease in pregnancy   . TOBACCO  DEPENDENCE 07/06/2006   Qualifier: Diagnosis of  By: Samara Snide    . Troponin level elevated    Allergies  Allergen Reactions  . Codeine Itching, Swelling, Rash and Other (See Comments)  . Lactose Intolerance (Gi) Diarrhea   Social History   Socioeconomic History  . Marital status: Widowed    Spouse name: Not on file  . Number of children: 3  . Years of education: Not on file  . Highest education level: Not on file  Occupational History  . Occupation: self    Comment: Designer, jewellery  Tobacco Use  . Smoking status: Former Smoker    Packs/day: 0.25    Years: 40.00    Pack years: 10.00    Types: Cigarettes    Quit date: 01/06/2016    Years since quitting: 3.5  . Smokeless tobacco: Never Used  . Tobacco comment: none in about 1 week  Substance and Sexual Activity  . Alcohol use: Yes    Alcohol/week: 0.0 standard drinks    Comment: socially-wine- daily  . Drug use: No  . Sexual activity: Not on file  Other Topics Concern  . Not on file  Social History Narrative   Husband passed away on 02-18-2008 hep c and cirrhosis.   She is hep c negative 2006   G3P3   HH of 2-3  son no pets    No falls .  Has smoke detector and wears seat belts. POsitive  firearms. No excess sun exposure.    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   Had time of nono insuranced and cannot affort 800 per month for catastrophic    Stopped tobacco fall 2017 . ocass etoh.  No rec drugs.   Daughter patient in our practice   Sheppard Coil   Social Determinants of Health   Financial Resource Strain:   . Difficulty of Paying Living Expenses:   Food Insecurity:   . Worried About Charity fundraiser in the Last Year:   . Arboriculturist in the Last Year:   Transportation Needs:   . Film/video editor (Medical):   Marland Kitchen Lack of Transportation (Non-Medical):   Physical Activity:   . Days of Exercise per Week:   . Minutes of Exercise per Session:   Stress:   . Feeling of Stress :    Social Connections:   . Frequency of Communication with Friends and Family:   . Frequency of Social Gatherings with Friends and Family:   . Attends Religious Services:   . Active Member of Clubs or Organizations:   . Attends Archivist Meetings:   Marland Kitchen Marital Status:     Vitals:   07/31/19 1413  BP: 130/70  Pulse: 68  Resp: 12  SpO2: 97%   Body mass index is 30.42 kg/m.  Physical Exam  Nursing note and vitals reviewed. Constitutional: She is oriented to person, place, and time. She appears well-developed. No distress.  HENT:  Head: Normocephalic and atraumatic.  Mouth/Throat: Oropharynx is clear and moist and mucous membranes are normal.  Eyes: Conjunctivae are normal.  Respiratory: Effort normal and breath sounds normal. No respiratory distress.  GI: She exhibits no mass. There is no abdominal tenderness.  Genitourinary: Rectum:     No anal fissure or tenderness.  There is no tenderness or lesion on the right labia. There is no tenderness or lesion on the left labia.    No vaginal discharge.     Genitourinary Comments: Skin tag x 1.   Musculoskeletal:        General: No edema.  Neurological: She is alert and oriented to person, place, and time. She has normal strength. Gait normal.  Skin: Skin is warm. Rash noted. There is erythema.  Erythema on external genitals and perineal area , including perianal. Perianal superficial excoriations.  Psychiatric: She has a normal mood and affect.  Well groomed, good eye contact.   ASSESSMENT AND PLAN:   [name] was seen today for vaginal discharge.  Diagnoses and all orders for this visit:  Pruritic intertrigo Educated about diagnosis, prognosis, and treatment options. Diflucan 150 mg weekly x2 recommended. Monitor for signs of infection. General recommendations given to try to prevent frequent recurrences.  -     nystatin-triamcinolone (MYCOLOG II) cream; Apply 1 application topically 2 (two) times daily as  needed.       -     nystatin (MYCOSTATIN/NYSTOP) powder; Apply 1 application topically 3 (three) times daily.       - fluconazole (DIFLUCAN) 150 MG tablet; Take 1 tablet (150 mg total) by mouth once a week for 2 doses.  Dispense: 2        tablet; Refill: 0   Vaginal discharge I do not appreciate discharge on examination today. No risk factor for STDs and hx does not suggest candida vaginatisi ?  Postmenopausal discharge.  Irritable bowel syndrome with diarrhea It does not seem to be well controlled. Adequate hydration. Recommend considering low residua diet.   Return in about 4 weeks (around 08/28/2019), or if symptoms worsen or fail to improve, for pcp.  Murlene Revell G. [place], MD  [facility]. [place] office.   A few things to remember from today's visit:   Pruritic intertrigo - Plan: nystatin-triamcinolone (MYCOLOG II) cream, nystatin (MYCOSTATIN/NYSTOP) powder  Keep area dry. Use an antibacterial soap.  Intertrigo Intertrigo is skin irritation (inflammation) that happens in warm, moist areas of the body. The irritation can cause a rash and make skin raw and itchy. The rash is usually pink or red. It happens mostly between folds of skin or where skin rubs together, such as:  Between the toes.  In the armpits.  In the groin area.  Under the belly.  Under the breasts.  Around the butt area. This condition is not passed from person to person (is not contagious). What are the causes?  Heat, moisture, rubbing, and not enough air movement.  The condition can be made worse by: ? Sweat. ? Bacteria. ? A fungus, such as yeast. What increases the risk?  Moisture in your skin folds.  You are more likely to develop this condition if you: ? Have diabetes. ? Are overweight. ? Are not able to move around. ? Live in a warm and moist climate. ? Wear splints, braces, or other medical devices. ? Are not  able to control your pee (urine) or poop (stool). What are the  signs or symptoms?  A pink or red skin rash in the skin fold or near the skin fold.  Raw or scaly skin.  Itching.  A burning feeling.  Bleeding.  Leaking fluid.  A bad smell. How is this treated?  Cleaning and drying your skin.  Taking an antibiotic medicine or using an antibiotic skin cream for a bacterial infection.  Using an antifungal cream on your skin or taking pills for an infection that was caused by a fungus, such as yeast.  Using a steroid ointment to stop the itching and irritation.  Separating the skin fold with a clean cotton cloth to absorb moisture and allow air to flow into the area. Follow these instructions at home:  Keep the affected area clean and dry.  Do not scratch your skin.  Stay cool as much as you can. Use an air conditioner or a fan, if you have one.  Apply over-the-counter and prescription medicines only as told by your doctor.  If you were prescribed an antibiotic medicine, use it as told by your doctor. Do not stop using the antibiotic even if your condition starts to get better.  Keep all follow-up visits as told by your doctor. This is important. How is this prevented?   Stay at a healthy weight.  Take care of your feet. This is very important if you have diabetes. You should: ? Wear shoes that fit well. ? Keep your feet dry. ? Wear clean cotton or wool socks.  Protect the skin in your groin and butt area as told by your doctor. To do this: ? Follow a regular cleaning routine. ? Use creams, powders, or ointments that protect your skin. ? Change protection pads often.  Do not wear tight clothes. Wear clothes that: ? Are loose. ? Take moisture away from your body. ? Are made of cotton.  Wear a bra that gives good support, if needed.  Shower and dry yourself well after being active. Use a hair dryer on a cool setting to dry between skin folds.  Keep your blood sugar under control if you have diabetes. Contact a doctor  if:  Your symptoms do not get better with treatment.  Your symptoms get worse or they spread.  You notice more redness and warmth.  You have a fever. Summary  Intertrigo is skin irritation that occurs when folds of skin rub together.  This condition is caused by heat, moisture, and rubbing.  This condition may be treated by cleaning and drying your skin and with medicines.  Apply over-the-counter and prescription medicines only as told by your doctor.  Keep all follow-up visits as told by your doctor. This is important. This information is not intended to replace advice given to you by your health care provider. Make sure you discuss any questions you have with your health care provider. Document Revised: 02/01/2018 Document Reviewed: 02/01/2018 Elsevier Patient Education  2020 [employer].  Please be sure medication list is accurate. If a new problem present, please set up appointment sooner than planned today.

## 2019-07-31 NOTE — Patient Instructions (Signed)
A few things to remember from today's visit:   Pruritic intertrigo - Plan: nystatin-triamcinolone (MYCOLOG II) cream, nystatin (MYCOSTATIN/NYSTOP) powder  Keep area dry. Use an antibacterial soap.  Intertrigo Intertrigo is skin irritation (inflammation) that happens in warm, moist areas of the body. The irritation can cause a rash and make skin raw and itchy. The rash is usually pink or red. It happens mostly between folds of skin or where skin rubs together, such as:  Between the toes.  In the armpits.  In the groin area.  Under the belly.  Under the breasts.  Around the butt area. This condition is not passed from person to person (is not contagious). What are the causes?  Heat, moisture, rubbing, and not enough air movement.  The condition can be made worse by: ? Sweat. ? Bacteria. ? A fungus, such as yeast. What increases the risk?  Moisture in your skin folds.  You are more likely to develop this condition if you: ? Have diabetes. ? Are overweight. ? Are not able to move around. ? Live in a warm and moist climate. ? Wear splints, braces, or other medical devices. ? Are not able to control your pee (urine) or poop (stool). What are the signs or symptoms?  A pink or red skin rash in the skin fold or near the skin fold.  Raw or scaly skin.  Itching.  A burning feeling.  Bleeding.  Leaking fluid.  A bad smell. How is this treated?  Cleaning and drying your skin.  Taking an antibiotic medicine or using an antibiotic skin cream for a bacterial infection.  Using an antifungal cream on your skin or taking pills for an infection that was caused by a fungus, such as yeast.  Using a steroid ointment to stop the itching and irritation.  Separating the skin fold with a clean cotton cloth to absorb moisture and allow air to flow into the area. Follow these instructions at home:  Keep the affected area clean and dry.  Do not scratch your skin.  Stay  cool as much as you can. Use an air conditioner or a fan, if you have one.  Apply over-the-counter and prescription medicines only as told by your doctor.  If you were prescribed an antibiotic medicine, use it as told by your doctor. Do not stop using the antibiotic even if your condition starts to get better.  Keep all follow-up visits as told by your doctor. This is important. How is this prevented?   Stay at a healthy weight.  Take care of your feet. This is very important if you have diabetes. You should: ? Wear shoes that fit well. ? Keep your feet dry. ? Wear clean cotton or wool socks.  Protect the skin in your groin and butt area as told by your doctor. To do this: ? Follow a regular cleaning routine. ? Use creams, powders, or ointments that protect your skin. ? Change protection pads often.  Do not wear tight clothes. Wear clothes that: ? Are loose. ? Take moisture away from your body. ? Are made of cotton.  Wear a bra that gives good support, if needed.  Shower and dry yourself well after being active. Use a hair dryer on a cool setting to dry between skin folds.  Keep your blood sugar under control if you have diabetes. Contact a doctor if:  Your symptoms do not get better with treatment.  Your symptoms get worse or they spread.  You notice more  redness and warmth.  You have a fever. Summary  Intertrigo is skin irritation that occurs when folds of skin rub together.  This condition is caused by heat, moisture, and rubbing.  This condition may be treated by cleaning and drying your skin and with medicines.  Apply over-the-counter and prescription medicines only as told by your doctor.  Keep all follow-up visits as told by your doctor. This is important. This information is not intended to replace advice given to you by your health care provider. Make sure you discuss any questions you have with your health care provider. Document Revised: 02/01/2018  Document Reviewed: 02/01/2018 Elsevier Patient Education  2020 [employer].  Please be sure medication list is accurate. If a new problem present, please set up appointment sooner than planned today.

## 2019-08-08 LAB — HEPATIC FUNCTION PANEL
ALT: 34 IU/L — ABNORMAL HIGH (ref 0–32)
AST: 33 IU/L (ref 0–40)
Albumin: 4.2 g/dL (ref 3.8–4.8)
Alkaline Phosphatase: 69 IU/L (ref 39–117)
Bilirubin Total: 0.4 mg/dL (ref 0.0–1.2)
Bilirubin, Direct: 0.14 mg/dL (ref 0.00–0.40)
Total Protein: 6.5 g/dL (ref 6.0–8.5)

## 2019-08-08 LAB — LIPID PANEL
Chol/HDL Ratio: 2.4 ratio (ref 0.0–4.4)
Cholesterol, Total: 174 mg/dL (ref 100–199)
HDL: 74 mg/dL (ref >39)
LDL Chol Calc (NIH): 82 mg/dL (ref 0–99)
Triglycerides: 103 mg/dL (ref 0–149)
VLDL Cholesterol Cal: 18 mg/dL (ref 5–40)

## 2019-08-12 ENCOUNTER — Ambulatory Visit: Payer: 59 | Attending: Internal Medicine

## 2019-08-12 DIAGNOSIS — Z23 Encounter for immunization: Secondary | ICD-10-CM

## 2019-08-12 NOTE — Progress Notes (Signed)
   Covid-19 Vaccination Clinic  Name:  Valerie Love    MRN: [id number] DOB: 1955/12/29  08/12/2019  [name] was observed post Covid-19 immunization for 30 minutes based on pre-vaccination screening without incident. She was provided with Vaccine Information Sheet and instruction to access the V-Safe system.   [name] was instructed to call 911 with any severe reactions post vaccine: Marland Kitchen Difficulty breathing  . Swelling of face and throat  . A fast heartbeat  . A bad rash all over body  . Dizziness and weakness   Immunizations Administered    Name Date Dose VIS Date Route   Pfizer COVID-19 Vaccine 08/12/2019  1:36 PM 0.3 mL 04/19/2019 Intramuscular   Manufacturer: [employer], [employer]   Lot: [id number]   NDC: [address]-0

## 2019-09-10 ENCOUNTER — Other Ambulatory Visit: Payer: Self-pay

## 2019-09-10 ENCOUNTER — Ambulatory Visit (HOSPITAL_COMMUNITY): Payer: 59 | Attending: Cardiology

## 2019-09-10 DIAGNOSIS — I519 Heart disease, unspecified: Secondary | ICD-10-CM | POA: Diagnosis not present

## 2019-09-24 ENCOUNTER — Ambulatory Visit (INDEPENDENT_AMBULATORY_CARE_PROVIDER_SITE_OTHER): Payer: 59 | Admitting: Cardiovascular Disease

## 2019-09-24 ENCOUNTER — Other Ambulatory Visit: Payer: Self-pay

## 2019-09-24 ENCOUNTER — Encounter: Payer: Self-pay | Admitting: Cardiovascular Disease

## 2019-09-24 VITALS — BP 128/84 | HR 60 | Ht 61.0 in | Wt 161.0 lb

## 2019-09-24 DIAGNOSIS — I214 Non-ST elevation (NSTEMI) myocardial infarction: Secondary | ICD-10-CM | POA: Diagnosis not present

## 2019-09-24 DIAGNOSIS — R9431 Abnormal electrocardiogram [ECG] [EKG]: Secondary | ICD-10-CM

## 2019-09-24 DIAGNOSIS — R002 Palpitations: Secondary | ICD-10-CM

## 2019-09-24 DIAGNOSIS — I5021 Acute systolic (congestive) heart failure: Secondary | ICD-10-CM | POA: Diagnosis not present

## 2019-09-24 DIAGNOSIS — E782 Mixed hyperlipidemia: Secondary | ICD-10-CM

## 2019-09-24 NOTE — Assessment & Plan Note (Signed)
History of palpitations most likely related to PVCs on low-dose beta-blocker.  These have markedly improved.  She has decreased her caffeine intake but not eliminated this.  She drinks 1 caffeinated cup of coffee a day and several decaf.  We talked about continue to curb her caffeine intake.

## 2019-09-24 NOTE — Progress Notes (Signed)
09/24/2019 Valerie Love   [birth year]  [id number]  Primary Physician Panosh, Standley Brooking, MD Primary Cardiologist: Lorretta Harp MD Lupe Carney, [place]  HPI:  Valerie Love is a 64 y.o.  widowed Caucasian female mother of 3 children, grandmother of 3 grandchildren who I last spoke to for a virtual telemedicine phone visit 01/18/2019. She was hospitalized 10/30/2018 for 3 days for a non-STEMI, chest pain and what was presumably PSVT.  She does have left bundle branch block.  She apparently had a Myoview stress test performed 5/12 revealing EF of 48% with fixed septal defect consistent with bundle branch block artifact.  She does not smoke.  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  During her hospitalization her troponins rose to about 1000.  Based on this she underwent diagnostic coronary angiography 10/31/2018 which was entirely normal with normal coronary arteries.  EF was 40 to 45% with septal dyssynchrony.  She was seen by Dr. Curt Bears for EP evaluation who placed her on a beta-blocker.  Since discharge she has been doing her normal work routine, walks and works in her garden without significant palpitations.  She says the beta-blocker has improved this.  If she has recurrent prolonged symptoms we talked about the possibility of loop recorder implantation.  She did have an event monitor which resulted 12/19/2018 that showed only occasional PVCs.  Since I saw her 8 months ago she continues to do well.  She remains on her beta-blocker.  She did start rosuvastatin with marked improvement in her lipid profile.  Most recently performed 08/08/2019 revealing total cholesterol 174, LDL of 82 and HDL 74.  She has decreased her caffeine intake although she does continue to drink some caffeine and complain of occasional palpitations markedly improved from previously.  A 2D echocardiogram performed 09/10/2019 showed normalization of her LV function.  Current Meds  Medication Sig  .  ALPRAZolam (XANAX) 0.5 MG tablet TAKE ONE TABLET BY MOUTH THREE TIMES A DAY AS NEEDED FOR ANXIETY MAX 3 TABLETS PER 24 HOURS  . Calcium Carb-Cholecalciferol (CALCIUM+D3) 600-800 MG-UNIT TABS Take 1 tablet by mouth daily with breakfast.  . citalopram (CELEXA) 20 MG tablet TAKE ONE TABLET BY MOUTH DAILY  . dicyclomine (BENTYL) 10 MG capsule Take 1 capsule (10 mg total) by mouth every 6 (six) hours as needed (abdominal spasms).  . diphenhydrAMINE (BENADRYL) 25 MG tablet Take 25 mg by mouth at bedtime as needed for allergies or sleep.   . metoprolol tartrate (LOPRESSOR) 25 MG tablet TAKE ONE TABLET BY MOUTH TWICE A DAY  . metroNIDAZOLE (FLAGYL) 500 MG tablet   . metroNIDAZOLE (METROGEL VAGINAL) 0.75 % vaginal gel Place 1 Applicatorful vaginally 2 (two) times daily.  . Multiple Vitamins-Minerals (ALIVE ONCE DAILY WOMENS 50+) TABS Take 1 tablet by mouth daily.  . naproxen sodium (ANAPROX) 220 MG tablet Take 440 mg by mouth every morning.   . nystatin (MYCOSTATIN/NYSTOP) powder Apply 1 application topically 3 (three) times daily.  Marland Kitchen nystatin-triamcinolone (MYCOLOG II) cream Apply 1 application topically 2 (two) times daily as needed.  Marland Kitchen PROAIR HFA 108 (90 Base) MCG/ACT inhaler INHALE TWO PUFFS BY MOUTH EVERY 6 HOURS AS NEEDED FOR WHEEZING OR SHORTNESS OF BREATH (Patient taking differently: Inhale 2 puffs into the lungs every 6 (six) hours as needed for wheezing or shortness of breath. )  . Probiotic Product (TRUBIOTICS) CAPS Take 1 capsule by mouth daily.     Allergies  Allergen Reactions  . Codeine Itching,  Swelling, Rash and Other (See Comments)  . Lactose Intolerance (Gi) Diarrhea    Social History   Socioeconomic History  . Marital status: Widowed    Spouse name: Not on file  . Number of children: 3  . Years of education: Not on file  . Highest education level: Not on file  Occupational History  . Occupation: self    Comment: Geneticist, molecular  Tobacco Use  . Smoking status: Former  Smoker    Packs/day: 0.25    Years: 40.00    Pack years: 10.00    Types: Cigarettes    Quit date: 01/06/2016    Years since quitting: 3.7  . Smokeless tobacco: Never Used  . Tobacco comment: none in about 1 week  Substance and Sexual Activity  . Alcohol use: Yes    Alcohol/week: 0.0 standard drinks    Comment: socially-wine- daily  . Drug use: No  . Sexual activity: Not on file  Other Topics Concern  . Not on file  Social History Narrative   Husband passed away on 2008-02-18 hep c and cirrhosis.   She is hep c negative 2006   G3P3   HH of 2-3  son no pets    No falls .  Has smoke detector and wears seat belts. POsitive  firearms. No excess sun exposure.    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   Had time of nono insuranced and cannot affort 800 per month for catastrophic    Stopped tobacco fall 2017 . ocass etoh.  No rec drugs.   Daughter patient in our practice   [name]   Social Determinants of Health   Financial Resource Strain:   . Difficulty of Paying Living Expenses:   Food Insecurity:   . Worried About Programme researcher, broadcasting/film/video in the Last Year:   . Barista in the Last Year:   Transportation Needs:   . Freight forwarder (Medical):   Marland Kitchen Lack of Transportation (Non-Medical):   Physical Activity:   . Days of Exercise per Week:   . Minutes of Exercise per Session:   Stress:   . Feeling of Stress :   Social Connections:   . Frequency of Communication with Friends and Family:   . Frequency of Social Gatherings with Friends and Family:   . Attends Religious Services:   . Active Member of Clubs or Organizations:   . Attends Banker Meetings:   Marland Kitchen Marital Status:   Intimate Partner Violence:   . Fear of Current or Ex-Partner:   . Emotionally Abused:   Marland Kitchen Physically Abused:   . Sexually Abused:      Review of Systems: General: negative for chills, fever, night sweats or weight changes.  Cardiovascular: negative for  chest pain, dyspnea on exertion, edema, orthopnea, palpitations, paroxysmal nocturnal dyspnea or shortness of breath Dermatological: negative for rash Respiratory: negative for cough or wheezing Urologic: negative for hematuria Abdominal: negative for nausea, vomiting, diarrhea, bright red blood per rectum, melena, or hematemesis Neurologic: negative for visual changes, syncope, or dizziness All other systems reviewed and are otherwise negative except as noted above.    Blood pressure 128/84, pulse 60, height 5\' 1"  (1.549 m), weight 161 lb (73 kg).  General appearance: alert and no distress Neck: no adenopathy, no carotid bruit, no JVD, supple, symmetrical, trachea midline and thyroid not enlarged, symmetric, no tenderness/mass/nodules Lungs: clear to auscultation bilaterally Heart: regular rate and  rhythm, S1, S2 normal, no murmur, click, rub or gallop Extremities: extremities normal, atraumatic, no cyanosis or edema Pulses: 2+ and symmetric Skin: Skin color, texture, turgor normal. No rashes or lesions Neurologic: Alert and oriented X 3, normal strength and tone. Normal symmetric reflexes. Normal coordination and gait  EKG sinus rhythm at 60 with left bundle branch block.  I personally reviewed this EKG.  ASSESSMENT AND PLAN:   Palpitations History of palpitations most likely related to PVCs on low-dose beta-blocker.  These have markedly improved.  She has decreased her caffeine intake but not eliminated this.  She drinks 1 caffeinated cup of coffee a day and several decaf.  We talked about continue to curb her caffeine intake.  EKG, abnormal Left bundle branch block, chronic  Hyperlipidemia History of hyperlipidemia on low-dose rosuvastatin with lipid profile performed 08/08/2019 revealing total cholesterol 174, LDL 82 and HDL 74.  Acute systolic heart failure (HCC) History of non-STEMI in the past which led to a diagnostic coronary angiogram 10/31/2018 which was entirely normal.   Her EF at that time was 40 to 45%.  She has been on beta-blocker since in her recent echo performed 09/10/2019 revealed normalization of her LV function.      Runell Gess MD FACP,FACC,FAHA, [facility] 09/24/2019 9:30 AM

## 2019-09-24 NOTE — Assessment & Plan Note (Signed)
History of hyperlipidemia on low-dose rosuvastatin with lipid profile performed 08/08/2019 revealing total cholesterol 174, LDL 82 and HDL 74.

## 2019-09-24 NOTE — Assessment & Plan Note (Signed)
History of non-STEMI in the past which led to a diagnostic coronary angiogram 10/31/2018 which was entirely normal.  Her EF at that time was 40 to 45%.  She has been on beta-blocker since in her recent echo performed 09/10/2019 revealed normalization of her LV function.

## 2019-09-24 NOTE — Assessment & Plan Note (Signed)
Left bundle branch block, chronic

## 2019-09-24 NOTE — Patient Instructions (Addendum)
Medication Instructions:  Your physician recommends that you continue on your current medications as directed. Please refer to the Current Medication list given to you today.  *If you need a refill on your cardiac medications before your next appointment, please call your pharmacy*    Follow-Up: At [facility], you and your health needs are our priority.  As part of our continuing mission to provide you with exceptional heart care, we have created designated Provider Care Teams.  These Care Teams include your primary Cardiologist (physician) and Advanced Practice Providers (APPs -  Physician Assistants and Nurse Practitioners) who all work together to provide you with the care you need, when you need it.  We recommend signing up for the patient portal called "MyChart".  Sign up information is provided on this After Visit Summary.  MyChart is used to connect with patients for Virtual Visits (Telemedicine).  Patients are able to view lab/test results, encounter notes, upcoming appointments, etc.  Non-urgent messages can be sent to your provider as well.   To learn more about what you can do with MyChart, go to [email or web address].    Your next appointment:   12 month(s)  The format for your next appointment:   In Person  Provider:   You may see Nanetta Batty, MD or one of the following Advanced Practice Providers on your designated Care Team:    Corine Shelter, PA-C  [place], [place]  Edd Fabian, [place]    Other Instructions Please call our office 2 months in advance to scheduled your follow-up appointment.

## 2019-10-31 ENCOUNTER — Other Ambulatory Visit: Payer: Self-pay | Admitting: Internal Medicine

## 2019-10-31 NOTE — Telephone Encounter (Signed)
This is not on patients current med list.  Last OV 07/31/2019  Please advise if OK to fill

## 2019-12-20 ENCOUNTER — Other Ambulatory Visit: Payer: Self-pay | Admitting: Cardiology

## 2020-01-10 ENCOUNTER — Other Ambulatory Visit: Payer: Self-pay | Admitting: Internal Medicine

## 2020-02-27 ENCOUNTER — Other Ambulatory Visit: Payer: Self-pay | Admitting: Internal Medicine

## 2020-03-05 ENCOUNTER — Telehealth: Payer: Self-pay

## 2020-03-05 NOTE — Telephone Encounter (Signed)
Please call pt and make an appt for eval. She has not been seen since 2018. Thanks

## 2020-03-05 NOTE — Telephone Encounter (Signed)
Patient called she is requesting anti inflammatory medication for her back and knee call back:[phone]

## 2020-03-16 ENCOUNTER — Ambulatory Visit (INDEPENDENT_AMBULATORY_CARE_PROVIDER_SITE_OTHER): Payer: 59

## 2020-03-16 ENCOUNTER — Encounter: Payer: Self-pay | Admitting: Orthopedic Surgery

## 2020-03-16 ENCOUNTER — Ambulatory Visit (INDEPENDENT_AMBULATORY_CARE_PROVIDER_SITE_OTHER): Payer: 59 | Admitting: Orthopedic Surgery

## 2020-03-16 VITALS — Ht 61.0 in | Wt 161.0 lb

## 2020-03-16 DIAGNOSIS — G8929 Other chronic pain: Secondary | ICD-10-CM | POA: Diagnosis not present

## 2020-03-16 DIAGNOSIS — M545 Low back pain, unspecified: Secondary | ICD-10-CM | POA: Diagnosis not present

## 2020-03-16 DIAGNOSIS — M542 Cervicalgia: Secondary | ICD-10-CM

## 2020-03-16 MED ORDER — PREDNISONE 10 MG PO TABS: 20.0000 mg | ORAL_TABLET | Freq: Every day | ORAL | 0 refills | Status: DC

## 2020-03-16 NOTE — Progress Notes (Signed)
Office Visit Note   Patient: Valerie Love           Date of Birth: 1956-01-28           MRN: [id number] Visit Date: 03/16/2020              Requested by: Madelin Headings, MD [address] [place],  [place] [address] PCP: Madelin Headings, MD  Chief Complaint  Patient presents with  . Lower Back - Pain      HPI: Patient is a 64 year old woman who presents complaining of increasing pain in her cervical and lumbar spine she denies any radicular pain into her arms or legs.  Patient states that she has tried exercise conditioning with a stationary bicycle.  She has not done any resistive training she has tried Aleve without relief.  She states she has difficulty with her activities of daily living at home.  Assessment & Plan: Visit Diagnoses:  1. Neck pain, chronic   2. Chronic midline low back pain without sciatica     Plan: Will start patient on prednisone 20 mg with breakfast and she will wean off as her symptoms improved.  Discussed that we may need to obtain an MRI scan of her cervical or lumbar spine.  Discussed that she may need a epidural steroid injection.  Follow-Up Instructions: Return in about 4 weeks (around 04/13/2020).   Ortho Exam  Patient is alert, oriented, no adenopathy, well-dressed, normal affect, normal respiratory effort.  Examination patient has no focal motor weakness in her upper or lower extremities negative straight leg raise.  Review of the radiographs shows extensive arthritic changes of the cervical and lumbar spine.   Imaging: XR Cervical Spine 2 or 3 views  Result Date: 03/16/2020 2 view radiographs of the cervical spine shows a loss of the cervical lordosis with complete collapse of the disc space between cervical vertebrae 4,5,6 and 7 with osteophytic bone spurs.  XR Lumbar Spine 2-3 Views  Result Date: 03/16/2020 2 view radiographs of the lumbar spine shows a degenerative scoliosis with essentially complete collapse of the disc  space throughout the entire lumbar spine without compression fractures with periarticular bony spurs  No images are attached to the encounter.  Labs: Lab Results  Component Value Date   HGBA1C 5.6 06/10/2019   HGBA1C 5.6 06/13/2018   ESRSEDRATE 17 04/18/2016   ESRSEDRATE 9 08/18/2010   CRP 0.8 04/18/2016   GRAMSTAIN No WBC Seen 04/18/2016   GRAMSTAIN No Squamous Epithelial Cells Seen 04/18/2016   GRAMSTAIN No Organisms Seen 04/18/2016   LABORGA Few GROUP B STREP (S.AGALACTIAE) ISOLATED 04/18/2016     Lab Results  Component Value Date   ALBUMIN 4.2 08/08/2019   ALBUMIN 4.4 06/10/2019   ALBUMIN 4.4 06/13/2018    Lab Results  Component Value Date   MG 2.3 11/12/2018   No results found for: VD25OH  No results found for: PREALBUMIN CBC EXTENDED Latest Ref Rng & Units 06/10/2019 11/01/2018 10/31/2018  WBC 4.0 - 10.5 K/uL 7.2 5.7 7.1  RBC 3.87 - 5.11 Mil/uL 4.76 4.14 4.29  HGB 12.0 - 15.0 g/dL 88.5 02.7 74.1  HCT 36 - 46 % 43.2 37.6 39.2  PLT 150 - 400 K/uL 294.0 244 269  NEUTROABS 1.4 - 7.7 K/uL 3.7 - -  LYMPHSABS 0.7 - 4.0 K/uL 2.7 - -     Body mass index is 30.42 kg/m.  Orders:  Orders Placed This Encounter  Procedures  . XR Cervical Spine 2  or 3 views  . XR Lumbar Spine 2-3 Views   No orders of the defined types were placed in this encounter.    Procedures: No procedures performed  Clinical Data: No additional findings.  ROS:  All other systems negative, except as noted in the HPI. Review of Systems  Objective: Vital Signs: Ht 5\' 1"  (1.549 m)   Wt 161 lb (73 kg)   BMI 30.42 kg/m   Specialty Comments:  No specialty comments available.  PMFS History: Patient Active Problem List   Diagnosis Date Noted  . Acute systolic heart failure (HCC) 11/01/2018  . NSTEMI (non-ST elevated myocardial infarction) (HCC) 10/30/2018  . Hyperlipidemia 06/13/2018  . Lactose intolerance 06/13/2018  . 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 Unilateral  primary osteoarthritis, right knee   . Special screening for malignant neoplasms, colon 12/29/2014  . Diarrhea 12/29/2014  . Medication management 05/13/2013  . Does not have health insurance 05/13/2013  . Adjustment reaction with anxiety and depression 03/12/2012  . Medication monitoring encounter 03/12/2012  . Hemorrhoids 08/13/2011  . Perineal itching, female 08/13/2011  . LBBB (left bundle branch block) 09/24/2010  . Murmur 09/08/2010  . EKG, abnormal 08/19/2010  . Visit for preventive health examination 08/19/2010  . Palpitations 08/18/2010  . Dizziness 08/18/2010  . Arthritis 08/18/2010  . Anxiety state 06/18/2008  . GRIEF REACTION 06/18/2008  . DEPRESSION, MAJOR, RECURRENT 07/06/2006  . ARTHRALGIA, UNSPECIFIED 07/06/2006   Past Medical History:  Diagnosis Date  . Anxiety   . Arthritis    R knee, back, hands   . Cellulitis of knee 04/2016   RT KNEE  . Cellulitis of right knee 04/18/2016  . Childhood asthma   . Complication of anesthesia    woke up slowly - 60yrs. ago  . Depression    related to husband illness and death  . History of jaundice    as a child    . Hx of varicella   . Hyperthyroidism    during pregnancy, treated & resolved post partum   . IBS (irritable bowel syndrome)   . LBBB (left bundle branch block)   . Scoliosis   . Scoliosis 07/2015  . Thyroid disease in pregnancy   . TOBACCO DEPENDENCE 07/06/2006   Qualifier: Diagnosis of  By: 07/08/2006    . Troponin level elevated     Family History  Problem Relation Age of Onset  . Alcohol abuse Mother        died from stomach ulcers  . Stroke Father        died  from cva and mi  . Hypertension Father   . Hyperlipidemia Father   . Coronary artery disease Father        Age 44  . Colon polyps Sister   . Hepatitis C Brother   . Colon cancer Neg Hx     Past Surgical History:  Procedure Laterality Date  . BREAST BIOPSY Right 1979   benign   . Cyst removed from ovary    . KNEE SURGERY      right [facility]   . LEFT HEART CATH AND CORONARY ANGIOGRAPHY N/A 10/31/2018   Procedure: LEFT HEART CATH AND CORONARY ANGIOGRAPHY;  Surgeon: 11/02/2018, MD;  Location: [facility] INVASIVE CV LAB;  Service: Cardiovascular;  Laterality: N/A;  . TONSILLECTOMY AND ADENOIDECTOMY  1073  . TOTAL KNEE ARTHROPLASTY 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   Procedure: TOTAL KNEE ARTHROPLASTY;  Surgeon: 13/12/2015, MD;  Location: [facility] OR;  Service: Orthopedics;  Laterality: Right;  . TUBAL LIGATION    . WISDOM TOOTH EXTRACTION     Social History   Occupational History  . Occupation: self    Comment: Geneticist, molecular  Tobacco Use  . Smoking status: Former Smoker    Packs/day: 0.25    Years: 40.00    Pack years: 10.00    Types: Cigarettes    Quit date: 01/06/2016    Years since quitting: 4.1  . Smokeless tobacco: Never Used  . Tobacco comment: none in about 1 week  Substance and Sexual Activity  . Alcohol use: Yes    Alcohol/week: 0.0 standard drinks    Comment: socially-wine- daily  . Drug use: No  . Sexual activity: Not on file

## 2020-04-11 IMAGING — MG DIGITAL SCREENING BILAT W/ TOMO W/ CAD
8 series · 8 of 24 positions shown · non-contrast
Comparison: Previous exam(s).

ACR Breast Density Category a: The breast tissue is almost entirely
fatty.

CLINICAL DATA: Screening.

EXAM:
DIGITAL SCREENING BILATERAL MAMMOGRAM WITH TOMO AND CAD

[R CC synth-2D]
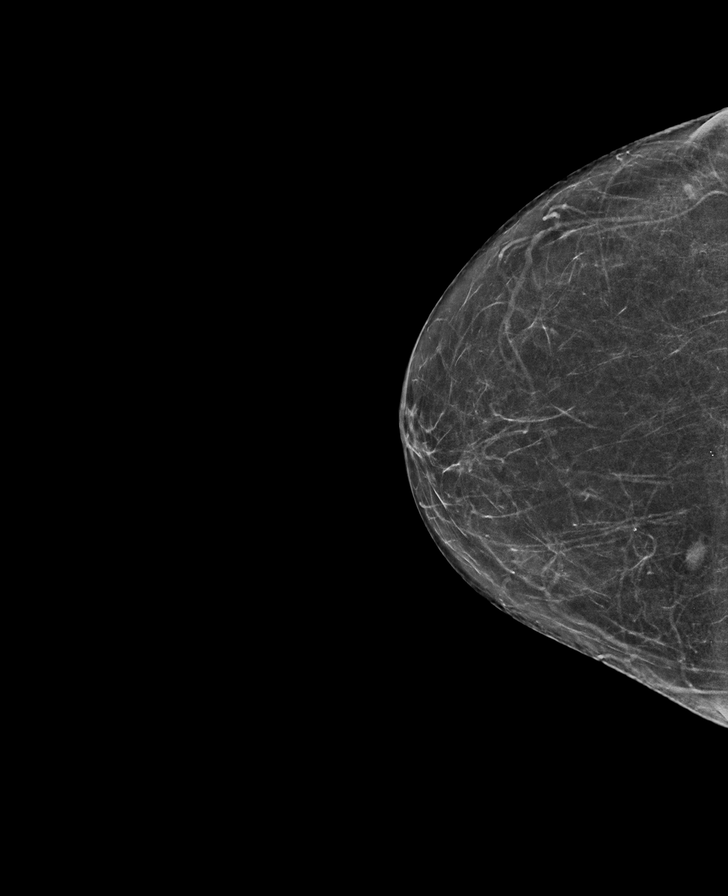

[L MLO synth-2D]
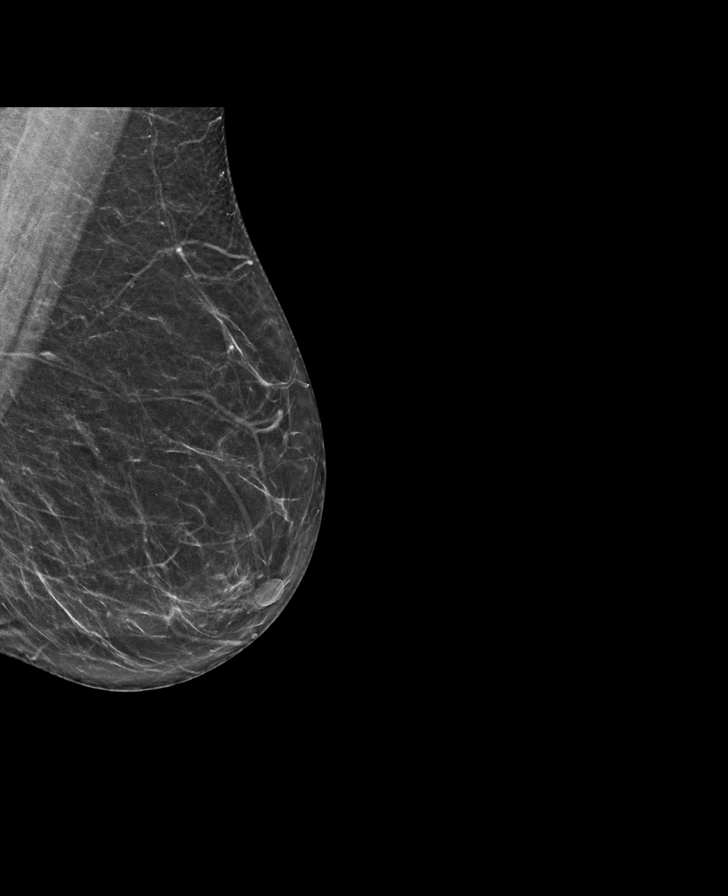

[L CC synth-2D]
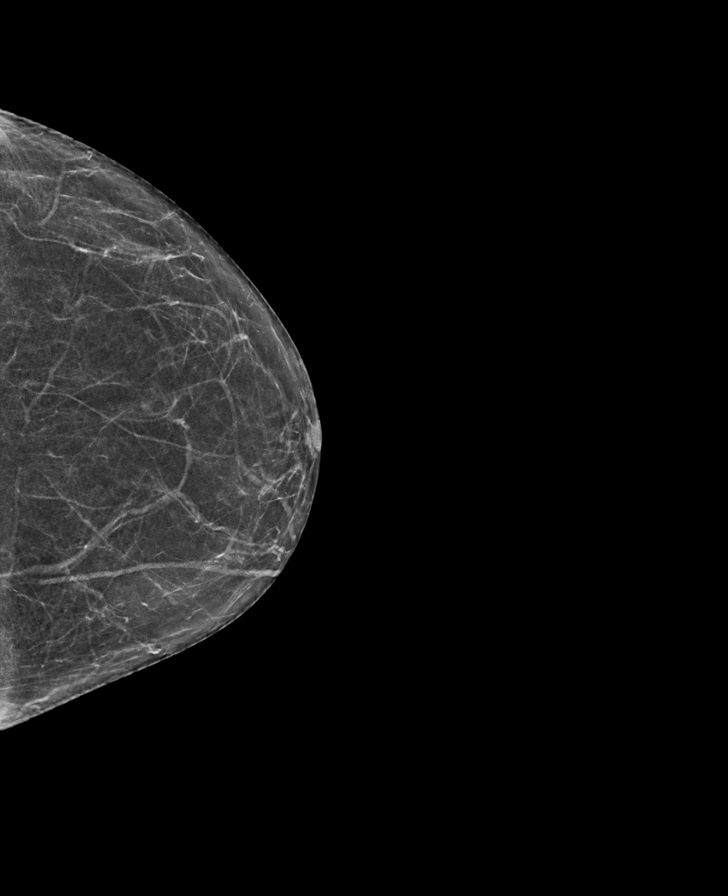

[R MLO synth-2D]
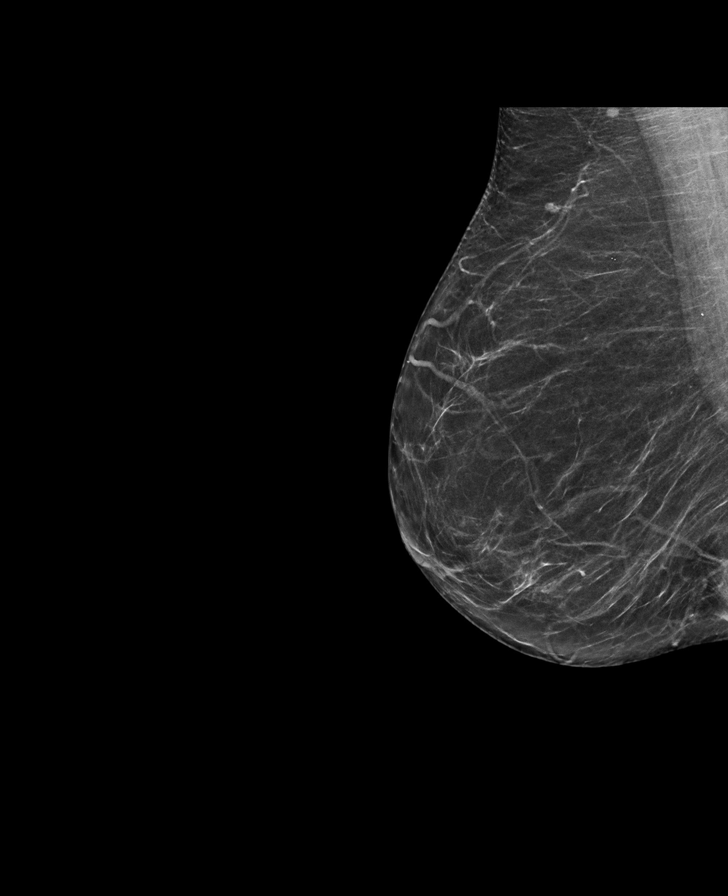

[L CC tomo · tomo slice 31/60.0]
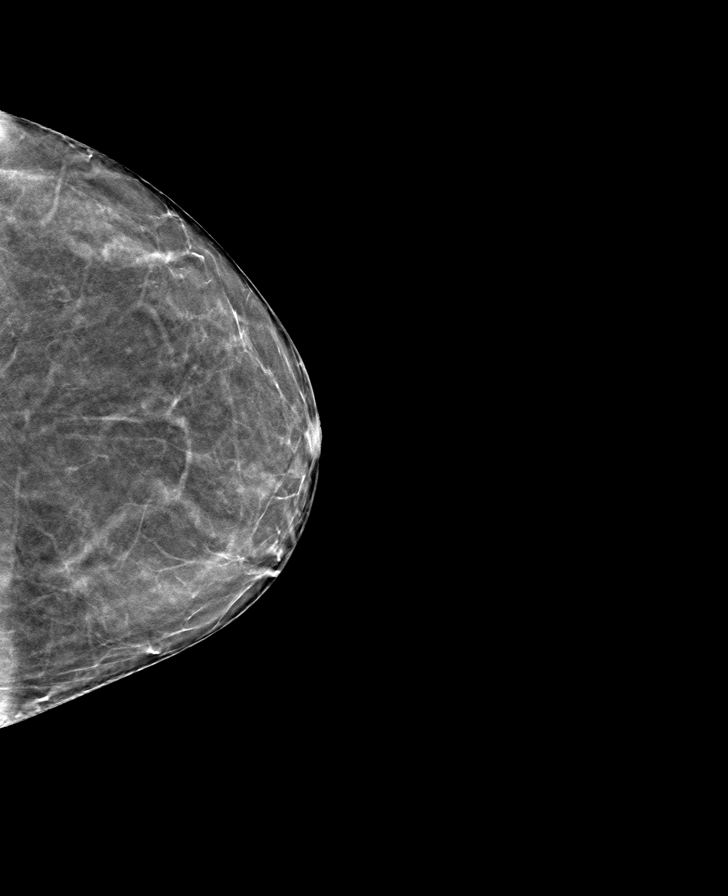

[L MLO tomo · tomo slice 33/64.0]
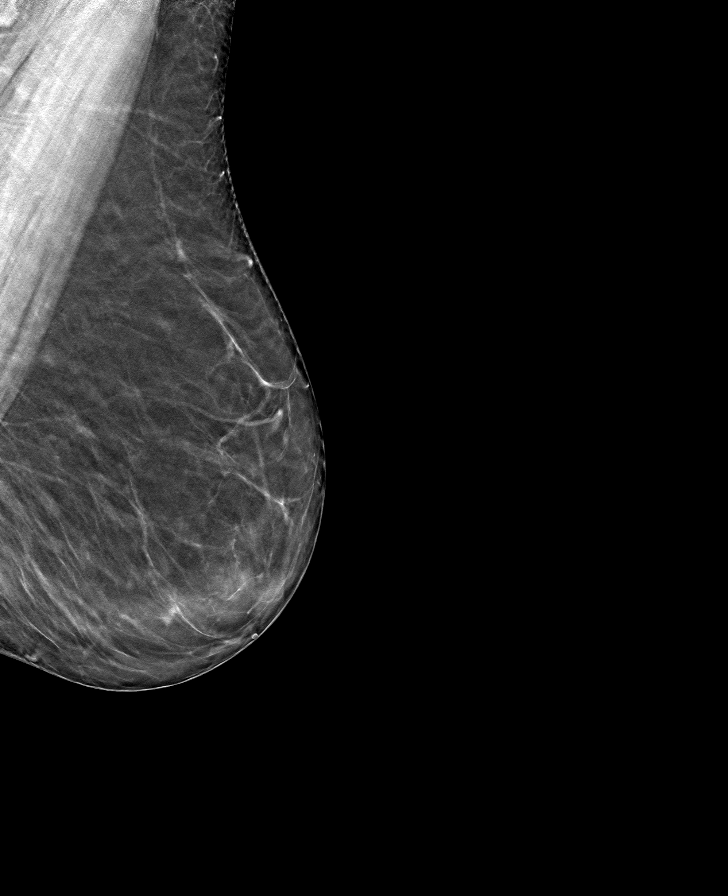

[R MLO tomo · tomo slice 34/67.0]
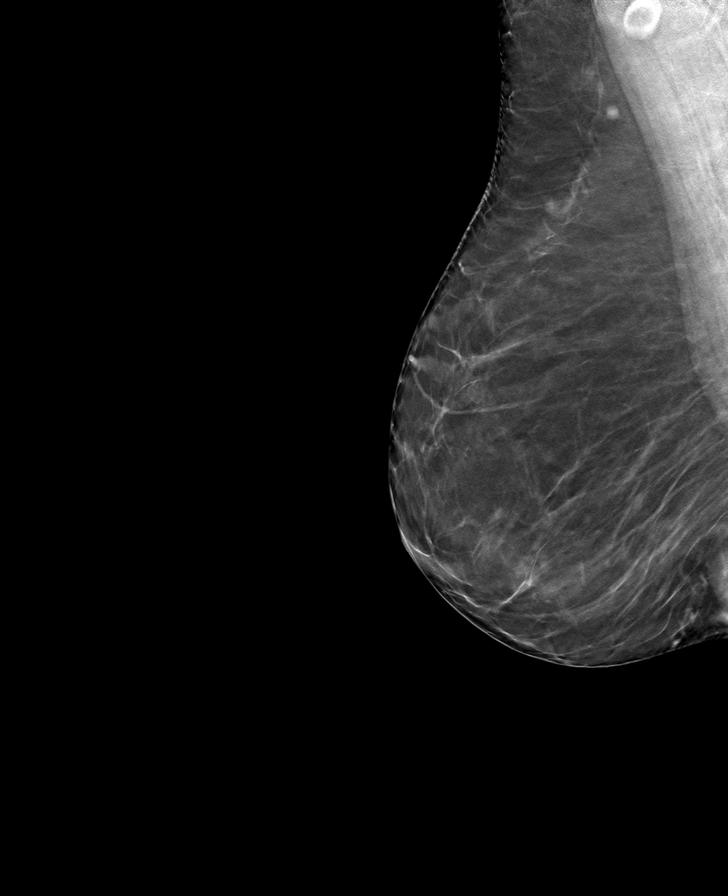

[R CC tomo · tomo slice 33/64.0]
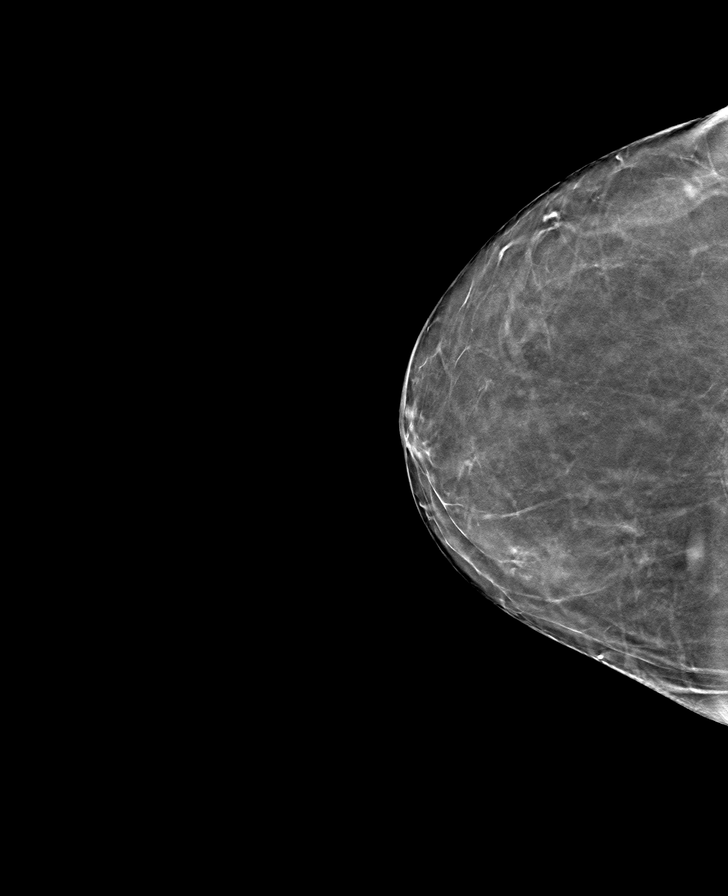

[8 of 24 positions shown; findings below may reference images not displayed]

FINDINGS: There are no findings suspicious for malignancy. Images were
processed with CAD.
IMPRESSION: No mammographic evidence of malignancy. A result letter of this
screening mammogram will be mailed directly to the patient.

RECOMMENDATION:
Screening mammogram in one year. (Code:8Y-[id number])

BI-RADS CATEGORY  1: Negative.

## 2020-04-13 ENCOUNTER — Ambulatory Visit (INDEPENDENT_AMBULATORY_CARE_PROVIDER_SITE_OTHER): Payer: 59 | Admitting: Physician Assistant

## 2020-04-13 ENCOUNTER — Ambulatory Visit: Payer: 59 | Admitting: Orthopedic Surgery

## 2020-04-13 ENCOUNTER — Encounter: Payer: Self-pay | Admitting: Orthopedic Surgery

## 2020-04-13 VITALS — Ht 61.0 in | Wt 161.0 lb

## 2020-04-13 DIAGNOSIS — M545 Low back pain, unspecified: Secondary | ICD-10-CM

## 2020-04-13 DIAGNOSIS — G8929 Other chronic pain: Secondary | ICD-10-CM | POA: Diagnosis not present

## 2020-04-13 NOTE — Progress Notes (Signed)
Office Visit Note   Patient: Valerie Love           Date of Birth: 1956-03-18           MRN: [id number] Visit Date: 04/13/2020              Requested by: Madelin Headings, MD [address] [place],  [place] [address] PCP: Madelin Headings, MD  Chief Complaint  Patient presents with  . Lower Back - Pain  . Neck - Pain      HPI: Is an 64 year old woman who follows up for her neck and lower back pain. She does feel the prednisone helped. She is not interested in any surgical or shots at this time but is wondering what else she can do. She is also using CBD oil which she finds helpful  Assessment & Plan: Visit Diagnoses: No diagnosis found.  Plan: I told the first patient not to take any Aleve if she still taking the steroid. I would give her 1 refill of this if she needed it. She will go to physical therapy to learn a home exercise program as she is very motivated and goes to a gym on a regular basis follow-up in 1 month  Follow-Up Instructions: No follow-ups on file.   Ortho Exam  Patient is alert, oriented, no adenopathy, well-dressed, normal affect, normal respiratory effort. Lower back exam unchanged except she does seem to have left tenderness both in the neck and the back.  Imaging: No results found. No images are attached to the encounter.  Labs: Lab Results  Component Value Date   HGBA1C 5.6 06/10/2019   HGBA1C 5.6 06/13/2018   ESRSEDRATE 17 04/18/2016   ESRSEDRATE 9 08/18/2010   CRP 0.8 04/18/2016   GRAMSTAIN No WBC Seen 04/18/2016   GRAMSTAIN No Squamous Epithelial Cells Seen 04/18/2016   GRAMSTAIN No Organisms Seen 04/18/2016   LABORGA Few GROUP B STREP (S.AGALACTIAE) ISOLATED 04/18/2016     Lab Results  Component Value Date   ALBUMIN 4.2 08/08/2019   ALBUMIN 4.4 06/10/2019   ALBUMIN 4.4 06/13/2018    Lab Results  Component Value Date   MG 2.3 11/12/2018   No results found for: VD25OH  No results found for: PREALBUMIN CBC EXTENDED  Latest Ref Rng & Units 06/10/2019 11/01/2018 10/31/2018  WBC 4.0 - 10.5 K/uL 7.2 5.7 7.1  RBC 3.87 - 5.11 Mil/uL 4.76 4.14 4.29  HGB 12.0 - 15.0 g/dL 19.1 47.8 29.5  HCT 36 - 46 % 43.2 37.6 39.2  PLT 150 - 400 K/uL 294.0 244 269  NEUTROABS 1.4 - 7.7 K/uL 3.7 - -  LYMPHSABS 0.7 - 4.0 K/uL 2.7 - -     Body mass index is 30.42 kg/m.  Orders:  No orders of the defined types were placed in this encounter.  No orders of the defined types were placed in this encounter.    Procedures: No procedures performed  Clinical Data: No additional findings.  ROS:  All other systems negative, except as noted in the HPI. Review of Systems  Objective: Vital Signs: Ht 5\' 1"  (1.549 m)   Wt 161 lb (73 kg)   BMI 30.42 kg/m   Specialty Comments:  No specialty comments available.  PMFS History: Patient Active Problem List   Diagnosis Date Noted  . Acute systolic heart failure (HCC) 11/01/2018  . NSTEMI (non-ST elevated myocardial infarction) (HCC) 10/30/2018  . Hyperlipidemia 06/13/2018  . Lactose intolerance 06/13/2018  . S/P total knee arthroplasty,  right 03/16/2016  . Unilateral primary osteoarthritis, right knee   . Special screening for malignant neoplasms, colon 12/29/2014  . Diarrhea 12/29/2014  . Medication management 05/13/2013  . Does not have health insurance 05/13/2013  . Adjustment reaction with anxiety and depression 03/12/2012  . Medication monitoring encounter 03/12/2012  . Hemorrhoids 08/13/2011  . Perineal itching, female 08/13/2011  . LBBB (left bundle branch block) 09/24/2010  . Murmur 09/08/2010  . EKG, abnormal 08/19/2010  . Visit for preventive health examination 08/19/2010  . Palpitations 08/18/2010  . Dizziness 08/18/2010  . Arthritis 08/18/2010  . Anxiety state 06/18/2008  . GRIEF REACTION 06/18/2008  . DEPRESSION, MAJOR, RECURRENT 07/06/2006  . ARTHRALGIA, UNSPECIFIED 07/06/2006   Past Medical History:  Diagnosis Date  . Anxiety   . Arthritis    R  knee, back, hands   . Cellulitis of knee 04/2016   RT KNEE  . Cellulitis of right knee 04/18/2016  . Childhood asthma   . Complication of anesthesia    woke up slowly - 75yrs. ago  . Depression    related to husband illness and death  . History of jaundice    as a child    . Hx of varicella   . Hyperthyroidism    during pregnancy, treated & resolved post partum   . IBS (irritable bowel syndrome)   . LBBB (left bundle branch block)   . Scoliosis   . Scoliosis 07/2015  . Thyroid disease in pregnancy   . TOBACCO DEPENDENCE 07/06/2006   Qualifier: Diagnosis of  By: Knox Royalty    . Troponin level elevated     Family History  Problem Relation Age of Onset  . Alcohol abuse Mother        died from stomach ulcers  . Stroke Father        died  from cva and mi  . Hypertension Father   . Hyperlipidemia Father   . Coronary artery disease Father        Age 6  . Colon polyps Sister   . Hepatitis C Brother   . Colon cancer Neg Hx     Past Surgical History:  Procedure Laterality Date  . BREAST BIOPSY Right 1979   benign   . Cyst removed from ovary    . KNEE SURGERY     right duda   . LEFT HEART CATH AND CORONARY ANGIOGRAPHY N/A 10/31/2018   Procedure: LEFT HEART CATH AND CORONARY ANGIOGRAPHY;  Surgeon: Lennette Bihari, MD;  [facility];  Service: Cardiovascular;  Laterality: N/A;  . TONSILLECTOMY AND ADENOIDECTOMY  1073  . TOTAL KNEE ARTHROPLASTY 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   Procedure: TOTAL KNEE ARTHROPLASTY;  Surgeon: [name], MD;  Location: [facility] OR;  Service: Orthopedics;  Laterality: Right;  . TUBAL LIGATION    . WISDOM TOOTH EXTRACTION     Social History   Occupational History  . Occupation: self    Comment: Geneticist, molecular  Tobacco Use  . Smoking status: Former Smoker    Packs/day: 0.25    Years: 40.00    Pack years: 10.00    Types: Cigarettes    Quit date: 01/06/2016    Years since quitting: 4.2  . Smokeless tobacco: Never Used  . Tobacco  comment: none in about 1 week  Substance and Sexual Activity  . Alcohol use: Yes    Alcohol/week: 0.0 standard drinks    Comment: socially-wine- daily  . Drug use: No  . Sexual activity: Not  on file

## 2020-04-15 ENCOUNTER — Encounter: Payer: Self-pay | Admitting: Physical Therapy

## 2020-04-15 ENCOUNTER — Other Ambulatory Visit: Payer: Self-pay

## 2020-04-15 ENCOUNTER — Ambulatory Visit: Payer: 59 | Admitting: Physical Therapy

## 2020-04-15 DIAGNOSIS — M542 Cervicalgia: Secondary | ICD-10-CM

## 2020-04-15 DIAGNOSIS — G8929 Other chronic pain: Secondary | ICD-10-CM

## 2020-04-15 DIAGNOSIS — M6281 Muscle weakness (generalized): Secondary | ICD-10-CM

## 2020-04-15 DIAGNOSIS — M545 Low back pain, unspecified: Secondary | ICD-10-CM

## 2020-04-15 NOTE — Therapy (Signed)
[facility] [address] [place], [place], [address] Phone: [phone]   Fax:  [phone]  Physical Therapy Evaluation  Patient Details  Name: Valerie Love MRN: [id number] Date of Birth: [birth year] Referring Provider (PT): Persons, [place], [place]   Encounter Date: 04/15/2020   PT End of Session - 04/15/20 1410    Visit Number 1    Number of Visits 3    Date for PT Re-Evaluation 05/27/20    PT Start Time 1300    PT Stop Time 1350    PT Time Calculation (min) 50 min    Activity Tolerance Patient tolerated treatment well    Behavior During Therapy [facility] for tasks assessed/performed           Past Medical History:  Diagnosis Date  . Anxiety   . Arthritis    R knee, back, hands   . Cellulitis of knee 04/2016   RT KNEE  . Cellulitis of right knee 04/18/2016  . Childhood asthma   . Complication of anesthesia    woke up slowly - 45yrs. ago  . Depression    related to husband illness and death  . History of jaundice    as a child    . Hx of varicella   . Hyperthyroidism    during pregnancy, treated & resolved post partum   . IBS (irritable bowel syndrome)   . LBBB (left bundle branch block)   . Scoliosis   . Scoliosis 07/2015  . Thyroid disease in pregnancy   . TOBACCO DEPENDENCE 07/06/2006   Qualifier: Diagnosis of  By: Knox Royalty    . Troponin level elevated     Past Surgical History:  Procedure Laterality Date  . BREAST BIOPSY Right 1979   benign   . Cyst removed from ovary    . KNEE SURGERY     right duda   . LEFT HEART CATH AND CORONARY ANGIOGRAPHY N/A 10/31/2018   Procedure: LEFT HEART CATH AND CORONARY ANGIOGRAPHY;  Surgeon: Lennette Bihari, MD;  [facility];  Service: Cardiovascular;  Laterality: N/A;  . TONSILLECTOMY AND ADENOIDECTOMY  1073  . TOTAL KNEE ARTHROPLASTY 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   Procedure: TOTAL KNEE ARTHROPLASTY;  Surgeon: [name], MD;  Location: [facility] OR;  Service: Orthopedics;   Laterality: Right;  . TUBAL LIGATION    . WISDOM TOOTH EXTRACTION      There were no vitals filed for this visit.    Subjective Assessment - 04/15/20 1302    Subjective She has had chronic LBP and neck for years, she wants to come 1-2 visits and learn what exercises and stretches she can do to manage this on her own. 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    Pertinent History PMH: Scoliosis, Rt TKA    Limitations Standing;Walking;House hold activities    How long can you stand comfortably? 2 hours    Diagnostic tests Lumbar XR "degenerative scoliosis with essentially complete collapse of the disc space throughout the entire lumbar spine without compression fractures with periarticular bony spurs"    Patient Stated Goals learn what exercises to do to manage this at home    Currently in Pain? Yes    Pain Score 5    neck and back   Pain Descriptors / Indicators --   depends can be sharp or dull   Pain Type Chronic pain    Pain Radiating Towards denies N/T in arms or legs  Pain Onset More than a month ago    Pain Frequency Intermittent    Aggravating Factors  bending over, stooping    Pain Relieving Factors rest, heat, steroids              [facility] PT Assessment - 04/15/20 0001      Assessment   Medical Diagnosis Chronic back/neck pain    Referring Provider (PT) Persons, [place], PA4    Onset Date/Surgical Date --   chronic pain but worse for last 90 days   Next MD Visit 05/11/20      Precautions   Precautions None      Restrictions   Weight Bearing Restrictions No      Balance Screen   Has the patient fallen in the past 6 months No    Has the patient had a decrease in activity level because of a fear of falling?  No    Is the patient reluctant to leave their home because of a fear of falling?  No      Home Tourist information centre manager residence      Prior Function   Level of Independence Independent    Vocation Full  time employment    Vocation Requirements cleans houses    Leisure goes to gym      Cognition   Overall Cognitive Status Within Functional Limits for tasks assessed      Observation/Other Assessments   Focus on Therapeutic Outcomes (FOTO)  69%      Posture/Postural Control   Posture Comments Scoliosis, slumped posture      ROM / Strength   AROM / PROM / Strength AROM;Strength      AROM   AROM Assessment Site Cervical;Lumbar    Cervical Flexion 75%    Cervical Extension WNL    Cervical - Right Side Bend 75%    Cervical - Left Side Bend 50%    Cervical - Right Rotation 75%    Cervical - Left Rotation 50%    Lumbar Flexion 90%    Lumbar Extension 50%    Lumbar - Right Side Bend 50%    Lumbar - Left Side Bend 50%    Lumbar - Right Rotation 50%    Lumbar - Left Rotation 50%      Strength   Overall Strength Comments UE/LE strength WFL, decreased core strength      Flexibility   Soft Tissue Assessment /Muscle Length --   tight upper traps, paraspinals     Palpation   Palpation comment TTP Lt upper trap, L4-5      Special Tests   Other special tests neg spurlings test, negative SLR test      Transfers   Transfers Independent with all Transfers                      Objective measurements completed on examination: See above findings.       [facility] Adult PT Treatment/Exercise - 04/15/20 0001      Modalities   Modalities Moist Heat      Moist Heat Therapy   Number Minutes Moist Heat 10 Minutes    Moist Heat Location Lumbar Spine;Cervical                  PT Education - 04/15/20 1409    Education Details HEP, POC    Person(s) Educated Patient    Methods Explanation;Demonstration;Verbal cues;Handout    Comprehension Verbalized understanding;Returned demonstration;Need further instruction  PT Short Term Goals - 04/15/20 1416      PT SHORT TERM GOAL #1   Title be independent in initial HEP    Time 4    Period Weeks    Status  New             PT Long Term Goals - 04/15/20 1417      PT LONG TERM GOAL #1   Title reduce overall pain to less than 3-4 with usual activity and work    Time 6    Period Weeks    Status New    Target Date 05/27/20      PT LONG TERM GOAL #2   Title improve FOTO to 70% functional    Time 6    Period Weeks    Status New      PT LONG TERM GOAL #3   Title improve lumbar/cervical AROM to [facility].    Time 6    Period Weeks    Status New                  Plan - 04/15/20 1409    Clinical Impression Statement Pt presents with Chronic back and neck pain without radiculopathy. She appears to have direction preference for extension. She will benefit from 2-3 PT sessions to address her deficits in ROM, strength, mobiliity, activity tolerance, and then tansition to home maintenance program.    Examination-Activity Limitations Bend;Squat;Stairs;Stand;Lift;Locomotion Level;Reach Overhead    Examination-Participation Restrictions Cleaning;Community Activity    Stability/Clinical Decision Making Evolving/Moderate complexity    Clinical Decision Making Moderate    Rehab Potential Good    PT Frequency Biweekly   2-3 visits over 8 weeks   PT Treatment/Interventions ADLs/Self Care Home Management;Cryotherapy;Electrical Stimulation;Iontophoresis 4mg /ml Dexamethasone;Moist Heat;Traction;Ultrasound;Therapeutic exercise;Therapeutic activities;Neuromuscular re-education;Manual techniques;Passive range of motion;Dry needling;Joint Manipulations;Spinal Manipulations    PT Next Visit Plan review and update HEP PRN, go over gym execises paticulary row machine lat pull, cable exercises    PT Home Exercise Plan Access Code: [email or web address]           Patient will benefit from skilled therapeutic intervention in order to improve the following deficits and impairments:  Decreased activity tolerance, Decreased strength, Decreased range of motion, Difficulty walking, Hypomobility, Postural dysfunction,  Pain  Visit Diagnosis: Chronic bilateral low back pain without sciatica  Cervicalgia  Muscle weakness (generalized)     Problem List Patient Active Problem List   Diagnosis Date Noted  . Acute systolic heart failure (HCC) 11/01/2018  . NSTEMI (non-ST elevated myocardial infarction) (HCC) 10/30/2018  . Hyperlipidemia 06/13/2018  . Lactose intolerance 06/13/2018  . 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 Unilateral primary osteoarthritis, right knee   . Special screening for malignant neoplasms, colon 12/29/2014  . Diarrhea 12/29/2014  . Medication management 05/13/2013  . Does not have health insurance 05/13/2013  . Adjustment reaction with anxiety and depression 03/12/2012  . Medication monitoring encounter 03/12/2012  . Hemorrhoids 08/13/2011  . Perineal itching, female 08/13/2011  . LBBB (left bundle branch block) 09/24/2010  . Murmur 09/08/2010  . EKG, abnormal 08/19/2010  . Visit for preventive health examination 08/19/2010  . Palpitations 08/18/2010  . Dizziness 08/18/2010  . Arthritis 08/18/2010  . Anxiety state 06/18/2008  . GRIEF REACTION 06/18/2008  . DEPRESSION, MAJOR, RECURRENT 07/06/2006  . ARTHRALGIA, UNSPECIFIED 07/06/2006    Birdie Riddle 04/15/2020, 2:21 PM  [facility] [address] [place], [place], [address] Phone: [phone]   Fax:  [phone]  Name:  Jonell Brumbaugh MRN: [id number] Date of Birth: 29-May-1955

## 2020-04-15 NOTE — Patient Instructions (Signed)
Access Code: [email or web address] URL: [email or web address] Date: 04/15/2020 Prepared by: Ivery Quale  Exercises Sidelying Open Book Thoracic Lumbar Rotation and Extension - 1-2 x daily - 3-4 x weekly - 1 sets - 10 reps - 5 hold Supine Shoulder Flexion Extension Full Range AROM - 2 x daily - 3-4 x weekly - 1 sets - 10 reps - 5 hold Supine Bridge - 2 x daily - 3-4 x weekly - 1-2 sets - 10 reps - 5 hold Prone Press Up on Elbows - 2 x daily - 3-4 x weekly - 2-3 sets - 10 reps - 5 hold Bird Dog - 2 x daily - 3-4 x weekly - 1 sets - 10 reps - 5 sec hold Upper Trapezius Stretch - 1-2 x daily - 3-4 x weekly - 3 reps - 30 hold Right Standing Lateral Shift Correction at Wall - Hold - 2 x daily - 3-4 x weekly - 1 sets - 10 reps Standing Scapular Retraction with External Rotation - 2 x daily - 3-4 x weekly - 2-3 sets - 10 reps Seated Levator Scapulae Stretch - 2 x daily - 3-4 x weekly - 1 sets - 2-3 reps - 30 hold

## 2020-05-05 ENCOUNTER — Other Ambulatory Visit: Payer: Self-pay

## 2020-05-05 ENCOUNTER — Encounter: Payer: Self-pay | Admitting: Physical Therapy

## 2020-05-05 ENCOUNTER — Ambulatory Visit: Payer: 59 | Admitting: Physical Therapy

## 2020-05-05 DIAGNOSIS — M545 Low back pain, unspecified: Secondary | ICD-10-CM

## 2020-05-05 DIAGNOSIS — G8929 Other chronic pain: Secondary | ICD-10-CM

## 2020-05-05 DIAGNOSIS — M542 Cervicalgia: Secondary | ICD-10-CM | POA: Diagnosis not present

## 2020-05-05 DIAGNOSIS — M6281 Muscle weakness (generalized): Secondary | ICD-10-CM | POA: Diagnosis not present

## 2020-05-05 NOTE — Therapy (Addendum)
[facility] [address] [place], [place], [address] Phone: [phone]   Fax:  [phone]  Physical Therapy Treatment/Discharge addendum PHYSICAL THERAPY DISCHARGE SUMMARY  Visits from Start of Care: 2  Current functional level related to goals / functional outcomes: See below   Remaining deficits: See below   Education / Equipment: HEP  Plan: Patient agrees to discharge.  Patient goals were not met. Patient is being discharged due to not returning since the last visit.  ?????   Elsie Ra, PT, DPT 06/17/20 8:35 AM     Patient Details  Name: Valerie Love MRN: [id number] Date of Birth: [birth year] Referring Provider (PT): Persons, [place], [place]   Encounter Date: 05/05/2020   PT End of Session - 05/05/20 1611    Visit Number 2    Number of Visits 3    Date for PT Re-Evaluation 05/27/20    PT Start Time 0932    PT Stop Time 1603    PT Time Calculation (min) 48 min    Activity Tolerance Patient tolerated treatment well    Behavior During Therapy [facility] for tasks assessed/performed           Past Medical History:  Diagnosis Date  . Anxiety   . Arthritis    R knee, back, hands   . Cellulitis of knee 04/2016   RT KNEE  . Cellulitis of right knee 04/18/2016  . Childhood asthma   . Complication of anesthesia    woke up slowly - 6yrs. ago  . Depression    related to husband illness and death  . History of jaundice    as a child    . Hx of varicella   . Hyperthyroidism    during pregnancy, treated & resolved post partum   . IBS (irritable bowel syndrome)   . LBBB (left bundle branch block)   . Scoliosis   . Scoliosis 07/2015  . Thyroid disease in pregnancy   . TOBACCO DEPENDENCE 07/06/2006   Qualifier: Diagnosis of  By: Samara Snide    . Troponin level elevated     Past Surgical History:  Procedure Laterality Date  . BREAST BIOPSY Right 1979   benign   . Cyst removed from ovary    . KNEE SURGERY      right duda   . LEFT HEART CATH AND CORONARY ANGIOGRAPHY N/A 10/31/2018   Procedure: LEFT HEART CATH AND CORONARY ANGIOGRAPHY;  Surgeon: Troy Sine, MD;  Location: [place] CV LAB;  Service: Cardiovascular;  Laterality: N/A;  . TONSILLECTOMY AND ADENOIDECTOMY  1073  . TOTAL KNEE ARTHROPLASTY 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   Procedure: TOTAL KNEE ARTHROPLASTY;  Surgeon: Newt Minion, MD;  Location: [place];  Service: Orthopedics;  Laterality: Right;  . TUBAL LIGATION    . WISDOM TOOTH EXTRACTION      There were no vitals filed for this visit.   Subjective Assessment - 05/05/20 1606    Subjective She relays overall the pain is about the same, biggest complaint is her neck pain today with rotation .    Pertinent History PMH: Scoliosis, Rt TKA    Limitations Standing;Walking;House hold activities    How long can you stand comfortably? 2 hours    Diagnostic tests Lumbar XR "degenerative scoliosis with essentially complete collapse of the disc space throughout the entire lumbar spine without compression fractures with periarticular bony spurs"    Patient Stated Goals learn what exercises to do to manage this at home  Pain Onset More than a month ago                             [facility] Adult PT Treatment/Exercise - 05/05/20 0001      Exercises   Exercises Neck;Lumbar      Neck Exercises: Machines for Strengthening   Nustep L5 X 6 min UE/LE      Neck Exercises: Seated   Cervical Rotation 10 reps    Cervical Rotation Limitations MWM 10 reps holding 5 sec      Neck Exercises: Supine   Other Supine Exercise Lt flexion stretch holding water bottle 5 sec  X10 reps      Lumbar Exercises: Standing   Other Standing Lumbar Exercises lateral shift correction moving hips to right 5 sec X 10 reps      Manual Therapy   Manual therapy comments manual cervical traction, lumbar traction, C-T spine central PA mobs grade 2-3, sidelying thoracc rotation stretch      Neck Exercises: Stretches    Upper Trapezius Stretch 1 rep;30 seconds    Levator Stretch 1 rep;30 seconds                    PT Short Term Goals - 04/15/20 1416      PT SHORT TERM GOAL #1   Title be independent in initial HEP    Time 4    Period Weeks    Status New             PT Long Term Goals - 04/15/20 1417      PT LONG TERM GOAL #1   Title reduce overall pain to less than 3-4 with usual activity and work    Time 6    Period Weeks    Status New    Target Date 05/27/20      PT LONG TERM GOAL #2   Title improve FOTO to 70% functional    Time 6    Period Weeks    Status New      PT LONG TERM GOAL #3   Title improve lumbar/cervical AROM to [facility].    Time 6    Period Weeks    Status New                 Plan - 05/05/20 1712    Clinical Impression Statement Modified her exercises some today. She is still having chronic pain and was treated with more manual therapy today. She will follow up with MD next week about injections and then will return to PT PRN.    Examination-Activity Limitations Bend;Squat;Stairs;Stand;Lift;Locomotion Level;Reach Overhead    Examination-Participation Restrictions Cleaning;Community Activity    Stability/Clinical Decision Making Evolving/Moderate complexity    Rehab Potential Good    PT Frequency Biweekly   2-3 visits over 8 weeks   PT Treatment/Interventions ADLs/Self Care Home Management;Cryotherapy;Electrical Stimulation;Iontophoresis 4mg /ml Dexamethasone;Moist Heat;Traction;Ultrasound;Therapeutic exercise;Therapeutic activities;Neuromuscular re-education;Manual techniques;Passive range of motion;Dry needling;Joint Manipulations;Spinal Manipulations    PT Next Visit Plan review and update HEP PRN, go over gym execises paticulary row machine lat pull, cable exercises    PT Home Exercise Plan Access Code: [place]           Patient will benefit from skilled therapeutic intervention in order to improve the following deficits and impairments:   Decreased activity tolerance,Decreased strength,Decreased range of motion,Difficulty walking,Hypomobility,Postural dysfunction,Pain  Visit Diagnosis: Chronic bilateral low back pain without sciatica  Cervicalgia  Muscle weakness (  generalized)     Problem List Patient Active Problem List   Diagnosis Date Noted  . Acute systolic heart failure ([place]) 11/01/2018  . NSTEMI (non-ST elevated myocardial infarction) ([place]) 10/30/2018  . Hyperlipidemia 06/13/2018  . Lactose intolerance 06/13/2018  . 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 Unilateral primary osteoarthritis, right knee   . Special screening for malignant neoplasms, colon 12/29/2014  . Diarrhea 12/29/2014  . Medication management 05/13/2013  . Does not have health insurance 05/13/2013  . Adjustment reaction with anxiety and depression 03/12/2012  . Medication monitoring encounter 03/12/2012  . Hemorrhoids 08/13/2011  . Perineal itching, female 08/13/2011  . LBBB (left bundle branch block) 09/24/2010  . Murmur 09/08/2010  . EKG, abnormal 08/19/2010  . Visit for preventive health examination 08/19/2010  . Palpitations 08/18/2010  . Dizziness 08/18/2010  . Arthritis 08/18/2010  . Anxiety state 06/18/2008  . GRIEF REACTION 06/18/2008  . DEPRESSION, MAJOR, RECURRENT 07/06/2006  . ARTHRALGIA, UNSPECIFIED 07/06/2006    Silvestre Mesi 05/05/2020, 5:14 PM  [facility] Therapy [address] [place], [place], [address] Phone: [phone]   Fax:  [phone]  Name: Valerie Love MRN: [id number] Date of Birth: 10-Mar-1956

## 2020-05-09 ENCOUNTER — Other Ambulatory Visit: Payer: Self-pay | Admitting: Internal Medicine

## 2020-05-11 ENCOUNTER — Ambulatory Visit: Payer: 59 | Admitting: Physician Assistant

## 2020-05-11 NOTE — Telephone Encounter (Signed)
Refill request for:  Alprazolam 0.5 mg  LR 07/01/19, #30, 0 rf LOV 06/10/19 FOV  None scheduled.    Please review and advise.  Thanks.  Dm/cma

## 2020-05-14 NOTE — Telephone Encounter (Signed)
Sent in electronically .  Arrange  Yearly appt    Needed before further refills

## 2020-05-15 ENCOUNTER — Ambulatory Visit (INDEPENDENT_AMBULATORY_CARE_PROVIDER_SITE_OTHER): Payer: 59 | Admitting: Physician Assistant

## 2020-05-15 ENCOUNTER — Encounter: Payer: Self-pay | Admitting: Physician Assistant

## 2020-05-15 DIAGNOSIS — M542 Cervicalgia: Secondary | ICD-10-CM | POA: Diagnosis not present

## 2020-05-15 DIAGNOSIS — G8929 Other chronic pain: Secondary | ICD-10-CM

## 2020-05-15 MED ORDER — PREDNISONE 10 MG PO TABS
20.0000 mg | ORAL_TABLET | Freq: Every day | ORAL | 0 refills | Status: DC
Start: 2020-05-15 — End: 2020-08-26

## 2020-05-15 NOTE — Progress Notes (Signed)
Office Visit Note   Patient: Valerie Love           Date of Birth: Jul 27, 1955           MRN: [id number] Visit Date: 05/15/2020              Requested by: Burnis Medin, MD [place],  [place] [id number] PCP: Burnis Medin, MD  No chief complaint on file.     HPI: Patient is a pleasant 65 year old woman who follows up today after doing physical therapy for her neck and her lower back.  She has had a several year history of lower back pain but a more recent history of 3 months of neck pain.  She does feel like some of the exercises they have shown her how to do have helped but have not significantly taken away her pain.  She did have improvement with prednisone.  Right now she takes 2 Aleve and gets little to no relief  Assessment & Plan: Visit Diagnoses:  1. Neck pain, chronic     Plan: I explained to the patient the next step would be to obtain an MRI of her cervical spine and her lumbar spine.  Then she could be referred for epidural steroid injections.  She would like to try 1 more round of oral steroids and see how she does with her home exercise program.  If she does call us we would go forward with ordering MRIs of her cervical and lumbar spine.  Follow-Up Instructions: Return if symptoms worsen or fail to improve.   Ortho Exam  Patient is alert, oriented, no adenopathy, well-dressed, normal affect, normal respiratory effort. Examination of her neck she has no palpable deformity she is able to turn her her neck to the right without difficulty turning to the left and extending her neck causes her pain.  She has no radicular symptoms nothing radiates down into her hands and she has good strength.  Her lower back tenderness over the lumbar spine.  Occasionally some radiation into the buttocks but no further.  She has good plantar flexion dorsiflexion extension and flexion strength.  No paresthesias  Imaging: No results found. No images are attached  to the encounter.  Labs: Lab Results  Component Value Date   HGBA1C 5.6 06/10/2019   HGBA1C 5.6 06/13/2018   ESRSEDRATE 17 04/18/2016   ESRSEDRATE 9 08/18/2010   CRP 0.8 04/18/2016   GRAMSTAIN No WBC Seen 04/18/2016   GRAMSTAIN No Squamous Epithelial Cells Seen 04/18/2016   GRAMSTAIN No Organisms Seen 04/18/2016   LABORGA Few GROUP B STREP (S.AGALACTIAE) ISOLATED 04/18/2016     Lab Results  Component Value Date   ALBUMIN 4.2 08/08/2019   ALBUMIN 4.4 06/10/2019   ALBUMIN 4.4 06/13/2018    Lab Results  Component Value Date   MG 2.3 11/12/2018   No results found for: VD25OH  No results found for: PREALBUMIN CBC EXTENDED Latest Ref Rng & Units 06/10/2019 11/01/2018 10/31/2018  WBC 4.0 - 10.5 K/uL 7.2 5.7 7.1  RBC 3.87 - 5.11 Mil/uL 4.76 4.14 4.29  HGB 12.0 - 15.0 g/dL 14.4 12.6 13.0  HCT 36.0 - 46.0 % 43.2 37.6 39.2  PLT 150.0 - 400.0 K/uL 294.0 244 269  NEUTROABS 1.4 - 7.7 K/uL 3.7 - -  LYMPHSABS 0.7 - 4.0 K/uL 2.7 - -     There is no height or weight on file to calculate BMI.  Orders:  No orders of the defined types  were placed in this encounter.  Meds ordered this encounter  Medications  . predniSONE (DELTASONE) 10 MG tablet    Sig: Take 2 tablets (20 mg total) by mouth daily with breakfast.    Dispense:  60 tablet    Refill:  0     Procedures: No procedures performed  Clinical Data: No additional findings.  ROS:  All other systems negative, except as noted in the HPI. Review of Systems  Objective: Vital Signs: There were no vitals taken for this visit.  Specialty Comments:  No specialty comments available.  PMFS History: Patient Active Problem List   Diagnosis Date Noted  . Acute systolic heart failure (HCC) 11/01/2018  . NSTEMI (non-ST elevated myocardial infarction) (HCC) 10/30/2018  . Hyperlipidemia 06/13/2018  . Lactose intolerance 06/13/2018  . 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 Unilateral primary osteoarthritis, right  knee   . Special screening for malignant neoplasms, colon 12/29/2014  . Diarrhea 12/29/2014  . Medication management 05/13/2013  . Does not have health insurance 05/13/2013  . Adjustment reaction with anxiety and depression 03/12/2012  . Medication monitoring encounter 03/12/2012  . Hemorrhoids 08/13/2011  . Perineal itching, female 08/13/2011  . LBBB (left bundle branch block) 09/24/2010  . Murmur 09/08/2010  . EKG, abnormal 08/19/2010  . Visit for preventive health examination 08/19/2010  . Palpitations 08/18/2010  . Dizziness 08/18/2010  . Arthritis 08/18/2010  . Anxiety state 06/18/2008  . GRIEF REACTION 06/18/2008  . DEPRESSION, MAJOR, RECURRENT 07/06/2006  . ARTHRALGIA, UNSPECIFIED 07/06/2006   Past Medical History:  Diagnosis Date  . Anxiety   . Arthritis    R knee, back, hands   . Cellulitis of knee 04/2016   RT KNEE  . Cellulitis of right knee 04/18/2016  . Childhood asthma   . Complication of anesthesia    woke up slowly - 27yrs. ago  . Depression    related to husband illness and death  . History of jaundice    as a child    . Hx of varicella   . Hyperthyroidism    during pregnancy, treated & resolved post partum   . IBS (irritable bowel syndrome)   . LBBB (left bundle branch block)   . Scoliosis   . Scoliosis 07/2015  . Thyroid disease in pregnancy   . TOBACCO DEPENDENCE 07/06/2006   Qualifier: Diagnosis of  By: Knox Royalty    . Troponin level elevated     Family History  Problem Relation Age of Onset  . Alcohol abuse Mother        died from stomach ulcers  . Stroke Father        died  from cva and mi  . Hypertension Father   . Hyperlipidemia Father   . Coronary artery disease Father        Age 35  . Colon polyps Sister   . Hepatitis C Brother   . Colon cancer Neg Hx     Past Surgical History:  Procedure Laterality Date  . BREAST BIOPSY Right 1979   benign   . Cyst removed from ovary    . KNEE SURGERY     right duda   . LEFT HEART CATH  AND CORONARY ANGIOGRAPHY N/A 10/31/2018   Procedure: LEFT HEART CATH AND CORONARY ANGIOGRAPHY;  Surgeon: Lennette Bihari, MD;  Location: [facility];  Service: Cardiovascular;  Laterality: N/A;  . TONSILLECTOMY AND ADENOIDECTOMY  1073  . TOTAL KNEE ARTHROPLASTY 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   Procedure: TOTAL  KNEE ARTHROPLASTY;  Surgeon: [name], MD;  [facility];  Service: Orthopedics;  Laterality: Right;  . TUBAL LIGATION    . WISDOM TOOTH EXTRACTION     Social History   Occupational History  . Occupation: self    Comment: Geneticist, molecular  Tobacco Use  . Smoking status: Former Smoker    Packs/day: 0.25    Years: 40.00    Pack years: 10.00    Types: Cigarettes    Quit date: 01/06/2016    Years since quitting: 4.3  . Smokeless tobacco: Never Used  . Tobacco comment: none in about 1 week  Substance and Sexual Activity  . Alcohol use: Yes    Alcohol/week: 0.0 standard drinks    Comment: socially-wine- daily  . Drug use: No  . Sexual activity: Not on file

## 2020-05-28 ENCOUNTER — Other Ambulatory Visit: Payer: Self-pay

## 2020-05-28 DIAGNOSIS — E7800 Pure hypercholesterolemia, unspecified: Secondary | ICD-10-CM

## 2020-05-28 MED ORDER — ROSUVASTATIN CALCIUM 5 MG PO TABS: 5.0000 mg | ORAL_TABLET | Freq: Every day | ORAL | 1 refills | Status: DC

## 2020-08-10 ENCOUNTER — Other Ambulatory Visit: Payer: Self-pay | Admitting: Internal Medicine

## 2020-08-10 DIAGNOSIS — Z1231 Encounter for screening mammogram for malignant neoplasm of breast: Secondary | ICD-10-CM

## 2020-08-25 ENCOUNTER — Other Ambulatory Visit: Payer: Self-pay | Admitting: Internal Medicine

## 2020-08-25 NOTE — Progress Notes (Signed)
Chief Complaint  Patient presents with  . Annual Exam    HPI: Patient  Valerie Love  65 y.o. comes in today for Preventive Health Care visit  And med management   Alprazolam rare use and continued citalopram has been helpful but still gets some anxiety ever since the death of her husband.  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  No weakness. CV: History ofnstem  cards b blockers    ocass irreg  Or palitations  On crestor  No hx of high bp per se  Not checking at this time  Arthritis in back  Is most problematic at times   Health Maintenance  Topic Date Due  . PNA vac Low Risk Adult (1 of 2 - PCV13) 08/03/2020  . INFLUENZA VACCINE  12/07/2020  . PAP SMEAR-Modifier  12/27/2020  . MAMMOGRAM  07/21/2021  . COLONOSCOPY (Pts [phone] Insurance coverage will need to be confirmed)  01/05/2025  . TETANUS/TDAP  02/22/2029  . DEXA SCAN  Completed  . COVID-19 Vaccine  Completed  . Hepatitis C Screening  Completed  . HIV Screening  Completed  . HPV VACCINES  Aged Out   Health Maintenance Review LIFESTYLE:  Exercise:  gyme 3 d per week and 5 miles  active Tobacco/ETS: no Alcohol:    White wine   6 per week  Sugar beverages: Sleep: fine  Drug use: no HH of  2 + grand kids on weekends  Work: 15 hours per week  Takes Probiotic   ROS:  GEN/ HEENT: No fever, significant weight changes sweats headaches vision problems hearing changes, CV/ PULM; No chest pain shortness of breath cough, syncope,edema  change in exercise tolerance. GI /GU: No adominal pain, vomiting, change in bowel habits. No blood in the stool. No significant GU symptoms. SKIN/HEME: ,no acute skin rashes suspicious lesions or bleeding. No lymphadenopathy, nodules, masses.  NEURO/ PSYCH:  No neurologic signs such as weakness numbness. No depression anxiety. IMM/ Allergy: No unusual infections.  Allergy .   REST of 12 system  review negative except as per HPI   Past Medical History:  Diagnosis Date  . Anxiety   . Arthritis    R knee, back, hands   . Cellulitis of knee 04/2016   RT KNEE  . Cellulitis of right knee 04/18/2016  . Childhood asthma   . Complication of anesthesia    woke up slowly - 4538yrs. ago  . Depression    related to husband illness and death  . History of jaundice    as a child    . Hx of varicella   . Hyperthyroidism    during pregnancy, treated & resolved post partum   . IBS (irritable bowel syndrome)   . LBBB (left bundle branch block)   . Scoliosis   . Scoliosis 07/2015  . Thyroid disease in pregnancy   . TOBACCO DEPENDENCE 07/06/2006   Qualifier: Diagnosis of  By: Knox Royaltydell, Erin    . Troponin level elevated     Past Surgical History:  Procedure Laterality Date  . BREAST BIOPSY Right 1979   benign   . Cyst removed from ovary    . KNEE SURGERY     right duda   . LEFT HEART CATH AND CORONARY ANGIOGRAPHY N/A 10/31/2018   Procedure: LEFT HEART CATH AND CORONARY ANGIOGRAPHY;  Surgeon: Lennette BihariKelly, Thomas A, MD;  Location: [facility];  Service:  Cardiovascular;  Laterality: N/A;  . TONSILLECTOMY AND ADENOIDECTOMY  1073  . TOTAL KNEE ARTHROPLASTY 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   Procedure: TOTAL KNEE ARTHROPLASTY;  Surgeon: [name], MD;  Location: [facility] OR;  Service: Orthopedics;  Laterality: Right;  . TUBAL LIGATION    . WISDOM TOOTH EXTRACTION      Family History  Problem Relation Age of Onset  . Alcohol abuse Mother        died from stomach ulcers  . Stroke Father        died  from cva and mi  . Hypertension Father   . Hyperlipidemia Father   . Coronary artery disease Father        Age 34  . Colon polyps Sister   . Hepatitis C Brother   . Colon cancer Neg Hx     Social History   Socioeconomic History  . Marital status: Widowed    Spouse name: Not on file  . Number of children: 3  . Years of education: Not on file  . Highest education level: Not on file  Occupational  History  . Occupation: self    Comment: Geneticist, molecular  Tobacco Use  . Smoking status: Former Smoker    Packs/day: 0.25    Years: 40.00    Pack years: 10.00    Types: Cigarettes    Quit date: 01/06/2016    Years since quitting: 4.6  . Smokeless tobacco: Never Used  . Tobacco comment: none in about 1 week  Substance and Sexual Activity  . Alcohol use: Yes    Alcohol/week: 0.0 standard drinks    Comment: socially-wine- daily  . Drug use: No  . Sexual activity: Not on file  Other Topics Concern  . Not on file  Social History Narrative   Husband passed away on 02-18-08 hep c and cirrhosis.   She is hep c negative 2006   G3P3   HH of 2-3  son no pets    No falls .  Has smoke detector and wears seat belts. POsitive  firearms. No excess sun exposure.    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   Had time of nono insuranced and cannot affort 800 per month for catastrophic    Stopped tobacco fall 2017 . ocass etoh.  No rec drugs.   Daughter patient in our practice   [name]   Social Determinants of Health   Financial Resource Strain: Not on file  Food Insecurity: Not on file  Transportation Needs: Not on file  Physical Activity: Not on file  Stress: Not on file  Social Connections: Not on file    Outpatient Medications Prior to Visit  Medication Sig Dispense Refill  . albuterol (VENTOLIN HFA) 108 (90 Base) MCG/ACT inhaler Inhale 2 puffs into the lungs every 6 (six) hours as needed for wheezing or shortness of breath. 8.5 g 1  . ALPRAZolam (XANAX) 0.5 MG tablet TAKE ONE TABLET BY MOUTH THREE TIMES A DAY AS NEEDED FOR ANXIETY MAX 3 TABLETS PER 24 HOURS 30 tablet 0  . Calcium Carb-Cholecalciferol (CALCIUM+D3) 600-800 MG-UNIT TABS Take 1 tablet by mouth daily with breakfast.    . diphenhydrAMINE (BENADRYL) 25 MG tablet Take 25 mg by mouth at bedtime as needed for allergies or sleep.     . metoprolol tartrate (LOPRESSOR) 25 MG tablet TAKE ONE TABLET BY  MOUTH TWICE A DAY 180 tablet 3  . metroNIDAZOLE (METROGEL VAGINAL) 0.75 % vaginal gel  Place 1 Applicatorful vaginally 2 (two) times daily. 70 g 0  . Multiple Vitamins-Minerals (ALIVE ONCE DAILY WOMENS 50+) TABS Take 1 tablet by mouth daily.    . naproxen sodium (ANAPROX) 220 MG tablet Take 440 mg by mouth every morning.     . nystatin (MYCOSTATIN/NYSTOP) powder Apply 1 application topically 3 (three) times daily. 120 g 1  . nystatin-triamcinolone (MYCOLOG II) cream Apply 1 application topically 2 (two) times daily as needed. 45 g 0  . Probiotic Product (TRUBIOTICS) CAPS Take 1 capsule by mouth daily.    . rosuvastatin (CRESTOR) 5 MG tablet Take 1 tablet (5 mg total) by mouth daily. 90 tablet 1  . citalopram (CELEXA) 20 MG tablet TAKE ONE TABLET BY MOUTH DAILY 90 tablet 1  . dicyclomine (BENTYL) 10 MG capsule Take 1 capsule (10 mg total) by mouth every 6 (six) hours as needed (abdominal spasms). 60 capsule 2  . metroNIDAZOLE (FLAGYL) 500 MG tablet     . predniSONE (DELTASONE) 10 MG tablet Take 2 tablets (20 mg total) by mouth daily with breakfast. 60 tablet 0   No facility-administered medications prior to visit.     EXAM:  BP (!) 146/76 (BP Location: Left Arm, Patient Position: Sitting, Cuff Size: Normal)   Pulse (!) 53   Temp 98.2 F (36.8 C) (Oral)   Ht 5\' 1"  (1.549 m)   Wt 157 lb 9.6 oz (71.5 kg)   SpO2 97%   BMI 29.78 kg/m   Body mass index is 29.78 kg/m. Wt Readings from Last 3 Encounters:  08/26/20 157 lb 9.6 oz (71.5 kg)  04/13/20 161 lb (73 kg)  03/16/20 161 lb (73 kg)    Physical Exam: Vital signs reviewed 13/08/21 is a well-developed well-nourished alert cooperative    who appearsr stated age in no acute distress.  HEENT: normocephalic atraumatic , Eyes: PERRL EOM's full, conjunctiva clear,tenderness., Ears: no deformity EAC's clear TMs with normal landmarks. Mouth: masked NECK: supple without masses, thyromegaly or bruits. CHEST/PULM:  Clear to auscultation and  percussion breath sounds equal no wheeze , rales or rhonchi. No chest wall deformities or tenderness. Breast: normal by inspection . No dimpling, discharge, masses, tenderness or discharge . CV: PMI is nondisplaced, S1 S2 no gallops, murmurs, rubs. Peripheral pulses are full without delay.No JVD .  ABDOMEN: Bowel sounds normal nontender  No guard or rebound, no hepato splenomegal no CVA tenderness.  No hernia. Extremtities:  No clubbing cyanosis or edema, no acute joint swelling or redness no focal atrophy OA changes thumb  NEURO:  Oriented x3, cranial nerves 3-12 appear to be intact, no obvious focal weakness,gait within normal limits no abnormal reflexes or asymmetrical SKIN: No acute rashes normal turgor, color, no bruising or petechiae. PSYCH: Oriented, good eye contact, no obvious depression anxiety, cognition and judgment appear normal. LN: no cervical axillary i adenopathy  Lab Results  Component Value Date   WBC 7.2 06/10/2019   HGB 14.4 06/10/2019   HCT 43.2 06/10/2019   PLT 294.0 06/10/2019   GLUCOSE 82 06/10/2019   CHOL 174 08/08/2019   TRIG 103 08/08/2019   HDL 74 08/08/2019   LDLCALC 82 08/08/2019   ALT 34 (H) 08/08/2019   AST 33 08/08/2019   NA 140 06/10/2019   K 4.4 06/10/2019   CL 103 06/10/2019   CREATININE 0.78 06/10/2019   BUN 18 06/10/2019   CO2 29 06/10/2019   TSH 1.92 06/10/2019   HGBA1C 5.6 06/10/2019    BP Readings from Last  3 Encounters:  08/26/20 (!) 146/76  09/24/19 128/84  07/31/19 130/70    Lab plan  reviewed with patient come back for fasting labs   ASSESSMENT AND PLAN:  Discussed the following assessment and plan:    ICD-10-CM   1. Visit for preventive health examination  Z00.00   2. Medication management  Z79.899   3. Anxiety state  F41.1    cont med  fu depending on how doing  dsic strategies    4. Arthritis  M19.90    prob oa djd  hads nad back  joint protection   5. Elevated blood pressure reading  R03.0   check bp and  Plan  intervention if not at goal  Due for  Yearly cards visit  No sx  At this time. Monitoring labs  Return for fasting lab appt   then   cpx in 6-12 months   depending on BP  readings .  Patient Care Team: Whisper Kurka, Neta Mends, MD as PCP - General (Internal Medicine) Runell Gess, MD as PCP - Cardiology (Cardiology) Mitchel Honour, DO as Attending Physician (Obstetrics and Gynecology) Patient Instructions   Bp is up today goal is 130/80 or below  Please bring your blood pressure cuff to next appointment Take blood pressure readings twice a day  -7days and record .     Take 2 -3 readings at each sitting .   Can send in readings  by My Chart.     Before checking your blood pressure make sure: You are seated and quite for 5 min before checking Feet are flat on the floor Siting in chair with your back supported straight up and down Arm resting on table or arm of chair at heart level Bladder is empty You have NOT had caffeine or tobacco within the last 30 min  [email or web address] Make appt with  Cardiology due  May 2022  Make lab appt  Fasting I will place orders .  Arthritis  probably osteoarthritis   Can try topical voltaren otc  Up to 4 x per day  And also   Address  Avoiding stress on joint and keep muscke s strong.  Try taking 1/2 alprazolam as needed  And  Continue   Social outreach.   Plan rov in 6-12 months depending  On how doing    Health Maintenance, Female Adopting a healthy lifestyle and getting preventive care are important in promoting health and wellness. Ask your health care provider about:  The right schedule for you to have regular tests and exams.  Things you can do on your own to prevent diseases and keep yourself healthy. What should I know about diet, weight, and exercise? Eat a healthy diet  Eat a diet that includes plenty of vegetables, fruits, low-fat dairy products, and lean protein.  Do not eat a lot of foods that are high in solid fats, added sugars, or  sodium.   Maintain a healthy weight Body mass index (BMI) is used to identify weight problems. It estimates body fat based on height and weight. Your health care provider can help determine your BMI and help you achieve or maintain a healthy weight. Get regular exercise Get regular exercise. This is one of the most important things you can do for your health. Most adults should:  Exercise for at least 150 minutes each week. The exercise should increase your heart rate and make you sweat (moderate-intensity exercise).  Do strengthening exercises at least twice a week. This is in  addition to the moderate-intensity exercise.  Spend less time sitting. Even light physical activity can be beneficial. Watch cholesterol and blood lipids Have your blood tested for lipids and cholesterol at 65 years of age, then have this test every 5 years. Have your cholesterol levels checked more often if:  Your lipid or cholesterol levels are high.  You are older than 65 years of age.  You are at high risk for heart disease. What should I know about cancer screening? Depending on your health history and family history, you may need to have cancer screening at various ages. This may include screening for:  Breast cancer.  Cervical cancer.  Colorectal cancer.  Skin cancer.  Lung cancer. What should I know about heart disease, diabetes, and high blood pressure? Blood pressure and heart disease  High blood pressure causes heart disease and increases the risk of stroke. This is more likely to develop in people who have high blood pressure readings, are of African descent, or are overweight.  Have your blood pressure checked: ? Every 3-5 years if you are 28-13 years of age. ? Every year if you are 60 years old or older. Diabetes Have regular diabetes screenings. This checks your fasting blood sugar level. Have the screening done:  Once every three years after age 39 if you are at a normal weight and  have a low risk for diabetes.  More often and at a younger age if you are overweight or have a high risk for diabetes. What should I know about preventing infection? Hepatitis B If you have a higher risk for hepatitis B, you should be screened for this virus. Talk with your health care provider to find out if you are at risk for hepatitis B infection. Hepatitis C Testing is recommended for:  Everyone born from [birth year] through 1965.  Anyone with known risk factors for hepatitis C. Sexually transmitted infections (STIs)  Get screened for STIs, including gonorrhea and chlamydia, if: ? You are sexually active and are younger than 65 years of age. ? You are older than 65 years of age and your health care provider tells you that you are at risk for this type of infection. ? Your sexual activity has changed since you were last screened, and you are at increased risk for chlamydia or gonorrhea. Ask your health care provider if you are at risk.  Ask your health care provider about whether you are at high risk for HIV. Your health care provider may recommend a prescription medicine to help prevent HIV infection. If you choose to take medicine to prevent HIV, you should first get tested for HIV. You should then be tested every 3 months for as long as you are taking the medicine. Pregnancy  If you are about to stop having your period (premenopausal) and you may become pregnant, seek counseling before you get pregnant.  Take 400 to 800 micrograms (mcg) of folic acid every day if you become pregnant.  Ask for birth control (contraception) if you want to prevent pregnancy. Osteoporosis and menopause Osteoporosis is a disease in which the bones lose minerals and strength with aging. This can result in bone fractures. If you are 44 years old or older, or if you are at risk for osteoporosis and fractures, ask your health care provider if you should:  Be screened for bone loss.  Take a calcium or vitamin D  supplement to lower your risk of fractures.  Be given hormone replacement therapy (HRT) to treat symptoms  of menopause. Follow these instructions at home: Lifestyle  Do not use any products that contain nicotine or tobacco, such as cigarettes, e-cigarettes, and chewing tobacco. If you need help quitting, ask your health care provider.  Do not use street drugs.  Do not share needles.  Ask your health care provider for help if you need support or information about quitting drugs. Alcohol use  Do not drink alcohol if: ? Your health care provider tells you not to drink. ? You are pregnant, may be pregnant, or are planning to become pregnant.  If you drink alcohol: ? Limit how much you use to 0-1 drink a day. ? Limit intake if you are breastfeeding.  Be aware of how much alcohol is in your drink. In the [place] one drink equals one 12 oz bottle of beer (355 mL), one 5 oz glass of wine (148 mL), or one 1 oz glass of hard liquor (44 mL). General instructions  Schedule regular health, dental, and eye exams.  Stay current with your vaccines.  Tell your health care provider if: ? You often feel depressed. ? You have ever been abused or do not feel safe at home. Summary  Adopting a healthy lifestyle and getting preventive care are important in promoting health and wellness.  Follow your health care provider's instructions about healthy diet, exercising, and getting tested or screened for diseases.  Follow your health care provider's instructions on monitoring your cholesterol and blood pressure. This information is not intended to replace advice given to you by your health care provider. Make sure you discuss any questions you have with your health care provider. Document Revised: 04/18/2018 Document Reviewed: 04/18/2018 Elsevier Patient Education  2021 [employer].       Calvin. Reinaldo Helt M.D.

## 2020-08-26 ENCOUNTER — Other Ambulatory Visit: Payer: Self-pay

## 2020-08-26 ENCOUNTER — Ambulatory Visit (INDEPENDENT_AMBULATORY_CARE_PROVIDER_SITE_OTHER): Payer: PPO | Admitting: Internal Medicine

## 2020-08-26 ENCOUNTER — Encounter: Payer: Self-pay | Admitting: Internal Medicine

## 2020-08-26 ENCOUNTER — Other Ambulatory Visit: Payer: Self-pay | Admitting: Internal Medicine

## 2020-08-26 VITALS — BP 146/76 | HR 53 | Temp 98.2°F | Ht 61.0 in | Wt 157.6 lb

## 2020-08-26 DIAGNOSIS — M1990 Unspecified osteoarthritis, unspecified site: Secondary | ICD-10-CM | POA: Diagnosis not present

## 2020-08-26 DIAGNOSIS — R030 Elevated blood-pressure reading, without diagnosis of hypertension: Secondary | ICD-10-CM

## 2020-08-26 DIAGNOSIS — Z79899 Other long term (current) drug therapy: Secondary | ICD-10-CM

## 2020-08-26 DIAGNOSIS — Z0000 Encounter for general adult medical examination without abnormal findings: Secondary | ICD-10-CM | POA: Diagnosis not present

## 2020-08-26 DIAGNOSIS — I447 Left bundle-branch block, unspecified: Secondary | ICD-10-CM

## 2020-08-26 DIAGNOSIS — E785 Hyperlipidemia, unspecified: Secondary | ICD-10-CM

## 2020-08-26 DIAGNOSIS — Z5181 Encounter for therapeutic drug level monitoring: Secondary | ICD-10-CM

## 2020-08-26 DIAGNOSIS — I2510 Atherosclerotic heart disease of native coronary artery without angina pectoris: Secondary | ICD-10-CM

## 2020-08-26 DIAGNOSIS — F411 Generalized anxiety disorder: Secondary | ICD-10-CM

## 2020-08-26 NOTE — Patient Instructions (Addendum)
Bp is up today goal is 130/80 or below  Please bring your blood pressure cuff to next appointment Take blood pressure readings twice a day  -7days and record .     Take 2 -3 readings at each sitting .   Can send in readings  by My Chart.     Before checking your blood pressure make sure: You are seated and quite for 5 min before checking Feet are flat on the floor Siting in chair with your back supported straight up and down Arm resting on table or arm of chair at heart level Bladder is empty You have NOT had caffeine or tobacco within the last 30 min  [email or web address] Make appt with  Cardiology due  May 2022  Make lab appt  Fasting I will place orders .  Arthritis  probably osteoarthritis   Can try topical voltaren otc  Up to 4 x per day  And also   Address  Avoiding stress on joint and keep muscke s strong.  Try taking 1/2 alprazolam as needed  And  Continue   Social outreach.   Plan rov in 6-12 months depending  On how doing    Health Maintenance, Female Adopting a healthy lifestyle and getting preventive care are important in promoting health and wellness. Ask your health care provider about:  The right schedule for you to have regular tests and exams.  Things you can do on your own to prevent diseases and keep yourself healthy. What should I know about diet, weight, and exercise? Eat a healthy diet  Eat a diet that includes plenty of vegetables, fruits, low-fat dairy products, and lean protein.  Do not eat a lot of foods that are high in solid fats, added sugars, or sodium.   Maintain a healthy weight Body mass index (BMI) is used to identify weight problems. It estimates body fat based on height and weight. Your health care provider can help determine your BMI and help you achieve or maintain a healthy weight. Get regular exercise Get regular exercise. This is one of the most important things you can do for your health. Most adults should:  Exercise for at least 150  minutes each week. The exercise should increase your heart rate and make you sweat (moderate-intensity exercise).  Do strengthening exercises at least twice a week. This is in addition to the moderate-intensity exercise.  Spend less time sitting. Even light physical activity can be beneficial. Watch cholesterol and blood lipids Have your blood tested for lipids and cholesterol at 65 years of age, then have this test every 5 years. Have your cholesterol levels checked more often if:  Your lipid or cholesterol levels are high.  You are older than 65 years of age.  You are at high risk for heart disease. What should I know about cancer screening? Depending on your health history and family history, you may need to have cancer screening at various ages. This may include screening for:  Breast cancer.  Cervical cancer.  Colorectal cancer.  Skin cancer.  Lung cancer. What should I know about heart disease, diabetes, and high blood pressure? Blood pressure and heart disease  High blood pressure causes heart disease and increases the risk of stroke. This is more likely to develop in people who have high blood pressure readings, are of African descent, or are overweight.  Have your blood pressure checked: ? Every 3-5 years if you are 51-78 years of age. ? Every year if you are 40 years  old or older. Diabetes Have regular diabetes screenings. This checks your fasting blood sugar level. Have the screening done:  Once every three years after age 4 if you are at a normal weight and have a low risk for diabetes.  More often and at a younger age if you are overweight or have a high risk for diabetes. What should I know about preventing infection? Hepatitis B If you have a higher risk for hepatitis B, you should be screened for this virus. Talk with your health care provider to find out if you are at risk for hepatitis B infection. Hepatitis C Testing is recommended for:  Everyone born  from [birth year] through 1965.  Anyone with known risk factors for hepatitis C. Sexually transmitted infections (STIs)  Get screened for STIs, including gonorrhea and chlamydia, if: ? You are sexually active and are younger than 65 years of age. ? You are older than 65 years of age and your health care provider tells you that you are at risk for this type of infection. ? Your sexual activity has changed since you were last screened, and you are at increased risk for chlamydia or gonorrhea. Ask your health care provider if you are at risk.  Ask your health care provider about whether you are at high risk for HIV. Your health care provider may recommend a prescription medicine to help prevent HIV infection. If you choose to take medicine to prevent HIV, you should first get tested for HIV. You should then be tested every 3 months for as long as you are taking the medicine. Pregnancy  If you are about to stop having your period (premenopausal) and you may become pregnant, seek counseling before you get pregnant.  Take 400 to 800 micrograms (mcg) of folic acid every day if you become pregnant.  Ask for birth control (contraception) if you want to prevent pregnancy. Osteoporosis and menopause Osteoporosis is a disease in which the bones lose minerals and strength with aging. This can result in bone fractures. If you are 72 years old or older, or if you are at risk for osteoporosis and fractures, ask your health care provider if you should:  Be screened for bone loss.  Take a calcium or vitamin D supplement to lower your risk of fractures.  Be given hormone replacement therapy (HRT) to treat symptoms of menopause. Follow these instructions at home: Lifestyle  Do not use any products that contain nicotine or tobacco, such as cigarettes, e-cigarettes, and chewing tobacco. If you need help quitting, ask your health care provider.  Do not use street drugs.  Do not share needles.  Ask your health  care provider for help if you need support or information about quitting drugs. Alcohol use  Do not drink alcohol if: ? Your health care provider tells you not to drink. ? You are pregnant, may be pregnant, or are planning to become pregnant.  If you drink alcohol: ? Limit how much you use to 0-1 drink a day. ? Limit intake if you are breastfeeding.  Be aware of how much alcohol is in your drink. In the [place] one drink equals one 12 oz bottle of beer (355 mL), one 5 oz glass of wine (148 mL), or one 1 oz glass of hard liquor (44 mL). General instructions  Schedule regular health, dental, and eye exams.  Stay current with your vaccines.  Tell your health care provider if: ? You often feel depressed. ? You have ever been abused or do  not feel safe at home. Summary  Adopting a healthy lifestyle and getting preventive care are important in promoting health and wellness.  Follow your health care provider's instructions about healthy diet, exercising, and getting tested or screened for diseases.  Follow your health care provider's instructions on monitoring your cholesterol and blood pressure. This information is not intended to replace advice given to you by your health care provider. Make sure you discuss any questions you have with your health care provider. Document Revised: 04/18/2018 Document Reviewed: 04/18/2018 Elsevier Patient Education  2021 [employer].

## 2020-08-28 ENCOUNTER — Other Ambulatory Visit (INDEPENDENT_AMBULATORY_CARE_PROVIDER_SITE_OTHER): Payer: PPO

## 2020-08-28 ENCOUNTER — Other Ambulatory Visit: Payer: Self-pay

## 2020-08-28 DIAGNOSIS — Z79899 Other long term (current) drug therapy: Secondary | ICD-10-CM

## 2020-08-28 DIAGNOSIS — Z5181 Encounter for therapeutic drug level monitoring: Secondary | ICD-10-CM | POA: Diagnosis not present

## 2020-08-28 DIAGNOSIS — E785 Hyperlipidemia, unspecified: Secondary | ICD-10-CM

## 2020-08-28 DIAGNOSIS — I447 Left bundle-branch block, unspecified: Secondary | ICD-10-CM

## 2020-08-28 DIAGNOSIS — I2510 Atherosclerotic heart disease of native coronary artery without angina pectoris: Secondary | ICD-10-CM | POA: Diagnosis not present

## 2020-08-28 DIAGNOSIS — M1990 Unspecified osteoarthritis, unspecified site: Secondary | ICD-10-CM

## 2020-08-28 DIAGNOSIS — R030 Elevated blood-pressure reading, without diagnosis of hypertension: Secondary | ICD-10-CM | POA: Diagnosis not present

## 2020-08-28 LAB — BASIC METABOLIC PANEL
BUN: 20 mg/dL (ref 6–23)
CO2: 30 mEq/L (ref 19–32)
Calcium: 9 mg/dL (ref 8.4–10.5)
Chloride: 103 mEq/L (ref 96–112)
Creatinine, Ser: 0.65 mg/dL (ref 0.40–1.20)
GFR: 92.62 mL/min (ref >60.00)
Glucose, Bld: 86 mg/dL (ref 70–99)
Potassium: 4.4 mEq/L (ref 3.5–5.1)
Sodium: 141 mEq/L (ref 135–145)

## 2020-08-28 LAB — HEPATIC FUNCTION PANEL
ALT: 24 U/L (ref 0–35)
AST: 25 U/L (ref 0–37)
Albumin: 3.9 g/dL (ref 3.5–5.2)
Alkaline Phosphatase: 55 U/L (ref 39–117)
Bilirubin, Direct: 0.1 mg/dL (ref 0.0–0.3)
Total Bilirubin: 0.8 mg/dL (ref 0.2–1.2)
Total Protein: 6.1 g/dL (ref 6.0–8.3)

## 2020-08-28 LAB — LIPID PANEL
Cholesterol: 150 mg/dL (ref 0–200)
HDL: 71.1 mg/dL (ref >39.00)
LDL Cholesterol: 56 mg/dL (ref 0–99)
NonHDL: 78.89
Total CHOL/HDL Ratio: 2
Triglycerides: 115 mg/dL (ref 0.0–149.0)
VLDL: 23 mg/dL (ref 0.0–40.0)

## 2020-08-28 LAB — CBC WITH DIFFERENTIAL/PLATELET
Basophils Absolute: 0 10*3/uL (ref 0.0–0.1)
Basophils Relative: 0.7 % (ref 0.0–3.0)
Eosinophils Absolute: 0.2 10*3/uL (ref 0.0–0.7)
Eosinophils Relative: 3.5 % (ref 0.0–5.0)
HCT: 40.1 % (ref 36.0–46.0)
Hemoglobin: 13.4 g/dL (ref 12.0–15.0)
Lymphocytes Relative: 32.6 % (ref 12.0–46.0)
Lymphs Abs: 1.6 10*3/uL (ref 0.7–4.0)
MCHC: 33.3 g/dL (ref 30.0–36.0)
MCV: 90.4 fl (ref 78.0–100.0)
Monocytes Absolute: 0.4 10*3/uL (ref 0.1–1.0)
Monocytes Relative: 8.3 % (ref 3.0–12.0)
Neutro Abs: 2.7 10*3/uL (ref 1.4–7.7)
Neutrophils Relative %: 54.9 % (ref 43.0–77.0)
Platelets: 236 10*3/uL (ref 150.0–400.0)
RBC: 4.44 Mil/uL (ref 3.87–5.11)
RDW: 13 % (ref 11.5–15.5)
WBC: 4.8 10*3/uL (ref 4.0–10.5)

## 2020-08-28 LAB — HEMOGLOBIN A1C: Hgb A1c MFr Bld: 5.5 % (ref 4.6–6.5)

## 2020-08-28 LAB — TSH: TSH: 0.77 u[IU]/mL (ref 0.35–4.50)

## 2020-08-30 NOTE — Progress Notes (Signed)
Labs look good forwarding to cardiology  team also

## 2020-09-30 ENCOUNTER — Other Ambulatory Visit: Payer: Self-pay

## 2020-09-30 ENCOUNTER — Ambulatory Visit: Payer: 59

## 2020-09-30 ENCOUNTER — Ambulatory Visit
Admission: RE | Admit: 2020-09-30 | Discharge: 2020-09-30 | Disposition: A | Discharge status: Home / Self Care | Payer: PPO | Source: Ambulatory Visit | Attending: Internal Medicine | Admitting: Internal Medicine

## 2020-09-30 DIAGNOSIS — Z1231 Encounter for screening mammogram for malignant neoplasm of breast: Secondary | ICD-10-CM | POA: Diagnosis not present

## 2020-10-06 ENCOUNTER — Other Ambulatory Visit: Payer: Self-pay

## 2020-10-06 ENCOUNTER — Ambulatory Visit: Payer: PPO | Admitting: Cardiovascular Disease

## 2020-10-06 ENCOUNTER — Encounter: Payer: Self-pay | Admitting: Cardiovascular Disease

## 2020-10-06 VITALS — BP 122/70 | HR 54 | Ht 61.0 in | Wt 155.0 lb

## 2020-10-06 DIAGNOSIS — I5021 Acute systolic (congestive) heart failure: Secondary | ICD-10-CM

## 2020-10-06 DIAGNOSIS — I214 Non-ST elevation (NSTEMI) myocardial infarction: Secondary | ICD-10-CM

## 2020-10-06 DIAGNOSIS — R002 Palpitations: Secondary | ICD-10-CM | POA: Diagnosis not present

## 2020-10-06 DIAGNOSIS — I447 Left bundle-branch block, unspecified: Secondary | ICD-10-CM | POA: Diagnosis not present

## 2020-10-06 DIAGNOSIS — E782 Mixed hyperlipidemia: Secondary | ICD-10-CM | POA: Diagnosis not present

## 2020-10-06 NOTE — Progress Notes (Signed)
10/06/2020 Valerie Love   1955/09/13  [id number]  Primary Physician Panosh, Neta Mends, MD Primary Cardiologist: Runell Gess MD Milagros Loll, [place], [place]  HPI:  Valerie Love is a 65 y.o.  widowed Caucasian female mother of 3 children, grandmother of 3 grandchildren who I last saw in the office 10/25/2019.Marland KitchenShe was hospitalized 10/30/2018 for 3 days for a non-STEMI, chest pain and what was presumably PSVT. She does have left bundle branch block. She apparently had a Myoview stress test performed 5/12 revealing EF of 48% with fixed septal defect consistent with bundle branch block artifact. She does not smoke. 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 During her hospitalization her troponins rose to about 1000. Based on this she underwent diagnostic coronary angiography 10/31/2018 which was entirely normal with normal coronary arteries. EF was 40 to 45% with septal dyssynchrony. She was seen by Dr. Elberta Fortis for EP evaluation who placed her on a beta-blocker. Since discharge she has been doing her normal work routine, walks and works in her garden without significant palpitations. She says the beta-blocker has improved this. If she has recurrent prolonged symptoms we talked about the possibility of loop recorder implantation. She did have an event monitor which resulted 12/19/2018 that showed only occasional PVCs.  Since I saw her in the office a year ago she continues to do well.  She has minimal PACs PVCs in the afternoon on low-dose beta-blocker with a resting bradycardia.  Her last 2D echo performed 09/10/2019 was entirely normal with an EF of 55 to 60%.  She denies chest pain or shortness of breath.   Current Meds  Medication Sig  . albuterol (VENTOLIN HFA) 108 (90 Base) MCG/ACT inhaler Inhale 2 puffs into the lungs every 6 (six) hours as needed for wheezing or shortness of breath.  . ALPRAZolam (XANAX) 0.5 MG tablet TAKE ONE TABLET BY MOUTH THREE TIMES A DAY  AS NEEDED FOR ANXIETY MAX 3 TABLETS PER 24 HOURS  . Calcium Carb-Cholecalciferol (CALCIUM+D3) 600-800 MG-UNIT TABS Take 1 tablet by mouth daily with breakfast.  . citalopram (CELEXA) 20 MG tablet TAKE ONE TABLET BY MOUTH DAILY  . diphenhydrAMINE (BENADRYL) 25 MG tablet Take 25 mg by mouth at bedtime as needed for allergies or sleep.   . metoprolol tartrate (LOPRESSOR) 25 MG tablet TAKE ONE TABLET BY MOUTH TWICE A DAY  . metroNIDAZOLE (METROGEL VAGINAL) 0.75 % vaginal gel Place 1 Applicatorful vaginally 2 (two) times daily.  . Multiple Vitamins-Minerals (ALIVE ONCE DAILY WOMENS 50+) TABS Take 1 tablet by mouth daily.  . naproxen sodium (ANAPROX) 220 MG tablet Take 440 mg by mouth every morning.   . nystatin (MYCOSTATIN/NYSTOP) powder Apply 1 application topically 3 (three) times daily.  Marland Kitchen nystatin-triamcinolone (MYCOLOG II) cream Apply 1 application topically 2 (two) times daily as needed.  . Probiotic Product (TRUBIOTICS) CAPS Take 1 capsule by mouth daily.  . rosuvastatin (CRESTOR) 5 MG tablet Take 1 tablet (5 mg total) by mouth daily.     Allergies  Allergen Reactions  . Codeine Itching, Swelling, Rash and Other (See Comments)  . Lactose Intolerance (Gi) Diarrhea    Social History   Socioeconomic History  . Marital status: Widowed    Spouse name: Not on file  . Number of children: 3  . Years of education: Not on file  . Highest education level: Not on file  Occupational History  . Occupation: self    Comment: Geneticist, molecular  Tobacco Use  .  Smoking status: Former Smoker    Packs/day: 0.25    Years: 40.00    Pack years: 10.00    Types: Cigarettes    Quit date: 01/06/2016    Years since quitting: 4.7  . Smokeless tobacco: Never Used  . Tobacco comment: none in about 1 week  Substance and Sexual Activity  . Alcohol use: Yes    Alcohol/week: 0.0 standard drinks    Comment: socially-wine- daily  . Drug use: No  . Sexual activity: Not on file  Other Topics Concern  .  Not on file  Social History Narrative   Husband passed away on 2008-03-07 hep c and cirrhosis.   She is hep c negative 2006   G3P3   HH of 2-3  son no pets    No falls .  Has smoke detector and wears seat belts. POsitive  firearms. No excess sun exposure.    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   Had time of nono insuranced and cannot affort 800 per month for catastrophic    Stopped tobacco fall 2017 . ocass etoh.  No rec drugs.   Daughter patient in our practice   [name]   Social Determinants of Health   Financial Resource Strain: Not on file  Food Insecurity: Not on file  Transportation Needs: Not on file  Physical Activity: Not on file  Stress: Not on file  Social Connections: Not on file  Intimate Partner Violence: Not on file     Review of Systems: General: negative for chills, fever, night sweats or weight changes.  Cardiovascular: negative for chest pain, dyspnea on exertion, edema, orthopnea, palpitations, paroxysmal nocturnal dyspnea or shortness of breath Dermatological: negative for rash Respiratory: negative for cough or wheezing Urologic: negative for hematuria Abdominal: negative for nausea, vomiting, diarrhea, bright red blood per rectum, melena, or hematemesis Neurologic: negative for visual changes, syncope, or dizziness All other systems reviewed and are otherwise negative except as noted above.    Blood pressure 122/70, pulse (!) 54, height 5\' 1"  (1.549 m), weight 155 lb (70.3 kg), SpO2 96 %.  General appearance: alert and no distress Neck: no adenopathy, no carotid bruit, no JVD, supple, symmetrical, trachea midline and thyroid not enlarged, symmetric, no tenderness/mass/nodules Lungs: clear to auscultation bilaterally Heart: regular rate and rhythm, S1, S2 normal, no murmur, click, rub or gallop Extremities: extremities normal, atraumatic, no cyanosis or edema Pulses: 2+ and symmetric Skin: Skin color, texture, turgor normal.  No rashes or lesions Neurologic: Alert and oriented X 3, normal strength and tone. Normal symmetric reflexes. Normal coordination and gait  EKG sinus bradycardia 54 with left bundle branch block.  I personally reviewed this EKG.  ASSESSMENT AND PLAN:   Palpitations History of palpitations found to be PVCs on event monitoring performed 11/08/2018.  She is on low-dose beta-blocker and has resting bradycardia.  She does have some palpitations in the afternoon hours.  I am not concerned about this.  LBBB (left bundle branch block) Chronic  Hyperlipidemia History of hyperlipidemia on low-dose statin therapy with recent lipid profile performed 08/28/2020 revealing total cholesterol of 150, LDL 56 and HDL 71.  NSTEMI (non-ST elevated myocardial infarction) (HCC) History of non-STEMI in the past with cardiac catheterization performed 10/31/2018 revealing essentially normal coronary arteries.  Acute systolic heart failure (HCC) History of LV dysfunction in the past EF of 40 to 45%.  Since adding low-dose beta-blocker her EF has come up to normal by 2D  echo performed 09/10/2019.  This may have been PVC mediated.  She does have left bundle branch block with septal dyssynchrony.      Runell Gess MD FACP,FACC,FAHA, [facility] 10/06/2020 10:12 AM

## 2020-10-06 NOTE — Assessment & Plan Note (Signed)
History of hyperlipidemia on low-dose statin therapy with recent lipid profile performed 08/28/2020 revealing total cholesterol of 150, LDL 56 and HDL 71.

## 2020-10-06 NOTE — Assessment & Plan Note (Signed)
History of LV dysfunction in the past EF of 40 to 45%.  Since adding low-dose beta-blocker her EF has come up to normal by 2D echo performed 09/10/2019.  This may have been PVC mediated.  She does have left bundle branch block with septal dyssynchrony.

## 2020-10-06 NOTE — Assessment & Plan Note (Signed)
History of non-STEMI in the past with cardiac catheterization performed 10/31/2018 revealing essentially normal coronary arteries.

## 2020-10-06 NOTE — Assessment & Plan Note (Signed)
Chronic. 

## 2020-10-06 NOTE — Patient Instructions (Signed)

## 2020-10-06 NOTE — Assessment & Plan Note (Signed)
History of palpitations found to be PVCs on event monitoring performed 11/08/2018.  She is on low-dose beta-blocker and has resting bradycardia.  She does have some palpitations in the afternoon hours.  I am not concerned about this.

## 2020-10-23 NOTE — Telephone Encounter (Unsigned)
Apology about the delayed response  I was out of office  for 2 weeks.  So it seems that your Bp reading was very good when you saw Dr Allyson Sabal.   Would want your readings to average 130/80 and below as possible  So get  and record twice a day BP reading for 3 days in a row   to see if  mostly at goal , and will share info w Dr Allyson Sabal .

## 2020-11-29 ENCOUNTER — Other Ambulatory Visit: Payer: Self-pay | Admitting: Internal Medicine

## 2020-12-01 ENCOUNTER — Telehealth: Payer: Self-pay | Admitting: Cardiovascular Disease

## 2020-12-01 DIAGNOSIS — E7800 Pure hypercholesterolemia, unspecified: Secondary | ICD-10-CM

## 2020-12-01 MED ORDER — ROSUVASTATIN CALCIUM 5 MG PO TABS
5.0000 mg | ORAL_TABLET | Freq: Every day | ORAL | 3 refills | Status: DC
Start: 2020-12-01 — End: 2021-04-26

## 2020-12-01 NOTE — Telephone Encounter (Signed)
   *  STAT* If patient is at the pharmacy, call can be transferred to refill team.   1. Which medications need to be refilled? (please list name of each medication and dose if known)   rosuvastatin (CRESTOR) 5 MG tablet     2. Which pharmacy/location (including street and city if local pharmacy) is medication to be sent to?[facility] [id number] - [place], [place] - [address]  3. Do they need a 30 day or 90 day supply? 90 days   Pt needs refill today, pt is out of meds

## 2020-12-21 ENCOUNTER — Telehealth: Payer: Self-pay | Admitting: Cardiovascular Disease

## 2020-12-21 MED ORDER — METOPROLOL TARTRATE 25 MG PO TABS: 25.0000 mg | ORAL_TABLET | Freq: Two times a day (BID) | ORAL | 3 refills | Status: DC

## 2020-12-21 NOTE — Telephone Encounter (Signed)
*  STAT* If patient is at the pharmacy, call can be transferred to refill team.   1. Which medications need to be refilled? (please list name of each medication and dose if known) metoprolol tartrate (LOPRESSOR) 25 MG tablet  2. Which pharmacy/location (including street and city if local pharmacy) is medication to be sent to? [facility] [id number] - [place], [place] - [address]  3. Do they need a 30 day or 90 day supply? 90 day   Patient is out of medication.

## 2021-02-21 ENCOUNTER — Other Ambulatory Visit: Payer: Self-pay | Admitting: Internal Medicine

## 2021-02-23 ENCOUNTER — Other Ambulatory Visit: Payer: Self-pay | Admitting: Internal Medicine

## 2021-03-01 ENCOUNTER — Other Ambulatory Visit: Payer: Self-pay | Admitting: Internal Medicine

## 2021-03-08 ENCOUNTER — Other Ambulatory Visit: Payer: Self-pay | Admitting: Internal Medicine

## 2021-03-25 DIAGNOSIS — J019 Acute sinusitis, unspecified: Secondary | ICD-10-CM | POA: Diagnosis not present

## 2021-03-25 DIAGNOSIS — J069 Acute upper respiratory infection, unspecified: Secondary | ICD-10-CM | POA: Diagnosis not present

## 2021-03-31 ENCOUNTER — Other Ambulatory Visit: Payer: Self-pay

## 2021-03-31 DIAGNOSIS — E7800 Pure hypercholesterolemia, unspecified: Secondary | ICD-10-CM

## 2021-04-12 ENCOUNTER — Other Ambulatory Visit: Payer: Self-pay

## 2021-04-15 ENCOUNTER — Telehealth: Payer: Self-pay

## 2021-04-15 MED ORDER — ALBUTEROL SULFATE HFA 108 (90 BASE) MCG/ACT IN AERS: 2.0000 | INHALATION_SPRAY | Freq: Four times a day (QID) | RESPIRATORY_TRACT | 1 refills | Status: DC | PRN

## 2021-04-15 NOTE — Addendum Note (Signed)
Addended by: Christy Sartorius on: 04/15/2021 11:49 AM   Modules accepted: Orders

## 2021-04-15 NOTE — Telephone Encounter (Signed)
Rx sent 

## 2021-04-15 NOTE — Telephone Encounter (Signed)
Patient called requesting Rx refill  albuterol (VENTOLIN HFA) [facility]) MCG/ACT inhaler  [facility] [phone] [address] [place], [place], [place] [address]

## 2021-04-16 ENCOUNTER — Other Ambulatory Visit: Payer: Self-pay

## 2021-04-26 ENCOUNTER — Other Ambulatory Visit: Payer: Self-pay

## 2021-04-26 DIAGNOSIS — E7800 Pure hypercholesterolemia, unspecified: Secondary | ICD-10-CM

## 2021-04-26 MED ORDER — ROSUVASTATIN CALCIUM 5 MG PO TABS: 5.0000 mg | ORAL_TABLET | Freq: Every day | ORAL | 3 refills | Status: DC

## 2021-06-09 ENCOUNTER — Telehealth: Payer: Self-pay | Admitting: Internal Medicine

## 2021-06-09 MED ORDER — ALPRAZOLAM 0.5 MG PO TABS: ORAL_TABLET | ORAL | 0 refills | Status: DC

## 2021-06-09 NOTE — Telephone Encounter (Signed)
I Sent in electronically .

## 2021-06-09 NOTE — Telephone Encounter (Signed)
Pt call and stated she need a refill on ALPRAZolam Prudy Feeler) 0.5 MG tablet sent to [facility] [address]

## 2021-06-22 ENCOUNTER — Encounter: Payer: Self-pay | Admitting: Internal Medicine

## 2021-06-22 ENCOUNTER — Ambulatory Visit (INDEPENDENT_AMBULATORY_CARE_PROVIDER_SITE_OTHER): Payer: PPO | Admitting: Internal Medicine

## 2021-06-22 VITALS — BP 108/60 | HR 60 | Temp 98.1°F | Ht 61.0 in | Wt 156.4 lb

## 2021-06-22 DIAGNOSIS — E785 Hyperlipidemia, unspecified: Secondary | ICD-10-CM | POA: Diagnosis not present

## 2021-06-22 DIAGNOSIS — M25511 Pain in right shoulder: Secondary | ICD-10-CM | POA: Diagnosis not present

## 2021-06-22 DIAGNOSIS — L304 Erythema intertrigo: Secondary | ICD-10-CM | POA: Diagnosis not present

## 2021-06-22 DIAGNOSIS — M479 Spondylosis, unspecified: Secondary | ICD-10-CM | POA: Diagnosis not present

## 2021-06-22 DIAGNOSIS — Z79899 Other long term (current) drug therapy: Secondary | ICD-10-CM

## 2021-06-22 DIAGNOSIS — G8929 Other chronic pain: Secondary | ICD-10-CM | POA: Diagnosis not present

## 2021-06-22 LAB — TSH: TSH: 0.95 u[IU]/mL (ref 0.35–5.50)

## 2021-06-22 LAB — CBC WITH DIFFERENTIAL/PLATELET
Basophils Absolute: 0 10*3/uL (ref 0.0–0.1)
Basophils Relative: 0.5 % (ref 0.0–3.0)
Eosinophils Absolute: 0.1 10*3/uL (ref 0.0–0.7)
Eosinophils Relative: 2.6 % (ref 0.0–5.0)
HCT: 41.2 % (ref 36.0–46.0)
Hemoglobin: 13.5 g/dL (ref 12.0–15.0)
Lymphocytes Relative: 30.6 % (ref 12.0–46.0)
Lymphs Abs: 1.6 10*3/uL (ref 0.7–4.0)
MCHC: 32.7 g/dL (ref 30.0–36.0)
MCV: 90.8 fl (ref 78.0–100.0)
Monocytes Absolute: 0.5 10*3/uL (ref 0.1–1.0)
Monocytes Relative: 8.5 % (ref 3.0–12.0)
Neutro Abs: 3.1 10*3/uL (ref 1.4–7.7)
Neutrophils Relative %: 57.8 % (ref 43.0–77.0)
Platelets: 261 10*3/uL (ref 150.0–400.0)
RBC: 4.54 Mil/uL (ref 3.87–5.11)
RDW: 14 % (ref 11.5–15.5)
WBC: 5.4 10*3/uL (ref 4.0–10.5)

## 2021-06-22 LAB — T4, FREE: Free T4: 0.86 ng/dL (ref 0.60–1.60)

## 2021-06-22 LAB — SEDIMENTATION RATE: Sed Rate: 10 mm/hr (ref 0–30)

## 2021-06-22 LAB — HEMOGLOBIN A1C: Hgb A1c MFr Bld: 5.6 % (ref 4.6–6.5)

## 2021-06-22 MED ORDER — NYSTATIN-TRIAMCINOLONE 100000-0.1 UNIT/GM-% EX CREA: 1.0000 "application " | TOPICAL_CREAM | Freq: Two times a day (BID) | CUTANEOUS | 0 refills | Status: DC | PRN

## 2021-06-22 MED ORDER — NYSTATIN 100000 UNIT/GM EX POWD: 1.0000 "application " | Freq: Three times a day (TID) | CUTANEOUS | 1 refills | Status: DC

## 2021-06-22 NOTE — Patient Instructions (Addendum)
Consider seeing a spine specialist .and or rheumatologist   .  Or at least .  Updated imaging.   Will decide after lab back  and review .  UPdated lab today to check for inflammation  . Consider  local care of the right shoulder pain     Can try off crestor for  a few weeks and then go back on to see  how  changes sx  but doesn't seem like   problem is from medication at this time.  Also will ask   [facility] pharmacist  reach out  about med and side effect potential.

## 2021-06-22 NOTE — Progress Notes (Signed)
Chief Complaint  Patient presents with   Medication Consultation    HPI: Valerie Love 66 y.o. come in for a number of concerns Has problems with hurting all over and pharmacist at [facility] suggested that the Crestor could be causing a problem that could be permanent she has problems pain right shoulder and near neck area sometimes hard to lift her arm up overhead is right-handed.  In the past has had physical therapy not sure if it was helpful.  She has been on Crestor 5 mg for about a year prescribed by Dr. Alvester Chou cardiology.  In the remote past had seen Dr. Sharol Given for back pain that was fairly severe and was treated 2 times with steroids short course which was quite helpful and made her feel much better and was able to maintain for a number of months.  She has had plain back x-rays done 2 or 3 years ago no other defined imaging although injections through MRI were considered she declined.  The difficulty with upper body right arm has been almost a year but worse over the last 6 months R handed  Works in cleaning but much less.  Concerned about her back as she tends to have to lean forward to walk.  No radiation or weakness in her legs  Presumption is that her problem is all osteoarthritis.  Is never seen a rheumatologist. ROS: See pertinent positives and negatives per HPI. I do not think she has had a bone density. She did see OB/GYN for a bacterial infection after we had seen her for possible yeast infection was treated with something for bacteria. More recently is having some recurrent irritation and seem to well respond to nystatin powder and an as needed ointment cream of triamcinolone nystatin.  But she only puts it externally posterior perineum.  And not every day.  Unknown.  Fam hx of arthritis  and new   saw    2 days of feeling much better .     Past Medical History:  Diagnosis Date   Anxiety    Arthritis    R knee, back, hands    Cellulitis of knee 04/2016   RT KNEE    Cellulitis of right knee 04/18/2016   Childhood asthma    Complication of anesthesia    woke up slowly - 36yr. ago   Depression    related to husband illness and death   History of jaundice    as a child     Hx of varicella    Hyperthyroidism    during pregnancy, treated & resolved post partum    IBS (irritable bowel syndrome)    LBBB (left bundle branch block)    Scoliosis    Scoliosis 07/2015   Thyroid disease in pregnancy    TOBACCO DEPENDENCE 07/06/2006   Qualifier: Diagnosis of  By: OSamara Snide    Troponin level elevated     Family History  Problem Relation Age of Onset   Alcohol abuse Mother        died from stomach ulcers   Stroke Father        died  from cva and mi   Hypertension Father    Hyperlipidemia Father    Coronary artery disease Father        Age 42106  Colon polyps Sister    Hepatitis C Brother    Colon cancer Neg Hx     Social History   Socioeconomic History   Marital status:  Widowed    Spouse name: Not on file   Number of children: 3   Years of education: Not on file   Highest education level: Not on file  Occupational History   Occupation: self    Comment: Catering and cleaning  Tobacco Use   Smoking status: Former    Packs/day: 0.25    Years: 40.00    Pack years: 10.00    Types: Cigarettes    Quit date: 01/06/2016    Years since quitting: 5.4   Smokeless tobacco: Never   Tobacco comments:    none in about 1 week  Substance and Sexual Activity   Alcohol use: Yes    Alcohol/week: 0.0 standard drinks    Comment: socially-wine- daily   Drug use: No   Sexual activity: Not on file  Other Topics Concern   Not on file  Social History Narrative   Husband passed away on March 11, 2008 hep c and cirrhosis.   She is hep c negative 2006   G3P3   HH of 2-3  son no pets    No falls .  Has smoke detector and wears seat belts. POsitive  firearms. No excess sun exposure.    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   Had time  of nono insuranced and cannot affort 800 per month for catastrophic    Stopped tobacco fall 2017 . ocass etoh.  No rec drugs.   Daughter patient in our practice   Sheppard Coil   Social Determinants of Health   Financial Resource Strain: Not on file  Food Insecurity: Not on file  Transportation Needs: Not on file  Physical Activity: Not on file  Stress: Not on file  Social Connections: Not on file    Outpatient Medications Prior to Visit  Medication Sig Dispense Refill   albuterol (VENTOLIN HFA) 108 (90 Base) MCG/ACT inhaler Inhale 2 puffs into the lungs every 6 (six) hours as needed for wheezing or shortness of breath. 8.5 g 1   ALPRAZolam (XANAX) 0.5 MG tablet TAKE 1 TABLET BY MOUTH THREE TIMES A DAY AS NEEDED FOR ANXIETY. MAXIMUM 3 TALBETS PER 24 HOURS 30 tablet 0   Calcium Carb-Cholecalciferol (CALCIUM+D3) 600-800 MG-UNIT TABS Take 1 tablet by mouth daily with breakfast.     citalopram (CELEXA) 20 MG tablet TAKE ONE TABLET BY MOUTH DAILY 90 tablet 1   diphenhydrAMINE (BENADRYL) 25 MG tablet Take 25 mg by mouth at bedtime as needed for allergies or sleep.      metoprolol tartrate (LOPRESSOR) 25 MG tablet Take 1 tablet (25 mg total) by mouth 2 (two) times daily. 180 tablet 3   Multiple Vitamins-Minerals (ALIVE ONCE DAILY WOMENS 50+) TABS Take 1 tablet by mouth daily.     naproxen sodium (ANAPROX) 220 MG tablet Take 440 mg by mouth every morning.      Probiotic Product (TRUBIOTICS) CAPS Take 1 capsule by mouth daily.     rosuvastatin (CRESTOR) 5 MG tablet Take 1 tablet (5 mg total) by mouth daily. 90 tablet 3   metroNIDAZOLE (METROGEL VAGINAL) 0.75 % vaginal gel Place 1 Applicatorful vaginally 2 (two) times daily. 70 g 0   nystatin (MYCOSTATIN/NYSTOP) powder Apply 1 application topically 3 (three) times daily. 120 g 1   nystatin-triamcinolone (MYCOLOG II) cream Apply 1 application topically 2 (two) times daily as needed. 45 g 0   No facility-administered medications prior to visit.      EXAM:  BP 108/60 (BP Location:  Left Arm, Patient Position: Sitting, Cuff Size: Normal)    Pulse 60    Temp 98.1 F (36.7 C) (Oral)    Ht _0  (1.549 m)    Wt 156 lb 6.4 oz (70.9 kg)    SpO2 97%    BMI 29.55 kg/m   Body mass index is 29.55 kg/m.  GENERAL: vitals reviewed and listed above, alert, oriented, appears well hydrated and in no acute distress HEENT: atraumatic, conjunctiva  clear, no obvious abnormalities on inspection of external nose and ears OP : Masked NECK: no obvious masses on inspection palpation very tight trapezius right negative midline tenderness obvious LUNGS: clear to auscultation bilaterally, no wheezes, rales or rhonchi, good air movement CV: HRRR, no clubbing cyanosis or  peripheral edema nl cap refill  MS: moves all extremities right upper extremity normal range of motion but difficulty with raising above head and internal rotation with some discomfort.  With trapezius spasm.  Gait seems normal with no obvious weakness lower extremities. Back points to lumbosacral area as area of discomfort.  And pain. PSYCH: pleasant and cooperative, no obvious depression or anxiety Lab Results  Component Value Date   WBC 4.8 08/28/2020   HGB 13.4 08/28/2020   HCT 40.1 08/28/2020   PLT 236.0 08/28/2020   GLUCOSE 86 08/28/2020   CHOL 150 08/28/2020   TRIG 115.0 08/28/2020   HDL 71.10 08/28/2020   LDLCALC 56 08/28/2020   ALT 24 08/28/2020   AST 25 08/28/2020   NA 141 08/28/2020   K 4.4 08/28/2020   CL 103 08/28/2020   CREATININE 0.65 08/28/2020   BUN 20 08/28/2020   CO2 30 08/28/2020   TSH 0.77 08/28/2020   HGBA1C 5.5 08/28/2020   BP Readings from Last 3 Encounters:  06/22/21 108/60  10/06/20 122/70  08/26/20 (!) 146/76    ASSESSMENT AND PLAN:  Discussed the following assessment and plan:  Arthritis of back - Plan: Basic metabolic panel, CBC with Differential/Platelet, Hemoglobin A1c, Hepatic function panel, Lipid panel, TSH, T4, free, C-reactive  protein, Sedimentation rate, Rheumatoid Factor, Cyclic citrul peptide antibody, IgG, HLA-B27 antigen, ANA, ANA, FXT-K24 antigen, Cyclic citrul peptide antibody, IgG, Rheumatoid Factor, Sedimentation rate, C-reactive protein, T4, free, TSH, Lipid panel, Hepatic function panel, Hemoglobin A1c, CBC with Differential/Platelet, Basic metabolic panel  Chronic right shoulder pain - Plan: Basic metabolic panel, CBC with Differential/Platelet, Hemoglobin A1c, Hepatic function panel, Lipid panel, TSH, T4, free, C-reactive protein, Sedimentation rate, Rheumatoid Factor, Cyclic citrul peptide antibody, IgG, HLA-B27 antigen, ANA, ANA, OXB-D53 antigen, Cyclic citrul peptide antibody, IgG, Rheumatoid Factor, Sedimentation rate, C-reactive protein, T4, free, TSH, Lipid panel, Hepatic function panel, Hemoglobin A1c, CBC with Differential/Platelet, Basic metabolic panel  Medication management - Plan: Basic metabolic panel, CBC with Differential/Platelet, Hemoglobin A1c, Hepatic function panel, Lipid panel, TSH, T4, free, C-reactive protein, Sedimentation rate, Rheumatoid Factor, Cyclic citrul peptide antibody, IgG, HLA-B27 antigen, GDJ-M42 antigen, Cyclic citrul peptide antibody, IgG, Rheumatoid Factor, Sedimentation rate, C-reactive protein, T4, free, TSH, Lipid panel, Hepatic function panel, Hemoglobin A1c, CBC with Differential/Platelet, Basic metabolic panel  Hyperlipidemia, unspecified hyperlipidemia type - Plan: Basic metabolic panel, CBC with Differential/Platelet, Hemoglobin A1c, Hepatic function panel, Lipid panel, TSH, T4, free, C-reactive protein, Sedimentation rate, Rheumatoid Factor, Cyclic citrul peptide antibody, IgG, HLA-B27 antigen, AST-M19 antigen, Cyclic citrul peptide antibody, IgG, Rheumatoid Factor, Sedimentation rate, C-reactive protein, T4, free, TSH, Lipid panel, Hepatic function panel, Hemoglobin A1c, CBC with Differential/Platelet, Basic metabolic panel  Pruritic intertrigo - will refill nysatstin  powder  and  mycilog  to use with caution.  - Plan: nystatin (MYCOSTATIN/NYSTOP) powder, nystatin-triamcinolone (MYCOLOG II) cream I doubt that the Crestor is causing her discomfort and pain based on history however can do a trial off and if gets dramatic improvement rechallenge.  I suspect underlying problem is arthritis although could have degenerative osteoarthritis and possible inflammatory arthritis also. Needs a new look since its been a few years.  Suggest she see Dr. Jess Barters office about her shoulder neck Consider seeing rheumatology full set of labs today I suggest some type of reimaging although she may be reluctant to have an MRI.  I will have our pharmacist give opinion about what she was told about permanent damage from a statin medicine.  In her situation I have not seen that or it is rare.  Benefit should be much more than risk based on her history of nstemi She will keep her upcoming CPX appointment in April. -Patient advised to return or notify health care team  if  new concerns arise.  Patient Instructions  Consider seeing a spine specialist .and or rheumatologist   .  Or at least .  Updated imaging.   Will decide after lab back  and review .  UPdated lab today to check for inflammation  . Consider  local care of the right shoulder pain     Can try off crestor for  a few weeks and then go back on to see  how  changes sx  but doesn't seem like   problem is from medication at this time.  Also will ask   [facility] pharmacist  reach out  about med and side effect potential.    Mariann Laster K. Raylie Maddison M.D.

## 2021-06-23 LAB — C-REACTIVE PROTEIN: CRP: <1 mg/dL (ref 0.5–20.0)

## 2021-06-23 LAB — HEPATIC FUNCTION PANEL
ALT: 27 U/L (ref 0–35)
AST: 27 U/L (ref 0–37)
Albumin: 4.2 g/dL (ref 3.5–5.2)
Alkaline Phosphatase: 60 U/L (ref 39–117)
Bilirubin, Direct: 0.1 mg/dL (ref 0.0–0.3)
Total Bilirubin: 0.5 mg/dL (ref 0.2–1.2)
Total Protein: 6.7 g/dL (ref 6.0–8.3)

## 2021-06-23 LAB — LIPID PANEL
Cholesterol: 194 mg/dL (ref 0–200)
HDL: 80 mg/dL (ref >39.00)
LDL Cholesterol: 75 mg/dL (ref 0–99)
NonHDL: 114.23
Total CHOL/HDL Ratio: 2
Triglycerides: 198 mg/dL — ABNORMAL HIGH (ref 0.0–149.0)
VLDL: 39.6 mg/dL (ref 0.0–40.0)

## 2021-06-23 LAB — BASIC METABOLIC PANEL
BUN: 22 mg/dL (ref 6–23)
CO2: 28 mEq/L (ref 19–32)
Calcium: 9.8 mg/dL (ref 8.4–10.5)
Chloride: 104 mEq/L (ref 96–112)
Creatinine, Ser: 0.79 mg/dL (ref 0.40–1.20)
GFR: 78.24 mL/min (ref >60.00)
Glucose, Bld: 83 mg/dL (ref 70–99)
Potassium: 4.3 mEq/L (ref 3.5–5.1)
Sodium: 141 mEq/L (ref 135–145)

## 2021-06-24 LAB — ANA: Anti Nuclear Antibody (ANA): NEGATIVE

## 2021-06-24 LAB — RHEUMATOID FACTOR: Rheumatoid fact SerPl-aCnc: <14 IU/mL (ref <14)

## 2021-06-24 LAB — HLA-B27 ANTIGEN: HLA-B27 Antigen: NEGATIVE

## 2021-06-24 LAB — CYCLIC CITRUL PEPTIDE ANTIBODY, IGG: Cyclic Citrullin Peptide Ab: <16 UNITS

## 2021-06-29 ENCOUNTER — Telehealth: Payer: Self-pay | Admitting: Internal Medicine

## 2021-06-29 NOTE — Telephone Encounter (Signed)
Pt called in regards to lab results on 02/14. I informed pt that PCP has not yet reviewed results and someone will be giving her a call once results are finalized.

## 2021-06-29 NOTE — Telephone Encounter (Signed)
Patient is requesting a phone call back from someone. The call is questions she have from her last visit with Dr.Panosh.  Please advise.

## 2021-07-02 ENCOUNTER — Ambulatory Visit (INDEPENDENT_AMBULATORY_CARE_PROVIDER_SITE_OTHER): Payer: PPO | Admitting: Family

## 2021-07-02 ENCOUNTER — Encounter: Payer: Self-pay | Admitting: Family

## 2021-07-02 DIAGNOSIS — G8929 Other chronic pain: Secondary | ICD-10-CM | POA: Diagnosis not present

## 2021-07-02 DIAGNOSIS — M5412 Radiculopathy, cervical region: Secondary | ICD-10-CM

## 2021-07-02 DIAGNOSIS — M542 Cervicalgia: Secondary | ICD-10-CM

## 2021-07-02 MED ORDER — PREDNISONE 50 MG PO TABS: ORAL_TABLET | ORAL | 0 refills | Status: DC

## 2021-07-02 NOTE — Progress Notes (Signed)
Office Visit Note   Patient: Valerie Love           Date of Birth: 30-Jun-1955           MRN: [id number] Visit Date: 07/02/2021              Requested by: Burnis Medin, MD [place],   [id number] PCP: Burnis Medin, MD  Chief Complaint  Patient presents with   Neck - Pain   Lower Back - Pain      HPI: The patient is a 66 year old woman who presents today complaining of neck and upper extremity pain.  With some low back pain as well she states this is the same issue that she has been dealing with since January of last year.  She has used prednisone in the past without relief she is also completed a course of physical therapy without much improvement.  She is now complaining of pain in bilateral upper arms there is associated weakness and dull aching tooth ache type pain down bilateral upper arms  Assessment & Plan: Visit Diagnoses: No diagnosis found.  Plan: We will send for MRI of the cervical spine as well as referral to Dr. Ernestina Patches for consideration of epidural steroid injection.  Discussed possibility of using gabapentin.  She will use another course of prednisone if she does not get any relief with the prednisone then we may begin her on gabapentin.  Follow-Up Instructions: Return in about 4 weeks (around 07/30/2021), or if symptoms worsen or fail to improve.   Back Exam   Tenderness  The patient is experiencing tenderness in the cervical (paraspinal tenderness).  Range of Motion  The patient has normal back ROM.  Other  Gait: normal   Comments:  + spurling, pain with neck extension   Right Hand Exam   Muscle Strength  Grip: 5/5    Left Hand Exam   Muscle Strength  Grip:  5/5      Patient is alert, oriented, no adenopathy, well-dressed, normal affect, normal respiratory effort.   Imaging: No results found. No images are attached to the encounter.  Labs: Lab Results  Component Value Date   HGBA1C 5.6 06/22/2021    HGBA1C 5.5 08/28/2020   HGBA1C 5.6 06/10/2019   ESRSEDRATE 10 06/22/2021   ESRSEDRATE 17 04/18/2016   ESRSEDRATE 9 08/18/2010   CRP <1.0 06/22/2021   CRP 0.8 04/18/2016   GRAMSTAIN No WBC Seen 04/18/2016   GRAMSTAIN No Squamous Epithelial Cells Seen 04/18/2016   GRAMSTAIN No Organisms Seen 04/18/2016   LABORGA Few GROUP B STREP (S.AGALACTIAE) ISOLATED 04/18/2016     Lab Results  Component Value Date   ALBUMIN 4.2 06/22/2021   ALBUMIN 3.9 08/28/2020   ALBUMIN 4.2 08/08/2019    Lab Results  Component Value Date   MG 2.3 11/12/2018   No results found for: VD25OH  No results found for: PREALBUMIN CBC EXTENDED Latest Ref Rng & Units 06/22/2021 08/28/2020 06/10/2019  WBC 4.0 - 10.5 K/uL 5.4 4.8 7.2  RBC 3.87 - 5.11 Mil/uL 4.54 4.44 4.76  HGB 12.0 - 15.0 g/dL 13.5 13.4 14.4  HCT 36.0 - 46.0 % 41.2 40.1 43.2  PLT 150.0 - 400.0 K/uL 261.0 236.0 294.0  NEUTROABS 1.4 - 7.7 K/uL 3.1 2.7 3.7  LYMPHSABS 0.7 - 4.0 K/uL 1.6 1.6 2.7     There is no height or weight on file to calculate BMI.  Orders:  No orders of the defined types were placed  in this encounter.  No orders of the defined types were placed in this encounter.    Procedures: No procedures performed  Clinical Data: No additional findings.  ROS:  All other systems negative, except as noted in the HPI. Review of Systems  Objective: Vital Signs: There were no vitals taken for this visit.  Specialty Comments:  No specialty comments available.  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   Unilateral primary osteoarthritis, right knee    Special screening for malignant neoplasms, colon 12/29/2014   Diarrhea 12/29/2014   Medication management 05/13/2013   Does not  have health insurance 05/13/2013   Adjustment reaction with anxiety and depression 03/12/2012   Medication monitoring encounter 03/12/2012   Hemorrhoids 08/13/2011   Perineal itching, female 08/13/2011   LBBB (left bundle branch block) 09/24/2010   Murmur 09/08/2010   EKG, abnormal 08/19/2010   Visit for preventive health examination 08/19/2010   Palpitations 08/18/2010   Dizziness 08/18/2010   Arthritis 08/18/2010   Anxiety state 06/18/2008   GRIEF REACTION 06/18/2008   DEPRESSION, MAJOR, RECURRENT 07/06/2006   ARTHRALGIA, UNSPECIFIED 07/06/2006   Past Medical History:  Diagnosis Date   Anxiety    Arthritis    R knee, back, hands    Cellulitis of knee 04/2016   RT KNEE   Cellulitis of right knee 04/18/2016   Childhood asthma    Complication of anesthesia    woke up slowly - 72yrs. ago   Depression    related to husband illness and death   History of jaundice    as a child     Hx of varicella    Hyperthyroidism    during pregnancy, treated & resolved post partum    IBS (irritable bowel syndrome)    LBBB (left bundle branch block)    Scoliosis    Scoliosis 07/2015   Thyroid disease in pregnancy    TOBACCO DEPENDENCE 07/06/2006   Qualifier: Diagnosis of  By: Samara Snide     Troponin level elevated     Family History  Problem Relation Age of Onset   Alcohol abuse Mother        died from stomach ulcers   Stroke Father        died  from cva and mi   Hypertension Father    Hyperlipidemia Father    Coronary artery disease Father        Age 71   Colon polyps Sister    Hepatitis C Brother    Colon cancer Neg Hx     Past Surgical History:  Procedure Laterality Date   BREAST BIOPSY Right 1979   benign    Cyst removed from ovary     KNEE SURGERY     right duda    LEFT HEART CATH AND CORONARY ANGIOGRAPHY N/A 10/31/2018   Procedure: LEFT HEART CATH AND CORONARY ANGIOGRAPHY;  Surgeon: Troy Sine, MD;  Location: [facility];  Service: Cardiovascular;   Laterality: N/A;   TONSILLECTOMY AND ADENOIDECTOMY  1073   TOTAL KNEE ARTHROPLASTY 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   Procedure: TOTAL KNEE ARTHROPLASTY;  Surgeon: Newt Minion, MD;  Location: [facility];  Service: Orthopedics;  Laterality: Right;   TUBAL LIGATION     WISDOM TOOTH EXTRACTION  Social History   Occupational History   Occupation: self    CommentDentist  Tobacco Use   Smoking status: Former    Packs/day: 0.25    Years: 40.00    Pack years: 10.00    Types: Cigarettes    Quit date: 01/06/2016    Years since quitting: 5.4   Smokeless tobacco: Never   Tobacco comments:    none in about 1 week  Substance and Sexual Activity   Alcohol use: Yes    Alcohol/week: 0.0 standard drinks    Comment: socially-wine- daily   Drug use: No   Sexual activity: Not on file

## 2021-07-04 NOTE — Progress Notes (Signed)
Blood results are pretty good.   No elevation of inflammation markers CW autoimmune or rheumatid inflammation.  No anemia  thyroid kidney function normal . Will review  again at  your upcoming  cpx

## 2021-07-07 MED ORDER — GABAPENTIN 100 MG PO CAPS: 100.0000 mg | ORAL_CAPSULE | Freq: Three times a day (TID) | ORAL | 0 refills | Status: DC

## 2021-07-15 ENCOUNTER — Other Ambulatory Visit: Payer: Self-pay

## 2021-07-15 ENCOUNTER — Ambulatory Visit
Admission: RE | Admit: 2021-07-15 | Discharge: 2021-07-15 | Disposition: A | Discharge status: Home / Self Care | Payer: PPO | Source: Ambulatory Visit | Attending: Family | Admitting: Family

## 2021-07-15 DIAGNOSIS — M2578 Osteophyte, vertebrae: Secondary | ICD-10-CM | POA: Diagnosis not present

## 2021-07-15 DIAGNOSIS — M47812 Spondylosis without myelopathy or radiculopathy, cervical region: Secondary | ICD-10-CM | POA: Diagnosis not present

## 2021-07-15 DIAGNOSIS — M4802 Spinal stenosis, cervical region: Secondary | ICD-10-CM | POA: Diagnosis not present

## 2021-07-15 DIAGNOSIS — M4312 Spondylolisthesis, cervical region: Secondary | ICD-10-CM | POA: Diagnosis not present

## 2021-07-15 DIAGNOSIS — M5412 Radiculopathy, cervical region: Secondary | ICD-10-CM

## 2021-07-22 ENCOUNTER — Ambulatory Visit (INDEPENDENT_AMBULATORY_CARE_PROVIDER_SITE_OTHER): Payer: PPO | Admitting: Orthopedic Surgery

## 2021-07-22 DIAGNOSIS — M5412 Radiculopathy, cervical region: Secondary | ICD-10-CM | POA: Diagnosis not present

## 2021-07-23 ENCOUNTER — Telehealth: Payer: Self-pay

## 2021-07-23 NOTE — Telephone Encounter (Signed)
Patient called returning a call from shena  ?

## 2021-07-25 ENCOUNTER — Encounter: Payer: Self-pay | Admitting: Orthopedic Surgery

## 2021-07-25 NOTE — Progress Notes (Signed)
? ?Office Visit Note ?  ?Patient: Valerie Love           ?Date of Birth: 01/22/1956           ?MRN: [id number] ?Visit Date: 07/22/2021 ?             ?Requested by: Madelin Headings, MD ?[phone] ?[place],  [place] [address] ?PCP: Madelin Headings, MD ? ?Chief Complaint  ?Patient presents with  ? Neck - Follow-up  ?  MRI Cspine  ? ? ? ? ?HPI: ?Patient is a 66 year old woman who presents in follow-up for MRI scan cervical spine.  She states she still has right upper extremity radicular pain.  She states she has weakness when picking up a water pitcher. ? ?Assessment & Plan: ?Visit Diagnoses:  ?1. Radiculopathy, cervical region   ? ? ?Plan: We will make a referral to Dr. Alvester Morin for evaluation for epidural steroid injection. ? ?Follow-Up Instructions: Return if symptoms worsen or fail to improve.  ? ?Ortho Exam ? ?Patient is alert, oriented, no adenopathy, well-dressed, normal affect, normal respiratory effort. ?Examination patient has motor strength 5/5 in all motor groups of both upper extremities no focal weakness to examination.  Review of the MRI scan shows degenerative disc disease at C5-6 worse on the right than the left.  There is also disc disease at C4-5 C3-4 and C6-7. ? ?Imaging: ?No results found. ?No images are attached to the encounter. ? ?Labs: ?Lab Results  ?Component Value Date  ? HGBA1C 5.6 06/22/2021  ? HGBA1C 5.5 08/28/2020  ? HGBA1C 5.6 06/10/2019  ? ESRSEDRATE 10 06/22/2021  ? ESRSEDRATE 17 04/18/2016  ? ESRSEDRATE 9 08/18/2010  ? CRP <1.0 06/22/2021  ? CRP 0.8 04/18/2016  ? GRAMSTAIN No WBC Seen 04/18/2016  ? GRAMSTAIN No Squamous Epithelial Cells Seen 04/18/2016  ? GRAMSTAIN No Organisms Seen 04/18/2016  ? LABORGA Few GROUP B STREP (S.AGALACTIAE) ISOLATED 04/18/2016  ? ? ? ?Lab Results  ?Component Value Date  ? ALBUMIN 4.2 06/22/2021  ? ALBUMIN 3.9 08/28/2020  ? ALBUMIN 4.2 08/08/2019  ? ? ?Lab Results  ?Component Value Date  ? MG 2.3 11/12/2018  ? ?No results found for: VD25OH ? ?No  results found for: PREALBUMIN ?CBC EXTENDED Latest Ref Rng & Units 06/22/2021 08/28/2020 06/10/2019  ?WBC 4.0 - 10.5 K/uL 5.4 4.8 7.2  ?RBC 3.87 - 5.11 Mil/uL 4.54 4.44 4.76  ?HGB 12.0 - 15.0 g/dL 58.5 27.7 82.4  ?HCT 36.0 - 46.0 % 41.2 40.1 43.2  ?PLT 150.0 - 400.0 K/uL 261.0 236.0 294.0  ?NEUTROABS 1.4 - 7.7 K/uL 3.1 2.7 3.7  ?LYMPHSABS 0.7 - 4.0 K/uL 1.6 1.6 2.7  ? ? ? ?There is no height or weight on file to calculate BMI. ? ?Orders:  ?Orders Placed This Encounter  ?Procedures  ? Ambulatory referral to Physical Medicine Rehab  ? ?No orders of the defined types were placed in this encounter. ? ? ? Procedures: ?No procedures performed ? ?Clinical Data: ?No additional findings. ? ?ROS: ? ?All other systems negative, except as noted in the HPI. ?Review of Systems ? ?Objective: ?Vital Signs: There were no vitals taken for this visit. ? ?Specialty Comments:  ?No specialty comments available. ? ?PMFS History: ?Patient Active Problem List  ? Diagnosis Date Noted  ? Acute systolic heart failure (HCC) 11/01/2018  ? NSTEMI (non-ST elevated myocardial infarction) (HCC) 10/30/2018  ? Hyperlipidemia 06/13/2018  ? Lactose intolerance 06/13/2018  ? S/P total knee arthroplasty, right 03/16/2016  ? Unilateral  primary osteoarthritis, right knee   ? Special screening for malignant neoplasms, colon 12/29/2014  ? Diarrhea 12/29/2014  ? Medication management 05/13/2013  ? Does not have health insurance 05/13/2013  ? Adjustment reaction with anxiety and depression 03/12/2012  ? Medication monitoring encounter 03/12/2012  ? Hemorrhoids 08/13/2011  ? Perineal itching, female 08/13/2011  ? LBBB (left bundle branch block) 09/24/2010  ? Murmur 09/08/2010  ? EKG, abnormal 08/19/2010  ? Visit for preventive health examination 08/19/2010  ? Palpitations 08/18/2010  ? Dizziness 08/18/2010  ? Arthritis 08/18/2010  ? Anxiety state 06/18/2008  ? GRIEF REACTION 06/18/2008  ? DEPRESSION, MAJOR, RECURRENT 07/06/2006  ? ARTHRALGIA, UNSPECIFIED 07/06/2006   ? ?Past Medical History:  ?Diagnosis Date  ? Anxiety   ? Arthritis   ? R knee, back, hands   ? Cellulitis of knee 04/2016  ? RT KNEE  ? Cellulitis of right knee 04/18/2016  ? Childhood asthma   ? Complication of anesthesia   ? woke up slowly - 68yrs. ago  ? Depression   ? related to husband illness and death  ? History of jaundice   ? as a child    ? Hx of varicella   ? Hyperthyroidism   ? during pregnancy, treated & resolved post partum   ? IBS (irritable bowel syndrome)   ? LBBB (left bundle branch block)   ? Scoliosis   ? Scoliosis 07/2015  ? Thyroid disease in pregnancy   ? TOBACCO DEPENDENCE 07/06/2006  ? Qualifier: Diagnosis of  By: Knox Royalty    ? Troponin level elevated   ?  ?Family History  ?Problem Relation Age of Onset  ? Alcohol abuse Mother   ?     died from stomach ulcers  ? Stroke Father   ?     died  from cva and mi  ? Hypertension Father   ? Hyperlipidemia Father   ? Coronary artery disease Father   ?     Age 73  ? Colon polyps Sister   ? Hepatitis C Brother   ? Colon cancer Neg Hx   ?  ?Past Surgical History:  ?Procedure Laterality Date  ? BREAST BIOPSY Right 1979  ? benign   ? Cyst removed from ovary    ? KNEE SURGERY    ? right Teira Arcilla   ? LEFT HEART CATH AND CORONARY ANGIOGRAPHY N/A 10/31/2018  ? Procedure: LEFT HEART CATH AND CORONARY ANGIOGRAPHY;  Surgeon: Lennette Bihari, MD;  Location: [facility] INVASIVE CV LAB;  Service: Cardiovascular;  Laterality: N/A;  ? TONSILLECTOMY AND ADENOIDECTOMY  1073  ? TOTAL KNEE ARTHROPLASTY 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  ? Procedure: TOTAL KNEE ARTHROPLASTY;  Surgeon: [name], MD;  Location: [facility] OR;  Service: Orthopedics;  Laterality: Right;  ? TUBAL LIGATION    ? WISDOM TOOTH EXTRACTION    ? ?Social History  ? ?Occupational History  ? 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 Smoking status: Former  ?  Packs/day: 0.25  ?  Years: 40.00  ?  Pack years: 10.00  ?  Types: Cigarettes  ?  Quit date: 01/06/2016  ?  Years since quitting: 5.5  ? Smokeless  tobacco: Never  ? Tobacco comments:  ?  none in about 1 week  ?Substance and Sexual Activity  ? Alcohol use: Yes  ?  Alcohol/week: 0.0 standard drinks  ?  Comment: socially-wine- daily  ? Drug use: No  ? Sexual activity: Not on file  ? ? ? ? ? ?

## 2021-07-26 NOTE — Telephone Encounter (Signed)
error 

## 2021-07-30 ENCOUNTER — Other Ambulatory Visit: Payer: Self-pay

## 2021-07-30 ENCOUNTER — Ambulatory Visit: Payer: PPO | Admitting: Physical Medicine and Rehabilitation

## 2021-07-30 ENCOUNTER — Encounter: Payer: Self-pay | Admitting: Physical Medicine and Rehabilitation

## 2021-07-30 VITALS — BP 149/86 | HR 67

## 2021-07-30 DIAGNOSIS — M7918 Myalgia, other site: Secondary | ICD-10-CM | POA: Diagnosis not present

## 2021-07-30 DIAGNOSIS — M47812 Spondylosis without myelopathy or radiculopathy, cervical region: Secondary | ICD-10-CM

## 2021-07-30 DIAGNOSIS — M542 Cervicalgia: Secondary | ICD-10-CM

## 2021-07-30 DIAGNOSIS — M5412 Radiculopathy, cervical region: Secondary | ICD-10-CM | POA: Diagnosis not present

## 2021-07-30 NOTE — Progress Notes (Signed)
Pt state lower back pain. Pt state any movement makes the pain worse. 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 Pt state takes over the counter pain meds to help ease her pain.  ? ?Numeric Pain Rating Scale and Functional Assessment ?Average Pain 10 ?Pain Right Now 7 ?My pain is constant, sharp, burning, dull, stabbing, and aching ?Pain is worse with: walking, bending, sitting, standing, and some activites ?Pain improves with: medication ? ? ?In the last MONTH (on 0-10 scale) has pain interfered with the following? ? ?1. General activity like being  able to carry out your everyday physical activities such as walking, climbing stairs, carrying groceries, or moving a chair?  ?Rating(6) ? ?2. Relation with others like being able to carry out your usual social activities and roles such as  activities at home, at work and in your community. ?Rating(7) ? ?3. Enjoyment of life such that you have  been bothered by emotional problems such as feeling anxious, depressed or irritable?  ?Rating(8) ? ?

## 2021-07-30 NOTE — Progress Notes (Signed)
? ?Lourie Retz - 66 y.o. female MRN [id number]  Date of birth: [birth year] ? ?Office Visit Note: ?Visit Date: 07/30/2021 ?PCP: Madelin Headings, MD ?Referred by: Madelin Headings, MD ? ?Subjective: ?Chief Complaint  ?Patient presents with  ? Lower Back - Pain  ? ?HPI: Tijah Hane is a 66 y.o. female who comes in today at the request of Dr. Aldean Baker for evaluation of chronic, worsening and severe bilateral neck pain radiating to both shoulders and down to bilateral elbows, right greater than left. Patient reports pain has been ongoing intermittently for 2-3 years and is exacerbated by movement and activity.  Patient describes pain as a constant sharp, sore and tight sensation, currently rates as 8 out of 10.  Patient reports some relief of pain with home exercise regimen, heating pad, hot showers and use of over-the-counter medications as needed.  Patient did attend formal physical therapy in 2021 with our in-house team and reports some relief of pain with these treatments.  Patient's recent cervical MRI exhibits grade 1 anterolisthesis at C2-C3 and C3-C4, multilevel facet hypertrophy, disc bulge with bilateral disc osteophyte ridge/uncinate hypertrophy that contacts and minimally flattens the ventral spinal cord at C4-C5 and C5-C6. No high grade spinal canal stenosis noted. Patient states her pain is negatively affecting her daily life. Patient denies focal weakness, numbness and tingling. Patient denies recent trauma or falls.  ? ? ? ? ?Review of Systems  ?Musculoskeletal:  Positive for myalgias and neck pain.  ?Neurological:  Negative for tingling, sensory change, focal weakness and weakness.  ?All other systems reviewed and are negative. Otherwise per HPI. ? ?Assessment & Plan: ?Visit Diagnoses:  ?  ICD-10-CM   ?1. Radiculopathy, cervical region  M54.12 Ambulatory referral to Physical Medicine Rehab  ?  Ambulatory referral to Physical Therapy  ?  ?2. Facet hypertrophy of cervical region  M47.812   ?   ?3. Cervicalgia  M54.2 Ambulatory referral to Physical Medicine Rehab  ?  Ambulatory referral to Physical Therapy  ?  ?4. Myofascial pain syndrome  M79.18 Ambulatory referral to Physical Medicine Rehab  ?  Ambulatory referral to Physical Therapy  ?  ?   ?Plan: Findings:  ?Chronic, worsening and severe bilateral neck pain radiating to both shoulders and down to bilateral elbows. Patient continues to have significant pain despite good conservative therapies such as formal physical therapy, home exercise regimen, and use of medications. I did discuss patients recent cervical MRI with her today in detail using images and spine model. Patients clinical presentation and exam are consistent with C5/C6 nerve pattern. We also believe there could be a myofascial component contributing to her pain as she does have multiple palpable trigger points to bilateral levator scapulae and trapezius muscles. We believe the next step is to perform a diagnostic and hopefully therapeutic right C7-T1 interlaminar epidural steroid injection under fluoroscopic guidance. Patient is not currently on long term anticoagulant therapy. Patient did voice increased anxiety related to procedure, however I did explain cervical epidural steroid injection procedure to her in detail and is now reassured and would like to move forward. I also spoke with patient about myofascial component and feel that she would benefit from re-grouping with physical therapy, I did place an order for PT with our in house team with a recommendation for manual treatments and dry needling. I would like to see patient back 2 weeks after cervical epidural steroid injection for re-evaluation. Patient encouraged to remain active and continue home exercise regimen as  tolerated. No red flag symptoms noted upon exam today.   ? ?Meds & Orders: No orders of the defined types were placed in this encounter. ?  ?Orders Placed This Encounter  ?Procedures  ? Ambulatory referral to Physical  Medicine Rehab  ? Ambulatory referral to Physical Therapy  ?  ?Follow-up: Return for Right C7-T1 interlaminar epidural steroid injection.  ? ?Procedures: ?No procedures performed  ?   ? ?Clinical History: ?EXAM: ?MRI CERVICAL SPINE WITHOUT CONTRAST ?  ?TECHNIQUE: ?Multiplanar, multisequence MR imaging of the cervical spine was ?performed. No intravenous contrast was administered. ?  ?COMPARISON:  Radiographs of the cervical spine 03/16/2020 (images ?available, report unavailable). ?  ?FINDINGS: ?Mild intermittent motion degradation. ?  ?Alignment: Reversal of the expected cervical lordosis. 2 mm C2-C3 ?grade 1 anterolisthesis. 2-3 mm C3-C4 grade 1 anterolisthesis. ?  ?Vertebrae: Vertebral body height is maintained. Mild degenerative ?endplate edema at Y5-O5, C5-C6, C6-C7 and C7-T1. Marrow edema within ?the bilateral posterior elements at C2-C3 and on the left at C3-C4, ?likely degenerative and related to facet arthrosis. ?  ?Cord: No signal abnormality identified within the cervical spinal ?cord ?  ?Posterior Fossa, vertebral arteries, paraspinal tissues: No ?abnormality identified within included portions of the posterior ?fossa. Flow voids preserved within the imaged cervical vertebral ?arteries. Paraspinal soft tissues unremarkable. ?  ?Disc levels: ?  ?Multilevel disc space narrowing, greatest at C4-C5 (moderate), C5-C6 ?(moderate/advanced), C6-C7 (moderate/advanced) and C7-T1 ?(moderate/advanced). ?  ?C2-C3: Grade 1 anterolisthesis. Slight disc uncovering. Facet ?arthrosis on the right. No significant spinal canal or foraminal ?stenosis. ?  ?C3-C4: Grade 1 anterolisthesis. Slight disc uncovering and disc ?bulge. Uncovertebral hypertrophy on the left. Facet arthrosis ?(greater on the left and fairly advanced on the left). No ?significant spinal canal stenosis. Severe left neural foraminal ?narrowing. ?  ?C4-C5: Disc bulge. Endplate spurring with bilateral disc osteophyte ?ridge/uncinate hypertrophy. A disc bulge  effaces the ventral thecal ?sac, contacting and minimally flattening the ventral spinal cord. ?However, the dorsal CSF space is maintained within the spinal canal. ?Bilateral neural foraminal narrowing (mild right, severe left). ?  ?C5-C6: Disc bilateral disc osteophyte ridge/uncinate hypertrophy. ?Mild-to-moderate spinal canal stenosis. The disc bulge contacts and ?minimally flattens the ventral aspect of the spinal cord. Bilateral ?neural foraminal narrowing (severe right, mild left). ?  ?C6-C7: Disc bulge. Bilateral disc osteophyte ridge/uncinate ?hypertrophy. Mild spinal canal stenosis (without significant spinal ?cord mass effect). Severe bilateral neural foraminal narrowing. ?  ?C7-T1: Disc bulge. Right-sided disc osteophyte ridge. Facet ?arthrosis. No significant spinal canal stenosis. Mild right neural ?foraminal narrowing. ?  ?IMPRESSION: ?Cervical spondylosis, as outlined and with findings most notably as ?follows. ?  ?At C5-C6, there is moderate/advanced disc degeneration. Disc bulge. ?Bilateral disc osteophyte ridge/uncinate hypertrophy. Mild to ?moderate spinal canal narrowing. The disc bulge contacts and ?minimally flattens the ventral aspect of the spinal cord. Bilateral ?neural foraminal narrowing (severe right, mild left). ?  ?At C4-C5, there is moderate disc degeneration. Disc bulge with ?bilateral disc osteophyte ridge/uncinate hypertrophy. The disc bulge ?contacts and minimally flattens the ventral spinal cord. However, ?the dorsal CSF space is maintained within the spinal canal. ?Bilateral neural foraminal narrowing (mild right, severe left). ?  ?No more than mild spinal canal narrowing at the remaining levels. ?Additional sites of foraminal stenosis, as detailed and greatest on ?the left at C3-C4 (severe) and bilaterally at C6-C7 (severe). ?  ?Also of note, disc degeneration is moderate/advanced at C6-C7 and ?C7-T1. ?  ?Degenerative marrow edema as described. ?  ?Nonspecific reversal of  expected  cervical lordosis. Grade 1 ?anterolisthesis at C2-C3 and C3-C4. ?  ?  ?Electronically Signed ?  By: Jackey Loge D.O. ?  On: 07/15/2021 07:41  ? ?She reports that she quit smoking about 5 years ag

## 2021-08-04 ENCOUNTER — Encounter: Payer: Self-pay | Admitting: Rehabilitative and Restorative Service Providers"

## 2021-08-04 ENCOUNTER — Other Ambulatory Visit: Payer: Self-pay

## 2021-08-04 ENCOUNTER — Ambulatory Visit: Payer: PPO | Admitting: Rehabilitative and Restorative Service Providers"

## 2021-08-04 DIAGNOSIS — M542 Cervicalgia: Secondary | ICD-10-CM

## 2021-08-04 DIAGNOSIS — M79601 Pain in right arm: Secondary | ICD-10-CM | POA: Diagnosis not present

## 2021-08-04 DIAGNOSIS — M6281 Muscle weakness (generalized): Secondary | ICD-10-CM | POA: Diagnosis not present

## 2021-08-04 DIAGNOSIS — R293 Abnormal posture: Secondary | ICD-10-CM | POA: Diagnosis not present

## 2021-08-04 NOTE — Therapy (Signed)
?OUTPATIENT PHYSICAL THERAPY EVALUATION ? ? ?Patient Name: Valerie Love ?MRN: [id number] ?DOB:Mar 07, 1956, 66 y.o., female ?Today's Date: 08/04/2021 ? ? PT End of Session - 08/04/21 0924   ? ? Visit Number 1   ? Number of Visits 20   ? Date for PT Re-Evaluation 10/13/21   ? Authorization Type [employer] $15   ? PT Start Time [phone]   ? PT Stop Time 1010   ? PT Time Calculation (min) 43 min   ? Activity Tolerance Patient tolerated treatment well   ? Behavior During Therapy [facility] for tasks assessed/performed   ? ?  ?  ? ?  ? ? ?Past Medical History:  ?Diagnosis Date  ? Anxiety   ? Arthritis   ? R knee, back, hands   ? Cellulitis of knee 04/2016  ? RT KNEE  ? Cellulitis of right knee 04/18/2016  ? Childhood asthma   ? Complication of anesthesia   ? woke up slowly - 91yrs. ago  ? Depression   ? related to husband illness and death  ? History of jaundice   ? as a child    ? Hx of varicella   ? Hyperthyroidism   ? during pregnancy, treated & resolved post partum   ? IBS (irritable bowel syndrome)   ? LBBB (left bundle branch block)   ? Scoliosis   ? Scoliosis 07/2015  ? Thyroid disease in pregnancy   ? TOBACCO DEPENDENCE 07/06/2006  ? Qualifier: Diagnosis of  By: Knox Royalty    ? Troponin level elevated   ? ?Past Surgical History:  ?Procedure Laterality Date  ? BREAST BIOPSY Right 1979  ? benign   ? Cyst removed from ovary    ? KNEE SURGERY    ? right duda   ? LEFT HEART CATH AND CORONARY ANGIOGRAPHY N/A 10/31/2018  ? Procedure: LEFT HEART CATH AND CORONARY ANGIOGRAPHY;  Surgeon: Lennette Bihari, MD;  Location: [facility] INVASIVE CV LAB;  Service: Cardiovascular;  Laterality: N/A;  ? TONSILLECTOMY AND ADENOIDECTOMY  1073  ? TOTAL KNEE ARTHROPLASTY 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  ? Procedure: TOTAL KNEE ARTHROPLASTY;  Surgeon: [name], MD;  Location: [facility] OR;  Service: Orthopedics;  Laterality: Right;  ? TUBAL LIGATION    ? WISDOM TOOTH EXTRACTION    ? ?Patient Active Problem List  ? Diagnosis Date Noted  ? Acute systolic heart failure (HCC)  10/93/2355  ? NSTEMI (non-ST elevated myocardial infarction) (HCC) 10/30/2018  ? Hyperlipidemia 06/13/2018  ? Lactose intolerance 06/13/2018  ? S/P total knee arthroplasty, right 03/16/2016  ? Unilateral primary osteoarthritis, right knee   ? Special screening for malignant neoplasms, colon 12/29/2014  ? Diarrhea 12/29/2014  ? Medication management 05/13/2013  ? Does not have health insurance 05/13/2013  ? Adjustment reaction with anxiety and depression 03/12/2012  ? Medication monitoring encounter 03/12/2012  ? Hemorrhoids 08/13/2011  ? Perineal itching, female 08/13/2011  ? LBBB (left bundle branch block) 09/24/2010  ? Murmur 09/08/2010  ? EKG, abnormal 08/19/2010  ? Visit for preventive health examination 08/19/2010  ? Palpitations 08/18/2010  ? Dizziness 08/18/2010  ? Arthritis 08/18/2010  ? Anxiety state 06/18/2008  ? GRIEF REACTION 06/18/2008  ? DEPRESSION, MAJOR, RECURRENT 07/06/2006  ? ARTHRALGIA, UNSPECIFIED 07/06/2006  ? ? ?PCP: Madelin Headings, MD ? ?REFERRING PROVIDER: Juanda Chance, NP ? ?REFERRING DIAG: M54.12 (ICD-10-CM) - Radiculopathy, cervical region ?M54.2 (ICD-10-CM) - Cervicalgia ?M79.18 (ICD-10-CM) - Myofascial pain syndrome ? ?THERAPY DIAG:  ?Cervicalgia ? ?Pain in right arm ? ?Muscle weakness (  generalized) ? ?Abnormal posture ? ?ONSET DATE: onset several years with intermittent complaints ? ?SUBJECTIVE:                                                                                                                                                                                                        ? ?SUBJECTIVE STATEMENT: ?Insidious onset of symptoms, intermittent over several years.  Pt indicated history of a lot of arthritis in back and has developed into neck.  Pt indicated initially having tightness in shoulders and neck but has noted progression to include arm symptoms.  Has injection planned in future.  Pt indicated she was referred to therapy for those symptoms as well.  Pt  indicated Rt arm symptoms are noted more than Lt.  Pt indicated having some trouble sleeping on side due to complaints in Rt arm.  ? ?PERTINENT HISTORY:  ?HIstory of scoliosis, hyperthyroidism ? ?PAIN:  ?Are you having pain? Yes: NPRS scale: current: 6/10 , at worst 8/10 ?Pain location: Neck, bilateral UE (Rt> Lt) ?Pain description: tightness, achy ?Aggravating factors: lifting items, reaching above head, carrying items ?Relieving factors: Hot showers, CBD oil helps short term ? ?PRECAUTIONS: None ? ?WEIGHT BEARING RESTRICTIONS No ? ?FALLS:  ?Has patient fallen in last 6 months? No ? ?LIVING ENVIRONMENT: ?Lives in: House/apartment ? ?OCCUPATION: Housecleaning business ? ?PLOF: Independent, work in yard, Rt hand dominant ? ?PATIENT GOALS Reduce pain, be more active ? ?OBJECTIVE:  ? ?DIAGNOSTIC FINDINGS:  ?MRI performed on 07/15/2021 showing grade 1 anterolisthesis at C2-C3 and C3-C4, multilevel facet hypertrophy, disc bulge with bilateral disc osteophyte ridge/uncinate hypertrophy that contacts and minimally flattens the ventral spinal cord at C4-C5 and C5-C6. No high grade spinal canal stenosis noted - per MD office visit note by referring provider  ? ?PATIENT SURVEYS:  ? 08/04/2021:  FOTO intake: 51  predicted  56 ? ? ?COGNITION: ?08/04/2021 Overall cognitive status: Within functional limits for tasks assessed ? ? ?SENSATION: ?08/04/2021 WFL ? ?POSTURE:  ?08/04/2021 : Rounded shoulder, forward head posture ? ?PALPATION: ?08/04/2021 :   Trigger points c concordant pain symptoms noted in bilateral upper trap, levator scap.  Noted Rt upper arm symptoms c trigger points in Rt infraspinatus ? ?CERVICAL ROM:  ? 08/04/2021: no change in arm symptoms c cervical AROM below ?Active ROM AROM (deg) ?08/04/2021  ?Flexion 50  ?Extension 48  ?Right lateral flexion   ?Left lateral flexion   ?Right rotation 75  ?Left rotation 60 c lt neck pain  ? (Blank rows = not tested) ? ?UE ROM: ? ?Active ROM Right ?  08/04/2021 Left ?08/04/2021  ?Shoulder  flexion WFL against gravity WFL against gravity  ?Shoulder extension    ?Shoulder abduction    ?Shoulder adduction    ?Shoulder extension    ?Shoulder internal rotation    ?Shoulder external rotation    ?Elbow flexion    ?Elbow extension    ?Wrist flexion    ?Wrist extension    ?Wrist ulnar deviation    ?Wrist radial deviation    ?Wrist pronation    ?Wrist supination    ? (Blank rows = not tested) ? ?UE MMT: ? ?MMT Right ?08/04/2021 Left ?08/04/2021  ?Shoulder flexion 4/5 c pain 5/5  ?Shoulder extension    ?Shoulder abduction 4/5 c pain 5/5  ?Shoulder adduction    ?Shoulder extension    ?Shoulder internal rotation 5/5 5/5  ?Shoulder external rotation 4/5 5/5  ?Middle trapezius    ?Lower trapezius    ?Elbow flexion 5/5 5/5  ?Elbow extension 5/5 5/5  ?Wrist flexion    ?Wrist extension    ?Wrist ulnar deviation    ?Wrist radial deviation    ?Wrist pronation    ?Wrist supination    ?Grip strength    ? (Blank rows = not tested) ? ?CERVICAL SPECIAL TESTS:  ?08/04/2021  (-) spurlings compression bilateral. (-) radicular complaints c cervical ROM.  ?  (-) Drop arm Lt, painful arc Lt ? ?TODAY'S TREATMENT:  ?08/04/2021 ?  Therex: HEP instruction/performance c cues for techniques, handout provided.  Trial set performed of each for comprehension and symptom assessment.  See below for exercise list. ? ?  Manual:  ?    Compression bilateral upper trap ?   ?  Dry Needling: ?    Bilateral upper trap twitch response noted c localized symptoms. No adverse reaction noted ?  Modalities: ?    Moist heat to bilateral upper trap c education on HEP in therex ? ? ?PATIENT EDUCATION:  ?Education details: HEP, POC, DN ?Person educated: Patient ?Education method: Explanation, Demonstration, Verbal cues, and Handouts ?Education comprehension: verbalized understanding and returned demonstration ? ? ?HOME EXERCISE PROGRAM: ?08/04/2021: ? Access Code: [email or web address] ?URL: [email or web address] ?Date: 08/04/2021 ?Prepared by: Chyrel Masson ? ?Exercises ?- Seated Gentle Upper Trapezius Stretch  - 3-5 x daily - 7 x weekly - 1 sets - 5 reps - 15 hold ?- Shoulder External Rotation and Scapular Retraction with Resistance  - 3-5 x daily - 7 x weekly - 1

## 2021-08-11 ENCOUNTER — Ambulatory Visit (INDEPENDENT_AMBULATORY_CARE_PROVIDER_SITE_OTHER): Payer: PPO

## 2021-08-11 ENCOUNTER — Encounter: Payer: Self-pay | Admitting: Physical Therapy

## 2021-08-11 ENCOUNTER — Ambulatory Visit: Payer: PPO | Admitting: Physical Therapy

## 2021-08-11 VITALS — BP 118/62 | HR 69 | Temp 96.7°F | Ht 61.0 in | Wt 157.6 lb

## 2021-08-11 DIAGNOSIS — M542 Cervicalgia: Secondary | ICD-10-CM | POA: Diagnosis not present

## 2021-08-11 DIAGNOSIS — M6281 Muscle weakness (generalized): Secondary | ICD-10-CM

## 2021-08-11 DIAGNOSIS — Z0000 Encounter for general adult medical examination without abnormal findings: Secondary | ICD-10-CM | POA: Diagnosis not present

## 2021-08-11 DIAGNOSIS — R293 Abnormal posture: Secondary | ICD-10-CM

## 2021-08-11 DIAGNOSIS — M79601 Pain in right arm: Secondary | ICD-10-CM | POA: Diagnosis not present

## 2021-08-11 NOTE — Patient Instructions (Addendum)
?[name] , ?Thank you for taking time to come for your Medicare Wellness Visit. I appreciate your ongoing commitment to your health goals. Please review the following plan we discussed and let me know if I can assist you in the future.  ? ?These are the goals we discussed: ? Goals   ? ?   Increase physical activity (pt-stated)   ?   I would love to start back walking. ?  ? ?  ?  ?This is a list of the screening recommended for you and due dates:  ?Health Maintenance  ?Topic Date Due  ? Pneumonia Vaccine (2 - PCV) 12/20/2021*  ? COVID-19 Vaccine (4 - Booster for Pfizer series) 12/20/2021*  ? Flu Shot  12/07/2021  ? Mammogram  10/01/2022  ? Colon Cancer Screening  01/05/2025  ? Tetanus Vaccine  02/22/2029  ? DEXA scan (bone density measurement)  Completed  ? Hepatitis C Screening: USPSTF Recommendation to screen - Ages 8-79 yo.  Completed  ? Zoster (Shingles) Vaccine  Completed  ? HPV Vaccine  Aged Out  ?*Topic was postponed. The date shown is not the original due date.  ?  ?Advanced directives: Yes Patient will provide copy ? ?Conditions/risks identified: None ? ?Next appointment: Follow up in one year for your annual wellness visit  ? ? ?Preventive Care 11 Years and Older, Female ?Preventive care refers to lifestyle choices and visits with your health care provider that can promote health and wellness. ?What does preventive care include? ?A yearly physical exam. This is also called an annual well check. ?Dental exams once or twice a year. ?Routine eye exams. Ask your health care provider how often you should have your eyes checked. ?Personal lifestyle choices, including: ?Daily care of your teeth and gums. ?Regular physical activity. ?Eating a healthy diet. ?Avoiding tobacco and drug use. ?Limiting alcohol use. ?Practicing safe sex. ?Taking low-dose aspirin every day. ?Taking vitamin and mineral supplements as recommended by your health care provider. ?What happens during an annual well check? ?The services  and screenings done by your health care provider during your annual well check will depend on your age, overall health, lifestyle risk factors, and family history of disease. ?Counseling  ?Your health care provider may ask you questions about your: ?Alcohol use. ?Tobacco use. ?Drug use. ?Emotional well-being. ?Home and relationship well-being. ?Sexual activity. ?Eating habits. ?History of falls. ?Memory and ability to understand (cognition). ?Work and work Astronomer. ?Reproductive health. ?Screening  ?You may have the following tests or measurements: ?Height, weight, and BMI. ?Blood pressure. ?Lipid and cholesterol levels. These may be checked every 5 years, or more frequently if you are over 64 years old. ?Skin check. ?Lung cancer screening. You may have this screening every year starting at age 31 if you have a 30-pack-year history of smoking and currently smoke or have quit within the past 15 years. ?Fecal occult blood test (FOBT) of the stool. You may have this test every year starting at age 10. ?Flexible sigmoidoscopy or colonoscopy. You may have a sigmoidoscopy every 5 years or a colonoscopy every 10 years starting at age 28. ?Hepatitis C blood test. ?Hepatitis B blood test. ?Sexually transmitted disease (STD) testing. ?Diabetes screening. This is done by checking your blood sugar (glucose) after you have not eaten for a while (fasting). You may have this done every 1-3 years. ?Bone density scan. This is done to screen for osteoporosis. You may have this done starting at age 10. ?Mammogram. This may be done every 1-2 years.  Talk to your health care provider about how often you should have regular mammograms. ?Talk with your health care provider about your test results, treatment options, and if necessary, the need for more tests. ?Vaccines  ?Your health care provider may recommend certain vaccines, such as: ?Influenza vaccine. This is recommended every year. ?Tetanus, diphtheria, and acellular pertussis  (Tdap, Td) vaccine. You may need a Td booster every 10 years. ?Zoster vaccine. You may need this after age 42. ?Pneumococcal 13-valent conjugate (PCV13) vaccine. One dose is recommended after age 87. ?Pneumococcal polysaccharide (PPSV23) vaccine. One dose is recommended after age 45. ?Talk to your health care provider about which screenings and vaccines you need and how often you need them. ?This information is not intended to replace advice given to you by your health care provider. Make sure you discuss any questions you have with your health care provider. ?Document Released: 05/22/2015 Document Revised: 01/13/2016 Document Reviewed: 02/24/2015 ?Elsevier Interactive Patient Education ? 2017 [employer]. ? ?Fall Prevention in the Home ?Falls can cause injuries. They can happen to people of all ages. There are many things you can do to make your home safe and to help prevent falls. ?What can I do on the outside of my home? ?Regularly fix the edges of walkways and driveways and fix any cracks. ?Remove anything that might make you trip as you walk through a door, such as a raised step or threshold. ?Trim any bushes or trees on the path to your home. ?Use bright outdoor lighting. ?Clear any walking paths of anything that might make someone trip, such as rocks or tools. ?Regularly check to see if handrails are loose or broken. Make sure that both sides of any steps have handrails. ?Any raised decks and porches should have guardrails on the edges. ?Have any leaves, snow, or ice cleared regularly. ?Use sand or salt on walking paths during winter. ?Clean up any spills in your garage right away. This includes oil or grease spills. ?What can I do in the bathroom? ?Use night lights. ?Install grab bars by the toilet and in the tub and shower. Do not use towel bars as grab bars. ?Use non-skid mats or decals in the tub or shower. ?If you need to sit down in the shower, use a plastic, non-slip stool. ?Keep the floor dry. Clean  up any water that spills on the floor as soon as it happens. ?Remove soap buildup in the tub or shower regularly. ?Attach bath mats securely with double-sided non-slip rug tape. ?Do not have throw rugs and other things on the floor that can make you trip. ?What can I do in the bedroom? ?Use night lights. ?Make sure that you have a light by your bed that is easy to reach. ?Do not use any sheets or blankets that are too big for your bed. They should not hang down onto the floor. ?Have a firm chair that has side arms. You can use this for support while you get dressed. ?Do not have throw rugs and other things on the floor that can make you trip. ?What can I do in the kitchen? ?Clean up any spills right away. ?Avoid walking on wet floors. ?Keep items that you use a lot in easy-to-reach places. ?If you need to reach something above you, use a strong step stool that has a grab bar. ?Keep electrical cords out of the way. ?Do not use floor polish or wax that makes floors slippery. If you must use wax, use non-skid floor wax. ?  Do not have throw rugs and other things on the floor that can make you trip. ?What can I do with my stairs? ?Do not leave any items on the stairs. ?Make sure that there are handrails on both sides of the stairs and use them. Fix handrails that are broken or loose. Make sure that handrails are as long as the stairways. ?Check any carpeting to make sure that it is firmly attached to the stairs. Fix any carpet that is loose or worn. ?Avoid having throw rugs at the top or bottom of the stairs. If you do have throw rugs, attach them to the floor with carpet tape. ?Make sure that you have a light switch at the top of the stairs and the bottom of the stairs. If you do not have them, ask someone to add them for you. ?What else can I do to help prevent falls? ?Wear shoes that: ?Do not have high heels. ?Have rubber bottoms. ?Are comfortable and fit you well. ?Are closed at the toe. Do not wear sandals. ?If you  use a stepladder: ?Make sure that it is fully opened. Do not climb a closed stepladder. ?Make sure that both sides of the stepladder are locked into place. ?Ask someone to hold it for you, if possible. ?Cle

## 2021-08-11 NOTE — Progress Notes (Signed)
? ?Subjective:  ? Valerie Love is a 66 y.o. female who presents for Medicare Annual (Subsequent) preventive examination. ? ?Review of Systems    ? ?   ?Objective:  ?  ?Today's Vitals  ? 08/11/21 1453  ?BP: 118/62  ?Pulse: 69  ?Temp: (!) 96.7 ?F (35.9 ?C)  ?TempSrc: Oral  ?SpO2: 97%  ?Weight: 157 lb 9.6 oz (71.5 kg)  ?Height: 5\' 1"  (1.549 m)  ? ?Body mass index is 29.78 kg/m?. ? ? ?  08/11/2021  ?  3:05 PM 08/04/2021  ?  9:31 AM 10/30/2018  ?  4:02 PM 04/19/2016  ?  8:15 AM 04/13/2016  ? 12:36 PM 04/05/2016  ? 12:24 PM 03/16/2016  ?  6:47 AM  ?Advanced Directives  ?Does Patient Have a Medical Advance Directive? Yes Yes No Yes No Yes No  ?Type of Advance Directive Living will;Healthcare Power of Attorney Living will;Healthcare Power of 13/12/2015 Power of [place];Living will  Healthcare Power of [place];Living will   ?Does patient want to make changes to medical advance directive? No - Patient declined No - Patient declined  No - Patient declined  No - Patient declined   ?Copy of Healthcare Power of Attorney in Chart? No - copy requested No - copy requested  No - copy requested  No - copy requested   ?Would patient like information on creating a medical advance directive?   No - Patient declined No - Patient declined  No - Patient declined   ? ? ?Current Medications (verified) ?Outpatient Encounter Medications as of 08/11/2021  ?Medication Sig  ? albuterol (VENTOLIN HFA) 108 (90 Base) MCG/ACT inhaler Inhale 2 puffs into the lungs every 6 (six) hours as needed for wheezing or shortness of breath.  ? ALPRAZolam (XANAX) 0.5 MG tablet TAKE 1 TABLET BY MOUTH THREE TIMES A DAY AS NEEDED FOR ANXIETY. MAXIMUM 3 TALBETS PER 24 HOURS  ? Calcium Carb-Cholecalciferol (CALCIUM+D3) 600-800 MG-UNIT TABS Take 1 tablet by mouth daily with breakfast.  ? citalopram (CELEXA) 20 MG tablet TAKE ONE TABLET BY MOUTH DAILY  ? diphenhydrAMINE (BENADRYL) 25 MG tablet Take 25 mg by mouth at bedtime as needed for allergies or sleep.    ? gabapentin (NEURONTIN) 100 MG capsule Take 1 capsule (100 mg total) by mouth 3 (three) times daily. Take 1 capsule qhs x 1 week, then bid x 7 days, then tid  ? metoprolol tartrate (LOPRESSOR) 25 MG tablet Take 1 tablet (25 mg total) by mouth 2 (two) times daily.  ? Multiple Vitamins-Minerals (ALIVE ONCE DAILY WOMENS 50+) TABS Take 1 tablet by mouth daily.  ? naproxen sodium (ANAPROX) 220 MG tablet Take 440 mg by mouth every morning.   ? nystatin (MYCOSTATIN/NYSTOP) powder Apply 1 application topically 3 (three) times daily.  ? nystatin-triamcinolone (MYCOLOG II) cream Apply 1 application topically 2 (two) times daily as needed.  ? predniSONE (DELTASONE) 50 MG tablet Take one tablet by mouth once daily for 5 days.  ? Probiotic Product (TRUBIOTICS) CAPS Take 1 capsule by mouth daily.  ? rosuvastatin (CRESTOR) 5 MG tablet Take 1 tablet (5 mg total) by mouth daily.  ? ?No facility-administered encounter medications on file as of 08/11/2021.  ? ? ?Allergies (verified) ?Codeine and Lactose intolerance (gi)  ? ?History: ?Past Medical History:  ?Diagnosis Date  ? Anxiety   ? Arthritis   ? R knee, back, hands   ? Cellulitis of knee 04/2016  ? RT KNEE  ? Cellulitis of right knee 04/18/2016  ?  Childhood asthma   ? Complication of anesthesia   ? woke up slowly - 107yrs. ago  ? Depression   ? related to husband illness and death  ? History of jaundice   ? as a child    ? Hx of varicella   ? Hyperthyroidism   ? during pregnancy, treated & resolved post partum   ? IBS (irritable bowel syndrome)   ? LBBB (left bundle branch block)   ? Scoliosis   ? Scoliosis 07/2015  ? Thyroid disease in pregnancy   ? TOBACCO DEPENDENCE 07/06/2006  ? Qualifier: Diagnosis of  By: Knox Royalty    ? Troponin level elevated   ? ?Past Surgical History:  ?Procedure Laterality Date  ? BREAST BIOPSY Right 1979  ? benign   ? Cyst removed from ovary    ? KNEE SURGERY    ? right duda   ? LEFT HEART CATH AND CORONARY ANGIOGRAPHY N/A 10/31/2018  ? Procedure: LEFT  HEART CATH AND CORONARY ANGIOGRAPHY;  Surgeon: Lennette Bihari, MD;  Location[facility] INVASIVE CV LAB;  Service: Cardiovascular;  Laterality: N/A;  ? TONSILLECTOMY AND ADENOIDECTOMY  1073  ? TOTAL KNEE ARTHROPLASTY 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  ? Procedure: TOTAL KNEE ARTHROPLASTY;  Surgeon: [name], MD;  Location: [facility] OR;  Service: Orthopedics;  Laterality: Right;  ? TUBAL LIGATION    ? WISDOM TOOTH EXTRACTION    ? ?Family History  ?Problem Relation Age of Onset  ? Alcohol abuse Mother   ?     died from stomach ulcers  ? Stroke Father   ?     died  from cva and mi  ? Hypertension Father   ? Hyperlipidemia Father   ? Coronary artery disease Father   ?     Age 18  ? Colon polyps Sister   ? Hepatitis C Brother   ? Colon cancer Neg Hx   ? ?Social History  ? ?Socioeconomic History  ? Marital status: Widowed  ?  Spouse name: Not on file  ? Number of children: 3  ? Years of education: Not on file  ? Highest education level: Not on file  ?Occupational History  ? 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 Smoking status: Former  ?  Packs/day: 0.25  ?  Years: 40.00  ?  Pack years: 10.00  ?  Types: Cigarettes  ?  Quit date: 01/06/2016  ?  Years since quitting: 5.6  ? Smokeless tobacco: Never  ? Tobacco comments:  ?  none in about 1 week  ?Substance and Sexual Activity  ? Alcohol use: Yes  ?  Alcohol/week: 0.0 standard drinks  ?  Comment: socially-wine- daily  ? Drug use: No  ? Sexual activity: Not on file  ?Other Topics Concern  ? Not on file  ?Social History Narrative  ? Husband passed away on 2008/02/26 hep c and cirrhosis.   She is hep c negative 2006  ? G3P3  ? HH of 2-3  son no pets   ? No falls .  Has smoke detector and wears seat belts. POsitive  firearms. No excess sun exposure.   ? 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  ? Had time of nono insuranced and cannot affort 800 per month for catastrophic   ? Stopped tobacco fall 2017 . ocass etoh.  No rec drugs.  ? Daughter patient in  our practice   [name]  ? ?  Social Determinants of Health  ? ?Financial Resource Strain: Low Risk   ? Difficulty of Paying Living Expenses: Not hard at all  ?Food Insecurity: No Food Insecurity  ? Worried About Programme researcher, broadcasting/film/video in the Last Year: Never true  ? Ran Out of Food in the Last Year: Never true  ?Transportation Needs: No Transportation Needs  ? Lack of Transportation (Medical): No  ? Lack of Transportation (Non-Medical): No  ?Physical Activity: Sufficiently Active  ? Days of Exercise per Week: 3 days  ? Minutes of Exercise per Session: 60 min  ?Stress: No Stress Concern Present  ? Feeling of Stress : Not at all  ?Social Connections: Moderately Integrated  ? Frequency of Communication with Friends and Family: More than three times a week  ? Frequency of Social Gatherings with Friends and Family: More than three times a week  ? Attends Religious Services: More than 4 times per year  ? Active Member of Clubs or Organizations: Yes  ? Attends Banker Meetings: More than 4 times per year  ? Marital Status: Widowed  ? ? ? ?Clinical Intake: ?How often do you need to have someone help you when you read instructions, pamphlets, or other written materials from your doctor or pharmacy?: 1 - Never ? ?Diabetic?  No ? ?Activities of Daily Living ? ?  08/11/2021  ?  3:02 PM  ?In your present state of health, do you have any difficulty performing the following activities:  ?Hearing? 0  ?Vision? 0  ?Difficulty concentrating or making decisions? 0  ?Walking or climbing stairs? 0  ?Dressing or bathing? 0  ?Doing errands, shopping? 0  ?Preparing Food and eating ? N  ?In the past six months, have you accidently leaked urine? N  ?Do you have problems with loss of bowel control? Y  ?Comment Patient states has IBS.Wears pads.  ?Managing your Medications? N  ?Managing your Finances? N  ?Housekeeping or managing your Housekeeping? N  ? ? ?Patient Care Team: ?Panosh, Neta Mends, MD as PCP - General (Internal  Medicine) ?Runell Gess, MD as PCP - Cardiology (Cardiology) ?Mitchel Honour, DO as Attending Physician (Obstetrics and Gynecology) ? ?Indicate any recent Medical Services you may have received from other than [facility]

## 2021-08-11 NOTE — Therapy (Signed)
?OUTPATIENT PHYSICAL THERAPY TREATMENT NOTE ? ? ?Patient Name: Valerie Love ?MRN: [id number] ?DOB:Jun 09, 1955, 66 y.o., female ?Today's Date: 08/11/2021 ? ?PCP: Madelin Headings, MD ?REFERRING PROVIDER: Juanda Chance, NP ? ?END OF SESSION:  ? PT End of Session - 08/11/21 1039   ? ? Visit Number 2   ? Number of Visits 20   ? Date for PT Re-Evaluation 10/13/21   ? Authorization Type [employer] $[id number]   ? PT Start Time 1015   ? PT Stop Time 1100   ? PT Time Calculation (min) 45 min   ? Activity Tolerance Patient tolerated treatment well   ? Behavior During Therapy [facility] for tasks assessed/performed   ? ?  ?  ? ?  ? ? ?Past Medical History:  ?Diagnosis Date  ? Anxiety   ? Arthritis   ? R knee, back, hands   ? Cellulitis of knee 04/2016  ? RT KNEE  ? Cellulitis of right knee 04/18/2016  ? Childhood asthma   ? Complication of anesthesia   ? woke up slowly - 68yrs. ago  ? Depression   ? related to husband illness and death  ? History of jaundice   ? as a child    ? Hx of varicella   ? Hyperthyroidism   ? during pregnancy, treated & resolved post partum   ? IBS (irritable bowel syndrome)   ? LBBB (left bundle branch block)   ? Scoliosis   ? Scoliosis 07/2015  ? Thyroid disease in pregnancy   ? TOBACCO DEPENDENCE 07/06/2006  ? Qualifier: Diagnosis of  By: Knox Royalty    ? Troponin level elevated   ? ?Past Surgical History:  ?Procedure Laterality Date  ? BREAST BIOPSY Right 1979  ? benign   ? Cyst removed from ovary    ? KNEE SURGERY    ? right duda   ? LEFT HEART CATH AND CORONARY ANGIOGRAPHY N/A 10/31/2018  ? Procedure: LEFT HEART CATH AND CORONARY ANGIOGRAPHY;  Surgeon: Lennette Bihari, MD;  Location: [facility] INVASIVE CV LAB;  Service: Cardiovascular;  Laterality: N/A;  ? TONSILLECTOMY AND ADENOIDECTOMY  1073  ? TOTAL KNEE ARTHROPLASTY 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  ? Procedure: TOTAL KNEE ARTHROPLASTY;  Surgeon: [name], MD;  Location: [facility] OR;  Service: Orthopedics;  Laterality: Right;  ? TUBAL LIGATION    ? WISDOM TOOTH EXTRACTION     ? ?Patient Active Problem List  ? Diagnosis Date Noted  ? Acute systolic heart failure (HCC) 11/01/2018  ? NSTEMI (non-ST elevated myocardial infarction) (HCC) 10/30/2018  ? Hyperlipidemia 06/13/2018  ? Lactose intolerance 06/13/2018  ? S/P total knee arthroplasty, right 03/16/2016  ? Unilateral primary osteoarthritis, right knee   ? Special screening for malignant neoplasms, colon 12/29/2014  ? Diarrhea 12/29/2014  ? Medication management 05/13/2013  ? Does not have health insurance 05/13/2013  ? Adjustment reaction with anxiety and depression 03/12/2012  ? Medication monitoring encounter 03/12/2012  ? Hemorrhoids 08/13/2011  ? Perineal itching, female 08/13/2011  ? LBBB (left bundle branch block) 09/24/2010  ? Murmur 09/08/2010  ? EKG, abnormal 08/19/2010  ? Visit for preventive health examination 08/19/2010  ? Palpitations 08/18/2010  ? Dizziness 08/18/2010  ? Arthritis 08/18/2010  ? Anxiety state 06/18/2008  ? GRIEF REACTION 06/18/2008  ? DEPRESSION, MAJOR, RECURRENT 07/06/2006  ? ARTHRALGIA, UNSPECIFIED 07/06/2006  ? ? ? ?THERAPY DIAG:  ?Cervicalgia ? ?Pain in right arm ? ?Muscle weakness (generalized) ? ?Abnormal posture ? ?PCP: Madelin Headings, MD ?  ?REFERRING PROVIDER:  Juanda Chance, NP ?  ?REFERRING DIAG: M54.12 (ICD-10-CM) - Radiculopathy, cervical region ?M54.2 (ICD-10-CM) - Cervicalgia ?M79.18 (ICD-10-CM) - Myofascial pain syndrome ? ?  ?ONSET DATE: onset several years with intermittent complaints ?  ?SUBJECTIVE:                                                                                                                                                                                                        ?  ?SUBJECTIVE STATEMENT: ?She says she did not feel any improvement from DN last time so she is unsure if she wants to have this again. She says she has been doing her HEP. ?  ?PERTINENT HISTORY:  ?HIstory of scoliosis, hyperthyroidism ?  ?PAIN:  ?Are you having pain? Yes: NPRS scale: current:  6/10 , at worst 8/10 ?Pain location: Neck, bilateral UE (Rt> Lt) ?Pain description: tightness, achy ?Aggravating factors: lifting items, reaching above head, carrying items ?Relieving factors: Hot showers, CBD oil helps short term ?  ?PRECAUTIONS: None ?  ?WEIGHT BEARING RESTRICTIONS No ?  ?OCCUPATION: Housecleaning business ?  ?PLOF: Independent, work in yard, Rt hand dominant ?  ?PATIENT GOALS Reduce pain, be more active ?  ?OBJECTIVE:  ?  ?DIAGNOSTIC FINDINGS:  ?MRI performed on 07/15/2021 showing grade 1 anterolisthesis at C2-C3 and C3-C4, multilevel facet hypertrophy, disc bulge with bilateral disc osteophyte ridge/uncinate hypertrophy that contacts and minimally flattens the ventral spinal cord at C4-C5 and C5-C6. No high grade spinal canal stenosis noted - per MD office visit note by referring provider  ?  ?PATIENT SURVEYS:  ? 08/04/2021:  FOTO intake: 51  predicted  56 ?  ?  ?COGNITION: ?08/04/2021 Overall cognitive status: Within functional limits for tasks assessed ?  ?  ?SENSATION: ?08/04/2021 WFL ?  ?POSTURE:  ?08/04/2021 : Rounded shoulder, forward head posture ?  ?PALPATION: ?08/04/2021 :   Trigger points c concordant pain symptoms noted in bilateral upper trap, levator scap.  Noted Rt upper arm symptoms c trigger points in Rt infraspinatus ?  ?CERVICAL ROM:  ?          08/04/2021: no change in arm symptoms c cervical AROM below ?Active ROM AROM (deg) ?08/04/2021  ?Flexion 50  ?Extension 48  ?Right lateral flexion    ?Left lateral flexion    ?Right rotation 75  ?Left rotation 60 c lt neck pain  ? (Blank rows = not tested) ?  ?UE ROM: ?  ?Active ROM Right ?08/04/2021 Left ?08/04/2021  ?Shoulder flexion WFL against gravity WFL against gravity  ?Shoulder extension      ?  Shoulder abduction      ?Shoulder adduction      ?Shoulder extension      ?Shoulder internal rotation      ?Shoulder external rotation      ?Elbow flexion      ?Elbow extension      ?Wrist flexion      ?Wrist extension      ?Wrist ulnar deviation       ?Wrist radial deviation      ?Wrist pronation      ?Wrist supination      ? (Blank rows = not tested) ?  ?UE MMT: ?  ?MMT Right ?08/04/2021 Left ?08/04/2021  ?Shoulder flexion 4/5 c pain 5/5  ?Shoulder extension      ?Shoulder abduction 4/5 c pain 5/5  ?Shoulder adduction      ?Shoulder extension      ?Shoulder internal rotation 5/5 5/5  ?Shoulder external rotation 4/5 5/5  ?Middle trapezius      ?Lower trapezius      ?Elbow flexion 5/5 5/5  ?Elbow extension 5/5 5/5  ?Wrist flexion      ?Wrist extension      ?Wrist ulnar deviation      ?Wrist radial deviation      ?Wrist pronation      ?Wrist supination      ?Grip strength      ? (Blank rows = not tested) ?  ?CERVICAL SPECIAL TESTS:  ?08/04/2021      (-) spurlings compression bilateral. (-) radicular complaints c cervical ROM.  ?                      (-) Drop arm Lt, painful arc Lt ?  ?TODAY'S TREATMENT:  ?08/09/21 ?Manual therapy: STM and IASTM to bilat upper traps, cervical paraspinals, infra/supraspinatus X25 min ?Mechanical Cervical traction 18-13# intermittent 20 min total including set up ? ?08/04/2021 ?            Therex:          HEP instruction/performance c cues for techniques, handout provided.  Trial set performed of each for comprehension and symptom assessment.  See below for exercise list. ?  ?            Manual:          ?                                  Compression bilateral upper trap ?             ?            Dry Needling: ?                                  Bilateral upper trap twitch response noted c localized symptoms. No adverse reaction noted ?            Modalities: ?                                  Moist heat to bilateral upper trap c education on HEP in therex ?  ?  ?PATIENT EDUCATION:  ?Education details: HEP, POC, DN ?Person educated: Patient ?Education method: Explanation, Demonstration, Verbal cues, and Handouts ?Education comprehension: verbalized understanding and returned demonstration ?  ?  ?  HOME EXERCISE PROGRAM: ?08/04/2021: ?            Access Code: [email or web address] ?URL: [email or web address] ?Date: 08/04/2021 ?Prepared by: Chyrel Masson ?  ?Exercises ?- Seated Gentle Upper Trapezius Stretch  - 3-5 x daily - 7 x weekly - 1

## 2021-08-18 ENCOUNTER — Encounter: Payer: Self-pay | Admitting: Physical Therapy

## 2021-08-18 ENCOUNTER — Ambulatory Visit: Payer: PPO | Admitting: Physical Therapy

## 2021-08-18 DIAGNOSIS — R293 Abnormal posture: Secondary | ICD-10-CM | POA: Diagnosis not present

## 2021-08-18 DIAGNOSIS — M6281 Muscle weakness (generalized): Secondary | ICD-10-CM

## 2021-08-18 DIAGNOSIS — M79601 Pain in right arm: Secondary | ICD-10-CM | POA: Diagnosis not present

## 2021-08-18 DIAGNOSIS — M542 Cervicalgia: Secondary | ICD-10-CM

## 2021-08-18 NOTE — Therapy (Signed)
?OUTPATIENT PHYSICAL THERAPY TREATMENT NOTE ? ? ?Patient Name: Valerie Love ?MRN: [id number] ?DOB:19-Oct-1955, 66 y.o., female ?Today's Date: 08/18/2021 ? ?PCP: Burnis Medin, MD ?REFERRING PROVIDER: Burnis Medin, MD ? ?END OF SESSION:  ? PT End of Session - 08/18/21 1523   ? ? Visit Number 3   ? Number of Visits 20   ? Date for PT Re-Evaluation 10/13/21   ? Authorization Type [employer] $15   ? PT Start Time 1435   ? PT Stop Time 1515   ? PT Time Calculation (min) 40 min   ? Activity Tolerance Patient tolerated treatment well   ? Behavior During Therapy [facility] for tasks assessed/performed   ? ?  ?  ? ?  ? ? ?Past Medical History:  ?Diagnosis Date  ? Anxiety   ? Arthritis   ? R knee, back, hands   ? Cellulitis of knee 04/2016  ? RT KNEE  ? Cellulitis of right knee 04/18/2016  ? Childhood asthma   ? Complication of anesthesia   ? woke up slowly - 79yrs. ago  ? Depression   ? related to husband illness and death  ? History of jaundice   ? as a child    ? Hx of varicella   ? Hyperthyroidism   ? during pregnancy, treated & resolved post partum   ? IBS (irritable bowel syndrome)   ? LBBB (left bundle branch block)   ? Scoliosis   ? Scoliosis 07/2015  ? Thyroid disease in pregnancy   ? TOBACCO DEPENDENCE 07/06/2006  ? Qualifier: Diagnosis of  By: Samara Snide    ? Troponin level elevated   ? ?Past Surgical History:  ?Procedure Laterality Date  ? BREAST BIOPSY Right 1979  ? benign   ? Cyst removed from ovary    ? KNEE SURGERY    ? right duda   ? LEFT HEART CATH AND CORONARY ANGIOGRAPHY N/A 10/31/2018  ? Procedure: LEFT HEART CATH AND CORONARY ANGIOGRAPHY;  Surgeon: Troy Sine, MD;  Location: [facility];  Service: Cardiovascular;  Laterality: N/A;  ? TONSILLECTOMY AND ADENOIDECTOMY  1073  ? TOTAL KNEE ARTHROPLASTY 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  ? Procedure: TOTAL KNEE ARTHROPLASTY;  Surgeon: Newt Minion, MD;  Location: [facility];  Service: Orthopedics;  Laterality: Right;  ? TUBAL LIGATION    ? WISDOM TOOTH EXTRACTION     ? ?Patient Active Problem List  ? Diagnosis Date Noted  ? Acute systolic heart failure ([place]) 11/01/2018  ? NSTEMI (non-ST elevated myocardial infarction) ([place]) 10/30/2018  ? Hyperlipidemia 06/13/2018  ? Lactose intolerance 06/13/2018  ? S/P total knee arthroplasty, right 03/16/2016  ? Unilateral primary osteoarthritis, right knee   ? Special screening for malignant neoplasms, colon 12/29/2014  ? Diarrhea 12/29/2014  ? Medication management 05/13/2013  ? Does not have health insurance 05/13/2013  ? Adjustment reaction with anxiety and depression 03/12/2012  ? Medication monitoring encounter 03/12/2012  ? Hemorrhoids 08/13/2011  ? Perineal itching, female 08/13/2011  ? LBBB (left bundle branch block) 09/24/2010  ? Murmur 09/08/2010  ? EKG, abnormal 08/19/2010  ? Visit for preventive health examination 08/19/2010  ? Palpitations 08/18/2010  ? Dizziness 08/18/2010  ? Arthritis 08/18/2010  ? Anxiety state 06/18/2008  ? GRIEF REACTION 06/18/2008  ? DEPRESSION, MAJOR, RECURRENT 07/06/2006  ? ARTHRALGIA, UNSPECIFIED 07/06/2006  ? ? ? ?THERAPY DIAG:  ?Cervicalgia ? ?Pain in right arm ? ?Muscle weakness (generalized) ? ?Abnormal posture ? ?PCP: Burnis Medin, MD ?  ?REFERRING PROVIDER:  Juanda Chance, NP ?  ?REFERRING DIAG: M54.12 (ICD-10-CM) - Radiculopathy, cervical region ?M54.2 (ICD-10-CM) - Cervicalgia ?M79.18 (ICD-10-CM) - Myofascial pain syndrome ? ?  ?ONSET DATE: onset several years with intermittent complaints ?  ?SUBJECTIVE:                                                                                                                                                                                                        ?  ?SUBJECTIVE STATEMENT: ?She says the treatment last time really helps and wants the same treatment. Has not had pain on left side but still with pain on Rt side ?  ?PERTINENT HISTORY:  ?HIstory of scoliosis, hyperthyroidism ?  ?PAIN:  ?Are you having pain? Yes: NPRS scale: current:  4/10 ?Pain location: Neck, bilateral UE (Rt> Lt) ?Pain description: tightness, achy ?Aggravating factors: lifting items, reaching above head, carrying items ?Relieving factors: Hot showers, CBD oil helps short term ?  ?PRECAUTIONS: None ?  ?WEIGHT BEARING RESTRICTIONS No ?  ?OCCUPATION: Housecleaning business ?  ?PLOF: Independent, work in yard, Rt hand dominant ?  ?PATIENT GOALS Reduce pain, be more active ?  ?OBJECTIVE:  ?  ?DIAGNOSTIC FINDINGS:  ?MRI performed on 07/15/2021 showing grade 1 anterolisthesis at C2-C3 and C3-C4, multilevel facet hypertrophy, disc bulge with bilateral disc osteophyte ridge/uncinate hypertrophy that contacts and minimally flattens the ventral spinal cord at C4-C5 and C5-C6. No high grade spinal canal stenosis noted - per MD office visit note by referring provider  ?  ?PATIENT SURVEYS:  ? 08/04/2021:  FOTO intake: 51  predicted  56 ?  ?  ?COGNITION: ?08/04/2021 Overall cognitive status: Within functional limits for tasks assessed ?  ?  ?SENSATION: ?08/04/2021 WFL ?  ?POSTURE:  ?08/04/2021 : Rounded shoulder, forward head posture ?  ?PALPATION: ?08/04/2021 :   Trigger points c concordant pain symptoms noted in bilateral upper trap, levator scap.  Noted Rt upper arm symptoms c trigger points in Rt infraspinatus ?  ?CERVICAL ROM:  ?          08/04/2021: no change in arm symptoms c cervical AROM below ?Active ROM AROM (deg) ?08/04/2021  ?Flexion 50  ?Extension 48  ?Right lateral flexion    ?Left lateral flexion    ?Right rotation 75  ?Left rotation 60 c lt neck pain  ? (Blank rows = not tested) ?  ?UE ROM: ?  ?Active ROM Right ?08/04/2021 Left ?08/04/2021  ?Shoulder flexion WFL against gravity WFL against gravity  ?Shoulder extension      ?Shoulder abduction      ?Shoulder  adduction      ?Shoulder extension      ?Shoulder internal rotation      ?Shoulder external rotation      ?Elbow flexion      ?Elbow extension      ?Wrist flexion      ?Wrist extension      ?Wrist ulnar deviation      ?Wrist radial  deviation      ?Wrist pronation      ?Wrist supination      ? (Blank rows = not tested) ?  ?UE MMT: ?  ?MMT Right ?08/04/2021 Left ?08/04/2021  ?Shoulder flexion 4/5 c pain 5/5  ?Shoulder extension      ?Shoulder abduction 4/5 c pain 5/5  ?Shoulder adduction      ?Shoulder extension      ?Shoulder internal rotation 5/5 5/5  ?Shoulder external rotation 4/5 5/5  ?Middle trapezius      ?Lower trapezius      ?Elbow flexion 5/5 5/5  ?Elbow extension 5/5 5/5  ?Wrist flexion      ?Wrist extension      ?Wrist ulnar deviation      ?Wrist radial deviation      ?Wrist pronation      ?Wrist supination      ?Grip strength      ? (Blank rows = not tested) ?  ?CERVICAL SPECIAL TESTS:  ?08/04/2021      (-) spurlings compression bilateral. (-) radicular complaints c cervical ROM.  ?                      (-) Drop arm Lt, painful arc Lt ?  ?TODAY'S TREATMENT:  ?08/18/21 ?Manual therapy: STM and IASTM with biofreeze to bilat upper traps, cervical paraspinals, infra/supraspinatus X20 min ?Mechanical Cervical traction 18-13# (initially then due to discomfort reduced to 14-9# intermittent 15 min  ?Rt upper trap stretch 10 sec X 5 ?08/09/21 ?Manual therapy: STM and IASTM to bilat upper traps, cervical paraspinals, infra/supraspinatus X25 min ?Mechanical Cervical traction 18-13# intermittent 20 min total including set up ? ?08/04/2021 ?            Therex:          HEP instruction/performance c cues for techniques, handout provided.  Trial set performed of each for comprehension and symptom assessment.  See below for exercise list. ?  ?            Manual:          ?                                  Compression bilateral upper trap ?             ?            Dry Needling: ?                                  Bilateral upper trap twitch response noted c localized symptoms. No adverse reaction noted ?            Modalities: ?                                  Moist heat to bilateral upper trap c education on HEP  in therex ?  ?  ?PATIENT EDUCATION:   ?Education details: HEP, POC, DN ?Person educated: Patient ?Education method: Explanation, Demonstration, Verbal cues, and Handouts ?Education comprehension: verbalized understanding and returned demonstration ?  ?  ?H

## 2021-08-19 ENCOUNTER — Other Ambulatory Visit: Payer: Self-pay

## 2021-08-21 ENCOUNTER — Other Ambulatory Visit: Payer: Self-pay | Admitting: Family

## 2021-08-21 ENCOUNTER — Other Ambulatory Visit: Payer: Self-pay | Admitting: Internal Medicine

## 2021-08-23 ENCOUNTER — Telehealth: Payer: Self-pay | Admitting: Family

## 2021-08-23 NOTE — Telephone Encounter (Signed)
Pt called and would like a refill on gabapentin.  ?

## 2021-08-23 NOTE — Telephone Encounter (Signed)
Do you want to refill? 

## 2021-08-24 ENCOUNTER — Encounter: Payer: Self-pay | Admitting: Rehabilitative and Restorative Service Providers"

## 2021-08-24 ENCOUNTER — Ambulatory Visit: Payer: PPO | Admitting: Rehabilitative and Restorative Service Providers"

## 2021-08-24 ENCOUNTER — Other Ambulatory Visit: Payer: Self-pay

## 2021-08-24 DIAGNOSIS — R293 Abnormal posture: Secondary | ICD-10-CM | POA: Diagnosis not present

## 2021-08-24 DIAGNOSIS — M79601 Pain in right arm: Secondary | ICD-10-CM

## 2021-08-24 DIAGNOSIS — M542 Cervicalgia: Secondary | ICD-10-CM | POA: Diagnosis not present

## 2021-08-24 DIAGNOSIS — M6281 Muscle weakness (generalized): Secondary | ICD-10-CM | POA: Diagnosis not present

## 2021-08-24 NOTE — Therapy (Addendum)
?OUTPATIENT PHYSICAL THERAPY TREATMENT NOTE  /Discharge ? ? ?Patient Name: Valerie Love ?MRN: [id number] ?DOB:Jul 19, 1955, 66 y.o., female ?Today's Date: 08/24/2021 ? ?PCP: Madelin Headings, MD ?REFERRING PROVIDER: Juanda Chance, NP ? ?END OF SESSION:  ? PT End of Session - 08/24/21 1422   ? ? Visit Number 4   ? Number of Visits 20   ? Date for PT Re-Evaluation 10/13/21   ? Authorization Type [employer] $[id number]   ? PT Start Time 1345   ? PT Stop Time 1428   ? PT Time Calculation (min) 43 min   ? Activity Tolerance Patient tolerated treatment well   ? Behavior During Therapy [facility] for tasks assessed/performed   ? ?  ?  ? ?  ? ? ? ?Past Medical History:  ?Diagnosis Date  ? Anxiety   ? Arthritis   ? R knee, back, hands   ? Cellulitis of knee 04/2016  ? RT KNEE  ? Cellulitis of right knee 04/18/2016  ? Childhood asthma   ? Complication of anesthesia   ? woke up slowly - 5yrs. ago  ? Depression   ? related to husband illness and death  ? History of jaundice   ? as a child    ? Hx of varicella   ? Hyperthyroidism   ? during pregnancy, treated & resolved post partum   ? IBS (irritable bowel syndrome)   ? LBBB (left bundle branch block)   ? Scoliosis   ? Scoliosis 07/2015  ? Thyroid disease in pregnancy   ? TOBACCO DEPENDENCE 07/06/2006  ? Qualifier: Diagnosis of  By: Knox Royalty    ? Troponin level elevated   ? ?Past Surgical History:  ?Procedure Laterality Date  ? BREAST BIOPSY Right 1979  ? benign   ? Cyst removed from ovary    ? KNEE SURGERY    ? right duda   ? LEFT HEART CATH AND CORONARY ANGIOGRAPHY N/A 10/31/2018  ? Procedure: LEFT HEART CATH AND CORONARY ANGIOGRAPHY;  Surgeon: Lennette Bihari, MD;  Location: [facility] INVASIVE CV LAB;  Service: Cardiovascular;  Laterality: N/A;  ? TONSILLECTOMY AND ADENOIDECTOMY  1073  ? TOTAL KNEE ARTHROPLASTY 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  ? Procedure: TOTAL KNEE ARTHROPLASTY;  Surgeon: [name], MD;  Location: [facility] OR;  Service: Orthopedics;  Laterality: Right;  ? TUBAL LIGATION    ? WISDOM TOOTH  EXTRACTION    ? ?Patient Active Problem List  ? Diagnosis Date Noted  ? Acute systolic heart failure (HCC) 11/01/2018  ? NSTEMI (non-ST elevated myocardial infarction) (HCC) 10/30/2018  ? Hyperlipidemia 06/13/2018  ? Lactose intolerance 06/13/2018  ? S/P total knee arthroplasty, right 03/16/2016  ? Unilateral primary osteoarthritis, right knee   ? Special screening for malignant neoplasms, colon 12/29/2014  ? Diarrhea 12/29/2014  ? Medication management 05/13/2013  ? Does not have health insurance 05/13/2013  ? Adjustment reaction with anxiety and depression 03/12/2012  ? Medication monitoring encounter 03/12/2012  ? Hemorrhoids 08/13/2011  ? Perineal itching, female 08/13/2011  ? LBBB (left bundle branch block) 09/24/2010  ? Murmur 09/08/2010  ? EKG, abnormal 08/19/2010  ? Visit for preventive health examination 08/19/2010  ? Palpitations 08/18/2010  ? Dizziness 08/18/2010  ? Arthritis 08/18/2010  ? Anxiety state 06/18/2008  ? GRIEF REACTION 06/18/2008  ? DEPRESSION, MAJOR, RECURRENT 07/06/2006  ? ARTHRALGIA, UNSPECIFIED 07/06/2006  ? ? ? ?THERAPY DIAG:  ?Cervicalgia ? ?Pain in right arm ? ?Muscle weakness (generalized) ? ?Abnormal posture ? ?PCP: Madelin Headings, MD ?  ?  REFERRING PROVIDER: Juanda Chance, NP ?  ?REFERRING DIAG: M54.12 (ICD-10-CM) - Radiculopathy, cervical region ?M54.2 (ICD-10-CM) - Cervicalgia ?M79.18 (ICD-10-CM) - Myofascial pain syndrome ? ?  ?ONSET DATE: onset several years with intermittent complaints ?  ?SUBJECTIVE:                                                                                                                                                                                                        ?  ?SUBJECTIVE STATEMENT: ?Pt indicated complaints into Rt elbow upon arrival today.  ?  ?PERTINENT HISTORY:  ?HIstory of scoliosis, hyperthyroidism ?  ?PAIN:  ?Are you having pain? Yes: NPRS scale: current: 4/10 at rest, 9/10 sharp at times ?Pain location: Neck, bilateral UE (Rt>  Lt) ?Pain description: tightness, achy ?Aggravating factors: lifting items, reaching above head, carrying items ?Relieving factors: Hot showers, CBD oil helps short term ?  ?PRECAUTIONS: None ?  ?WEIGHT BEARING RESTRICTIONS No ?  ?OCCUPATION: Housecleaning business ?  ?PLOF: Independent, work in yard, Rt hand dominant ?  ?PATIENT GOALS Reduce pain, be more active ?  ?OBJECTIVE:  ?  ?DIAGNOSTIC FINDINGS:  ?MRI performed on 07/15/2021 showing grade 1 anterolisthesis at C2-C3 and C3-C4, multilevel facet hypertrophy, disc bulge with bilateral disc osteophyte ridge/uncinate hypertrophy that contacts and minimally flattens the ventral spinal cord at C4-C5 and C5-C6. No high grade spinal canal stenosis noted - per MD office visit note by referring provider  ?  ?PATIENT SURVEYS:  ? 08/04/2021:  FOTO intake: 51  predicted  56 ?  ?COGNITION: ?08/04/2021 Overall cognitive status: Within functional limits for tasks assessed ?  ?  ?SENSATION: ?08/04/2021 WFL ?  ?POSTURE:  ?08/04/2021 : Rounded shoulder, forward head posture ?  ?PALPATION: ?08/04/2021 :   Trigger points c concordant pain symptoms noted in bilateral upper trap, levator scap.  Noted Rt upper arm symptoms c trigger points in Rt infraspinatus ?  ?CERVICAL ROM:  ?          08/04/2021: no change in arm symptoms c cervical AROM below ?Active ROM AROM (deg) ?08/04/2021 AROM ?08/24/2021  ?Flexion 50 55  ?Extension 48 60  ?Right lateral flexion     ?Left lateral flexion     ?Right rotation 75 75  ?Left rotation 60 c lt neck pain 64  ? (Blank rows = not tested) ?  ?UE ROM: ?  ?Active ROM Right ?08/04/2021 Left ?08/04/2021  ?Shoulder flexion WFL against gravity WFL against gravity  ?Shoulder extension      ?Shoulder abduction      ?Shoulder adduction      ?  Shoulder extension      ?Shoulder internal rotation      ?Shoulder external rotation      ?Elbow flexion      ?Elbow extension      ?Wrist flexion      ?Wrist extension      ?Wrist ulnar deviation      ?Wrist radial deviation       ?Wrist pronation      ?Wrist supination      ? (Blank rows = not tested) ?  ?UE MMT: ?  ?MMT Right ?08/04/2021 Left ?08/04/2021 Right ?08/24/2021  ?Shoulder flexion 4/5 c pain 5/5 4/5  ?Shoulder extension       ?Shoulder abduction 4/5 c pain 5/5 4/5  ?Shoulder adduction       ?Shoulder extension       ?Shoulder internal rotation 5/5 5/5   ?Shoulder external rotation 4/5 5/5 4/5  ?Middle trapezius       ?Lower trapezius       ?Elbow flexion 5/5 5/5   ?Elbow extension 5/5 5/5   ?Wrist flexion       ?Wrist extension       ?Wrist ulnar deviation       ?Wrist radial deviation       ?Wrist pronation       ?Wrist supination       ?Grip strength       ? (Blank rows = not tested) ?  ?CERVICAL SPECIAL TESTS:  ?08/04/2021      (-) spurlings compression bilateral. (-) radicular complaints c cervical ROM.  ?                      (-) Drop arm Lt, painful arc Lt ?  ?TODAY'S TREATMENT:  ?08/24/2021 ?            Therex: ?Green band Rows 2 x 15 ?Green band gh ext 2 x 15 ?Green band ER c towel at side 2 x 10, performed bilateral ?Supine UC flexion hold 5 sec x 10 ?    ?            Manual:         ?                       Compression c movement to Rt infraspinatus, Percussive device STM to same ? ? Traction ?  Cervical Traction in supine 14 lbs/8 lbs intermittent 60 sec /20 sec 10 mins ?             ?           ?           ?08/18/21 ?Manual therapy: STM and IASTM with biofreeze to bilat upper traps, cervical paraspinals, infra/supraspinatus X20 min ?Mechanical Cervical traction 18-13# (initially then due to discomfort reduced to 14-9# intermittent 15 min  ?Rt upper trap stretch 10 sec X 5 ? ?08/09/21 ?Manual therapy: STM and IASTM to bilat upper traps, cervical paraspinals, infra/supraspinatus X25 min ?Mechanical Cervical traction 18-13# intermittent 20 min total including set up ? ?08/04/2021 ?            Therex:          HEP instruction/performance c cues for techniques, handout provided.  Trial set performed of each for comprehension and  symptom assessment.  See below for exercise list. ?  ?            Manual:          ?  Compression bilateral upper trap ?             ?            Dry Needling: ?

## 2021-08-24 NOTE — Telephone Encounter (Signed)
This was sent to the pharm

## 2021-08-24 NOTE — Telephone Encounter (Signed)
Pt was in office. Asking about medication again.  ?

## 2021-08-25 ENCOUNTER — Ambulatory Visit: Payer: Self-pay

## 2021-08-25 ENCOUNTER — Encounter: Payer: Self-pay | Admitting: Physical Medicine and Rehabilitation

## 2021-08-25 ENCOUNTER — Ambulatory Visit: Payer: PPO | Admitting: Physical Medicine and Rehabilitation

## 2021-08-25 VITALS — BP 125/57 | HR 67

## 2021-08-25 DIAGNOSIS — M5412 Radiculopathy, cervical region: Secondary | ICD-10-CM | POA: Diagnosis not present

## 2021-08-25 MED ORDER — METHYLPREDNISOLONE ACETATE 80 MG/ML IJ SUSP
80.0000 mg | Freq: Once | INTRAMUSCULAR | Status: AC
  Administered 2021-08-25: 80 mg

## 2021-08-25 NOTE — Patient Instructions (Signed)

## 2021-08-25 NOTE — Progress Notes (Signed)
Pt state neck pain. Pt state any movement makes the pain worse. Pt state she takes pain meds to help ease her pain. ? ?Numeric Pain Rating Scale and Functional Assessment ?Average Pain 5 ? ? ?In the last MONTH (on 0-10 scale) has pain interfered with the following? ? ?1. General activity like being  able to carry out your everyday physical activities such as walking, climbing stairs, carrying groceries, or moving a chair?  ?Rating(8) ? ? ?+Driver, -BT, -Dye Allergies. ? ?

## 2021-08-27 ENCOUNTER — Encounter: Payer: PPO | Admitting: Rehabilitative and Restorative Service Providers"

## 2021-08-30 NOTE — Progress Notes (Signed)
? ?Chief Complaint  ?Patient presents with  ? Annual Exam  ? ? ?HPI: ?Patient  Valerie Love  66 y.o. comes in today for Preventive Health Care visit  ?No major changes in health status ?Back pain is problematic continuing physical therapy has seen Dr. Lajoyce Corners not a surgical candidate for help. ? ?Is having some palpitations rapid rate thinks it might be anxiety son is to be married soon. ?supposed to be seeing cardiology Dr. Allyson Sabal once a year but cardiology has not reached out ?No specific chest pain with this will take alprazolam with some help. ? ? ?Health Maintenance  ?Topic Date Due  ? Pneumonia Vaccine 80+ Years old (2 - PCV) 12/20/2021 (Originally 12/27/2019)  ? COVID-19 Vaccine (4 - Booster for Pfizer series) 12/20/2021 (Originally 07/03/2020)  ? INFLUENZA VACCINE  12/07/2021  ? MAMMOGRAM  10/01/2022  ? COLONOSCOPY (Pts 45-6yrs Insurance coverage will need to be confirmed)  01/05/2025  ? TETANUS/TDAP  02/22/2029  ? DEXA SCAN  Completed  ? Hepatitis C Screening  Completed  ? Zoster Vaccines- Shingrix  Completed  ? HPV VACCINES  Aged Out  ?States has had both pneumonia vaccines including the combination 1 although not documented in record. ?Last dexa in system -1.8 2017 ?Health Maintenance Review ?LIFESTYLE:  ?Exercise:   back pain chronic   not surgical   limits     ?Tobacco/ETS: no ?Alcohol:  1 per day  ?Sugar beverages: no ?Sleep: 7 hours  br  ?Drug use: no ?HH of 4-5  ?Work: 5 hours  x 3-4 days per week physical work ? ?ROS:  ?REST of 12 system review negative except as per HPI gets nocturnal leg cramps toes will cramp up gets up and walks it off.  No claudication symptoms states she has ongoing chronic postprandial "diarrhea" related to what she eats.  Lactose intolerance ? ? ?Past Medical History:  ?Diagnosis Date  ? Anxiety   ? Arthritis   ? R knee, back, hands   ? Cellulitis of knee 04/2016  ? RT KNEE  ? Cellulitis of right knee 04/18/2016  ? Childhood asthma   ? Complication of anesthesia   ? woke  up slowly - 78yrs. ago  ? Depression   ? related to husband illness and death  ? History of jaundice   ? as a child    ? Hx of varicella   ? Hyperthyroidism   ? during pregnancy, treated & resolved post partum   ? IBS (irritable bowel syndrome)   ? LBBB (left bundle branch block)   ? Scoliosis   ? Scoliosis 07/2015  ? Thyroid disease in pregnancy   ? TOBACCO DEPENDENCE 07/06/2006  ? Qualifier: Diagnosis of  By: Knox Royalty    ? Troponin level elevated   ? ? ?Past Surgical History:  ?Procedure Laterality Date  ? BREAST BIOPSY Right 1979  ? benign   ? Cyst removed from ovary    ? KNEE SURGERY    ? right duda   ? LEFT HEART CATH AND CORONARY ANGIOGRAPHY N/A 10/31/2018  ? Procedure: LEFT HEART CATH AND CORONARY ANGIOGRAPHY;  Surgeon: Lennette Bihari, MD;  Location: [facility] INVASIVE CV LAB;  Service: Cardiovascular;  Laterality: N/A;  ? TONSILLECTOMY AND ADENOIDECTOMY  1073  ? TOTAL KNEE ARTHROPLASTY 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  ? Procedure: TOTAL KNEE ARTHROPLASTY;  Surgeon: [name], MD;  Location: [facility] OR;  Service: Orthopedics;  Laterality: Right;  ? TUBAL LIGATION    ? WISDOM TOOTH EXTRACTION    ? ? ?  Family History  ?Problem Relation Age of Onset  ? Alcohol abuse Mother   ?     died from stomach ulcers  ? Stroke Father   ?     died  from cva and mi  ? Hypertension Father   ? Hyperlipidemia Father   ? Coronary artery disease Father   ?     Age 27  ? Colon polyps Sister   ? Hepatitis C Brother   ? Colon cancer Neg Hx   ? ? ?Social History  ? ?Socioeconomic History  ? Marital status: Widowed  ?  Spouse name: Not on file  ? Number of children: 3  ? Years of education: Not on file  ? Highest education level: Not on file  ?Occupational History  ? 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 Smoking status: Former  ?  Packs/day: 0.25  ?  Years: 40.00  ?  Pack years: 10.00  ?  Types: Cigarettes  ?  Quit date: 01/06/2016  ?  Years since quitting: 5.6  ? Smokeless tobacco: Never  ? Tobacco comments:  ?  none in about 1  week  ?Substance and Sexual Activity  ? Alcohol use: Yes  ?  Alcohol/week: 0.0 standard drinks  ?  Comment: socially-wine- daily  ? Drug use: No  ? Sexual activity: Not on file  ?Other Topics Concern  ? Not on file  ?Social History Narrative  ? Husband passed away on March 06, 2008 hep c and cirrhosis.   She is hep c negative 2006  ? G3P3  ? HH of 2-3  son no pets   ? No falls .  Has smoke detector and wears seat belts. POsitive  firearms. No excess sun exposure.   ? 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  ? Had time of nono insuranced and cannot affort 800 per month for catastrophic   ? Stopped tobacco fall 2017 . ocass etoh.  No rec drugs.  ? Daughter patient in our practice   Valerie Love  ? ?Social Determinants of Health  ? ?Financial Resource Strain: Low Risk   ? Difficulty of Paying Living Expenses: Not hard at all  ?Food Insecurity: No Food Insecurity  ? Worried About Programme researcher, broadcasting/film/video in the Last Year: Never true  ? Ran Out of Food in the Last Year: Never true  ?Transportation Needs: No Transportation Needs  ? Lack of Transportation (Medical): No  ? Lack of Transportation (Non-Medical): No  ?Physical Activity: Sufficiently Active  ? Days of Exercise per Week: 3 days  ? Minutes of Exercise per Session: 60 min  ?Stress: No Stress Concern Present  ? Feeling of Stress : Not at all  ?Social Connections: Moderately Integrated  ? Frequency of Communication with Friends and Family: More than three times a week  ? Frequency of Social Gatherings with Friends and Family: More than three times a week  ? Attends Religious Services: More than 4 times per year  ? Active Member of Clubs or Organizations: Yes  ? Attends Banker Meetings: More than 4 times per year  ? Marital Status: Widowed  ? ? ?Outpatient Medications Prior to Visit  ?Medication Sig Dispense Refill  ? albuterol (VENTOLIN HFA) 108 (90 Base) MCG/ACT inhaler Inhale 2 puffs into the lungs every 6 (six) hours as needed for  wheezing or shortness of breath. 8.5 g 1  ? ALPRAZolam (XANAX) 0.5 MG tablet  TAKE 1 TABLET BY MOUTH THREE TIMES A DAY AS NEEDED FOR ANXIETY. MAXIMUM 3 TALBETS PER 24 HOURS 30 tablet 0  ? Calcium Carb-Cholecalciferol (CALCIUM+D3) 600-800 MG-UNIT TABS Take 1 tablet by mouth daily with breakfast.    ? citalopram (CELEXA) 20 MG tablet Take 1 tablet by mouth once daily 90 tablet 0  ? diphenhydrAMINE (BENADRYL) 25 MG tablet Take 25 mg by mouth at bedtime as needed for allergies or sleep.     ? gabapentin (NEURONTIN) 100 MG capsule TAKE 1 CAPSULE BY MOUTH EVERY NIGHT AT BEDTIME FOR 7 DAYS THEN TAKE 1 CAPSULE TWICE DAILY FOR 7 DAYS THEN TAKE 1 CAPSULE THREE TIMES A DAY 90 capsule 0  ? metoprolol tartrate (LOPRESSOR) 25 MG tablet Take 1 tablet (25 mg total) by mouth 2 (two) times daily. 180 tablet 3  ? Multiple Vitamins-Minerals (ALIVE ONCE DAILY WOMENS 50+) TABS Take 1 tablet by mouth daily.    ? naproxen sodium (ANAPROX) 220 MG tablet Take 440 mg by mouth every morning.     ? nystatin (MYCOSTATIN/NYSTOP) powder Apply 1 application topically 3 (three) times daily. 120 g 1  ? nystatin-triamcinolone (MYCOLOG II) cream Apply 1 application topically 2 (two) times daily as needed. 45 g 0  ? Probiotic Product (TRUBIOTICS) CAPS Take 1 capsule by mouth daily.    ? predniSONE (DELTASONE) 50 MG tablet Take one tablet by mouth once daily for 5 days. 5 tablet 0  ? rosuvastatin (CRESTOR) 5 MG tablet Take 1 tablet (5 mg total) by mouth daily. 90 tablet 3  ? ?Facility-Administered Medications Prior to Visit  ?Medication Dose Route Frequency Provider Last Rate Last Admin  ? methylPREDNISolone acetate (DEPO-MEDROL) injection 80 mg  80 mg Other Once Tyrell Antonio, MD      ? ? ? ?EXAM: ? ?BP 132/84 (BP Location: Left Arm, Patient Position: Sitting, Cuff Size: Normal)   Pulse (!) 52   Temp 98.5 ?F (36.9 ?C) (Oral)   Ht 5\' 1"  (1.549 m)   Wt 154 lb 3.2 oz (69.9 kg)   SpO2 98%   BMI 29.14 kg/m?  ? ?Body mass index is 29.14 kg/m?. ?Wt  Readings from Last 3 Encounters:  ?08/31/21 154 lb 3.2 oz (69.9 kg)  ?08/11/21 157 lb 9.6 oz (71.5 kg)  ?06/22/21 156 lb 6.4 oz (70.9 kg)  ? ? ?Physical Exam: ?Vital signs reviewed ?06/24/21 is a well-developed well-

## 2021-08-31 ENCOUNTER — Encounter: Payer: PPO | Admitting: Rehabilitative and Restorative Service Providers"

## 2021-08-31 ENCOUNTER — Encounter: Payer: Self-pay | Admitting: Internal Medicine

## 2021-08-31 ENCOUNTER — Ambulatory Visit (INDEPENDENT_AMBULATORY_CARE_PROVIDER_SITE_OTHER): Payer: PPO | Admitting: Internal Medicine

## 2021-08-31 VITALS — BP 132/84 | HR 52 | Temp 98.5°F | Ht 61.0 in | Wt 154.2 lb

## 2021-08-31 DIAGNOSIS — M479 Spondylosis, unspecified: Secondary | ICD-10-CM

## 2021-08-31 DIAGNOSIS — Z79899 Other long term (current) drug therapy: Secondary | ICD-10-CM

## 2021-08-31 DIAGNOSIS — F411 Generalized anxiety disorder: Secondary | ICD-10-CM

## 2021-08-31 DIAGNOSIS — I2510 Atherosclerotic heart disease of native coronary artery without angina pectoris: Secondary | ICD-10-CM

## 2021-08-31 DIAGNOSIS — Z0000 Encounter for general adult medical examination without abnormal findings: Secondary | ICD-10-CM

## 2021-08-31 NOTE — Patient Instructions (Signed)
Good to see you today . ? ?Contact cardiology about appt.  ? ?Plan fu visit  6-12 months depending on how doing .  ?Exam is good today .  ?

## 2021-09-01 ENCOUNTER — Other Ambulatory Visit: Payer: Self-pay | Admitting: Internal Medicine

## 2021-09-01 DIAGNOSIS — Z1231 Encounter for screening mammogram for malignant neoplasm of breast: Secondary | ICD-10-CM

## 2021-09-03 ENCOUNTER — Encounter: Payer: PPO | Admitting: Rehabilitative and Restorative Service Providers"

## 2021-09-06 ENCOUNTER — Encounter: Payer: Self-pay | Admitting: Rehabilitative and Restorative Service Providers"

## 2021-09-06 ENCOUNTER — Ambulatory Visit: Payer: PPO | Admitting: Physical Medicine and Rehabilitation

## 2021-09-06 NOTE — Procedures (Signed)
Cervical Epidural Steroid Injection - Interlaminar Approach with Fluoroscopic Guidance ? ?Patient: Valerie Love      ?Date of Birth: [birth year] ?MRN: [id number] ?PCP: Madelin Headings, MD      ?Visit Date: 08/25/2021 ?  ?Universal Protocol:    ?Date/Time: 05/01/235:28 AM ? ?Consent Given By: the patient ? ?Position: PRONE ? ?Additional Comments: ?Vital signs were monitored before and after the procedure. ?Patient was prepped and draped in the usual sterile fashion. ?The correct patient, procedure, and site was verified. ? ? ?Injection Procedure Details:  ? ?Procedure diagnoses: Radiculopathy, cervical region [M54.12]   ? ?Meds Administered:  ?Meds ordered this encounter  ?Medications  ? methylPREDNISolone acetate (DEPO-MEDROL) injection 80 mg  ?  ? ?Laterality: Right ? ?Location/Site: C7-T1 ? ?Needle: 3.5 in., 20 ga. Tuohy ? ?Needle Placement: Paramedian epidural space ? ?Findings: ? -Comments: Excellent flow of contrast into the epidural space. ? ?Procedure Details: ?Using a paramedian approach from the side mentioned above, the region overlying the inferior lamina was localized under fluoroscopic visualization and the soft tissues overlying this structure were infiltrated with 4 ml. of 1% Lidocaine without Epinephrine. A # 20 gauge, Tuohy needle was inserted into the epidural space using a paramedian approach. ? ?The epidural space was localized using loss of resistance along with contralateral oblique bi-planar fluoroscopic views.  After negative aspirate for air, blood, and CSF, a 2 ml. volume of Isovue-250 was injected into the epidural space and the flow of contrast was observed. Radiographs were obtained for documentation purposes.  ? ?The injectate was administered into the level noted above. ? ?Additional Comments:  ?The patient tolerated the procedure well ?Dressing: 2 x 2 sterile gauze and Band-Aid ?  ? ?Post-procedure details: ?Patient was observed during the procedure. ?Post-procedure instructions  were reviewed. ? ?Patient left the clinic in stable condition. ?

## 2021-09-06 NOTE — Progress Notes (Signed)
? ?Ardyce Heyer - 66 y.o. female MRN [id number]  Date of birth: [birth year] ? ?Office Visit Note: ?Visit Date: 08/25/2021 ?PCP: Madelin Headings, MD ?Referred by: Madelin Headings, MD ? ?Subjective: ?Chief Complaint  ?Patient presents with  ? Neck - Pain  ? ?HPI:  Valerie Love is a 66 y.o. female who comes in today for planned repeat Right C7-T1  Cervical Interlaminar epidural steroid injection with fluoroscopic guidance.  The patient has failed conservative care including home exercise, medications, time and activity modification.  This injection will be diagnostic and hopefully therapeutic.  Please see requesting physician notes for further details and justification. Patient received more than 50% pain relief from prior injection.  ? ?Referring: Barnie Del, FN-P ? ?ROS Otherwise per HPI. ? ?Assessment & Plan: ?Visit Diagnoses:  ?  ICD-10-CM   ?1. Radiculopathy, cervical region  M54.12 XR C-ARM NO REPORT  ?  Epidural Steroid injection  ?  methylPREDNISolone acetate (DEPO-MEDROL) injection 80 mg  ?  ?  ?Plan: No additional findings.  ? ?Meds & Orders:  ?Meds ordered this encounter  ?Medications  ? methylPREDNISolone acetate (DEPO-MEDROL) injection 80 mg  ?  ?Orders Placed This Encounter  ?Procedures  ? XR C-ARM NO REPORT  ? Epidural Steroid injection  ?  ?Follow-up: Return if symptoms worsen or fail to improve.  ? ?Procedures: ?No procedures performed  ?Cervical Epidural Steroid Injection - Interlaminar Approach with Fluoroscopic Guidance ? ?Patient: Valerie Love      ?Date of Birth: [birth year] ?MRN: [id number] ?PCP: Madelin Headings, MD      ?Visit Date: 08/25/2021 ?  ?Universal Protocol:    ?Date/Time: 05/01/235:28 AM ? ?Consent Given By: the patient ? ?Position: PRONE ? ?Additional Comments: ?Vital signs were monitored before and after the procedure. ?Patient was prepped and draped in the usual sterile fashion. ?The correct patient, procedure, and site was verified. ? ? ?Injection Procedure Details:   ? ?Procedure diagnoses: Radiculopathy, cervical region [M54.12]   ? ?Meds Administered:  ?Meds ordered this encounter  ?Medications  ? methylPREDNISolone acetate (DEPO-MEDROL) injection 80 mg  ?  ? ?Laterality: Right ? ?Location/Site: C7-T1 ? ?Needle: 3.5 in., 20 ga. Tuohy ? ?Needle Placement: Paramedian epidural space ? ?Findings: ? -Comments: Excellent flow of contrast into the epidural space. ? ?Procedure Details: ?Using a paramedian approach from the side mentioned above, the region overlying the inferior lamina was localized under fluoroscopic visualization and the soft tissues overlying this structure were infiltrated with 4 ml. of 1% Lidocaine without Epinephrine. A # 20 gauge, Tuohy needle was inserted into the epidural space using a paramedian approach. ? ?The epidural space was localized using loss of resistance along with contralateral oblique bi-planar fluoroscopic views.  After negative aspirate for air, blood, and CSF, a 2 ml. volume of Isovue-250 was injected into the epidural space and the flow of contrast was observed. Radiographs were obtained for documentation purposes.  ? ?The injectate was administered into the level noted above. ? ?Additional Comments:  ?The patient tolerated the procedure well ?Dressing: 2 x 2 sterile gauze and Band-Aid ?  ? ?Post-procedure details: ?Patient was observed during the procedure. ?Post-procedure instructions were reviewed. ? ?Patient left the clinic in stable condition.  ? ?Clinical History: ?EXAM: ?MRI CERVICAL SPINE WITHOUT CONTRAST ?  ?TECHNIQUE: ?Multiplanar, multisequence MR imaging of the cervical spine was ?performed. No intravenous contrast was administered. ?  ?COMPARISON:  Radiographs of the cervical spine 03/16/2020 (images ?available, report unavailable). ?  ?FINDINGS: ?Mild intermittent motion  degradation. ?  ?Alignment: Reversal of the expected cervical lordosis. 2 mm C2-C3 ?grade 1 anterolisthesis. 2-3 mm C3-C4 grade 1 anterolisthesis. ?   ?Vertebrae: Vertebral body height is maintained. Mild degenerative ?endplate edema at X1-G6, C5-C6, C6-C7 and C7-T1. Marrow edema within ?the bilateral posterior elements at C2-C3 and on the left at C3-C4, ?likely degenerative and related to facet arthrosis. ?  ?Cord: No signal abnormality identified within the cervical spinal ?cord ?  ?Posterior Fossa, vertebral arteries, paraspinal tissues: No ?abnormality identified within included portions of the posterior ?fossa. Flow voids preserved within the imaged cervical vertebral ?arteries. Paraspinal soft tissues unremarkable. ?  ?Disc levels: ?  ?Multilevel disc space narrowing, greatest at C4-C5 (moderate), C5-C6 ?(moderate/advanced), C6-C7 (moderate/advanced) and C7-T1 ?(moderate/advanced). ?  ?C2-C3: Grade 1 anterolisthesis. Slight disc uncovering. Facet ?arthrosis on the right. No significant spinal canal or foraminal ?stenosis. ?  ?C3-C4: Grade 1 anterolisthesis. Slight disc uncovering and disc ?bulge. Uncovertebral hypertrophy on the left. Facet arthrosis ?(greater on the left and fairly advanced on the left). No ?significant spinal canal stenosis. Severe left neural foraminal ?narrowing. ?  ?C4-C5: Disc bulge. Endplate spurring with bilateral disc osteophyte ?ridge/uncinate hypertrophy. A disc bulge effaces the ventral thecal ?sac, contacting and minimally flattening the ventral spinal cord. ?However, the dorsal CSF space is maintained within the spinal canal. ?Bilateral neural foraminal narrowing (mild right, severe left). ?  ?C5-C6: Disc bilateral disc osteophyte ridge/uncinate hypertrophy. ?Mild-to-moderate spinal canal stenosis. The disc bulge contacts and ?minimally flattens the ventral aspect of the spinal cord. Bilateral ?neural foraminal narrowing (severe right, mild left). ?  ?C6-C7: Disc bulge. Bilateral disc osteophyte ridge/uncinate ?hypertrophy. Mild spinal canal stenosis (without significant spinal ?cord mass effect). Severe bilateral neural  foraminal narrowing. ?  ?C7-T1: Disc bulge. Right-sided disc osteophyte ridge. Facet ?arthrosis. No significant spinal canal stenosis. Mild right neural ?foraminal narrowing. ?  ?IMPRESSION: ?Cervical spondylosis, as outlined and with findings most notably as ?follows. ?  ?At C5-C6, there is moderate/advanced disc degeneration. Disc bulge. ?Bilateral disc osteophyte ridge/uncinate hypertrophy. Mild to ?moderate spinal canal narrowing. The disc bulge contacts and ?minimally flattens the ventral aspect of the spinal cord. Bilateral ?neural foraminal narrowing (severe right, mild left). ?  ?At C4-C5, there is moderate disc degeneration. Disc bulge with ?bilateral disc osteophyte ridge/uncinate hypertrophy. The disc bulge ?contacts and minimally flattens the ventral spinal cord. However, ?the dorsal CSF space is maintained within the spinal canal. ?Bilateral neural foraminal narrowing (mild right, severe left). ?  ?No more than mild spinal canal narrowing at the remaining levels. ?Additional sites of foraminal stenosis, as detailed and greatest on ?the left at C3-C4 (severe) and bilaterally at C6-C7 (severe). ?  ?Also of note, disc degeneration is moderate/advanced at C6-C7 and ?C7-T1. ?  ?Degenerative marrow edema as described. ?  ?Nonspecific reversal of expected cervical lordosis. Grade 1 ?anterolisthesis at C2-C3 and C3-C4. ?  ?  ?Electronically Signed ?  By: Jackey Loge D.O. ?  On: 07/15/2021 07:41  ? ? ? ?Objective:  VS:  HT:    WT:   BMI:     BP:(!) 125/57  HR:67bpm  TEMP: ( )  RESP:  ?Physical Exam ?Vitals and nursing note reviewed.  ?Constitutional:   ?   General: She is not in acute distress. ?   Appearance: Normal appearance. She is not ill-appearing.  ?HENT:  ?   Head: Normocephalic and atraumatic.  ?   Right Ear: External ear normal.  ?   Left Ear: External ear normal.  ?Eyes:  ?  Extraocular Movements: Extraocular movements intact.  ?Cardiovascular:  ?   Rate and Rhythm: Normal rate.  ?   Pulses:  Normal pulses.  ?Musculoskeletal:  ?   Cervical back: Tenderness present. No rigidity.  ?   Right lower leg: No edema.  ?   Left lower leg: No edema.  ?   Comments: Patient has good strength in the upper extremities includi

## 2021-09-07 ENCOUNTER — Encounter: Payer: PPO | Admitting: Rehabilitative and Restorative Service Providers"

## 2021-09-09 ENCOUNTER — Encounter: Payer: PPO | Admitting: Rehabilitative and Restorative Service Providers"

## 2021-09-14 ENCOUNTER — Encounter: Payer: PPO | Admitting: Rehabilitative and Restorative Service Providers"

## 2021-09-16 ENCOUNTER — Encounter: Payer: PPO | Admitting: Rehabilitative and Restorative Service Providers"

## 2021-09-21 ENCOUNTER — Encounter: Payer: PPO | Admitting: Rehabilitative and Restorative Service Providers"

## 2021-10-05 ENCOUNTER — Ambulatory Visit
Admission: RE | Admit: 2021-10-05 | Discharge: 2021-10-05 | Disposition: A | Discharge status: Home / Self Care | Payer: PPO | Source: Ambulatory Visit | Attending: Internal Medicine | Admitting: Internal Medicine

## 2021-10-05 DIAGNOSIS — Z1231 Encounter for screening mammogram for malignant neoplasm of breast: Secondary | ICD-10-CM | POA: Diagnosis not present

## 2021-10-12 ENCOUNTER — Ambulatory Visit: Payer: PPO | Admitting: Cardiovascular Disease

## 2021-10-12 ENCOUNTER — Encounter: Payer: Self-pay | Admitting: Cardiovascular Disease

## 2021-10-12 DIAGNOSIS — I5021 Acute systolic (congestive) heart failure: Secondary | ICD-10-CM

## 2021-10-12 DIAGNOSIS — R002 Palpitations: Secondary | ICD-10-CM

## 2021-10-12 DIAGNOSIS — R9431 Abnormal electrocardiogram [ECG] [EKG]: Secondary | ICD-10-CM

## 2021-10-12 DIAGNOSIS — E782 Mixed hyperlipidemia: Secondary | ICD-10-CM | POA: Diagnosis not present

## 2021-10-12 NOTE — Assessment & Plan Note (Signed)
History of mild LV dysfunction in the past with an EF in the 40 to 45% range which ultimately improved up to 50 to 55% by her most recent echo performed 5//21.

## 2021-10-12 NOTE — Patient Instructions (Signed)

## 2021-10-12 NOTE — Assessment & Plan Note (Signed)
Left bundle branch block is chronic

## 2021-10-12 NOTE — Progress Notes (Signed)
10/12/2021 Valerie Love   [birth year]  [id number]  Primary Physician Panosh, Neta Mends, MD Primary Cardiologist: Runell Gess MD Milagros Loll, [place], [place]  HPI:  Valerie Love is a 66 y.o.  widowed Caucasian female mother of 3 children, grandmother of 3 grandchildren who I last saw in the office 10/06/2020.Marland Kitchen She was hospitalized 10/30/2018 for 3 days for a non-STEMI, chest pain and what was presumably PSVT.  She does have left bundle branch block.  She apparently had a Myoview stress test performed 5/12 revealing EF of 48% with fixed septal defect consistent with bundle branch block artifact.  She does not smoke.  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  During her hospitalization her troponins rose to about 1000.  Based on this she underwent diagnostic coronary angiography 10/31/2018 which was entirely normal with normal coronary arteries.  EF was 40 to 45% with septal dyssynchrony.  She was seen by Dr. Elberta Fortis for EP evaluation who placed her on a beta-blocker.  Since discharge she has been doing her normal work routine, walks and works in her garden without significant palpitations.  She says the beta-blocker has improved this.  If she has recurrent prolonged symptoms we talked about the possibility of loop recorder implantation.  She did have an event monitor which resulted 12/19/2018 that showed only occasional PVCs.   Since I saw her in the office a year ago she continues to do well.  She gets occasional tachypalpitations but has known PACs, PVCs and PSVT on low-dose beta-blocker.  She denies chest pain or shortness of breath.  Current Meds  Medication Sig   albuterol (VENTOLIN HFA) 108 (90 Base) MCG/ACT inhaler Inhale 2 puffs into the lungs every 6 (six) hours as needed for wheezing or shortness of breath.   ALPRAZolam (XANAX) 0.5 MG tablet TAKE 1 TABLET BY MOUTH THREE TIMES A DAY AS NEEDED FOR ANXIETY. MAXIMUM 3 TALBETS PER 24 HOURS   Calcium Carb-Cholecalciferol  (CALCIUM+D3) 600-800 MG-UNIT TABS Take 1 tablet by mouth daily with breakfast.   citalopram (CELEXA) 20 MG tablet Take 1 tablet by mouth once daily   diphenhydrAMINE (BENADRYL) 25 MG tablet Take 25 mg by mouth at bedtime as needed for allergies or sleep.    metoprolol tartrate (LOPRESSOR) 25 MG tablet Take 1 tablet (25 mg total) by mouth 2 (two) times daily.   Multiple Vitamins-Minerals (ALIVE ONCE DAILY WOMENS 50+) TABS Take 1 tablet by mouth daily.   naproxen sodium (ANAPROX) 220 MG tablet Take 440 mg by mouth every morning.    nystatin (MYCOSTATIN/NYSTOP) powder Apply 1 application topically 3 (three) times daily.   nystatin-triamcinolone (MYCOLOG II) cream Apply 1 application topically 2 (two) times daily as needed.   Probiotic Product (TRUBIOTICS) CAPS Take 1 capsule by mouth daily.   rosuvastatin (CRESTOR) 5 MG tablet Take 1 tablet (5 mg total) by mouth daily.     Allergies  Allergen Reactions   Codeine Itching, Swelling, Rash and Other (See Comments)   Lactose Intolerance (Gi) Diarrhea    Social History   Socioeconomic History   Marital status: Widowed    Spouse name: Not on file   Number of children: 3   Years of education: Not on file   Highest education level: Not on file  Occupational History   Occupation: self    Comment: Catering and cleaning  Tobacco Use   Smoking status: Former    Packs/day: 0.25    Years: 40.00  Pack years: 10.00    Types: Cigarettes    Quit date: 01/06/2016    Years since quitting: 5.7   Smokeless tobacco: Never   Tobacco comments:    none in about 1 week  Substance and Sexual Activity   Alcohol use: Yes    Alcohol/week: 0.0 standard drinks    Comment: socially-wine- daily   Drug use: No   Sexual activity: Not on file  Other Topics Concern   Not on file  Social History Narrative   Husband passed away on 03-03-2008 hep c and cirrhosis.   She is hep c negative 2006   G3P3   HH of 2-3  son no pets    No falls .  Has smoke detector and  wears seat belts. POsitive  firearms. No excess sun exposure.    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   Had time of nono insuranced and cannot affort 800 per month for catastrophic    Stopped tobacco fall 2017 . ocass etoh.  No rec drugs.   Daughter patient in our practice   [name]   Social Determinants of Health   Financial Resource Strain: Low Risk    Difficulty of Paying Living Expenses: Not hard at all  Food Insecurity: No Food Insecurity   Worried About Programme researcher, broadcasting/film/video in the Last Year: Never true   Barista in the Last Year: Never true  Transportation Needs: No Transportation Needs   Lack of Transportation (Medical): No   Lack of Transportation (Non-Medical): No  Physical Activity: Sufficiently Active   Days of Exercise per Week: 3 days   Minutes of Exercise per Session: 60 min  Stress: No Stress Concern Present   Feeling of Stress : Not at all  Social Connections: Moderately Integrated   Frequency of Communication with Friends and Family: More than three times a week   Frequency of Social Gatherings with Friends and Family: More than three times a week   Attends Religious Services: More than 4 times per year   Active Member of [employer] or Organizations: Yes   Attends Banker Meetings: More than 4 times per year   Marital Status: Widowed  Catering manager Violence: Not At Risk   Fear of Current or Ex-Partner: No   Emotionally Abused: No   Physically Abused: No   Sexually Abused: No     Review of Systems: General: negative for chills, fever, night sweats or weight changes.  Cardiovascular: negative for chest pain, dyspnea on exertion, edema, orthopnea, palpitations, paroxysmal nocturnal dyspnea or shortness of breath Dermatological: negative for rash Respiratory: negative for cough or wheezing Urologic: negative for hematuria Abdominal: negative for nausea, vomiting, diarrhea, bright red blood per rectum, melena, or  hematemesis Neurologic: negative for visual changes, syncope, or dizziness All other systems reviewed and are otherwise negative except as noted above.    Blood pressure 130/66, pulse 62, height 5\' 2"  (1.575 m), weight 156 lb 6.4 oz (70.9 kg), SpO2 97 %.  General appearance: alert and no distress Neck: no adenopathy, no carotid bruit, no JVD, supple, symmetrical, trachea midline, and thyroid not enlarged, symmetric, no tenderness/mass/nodules Lungs: clear to auscultation bilaterally Heart: regular rate and rhythm, S1, S2 normal, no murmur, click, rub or gallop Extremities: extremities normal, atraumatic, no cyanosis or edema Pulses: 2+ and symmetric Skin: Skin color, texture, turgor normal. No rashes or lesions Neurologic: Grossly normal  EKG sinus rhythm at 62 with left  bundle branch block.  I personally reviewed this EKG.  ASSESSMENT AND PLAN:   Palpitations History of palpitations in the past with event monitoring that showed PVCs and PACs improved on low-dose beta-blocker.  EKG, abnormal Left bundle branch block is chronic  Hyperlipidemia History of hyperlipidemia on statin therapy with lipid profile performed 06/22/2021 revealing total cholesterol of 194, LDL of 75 and HDL of 80.  Acute systolic heart failure (HCC) History of mild LV dysfunction in the past with an EF in the 40 to 45% range which ultimately improved up to 50 to 55% by her most recent echo performed 5//21.     Runell Gess MD FACP,FACC,FAHA, [facility] 10/12/2021 10:22 AM

## 2021-10-12 NOTE — Assessment & Plan Note (Signed)
History of palpitations in the past with event monitoring that showed PVCs and PACs improved on low-dose beta-blocker.

## 2021-10-12 NOTE — Assessment & Plan Note (Signed)
History of hyperlipidemia on statin therapy with lipid profile performed 06/22/2021 revealing total cholesterol of 194, LDL of 75 and HDL of 80.

## 2021-11-09 ENCOUNTER — Encounter (HOSPITAL_COMMUNITY): Payer: Self-pay | Admitting: Emergency Medicine

## 2021-11-09 ENCOUNTER — Emergency Department (HOSPITAL_COMMUNITY): Payer: PPO

## 2021-11-09 ENCOUNTER — Other Ambulatory Visit: Payer: Self-pay

## 2021-11-09 ENCOUNTER — Inpatient Hospital Stay (HOSPITAL_COMMUNITY)
Admission: EM | Admit: 2021-11-09 | Discharge: 2021-11-11 | DRG: 309 | Disposition: A | Discharge status: Home / Self Care | Payer: PPO | Attending: Cardiovascular Disease | Admitting: Cardiovascular Disease

## 2021-11-09 DIAGNOSIS — I252 Old myocardial infarction: Secondary | ICD-10-CM | POA: Diagnosis not present

## 2021-11-09 DIAGNOSIS — I428 Other cardiomyopathies: Secondary | ICD-10-CM | POA: Diagnosis not present

## 2021-11-09 DIAGNOSIS — Z885 Allergy status to narcotic agent status: Secondary | ICD-10-CM

## 2021-11-09 DIAGNOSIS — F419 Anxiety disorder, unspecified: Secondary | ICD-10-CM | POA: Diagnosis not present

## 2021-11-09 DIAGNOSIS — M419 Scoliosis, unspecified: Secondary | ICD-10-CM | POA: Diagnosis present

## 2021-11-09 DIAGNOSIS — I499 Cardiac arrhythmia, unspecified: Secondary | ICD-10-CM | POA: Diagnosis not present

## 2021-11-09 DIAGNOSIS — Z8371 Family history of colonic polyps: Secondary | ICD-10-CM

## 2021-11-09 DIAGNOSIS — E739 Lactose intolerance, unspecified: Secondary | ICD-10-CM | POA: Diagnosis present

## 2021-11-09 DIAGNOSIS — Z823 Family history of stroke: Secondary | ICD-10-CM

## 2021-11-09 DIAGNOSIS — I471 Supraventricular tachycardia: Secondary | ICD-10-CM | POA: Diagnosis not present

## 2021-11-09 DIAGNOSIS — I447 Left bundle-branch block, unspecified: Secondary | ICD-10-CM | POA: Diagnosis not present

## 2021-11-09 DIAGNOSIS — R079 Chest pain, unspecified: Secondary | ICD-10-CM | POA: Diagnosis not present

## 2021-11-09 DIAGNOSIS — Z79899 Other long term (current) drug therapy: Secondary | ICD-10-CM

## 2021-11-09 DIAGNOSIS — Z8249 Family history of ischemic heart disease and other diseases of the circulatory system: Secondary | ICD-10-CM

## 2021-11-09 DIAGNOSIS — I4891 Unspecified atrial fibrillation: Secondary | ICD-10-CM

## 2021-11-09 DIAGNOSIS — I480 Paroxysmal atrial fibrillation: Principal | ICD-10-CM | POA: Diagnosis present

## 2021-11-09 DIAGNOSIS — Z96651 Presence of right artificial knee joint: Secondary | ICD-10-CM | POA: Diagnosis present

## 2021-11-09 DIAGNOSIS — Z8679 Personal history of other diseases of the circulatory system: Secondary | ICD-10-CM

## 2021-11-09 DIAGNOSIS — Z87891 Personal history of nicotine dependence: Secondary | ICD-10-CM

## 2021-11-09 DIAGNOSIS — R0789 Other chest pain: Secondary | ICD-10-CM | POA: Diagnosis not present

## 2021-11-09 DIAGNOSIS — I248 Other forms of acute ischemic heart disease: Secondary | ICD-10-CM | POA: Diagnosis not present

## 2021-11-09 LAB — CBC
HCT: 38.6 % (ref 36.0–46.0)
Hemoglobin: 13.1 g/dL (ref 12.0–15.0)
MCH: 31.8 pg (ref 26.0–34.0)
MCHC: 33.9 g/dL (ref 30.0–36.0)
MCV: 93.7 fL (ref 80.0–100.0)
Platelets: 225 10*3/uL (ref 150–400)
RBC: 4.12 MIL/uL (ref 3.87–5.11)
RDW: 12.9 % (ref 11.5–15.5)
WBC: 6.6 10*3/uL (ref 4.0–10.5)
nRBC: 0 % (ref 0.0–0.2)

## 2021-11-09 LAB — BASIC METABOLIC PANEL
Anion gap: 10 (ref 5–15)
BUN: 23 mg/dL (ref 8–23)
CO2: 23 mmol/L (ref 22–32)
Calcium: 8.9 mg/dL (ref 8.9–10.3)
Chloride: 108 mmol/L (ref 98–111)
Creatinine, Ser: 0.75 mg/dL (ref 0.44–1.00)
GFR, Estimated: >60 mL/min (ref >60)
Glucose, Bld: 131 mg/dL — ABNORMAL HIGH (ref 70–99)
Potassium: 3.8 mmol/L (ref 3.5–5.1)
Sodium: 141 mmol/L (ref 135–145)

## 2021-11-09 LAB — MAGNESIUM: Magnesium: 1.9 mg/dL (ref 1.7–2.4)

## 2021-11-09 LAB — TROPONIN I (HIGH SENSITIVITY)
Troponin I (High Sensitivity): 5 ng/L (ref <18)
Troponin I (High Sensitivity): 34 ng/L — ABNORMAL HIGH (ref <18)

## 2021-11-09 LAB — BRAIN NATRIURETIC PEPTIDE: B Natriuretic Peptide: 109.9 pg/mL — ABNORMAL HIGH (ref 0.0–100.0)

## 2021-11-09 LAB — CBG MONITORING, ED: Glucose-Capillary: 130 mg/dL — ABNORMAL HIGH (ref 70–99)

## 2021-11-09 MED ORDER — ASPIRIN 81 MG PO CHEW: 324.0000 mg | CHEWABLE_TABLET | Freq: Once | ORAL | Status: DC

## 2021-11-09 MED ORDER — ACETAMINOPHEN 325 MG PO TABS
650.0000 mg | ORAL_TABLET | ORAL | Status: DC | PRN
  Administered 2021-11-09 – 2021-11-10 (×2): 650 mg via ORAL
  Filled 2021-11-09 (×2): qty 2

## 2021-11-09 MED ORDER — APIXABAN 5 MG PO TABS
5.0000 mg | ORAL_TABLET | Freq: Two times a day (BID) | ORAL | Status: DC
Start: 2021-11-09 — End: 2021-11-11
  Administered 2021-11-09 – 2021-11-11 (×4): 5 mg via ORAL
  Filled 2021-11-09 (×4): qty 1

## 2021-11-09 MED ORDER — DILTIAZEM LOAD VIA INFUSION
10.0000 mg | Freq: Once | INTRAVENOUS | Status: AC
Start: 2021-11-09 — End: 2021-11-09
  Administered 2021-11-09: 10 mg via INTRAVENOUS
  Filled 2021-11-09: qty 10

## 2021-11-09 MED ORDER — DILTIAZEM HCL 25 MG/5ML IV SOLN
10.0000 mg | Freq: Once | INTRAVENOUS | Status: AC
  Administered 2021-11-09: 10 mg via INTRAVENOUS
  Filled 2021-11-09: qty 5

## 2021-11-09 MED ORDER — SODIUM CHLORIDE 0.9 % IV BOLUS
1000.0000 mL | Freq: Once | INTRAVENOUS | Status: AC
  Administered 2021-11-09: 1000 mL via INTRAVENOUS

## 2021-11-09 MED ORDER — DILTIAZEM HCL-DEXTROSE 125-5 MG/125ML-% IV SOLN (PREMIX)
5.0000 mg/h | INTRAVENOUS | Status: DC
  Administered 2021-11-09: 5 mg/h via INTRAVENOUS
  Filled 2021-11-09 (×2): qty 125

## 2021-11-09 MED ORDER — ONDANSETRON HCL 4 MG/2ML IJ SOLN: 4.0000 mg | Freq: Four times a day (QID) | INTRAMUSCULAR | Status: DC | PRN

## 2021-11-09 NOTE — ED Provider Notes (Signed)
[facility] EMERGENCY DEPARTMENT Provider Note   CSN: [id number] Arrival date & time: 11/09/21  1929     History  Chief complaint: Tachycardia, chest pain  Valerie Love is a 66 y.o. female.  HPI  Has a history of left bundle branch block, NSTEMI, CHF, normal cardiac catheterization who presents with complaints of chest pain and palpitations.  Patient states she had sudden onset of symptoms earlier this evening of heart racing, jaw discomfort chest pressure.  Patient felt that her symptoms were similar to when she had her non-ST elevation myocardial infarction.  Patient called EMS.  EMS noted wide-complex tachycardia.  Possible atrial fibrillation with aberrancy.  There was some question of possible V. tach.  Patient denies any fevers or chills.  No vomiting or diarrhea  Home Medications Prior to Admission medications   Medication Sig Start Date End Date Taking? Authorizing Provider  albuterol (VENTOLIN HFA) 108 (90 Base) MCG/ACT inhaler Inhale 2 puffs into the lungs every 6 (six) hours as needed for wheezing or shortness of breath. 04/15/21  Yes Panosh, Neta Mends, MD  ALPRAZolam (XANAX) 0.5 MG tablet TAKE 1 TABLET BY MOUTH THREE TIMES A DAY AS NEEDED FOR ANXIETY. MAXIMUM 3 TALBETS PER 24 HOURS Patient taking differently: Take 0.5 mg by mouth 3 (three) times daily as needed for anxiety. MAXIMUM 3 TALBETS PER 24 HOURS 06/09/21  Yes Panosh, Neta Mends, MD  Calcium Carb-Cholecalciferol (CALCIUM+D3) 600-800 MG-UNIT TABS Take 1 tablet by mouth daily with breakfast.   Yes [provider]  citalopram (CELEXA) 20 MG tablet Take 1 tablet by mouth once daily 08/24/21  Yes Panosh, Neta Mends, MD  diphenhydrAMINE (BENADRYL) 25 MG tablet Take 25 mg by mouth at bedtime as needed for allergies or sleep.    Yes [provider]  metoprolol tartrate (LOPRESSOR) 25 MG tablet Take 1 tablet (25 mg total) by mouth 2 (two) times daily. 12/21/20  Yes Runell Gess, MD  Multiple  Vitamins-Minerals (ALIVE ONCE DAILY WOMENS 50+) TABS Take 1 tablet by mouth daily.   Yes [provider]  naproxen sodium (ANAPROX) 220 MG tablet Take 440 mg by mouth every morning.    Yes [provider]  Probiotic Product (TRUBIOTICS) CAPS Take 1 capsule by mouth daily.   Yes [provider]  rosuvastatin (CRESTOR) 5 MG tablet Take 5 mg by mouth every evening.   Yes [provider]  nystatin (MYCOSTATIN/NYSTOP) powder Apply 1 application topically 3 (three) times daily. Patient not taking: Reported on 11/09/2021 06/22/21   Panosh, Neta Mends, MD  nystatin-triamcinolone (MYCOLOG II) cream Apply 1 application topically 2 (two) times daily as needed. Patient not taking: Reported on 11/09/2021 06/22/21   Panosh, Neta Mends, MD  rosuvastatin (CRESTOR) 5 MG tablet Take 1 tablet (5 mg total) by mouth daily. 04/26/21 10/12/21  Runell Gess, MD      Allergies    Codeine and Lactose intolerance (gi)    Review of Systems   Review of Systems  Physical Exam Updated Vital Signs BP 114/87   Pulse (!) 113   Temp 97.6 F (36.4 C) (Oral)   Resp 17   Ht 1.575 m (5\' 2" )   Wt 71.2 kg   SpO2 96%   BMI 28.72 kg/m  Physical Exam Vitals and nursing note reviewed.  Constitutional:      General: She is not in acute distress.    Appearance: She is well-developed.  HENT:     Head: Normocephalic and atraumatic.  Right Ear: External ear normal.     Left Ear: External ear normal.  Eyes:     General: No scleral icterus.       Right eye: No discharge.        Left eye: No discharge.     Conjunctiva/sclera: Conjunctivae normal.  Neck:     Trachea: No tracheal deviation.  Cardiovascular:     Rate and Rhythm: Tachycardia present. Rhythm irregular.  Pulmonary:     Effort: Pulmonary effort is normal. No respiratory distress.     Breath sounds: Normal breath sounds. No stridor. No wheezing or rales.  Abdominal:     General: Bowel sounds are normal. There is no distension.      Palpations: Abdomen is soft.     Tenderness: There is no abdominal tenderness. There is no guarding or rebound.  Musculoskeletal:        General: No tenderness or deformity.     Cervical back: Neck supple.  Skin:    General: Skin is warm and dry.     Findings: No rash.  Neurological:     General: No focal deficit present.     Mental Status: She is alert.     Cranial Nerves: No cranial nerve deficit (no facial droop, extraocular movements intact, no slurred speech).     Sensory: No sensory deficit.     Motor: No abnormal muscle tone or seizure activity.     Coordination: Coordination normal.  Psychiatric:        Mood and Affect: Mood normal.     ED Results / Procedures / Treatments   Labs (all labs ordered are listed, but only abnormal results are displayed) Labs Reviewed  BASIC METABOLIC PANEL - Abnormal; Notable for the following components:      Result Value   Glucose, Bld 131 (*)    All other components within normal limits  CBG MONITORING, ED - Abnormal; Notable for the following components:   Glucose-Capillary 130 (*)    All other components within normal limits  CBC  MAGNESIUM  TSH  BRAIN NATRIURETIC PEPTIDE  BASIC METABOLIC PANEL  TROPONIN I (HIGH SENSITIVITY)  TROPONIN I (HIGH SENSITIVITY)    EKG EKG Interpretation  Date/Time:  Tuesday November 09 2021 20:10:31 EDT Ventricular Rate:  118 PR Interval:  200 QRS Duration: 135 QT Interval:  408 QTC Calculation: 572 R Axis:   24 Text Interpretation: Atrial fibrillation Ventricular premature complex Left bundle branch block Since last tracing rate faster Confirmed by Linwood Dibbles [id number]) on 11/09/2021 8:12:53 PM  Radiology DG Chest Portable 1 View  Result Date: 11/09/2021 CLINICAL DATA:  Chest pain. EXAM: PORTABLE CHEST 1 VIEW COMPARISON:  October 30, 2018 FINDINGS: Multiple overlying radiopaque cardiac lead wires are seen. The heart size and mediastinal contours are within normal limits. Both lungs are clear. The  visualized skeletal structures are unremarkable. IMPRESSION: No active cardiopulmonary disease. Electronically Signed   By: Aram Candela M.D.   On: 11/09/2021 20:26    Procedures .Critical Care  Performed by: Linwood Dibbles, MD Authorized by: Linwood Dibbles, MD   Critical care provider statement:    Critical care time (minutes):  30   Critical care was time spent personally by me on the following activities:  Development of treatment plan with patient or surrogate, discussions with consultants, evaluation of patient's response to treatment, examination of patient, ordering and review of laboratory studies, ordering and review of radiographic studies, ordering and performing treatments and interventions, pulse oximetry, re-evaluation of patient's  condition and review of old charts     Medications Ordered in ED Medications  aspirin chewable tablet 324 mg (324 mg Oral Not Given 11/09/21 1949)  diltiazem (CARDIZEM) 1 mg/mL load via infusion 10 mg (10 mg Intravenous Bolus from Bag 11/09/21 2056)    And  diltiazem (CARDIZEM) 125 mg in dextrose 5% 125 mL (1 mg/mL) infusion (10 mg/hr Intravenous Rate/Dose Change 11/09/21 2234)  acetaminophen (TYLENOL) tablet 650 mg (650 mg Oral Given 11/09/21 2233)  ondansetron (ZOFRAN) injection 4 mg (has no administration in time range)  apixaban (ELIQUIS) tablet 5 mg (5 mg Oral Given 11/09/21 2223)  sodium chloride 0.9 % bolus 1,000 mL (0 mLs Intravenous Stopped 11/09/21 2222)  diltiazem (CARDIZEM) injection 10 mg (10 mg Intravenous Given 11/09/21 2004)    ED Course/ Medical Decision Making/ A&P Clinical Course as of 11/09/21 2253  Tue Nov 09, 2021  2012 Heart rate decreased to the 118s after dose of Cardizem.  Patient states her jaw discomfort also resolved [JK]  2253 Basic metabolic panel(!) [JK]  2253 Normal [JK]  2253 Troponin I (High Sensitivity) Normal [JK]  2253 CBC Normal [JK]  2253 DG Chest Portable 1 View No acute abnormalities [JK]    Clinical Course User  Index [JK] Linwood Dibbles, MD       CHA2DS2-VASc Score: 3                    Medical Decision Making Problems Addressed: Atrial fibrillation with rapid ventricular response [facility]): acute illness or injury that poses a threat to life or bodily functions  Amount and/or Complexity of Data Reviewed Labs: ordered. Decision-making details documented in ED Course. Radiology: ordered and independent interpretation performed. Decision-making details documented in ED Course.  Risk OTC drugs. Prescription drug management. Drug therapy requiring intensive monitoring for toxicity. Decision regarding hospitalization.   Patient presented with acute onset of chest pain.  Concerned about the possibility of recurrent NSTEMI.  EKG shows A-fib with rapid ventricular rate.  Patient was started on Cardizem IV infusion with good response.  Her symptoms improved and chest pain was decreased.  Initial troponin is normal.  No findings to suggest cardiac ischemia.  Case discussed with cardiology.  Patient admitted for further treatment        Final Clinical Impression(s) / ED Diagnoses Final diagnoses:  Atrial fibrillation with rapid ventricular response [facility])    Rx / DC Orders ED Discharge Orders          Ordered    Amb referral to AFIB Clinic        11/09/21 2156              Linwood Dibbles, MD 11/09/21 2254

## 2021-11-09 NOTE — ED Triage Notes (Signed)
[facility] due to chest pain that radiates bilaterally to her jaw today 11/09/21 around 1745.  Pt endorses nausea with no vomiting.    Hx of MI, irregular heartbeat and left bundle branch block.   VS BP 90's, heart rate 130-210.  18 g left AC.  NS.  4mg  of Zofran.  324mg  of aspiring administered.

## 2021-11-10 ENCOUNTER — Telehealth (HOSPITAL_COMMUNITY): Payer: Self-pay | Admitting: Pharmacy Technician

## 2021-11-10 ENCOUNTER — Other Ambulatory Visit (HOSPITAL_COMMUNITY): Payer: Self-pay

## 2021-11-10 ENCOUNTER — Observation Stay (HOSPITAL_COMMUNITY): Payer: PPO

## 2021-11-10 DIAGNOSIS — Z885 Allergy status to narcotic agent status: Secondary | ICD-10-CM | POA: Diagnosis not present

## 2021-11-10 DIAGNOSIS — Z8371 Family history of colonic polyps: Secondary | ICD-10-CM | POA: Diagnosis not present

## 2021-11-10 DIAGNOSIS — I428 Other cardiomyopathies: Secondary | ICD-10-CM | POA: Diagnosis present

## 2021-11-10 DIAGNOSIS — I471 Supraventricular tachycardia: Secondary | ICD-10-CM | POA: Diagnosis present

## 2021-11-10 DIAGNOSIS — I4891 Unspecified atrial fibrillation: Secondary | ICD-10-CM

## 2021-11-10 DIAGNOSIS — Z96651 Presence of right artificial knee joint: Secondary | ICD-10-CM | POA: Diagnosis present

## 2021-11-10 DIAGNOSIS — I248 Other forms of acute ischemic heart disease: Secondary | ICD-10-CM | POA: Diagnosis present

## 2021-11-10 DIAGNOSIS — Z8679 Personal history of other diseases of the circulatory system: Secondary | ICD-10-CM | POA: Diagnosis not present

## 2021-11-10 DIAGNOSIS — Z823 Family history of stroke: Secondary | ICD-10-CM | POA: Diagnosis not present

## 2021-11-10 DIAGNOSIS — I447 Left bundle-branch block, unspecified: Secondary | ICD-10-CM | POA: Diagnosis present

## 2021-11-10 DIAGNOSIS — F419 Anxiety disorder, unspecified: Secondary | ICD-10-CM | POA: Diagnosis present

## 2021-11-10 DIAGNOSIS — I252 Old myocardial infarction: Secondary | ICD-10-CM | POA: Diagnosis not present

## 2021-11-10 DIAGNOSIS — Z8249 Family history of ischemic heart disease and other diseases of the circulatory system: Secondary | ICD-10-CM | POA: Diagnosis not present

## 2021-11-10 DIAGNOSIS — E739 Lactose intolerance, unspecified: Secondary | ICD-10-CM | POA: Diagnosis present

## 2021-11-10 DIAGNOSIS — M419 Scoliosis, unspecified: Secondary | ICD-10-CM | POA: Diagnosis present

## 2021-11-10 DIAGNOSIS — I480 Paroxysmal atrial fibrillation: Secondary | ICD-10-CM | POA: Diagnosis present

## 2021-11-10 DIAGNOSIS — Z87891 Personal history of nicotine dependence: Secondary | ICD-10-CM | POA: Diagnosis not present

## 2021-11-10 DIAGNOSIS — Z79899 Other long term (current) drug therapy: Secondary | ICD-10-CM | POA: Diagnosis not present

## 2021-11-10 LAB — BASIC METABOLIC PANEL
Anion gap: 5 (ref 5–15)
BUN: 18 mg/dL (ref 8–23)
CO2: 24 mmol/L (ref 22–32)
Calcium: 8.4 mg/dL — ABNORMAL LOW (ref 8.9–10.3)
Chloride: 113 mmol/L — ABNORMAL HIGH (ref 98–111)
Creatinine, Ser: 0.68 mg/dL (ref 0.44–1.00)
GFR, Estimated: >60 mL/min (ref >60)
Glucose, Bld: 103 mg/dL — ABNORMAL HIGH (ref 70–99)
Potassium: 4 mmol/L (ref 3.5–5.1)
Sodium: 142 mmol/L (ref 135–145)

## 2021-11-10 LAB — TSH: TSH: 2.478 u[IU]/mL (ref 0.350–4.500)

## 2021-11-10 LAB — ECHOCARDIOGRAM COMPLETE
Area-P 1/2: 3.13 cm2
Height: 61 in
S' Lateral: 2.5 cm
Weight: 2508.66 oz

## 2021-11-10 MED ORDER — DILTIAZEM HCL ER COATED BEADS 120 MG PO CP24
120.0000 mg | ORAL_CAPSULE | Freq: Every day | ORAL | Status: DC
  Administered 2021-11-10 – 2021-11-11 (×2): 120 mg via ORAL
  Filled 2021-11-10 (×2): qty 1

## 2021-11-10 MED ORDER — ROSUVASTATIN CALCIUM 5 MG PO TABS
5.0000 mg | ORAL_TABLET | Freq: Every day | ORAL | Status: DC
  Administered 2021-11-10 – 2021-11-11 (×2): 5 mg via ORAL
  Filled 2021-11-10 (×2): qty 1

## 2021-11-10 MED ORDER — ALPRAZOLAM 0.5 MG PO TABS
0.5000 mg | ORAL_TABLET | Freq: Three times a day (TID) | ORAL | Status: DC | PRN
Start: 2021-11-10 — End: 2021-11-11
  Administered 2021-11-10: 0.5 mg via ORAL
  Filled 2021-11-10: qty 1

## 2021-11-10 MED ORDER — NAPROXEN SODIUM 275 MG PO TABS
275.0000 mg | ORAL_TABLET | Freq: Every morning | ORAL | Status: DC
Start: 2021-11-10 — End: 2021-11-10
  Filled 2021-11-10: qty 1

## 2021-11-10 MED ORDER — DIPHENHYDRAMINE HCL 25 MG PO CAPS: 25.0000 mg | ORAL_CAPSULE | Freq: Every evening | ORAL | Status: DC | PRN

## 2021-11-10 MED ORDER — MAGNESIUM SULFATE 2 GM/50ML IV SOLN
2.0000 g | Freq: Once | INTRAVENOUS | Status: AC
  Administered 2021-11-10: 2 g via INTRAVENOUS
  Filled 2021-11-10: qty 50

## 2021-11-10 MED ORDER — DILTIAZEM HCL 90 MG PO TABS
45.0000 mg | ORAL_TABLET | Freq: Four times a day (QID) | ORAL | Status: DC
Start: 2021-11-10 — End: 2021-11-10

## 2021-11-10 MED ORDER — DILTIAZEM HCL-DEXTROSE 125-5 MG/125ML-% IV SOLN (PREMIX)
5.0000 mg/h | INTRAVENOUS | Status: DC
  Administered 2021-11-10: 5 mg/h via INTRAVENOUS

## 2021-11-10 MED ORDER — CITALOPRAM HYDROBROMIDE 20 MG PO TABS
20.0000 mg | ORAL_TABLET | Freq: Every day | ORAL | Status: DC
  Administered 2021-11-10: 20 mg via ORAL
  Filled 2021-11-10 (×2): qty 1

## 2021-11-10 NOTE — TOC Benefit Eligibility Note (Signed)
Patient Advocate Encounter  Insurance verification completed.    The patient is currently admitted and upon discharge could be taking Eliquis 5 mg.  The current 30 day co-pay is, $45.00.   The patient is insured through [employer] Part D     Sevana Grandinetti, CPhT Pharmacy Patient Advocate Specialist [facility] Patient Advocate Team Direct Number: [phone]  Fax: [phone]        

## 2021-11-10 NOTE — Telephone Encounter (Signed)
Pharmacy Patient Advocate Encounter  Insurance verification completed.    The patient is insured through [employer] Part D   The patient is currently admitted and ran test claims for the following: Eliquis 5 mg.  Copays and coinsurance results were relayed to Inpatient clinical team.      

## 2021-11-10 NOTE — Progress Notes (Signed)
Progress Note  Patient Name: Valerie Love Date of Encounter: 11/10/2021  [facility] Cardiologist: Nanetta Batty, MD  Subjective   HR and BP improved on Cardizem 5mg /hr. Still feels fluttering sensation. No room for TEE/DCCV today.   Inpatient Medications    Scheduled Meds:  apixaban  5 mg Oral BID   aspirin  324 mg Oral Once   citalopram  20 mg Oral Daily   naproxen sodium  275 mg Oral q morning   rosuvastatin  5 mg Oral Daily   Continuous Infusions:  diltiazem (CARDIZEM) infusion 5 mg/hr (11/10/21 0431)   magnesium sulfate bolus IVPB     PRN Meds: acetaminophen, ALPRAZolam, diphenhydrAMINE, ondansetron (ZOFRAN) IV   Vital Signs    Vitals:   11/09/21 2300 11/09/21 2330 11/10/21 0239 11/10/21 0400  BP:  122/89  95/60  Pulse: (!) 115 84  82  Resp: 18 16  17   Temp:  97.7 F (36.5 C)  98 F (36.7 C)  TempSrc:  Oral  Oral  SpO2: 96% 95%  94%  Weight:  71.1 kg 71.1 kg   Height:  5\' 1"  (1.549 m)     No intake or output data in the 24 hours ending 11/10/21 0830    11/10/2021    2:39 AM 11/09/2021   11:30 PM 11/09/2021    7:48 PM  Last 3 Weights  Weight (lbs) 156 lb 12.7 oz 156 lb 12.8 oz 157 lb  Weight (kg) 71.12 kg 71.124 kg 71.215 kg      Telemetry    Atrial fibrillation at HR of  - Personally Reviewed  ECG    No new tracing   Physical Exam   GEN: No acute distress.   Neck: No JVD Cardiac: IR IR, no murmurs, rubs, or gallops.  Respiratory: Clear to auscultation bilaterally. GI: Soft, nontender, non-distended  MS: No edema; No deformity. Neuro:  Nonfocal  Psych: Normal affect   Labs    High Sensitivity Troponin:   Recent Labs  Lab 11/09/21 1942 11/09/21 2200  TROPONINIHS 5 34*     Chemistry Recent Labs  Lab 11/09/21 1942 11/10/21 0012  NA 141 142  K 3.8 4.0  CL 108 113*  CO2 23 24  GLUCOSE 131* 103*  BUN 23 18  CREATININE 0.75 0.68  CALCIUM 8.9 8.4*  MG 1.9  --   GFRNONAA >60 >60  ANIONGAP 10 5    Lipids No results for  input(s): "CHOL", "TRIG", "HDL", "LABVLDL", "LDLCALC", "CHOLHDL" in the last 168 hours.  Hematology Recent Labs  Lab 11/09/21 1942  WBC 6.6  RBC 4.12  HGB 13.1  HCT 38.6  MCV 93.7  MCH 31.8  MCHC 33.9  RDW 12.9  PLT 225   Thyroid  Recent Labs  Lab 11/10/21 0012  TSH 2.478    BNP Recent Labs  Lab 11/09/21 2200  BNP 109.9*     Radiology    DG Chest Portable 1 View  Result Date: 11/09/2021 CLINICAL DATA:  Chest pain. EXAM: PORTABLE CHEST 1 VIEW COMPARISON:  October 30, 2018 FINDINGS: Multiple overlying radiopaque cardiac lead wires are seen. The heart size and mediastinal contours are within normal limits. Both lungs are clear. The visualized skeletal structures are unremarkable. IMPRESSION: No active cardiopulmonary disease. Electronically Signed   By: 01/10/22 M.D.   On: 11/09/2021 20:26    Cardiac Studies   Pending echo   Patient Profile     66 y.o. female with history of pSVT, MINOCA 2020 (normal  cors), HFmREF now recovered, known LBBB and anxiety presenting with atrial fibrillation with rapid ventricular response.   Assessment & Plan    Atria fibrillation with RVR - HR improved to 90s on Carizem load and drip at 5mg /hr. Symptoms improving. No TEE/DCCV room today.  - Could consider changing to PO cardizem - TSH normal - Pending echo - Fellow recommended cMRI at some point given LBBB and unusual hx - Continue Eliquis - Plan outpatient DCCV after 3 weeks of anticoagulation vs TEE/DCCV this admission   2. Elevated troponin  - Due to demand ischemia   For questions or updates, please contact [facility] Please consult [email or web address] for contact info under        Signed, , PA  11/10/2021, 8:30 AM

## 2021-11-10 NOTE — H&P (Signed)
Cardiology Admission History and Physical:   Patient ID: Valerie Love MRN: [id number]; DOB: 03-16-1956   Admission date: 11/09/2021  PCP:  Madelin Headings, MD   [facility] Providers Cardiologist:  Nanetta Batty, MD        Chief Complaint:  Palpitations, jaw pain  Patient Profile:   Valerie Love is a 66 y.o. female with history of pSVT, MINOCA 2020 (normal cors), HFmREF now recovered, known LBBB who is being seen 11/10/2021 for the evaluation of atrial fibrillation.  History of Present Illness:   Valerie Love felt abruptly poor this evening and went with racing heart, weakness, and jaw discomfort and went to the fire department down the street where she was found to be in atrial fibrillation with rate as high as 200 and was brought to our ED via EMS.  She was given a bolus of diltiazem and started on an infusion which controlled her rates.  Her acute symptoms resolved with rate control.  She does report having paroxysms of tachycardia with generalized fatigue over the last few months.  These have been increasing in frequency but self terminated within an hour or so.  She has felt more generally fatigued.  She has noticed bendopnea but no significant exertional dyspnea, orthopnea, PND, or peripheral edema.  She continues to work cleaning houses.  She is accompanied by her daughter and son who are attentive to the interview.  They have noticed some degree of fatigue over the last few months as well.  She is a former smoker that quit in 2017.  She drinks a glass of wine daily and had one today.  She does not use drugs.  Her father, grandfather, and uncle had heart failure but she remembers that her father had an MI when she was young.  She has not been screened for OSA.  She has issues with scoliosis and osteoarthritis but has never had carpal tunnel syndromes and denies neuropathy.  She presented in 2021 with tachcyardia and jaw pain and mounted a troponin to 1000 and underwent  invasive angiography which showed normal coronaries.  Her LVEF was mildly depressed but subsequently normalized on beta blocker therapy.  Only paroxysmal SVT had been observed to date.  Past Medical History:  Diagnosis Date   Anxiety    Arthritis    R knee, back, hands    Cellulitis of knee 04/2016   RT KNEE   Cellulitis of right knee 04/18/2016   Childhood asthma    Complication of anesthesia    woke up slowly - 52yrs. ago   Depression    related to husband illness and death   History of jaundice    as a child     Hx of varicella    Hyperthyroidism    during pregnancy, treated & resolved post partum    IBS (irritable bowel syndrome)    LBBB (left bundle branch block)    Scoliosis    Scoliosis 07/2015   Thyroid disease in pregnancy    TOBACCO DEPENDENCE 07/06/2006   Qualifier: Diagnosis of  By: Knox Royalty     Troponin level elevated     Past Surgical History:  Procedure Laterality Date   BREAST BIOPSY Right 1979   benign    Cyst removed from ovary     KNEE SURGERY     right duda    LEFT HEART CATH AND CORONARY ANGIOGRAPHY N/A 10/31/2018   Procedure: LEFT HEART CATH AND CORONARY ANGIOGRAPHY;  Surgeon: Lennette Bihari, MD;  Location: [facility] INVASIVE CV LAB;  Service: Cardiovascular;  Laterality: N/A;   TONSILLECTOMY AND ADENOIDECTOMY  1073   TOTAL KNEE ARTHROPLASTY 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   Procedure: TOTAL KNEE ARTHROPLASTY;  Surgeon: [name], MD;  Location: [facility] OR;  Service: Orthopedics;  Laterality: Right;   TUBAL LIGATION     WISDOM TOOTH EXTRACTION       Medications Prior to Admission: Prior to Admission medications   Medication Sig Start Date End Date Taking? Authorizing Provider  albuterol (VENTOLIN HFA) 108 (90 Base) MCG/ACT inhaler Inhale 2 puffs into the lungs every 6 (six) hours as needed for wheezing or shortness of breath. 04/15/21  Yes Panosh, Neta Mends, MD  ALPRAZolam (XANAX) 0.5 MG tablet TAKE 1 TABLET BY MOUTH THREE TIMES A DAY AS NEEDED FOR ANXIETY. MAXIMUM 3  TALBETS PER 24 HOURS Patient taking differently: Take 0.5 mg by mouth 3 (three) times daily as needed for anxiety. MAXIMUM 3 TALBETS PER 24 HOURS 06/09/21  Yes Panosh, Neta Mends, MD  Calcium Carb-Cholecalciferol (CALCIUM+D3) 600-800 MG-UNIT TABS Take 1 tablet by mouth daily with breakfast.   Yes [provider]  citalopram (CELEXA) 20 MG tablet Take 1 tablet by mouth once daily 08/24/21  Yes Panosh, Neta Mends, MD  diphenhydrAMINE (BENADRYL) 25 MG tablet Take 25 mg by mouth at bedtime as needed for allergies or sleep.    Yes [provider]  metoprolol tartrate (LOPRESSOR) 25 MG tablet Take 1 tablet (25 mg total) by mouth 2 (two) times daily. 12/21/20  Yes Runell Gess, MD  Multiple Vitamins-Minerals (ALIVE ONCE DAILY WOMENS 50+) TABS Take 1 tablet by mouth daily.   Yes [provider]  naproxen sodium (ANAPROX) 220 MG tablet Take 440 mg by mouth every morning.    Yes [provider]  Probiotic Product (TRUBIOTICS) CAPS Take 1 capsule by mouth daily.   Yes [provider]  rosuvastatin (CRESTOR) 5 MG tablet Take 5 mg by mouth every evening.   Yes [provider]  nystatin (MYCOSTATIN/NYSTOP) powder Apply 1 application topically 3 (three) times daily. Patient not taking: Reported on 11/09/2021 06/22/21   Panosh, Neta Mends, MD  nystatin-triamcinolone (MYCOLOG II) cream Apply 1 application topically 2 (two) times daily as needed. Patient not taking: Reported on 11/09/2021 06/22/21   Panosh, Neta Mends, MD  rosuvastatin (CRESTOR) 5 MG tablet Take 1 tablet (5 mg total) by mouth daily. 04/26/21 10/12/21  Runell Gess, MD     Allergies:    Allergies  Allergen Reactions   Codeine Itching, Swelling, Rash and Other (See Comments)    Hives and Vomiting   Lactose Intolerance (Gi) Diarrhea    Social History:   Social History   Socioeconomic History   Marital status: Widowed    Spouse name: Not on file   Number of children: 3   Years of education: Not on  file   Highest education level: Not on file  Occupational History   Occupation: self    Comment: Catering and cleaning  Tobacco Use   Smoking status: Former    Packs/day: 0.25    Years: 40.00    Total pack years: 10.00    Types: Cigarettes    Quit date: 01/06/2016    Years since quitting: 5.8   Smokeless tobacco: Never   Tobacco comments:    none in about 1 week  Substance and Sexual Activity   Alcohol use: Yes    Alcohol/week: 0.0 standard drinks of alcohol    Comment: socially-wine- daily  Drug use: No   Sexual activity: Not on file  Other Topics Concern   Not on file  Social History Narrative   Husband passed away on 03-10-08 hep c and cirrhosis.   She is hep c negative 2006   G3P3   HH of 2-3  son no pets    No falls .  Has smoke detector and wears seat belts. POsitive  firearms. No excess sun exposure.    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   Had time of nono insuranced and cannot affort 800 per month for catastrophic    Stopped tobacco fall 2017 . ocass etoh.  No rec drugs.   Daughter patient in our practice   [name]   Social Determinants of Health   Financial Resource Strain: Low Risk  (08/11/2021)   Overall Financial Resource Strain (CARDIA)    Difficulty of Paying Living Expenses: Not hard at all  Food Insecurity: No Food Insecurity (08/11/2021)   Hunger Vital Sign    Worried About Running Out of Food in the Last Year: Never true    Ran Out of Food in the Last Year: Never true  Transportation Needs: No Transportation Needs (08/11/2021)   PRAPARE - Administrator, Civil Service (Medical): No    Lack of Transportation (Non-Medical): No  Physical Activity: Sufficiently Active (08/11/2021)   Exercise Vital Sign    Days of Exercise per Week: 3 days    Minutes of Exercise per Session: 60 min  Stress: No Stress Concern Present (08/11/2021)   [employer] of Occupational Health - Occupational Stress Questionnaire    Feeling  of Stress : Not at all  Social Connections: Moderately Integrated (08/11/2021)   Social Connection and Isolation Panel [NHANES]    Frequency of Communication with Friends and Family: More than three times a week    Frequency of Social Gatherings with Friends and Family: More than three times a week    Attends Religious Services: More than 4 times per year    Active Member of [employer] or Organizations: Yes    Attends Banker Meetings: More than 4 times per year    Marital Status: Widowed  Intimate Partner Violence: Not At Risk (08/11/2021)   Humiliation, Afraid, Rape, and Kick questionnaire    Fear of Current or Ex-Partner: No    Emotionally Abused: No    Physically Abused: No    Sexually Abused: No    Family History:  The patient's family history includes Alcohol abuse in her mother; Colon polyps in her sister; Coronary artery disease in her father; Hepatitis C in her brother; Hyperlipidemia in her father; Hypertension in her father; Stroke in her father. There is no history of Colon cancer.    ROS:  Please see the history of present illness.  All other ROS reviewed and negative.     Physical Exam/Data:   Vitals:   11/09/21 2215 11/09/21 2245 11/09/21 2300 11/09/21 2330  BP: 114/87 102/79  122/89  Pulse: (!) 113 71 (!) 115 84  Resp: 17 20 18 16   Temp:    97.7 F (36.5 C)  TempSrc:    Oral  SpO2: 96% 96% 96% 95%  Weight:    71.1 kg  Height:    5\' 1"  (1.549 m)   No intake or output data in the 24 hours ending 11/10/21 0132    11/09/2021   11:30 PM 11/09/2021    7:48 PM 10/12/2021  9:58 AM  Last 3 Weights  Weight (lbs) 156 lb 12.8 oz 157 lb 156 lb 6.4 oz  Weight (kg) 71.124 kg 71.215 kg 70.943 kg     Body mass index is 29.63 kg/m.  General:  Well nourished, well developed, in no acute distress HEENT: normal Neck: JVP ~ 8 cm H20 and augments with AJR Vascular: No carotid bruits; Distal pulses 2+ bilaterally   Cardiac:  normal S1, S2, irregularly irregular, no  MGR Lungs:  clear to auscultation bilaterally, no wheezing, rhonchi or rales  Abd: soft, nontender, no hepatomegaly  Ext: no edema Musculoskeletal:  No deformities, BUE and BLE strength normal and equal Skin: warm and dry  Neuro:  CNs 2-12 intact, no focal abnormalities noted Psych:  Normal affect    EKG:  The ECG that was done today was personally reviewed and demonstrates AF with known LBBB  Relevant CV Studies: Per HPI  Laboratory Data:  High Sensitivity Troponin:   Recent Labs  Lab 11/09/21 1942 11/09/21 2200  TROPONINIHS 5 34*      Chemistry Recent Labs  Lab 11/09/21 1942  NA 141  K 3.8  CL 108  CO2 23  GLUCOSE 131*  BUN 23  CREATININE 0.75  CALCIUM 8.9  MG 1.9  GFRNONAA >60  ANIONGAP 10    No results for input(s): "PROT", "ALBUMIN", "AST", "ALT", "ALKPHOS", "BILITOT" in the last 168 hours. Lipids No results for input(s): "CHOL", "TRIG", "HDL", "LABVLDL", "LDLCALC", "CHOLHDL" in the last 168 hours. Hematology Recent Labs  Lab 11/09/21 1942  WBC 6.6  RBC 4.12  HGB 13.1  HCT 38.6  MCV 93.7  MCH 31.8  MCHC 33.9  RDW 12.9  PLT 225   Thyroid No results for input(s): "TSH", "FREET4" in the last 168 hours. BNP Recent Labs  Lab 11/09/21 2200  BNP 109.9*    DDimer No results for input(s): "DDIMER" in the last 168 hours.   Radiology/Studies:  DG Chest Portable 1 View  Result Date: 11/09/2021 CLINICAL DATA:  Chest pain. EXAM: PORTABLE CHEST 1 VIEW COMPARISON:  October 30, 2018 FINDINGS: Multiple overlying radiopaque cardiac lead wires are seen. The heart size and mediastinal contours are within normal limits. Both lungs are clear. The visualized skeletal structures are unremarkable. IMPRESSION: No active cardiopulmonary disease. Electronically Signed   By: Aram Candela M.D.   On: 11/09/2021 20:26     Assessment and Plan:   66 year old female with history of HFmREF subsequently recovered, MINOCA 2020, pSVT presenting with atrial fibrillation with  rapid ventricular response.  Symptoms are relieved with rate control but I think she would benefit from rhythm control up front and discussed TEE/DCCV to which she is amenable.  Underlying LBBB, prior reduced LVEF and mild HFpEF symptoms make 1C less desirable.  Reasonable to see how she does and discuss AAD following this admission.  I will exclude AL amyloid with screening labs but she probably deserves a cardiac MRI at some point given the unusual history.  #pAF with rapid ventricular response, C2V at least 3 - Eliquis 5 mg BID - NPO for TEE DCCV - Continue diltiazem to cardioversion; can transition back to beta blocker, intensify if tolerated for backup rate control - Screen OSA as outpatient - Should cut back on the wine  #Hx NICM, MINOCA - Back to beta blocker as above - ? Utility of cMRI; defer to primary cardiologist - FLC, SPEP - Continue low dose statin  #Mild troponin elevation - Suspect mismatch in setting of  significant tachcyardia, symptoms absent with rate control  #Anxiety - Citalopram 20 mg daily - Alprazolam PRN; she uses rarely reportedly  Risk Assessment/Risk Scores:       New York Heart Association (NYHA) Functional Class NYHA Class II  CHA2DS2-VASc Score =   3        Severity of Illness: The appropriate patient status for this patient is OBSERVATION. Observation status is judged to be reasonable and necessary in order to provide the required intensity of service to ensure the patient's safety. The patient's presenting symptoms, physical exam findings, and initial radiographic and laboratory data in the context of their medical condition is felt to place them at decreased risk for further clinical deterioration. Furthermore, it is anticipated that the patient will be medically stable for discharge from the hospital within 2 midnights of admission.    For questions or updates, please contact [facility] Please consult [email or web address] for contact info under      Signed, Harlon Ditty Lorane Cousar, MD  11/10/2021 1:32 AM

## 2021-11-10 NOTE — Discharge Instructions (Signed)

## 2021-11-10 NOTE — Progress Notes (Signed)
Echocardiogram 2D Echocardiogram has been performed.  Warren Lacy Seyon Strader RDCS 11/10/2021, 2:55 PM

## 2021-11-10 NOTE — Care Management (Signed)
  Transition of Care [facility]) Screening Note   Patient Details  Name: Alisyn Lequire Date of Birth: July 22, 1955   [facility]) CM/SW Contact:    Gala Lewandowsky, RN Phone Number: 11/10/2021, 12:46 PM    Transition of Care Department [facility]) has reviewed the patient and no TOC needs have been identified at this time. We will continue to monitor patient advancement through interdisciplinary progression rounds. If new patient transition needs arise, please place a TOC consult.

## 2021-11-11 ENCOUNTER — Encounter (HOSPITAL_COMMUNITY): Payer: Self-pay | Admitting: Student

## 2021-11-11 ENCOUNTER — Other Ambulatory Visit (HOSPITAL_COMMUNITY): Payer: PPO

## 2021-11-11 ENCOUNTER — Telehealth (HOSPITAL_COMMUNITY): Payer: Self-pay | Admitting: Physician Assistant

## 2021-11-11 ENCOUNTER — Other Ambulatory Visit (HOSPITAL_COMMUNITY): Payer: Self-pay

## 2021-11-11 DIAGNOSIS — I4891 Unspecified atrial fibrillation: Secondary | ICD-10-CM | POA: Diagnosis not present

## 2021-11-11 LAB — KAPPA/LAMBDA LIGHT CHAINS
Kappa free light chain: 9.3 mg/L (ref 3.3–19.4)
Kappa, lambda light chain ratio: 0.72 (ref 0.26–1.65)
Lambda free light chains: 13 mg/L (ref 5.7–26.3)

## 2021-11-11 SURGERY — ECHOCARDIOGRAM, TRANSESOPHAGEAL
Anesthesia: General

## 2021-11-11 MED ORDER — ACETAMINOPHEN 325 MG PO TABS
650.0000 mg | ORAL_TABLET | ORAL | Status: DC | PRN
Start: 2021-11-11 — End: 2022-08-11

## 2021-11-11 MED ORDER — APIXABAN 5 MG PO TABS
5.0000 mg | ORAL_TABLET | Freq: Two times a day (BID) | ORAL | 3 refills | Status: DC
  Filled 2021-11-11: qty 180, 90d supply, fill #1
  Filled 2021-11-11: qty 60, 30d supply, fill #0

## 2021-11-11 MED ORDER — DILTIAZEM HCL ER COATED BEADS 120 MG PO CP24
120.0000 mg | ORAL_CAPSULE | Freq: Every day | ORAL | 3 refills | Status: DC
  Filled 2021-11-11: qty 90, 90d supply, fill #0

## 2021-11-11 NOTE — Progress Notes (Signed)
Patient remained in NSR 60-80s. Denies heart palpitations and chest pain. Will continue to monitor.

## 2021-11-11 NOTE — Telephone Encounter (Signed)
Patient is being released from hosptial and needed a f/u

## 2021-11-11 NOTE — Progress Notes (Signed)
Patient has remained in NSR. TEE/Cardioversion will be canceled and patient to discharge home

## 2021-11-11 NOTE — Progress Notes (Signed)
NPO and drinks moved away for patient.

## 2021-11-11 NOTE — Discharge Summary (Signed)
Discharge Summary    Patient ID: Valerie Love MRN: [id number]; DOB: 06/22/1955  Admit date: 11/09/2021 Discharge date: 11/11/2021  PCP:  Valerie Headings, MD   [facility] Providers Cardiologist:  Nanetta Batty, MD   {  Discharge Diagnoses    Principal Problem:   Atrial fibrillation with rapid ventricular response [facility]) Active Problems:   Atrial fibrillation with RVR (HCC)   Diagnostic Studies/Procedures    Echo: 11/10/21  IMPRESSIONS     1. Left ventricular ejection fraction, by estimation, is 55 to 60%. The  left ventricle has normal function. The left ventricle has no regional  wall motion abnormalities. Left ventricular diastolic parameters are  consistent with Grade I diastolic  dysfunction (impaired relaxation).   2. Right ventricular systolic function is normal. The right ventricular  size is normal. There is normal pulmonary artery systolic pressure.   3. The mitral valve is normal in structure. Mild mitral valve  regurgitation. No evidence of mitral stenosis.   4. The aortic valve is normal in structure. Aortic valve regurgitation is  mild. No aortic stenosis is present.   5. The inferior vena cava is dilated in size with >50% respiratory  variability, suggesting right atrial pressure of 8 mmHg.   FINDINGS   Left Ventricle: Left ventricular ejection fraction, by estimation, is 55  to 60%. The left ventricle has normal function. The left ventricle has no  regional wall motion abnormalities. The left ventricular internal cavity  size was normal in size. There is   no left ventricular hypertrophy. Left ventricular diastolic parameters  are consistent with Grade I diastolic dysfunction (impaired relaxation).   Right Ventricle: The right ventricular size is normal. No increase in  right ventricular wall thickness. Right ventricular systolic function is  normal. There is normal pulmonary artery systolic pressure. The tricuspid  regurgitant velocity is 2.44  m/s, and   with an assumed right atrial pressure of 8 mmHg, the estimated right  ventricular systolic pressure is 31.8 mmHg.   Left Atrium: Left atrial size was normal in size.   Right Atrium: Right atrial size was normal in size.   Pericardium: Trivial pericardial effusion is present. The pericardial  effusion is circumferential.   Mitral Valve: The mitral valve is normal in structure. Mild mitral valve  regurgitation. No evidence of mitral valve stenosis.   Tricuspid Valve: The tricuspid valve is normal in structure. Tricuspid  valve regurgitation is trivial. No evidence of tricuspid stenosis.   Aortic Valve: The aortic valve is normal in structure. Aortic valve  regurgitation is mild. No aortic stenosis is present.   Pulmonic Valve: The pulmonic valve was normal in structure. Pulmonic valve  regurgitation is not visualized. No evidence of pulmonic stenosis.   Aorta: The aortic root is normal in size and structure.   Venous: The inferior vena cava is dilated in size with greater than 50%  respiratory variability, suggesting right atrial pressure of 8 mmHg.   IAS/Shunts: No atrial level shunt detected by color flow Doppler.  _____________   History of Present Illness     Valerie Love is a 66 y.o. female with  history of pSVT, MINOCA 2020 (normal cors), HFmREF now recovered, known LBBB who was seen 11/10/2021 for the evaluation of atrial fibrillation.   Ms. Argo felt abruptly poor the evening of admission and went with racing heart, weakness, and jaw discomfort and went to the fire department down the street where she was found to be in atrial fibrillation with rate  as high as 200 and was brought to our ED via EMS.  She was given a bolus of diltiazem and started on an infusion which controlled her rates.  Her acute symptoms resolved with rate control.   She did report having paroxysms of tachycardia with generalized fatigue over the last few months.  These have been  increasing in frequency but self terminated within an hour or so.  She has felt more generally fatigued.  She has noticed bendopnea but no significant exertional dyspnea, orthopnea, PND, or peripheral edema.  She continues to work cleaning houses.  She was accompanied by her daughter and son who are attentive to the interview.  They have noticed some degree of fatigue over the last few months as well.   She is a former smoker that quit in 2017.  She drinks a glass of wine daily. She does not use drugs.  Her father, grandfather, and uncle had heart failure but she remembers that her father had an MI when she was young.  She has not been screened for OSA.  She has issues with scoliosis and osteoarthritis but has never had carpal tunnel syndromes and denies neuropathy.   She presented in 2021 with tachcyardia and jaw pain and mounted a troponin to 1000 and underwent invasive angiography which showed normal coronaries.  Her LVEF was mildly depressed but subsequently normalized on beta blocker therapy.  Only paroxysmal SVT had been observed to date. She was admitted to cardiology for further management.   Hospital Course     Atrial fibrillation with RVR: Initially treated with IV Diltiazem with rates improved. Converted to SR on IV gtt and transitioned to  Diltiazem 120mg  daily. Follow up echo showed LVEF of 55-60% with no rWMA, g1DD, mild MR.  -- continue on Eliquis 5mg  BID   Elevated Troponin: hsTn 5>>34 in the setting of Atrial fib RVR. No anginal symptoms  General: Well developed, well nourished, female appearing in no acute distress. Head: Normocephalic, atraumatic.  Neck: Supple without bruits, JVD. Lungs:  Resp regular and unlabored, CTA. Heart: RRR, S1, S2, no S3, S4, or murmur; no rub. Abdomen: Soft, non-tender, non-distended with normoactive bowel sounds. No hepatomegaly. No rebound/guarding. No obvious abdominal masses. Extremities: No clubbing, cyanosis, edema. Distal pedal pulses are 2+  bilaterally.  Neuro: Alert and oriented X 3. Moves all extremities spontaneously. Psych: Normal affect.   Patient was seen by Dr. and deemed stable for discharge home. Follow up arranged in the Afib clinic. Medications sent to the [facility] pharmacy. Educated by pharmD prior to DC.  Did the patient have an acute coronary syndrome (MI, NSTEMI, STEMI, etc) this admission?:  No                               Did the patient have a percutaneous coronary intervention (stent / angioplasty)?:  No.        The patient will be scheduled for a TOC follow up appointment in 10-14 days.  A message has been sent to the [facility] and Scheduling Pool at the office where the patient should be seen for follow up.  _____________  Discharge Vitals Blood pressure 140/80, pulse 69, temperature 98.3 F (36.8 C), temperature source Oral, resp. rate 16, height 5\' 1"  (1.549 m), weight 69.1 kg, SpO2 95 %.  Filed Weights   11/09/21 2330 11/10/21 0239 11/11/21 0629  Weight: 71.1 kg 71.1 kg 69.1 kg    Labs &  Radiologic Studies    CBC Recent Labs    11/09/21 1942  WBC 6.6  HGB 13.1  HCT 38.6  MCV 93.7  PLT 225   Basic Metabolic Panel Recent Labs    16/07/37 1942 11/10/21 0012  NA 141 142  K 3.8 4.0  CL 108 113*  CO2 23 24  GLUCOSE 131* 103*  BUN 23 18  CREATININE 0.75 0.68  CALCIUM 8.9 8.4*  MG 1.9  --    Liver Function Tests No results for input(s): "AST", "ALT", "ALKPHOS", "BILITOT", "PROT", "ALBUMIN" in the last 72 hours. No results for input(s): "LIPASE", "AMYLASE" in the last 72 hours. High Sensitivity Troponin:   Recent Labs  Lab 11/09/21 1942 11/09/21 2200  TROPONINIHS 5 34*    BNP Invalid input(s): "POCBNP" D-Dimer No results for input(s): "DDIMER" in the last 72 hours. Hemoglobin A1C No results for input(s): "HGBA1C" in the last 72 hours. Fasting Lipid Panel No results for input(s): "CHOL", "HDL", "LDLCALC", "TRIG", "CHOLHDL", "LDLDIRECT" in the last 72 hours. Thyroid  Function Tests Recent Labs    11/10/21 0012  TSH 2.478   _____________  ECHOCARDIOGRAM COMPLETE  Result Date: 11/10/2021    ECHOCARDIOGRAM REPORT   Patient Name:   JOLIYAH LIPPENS Date of Exam: 11/10/2021 Medical Rec #:  [id number]          Height:       61.0 in Accession #:    [id number]         Weight:       156.8 lb Date of Birth:  03-11-1956          BSA:          1.703 m Patient Age:    66 years           BP:           127/83 mmHg Patient Gender: F                  HR:           58 bpm. Exam Location:  Inpatient Procedure: 2D Echo, Color Doppler and Cardiac Doppler Indications:    I48.91* Unspecified atrial fibrillation  History:        Patient has prior history of Echocardiogram examinations, most                 recent 09/10/2019. Arrythmias:Atrial Fibrillation; Risk                 Factors:Dyslipidemia.  Sonographer:    Irving Burton Senior RDCS Referring Phys: [id number] CARRIEL T NIPP IMPRESSIONS  1. Left ventricular ejection fraction, by estimation, is 55 to 60%. The left ventricle has normal function. The left ventricle has no regional wall motion abnormalities. Left ventricular diastolic parameters are consistent with Grade I diastolic dysfunction (impaired relaxation).  2. Right ventricular systolic function is normal. The right ventricular size is normal. There is normal pulmonary artery systolic pressure.  3. The mitral valve is normal in structure. Mild mitral valve regurgitation. No evidence of mitral stenosis.  4. The aortic valve is normal in structure. Aortic valve regurgitation is mild. No aortic stenosis is present.  5. The inferior vena cava is dilated in size with >50% respiratory variability, suggesting right atrial pressure of 8 mmHg. FINDINGS  Left Ventricle: Left ventricular ejection fraction, by estimation, is 55 to 60%. The left ventricle has normal function. The left ventricle has no regional wall motion abnormalities. The left ventricular internal cavity size was normal in  size. There is   no left ventricular hypertrophy. Left ventricular diastolic parameters are consistent with Grade I diastolic dysfunction (impaired relaxation). Right Ventricle: The right ventricular size is normal. No increase in right ventricular wall thickness. Right ventricular systolic function is normal. There is normal pulmonary artery systolic pressure. The tricuspid regurgitant velocity is 2.44 m/s, and  with an assumed right atrial pressure of 8 mmHg, the estimated right ventricular systolic pressure is 31.8 mmHg. Left Atrium: Left atrial size was normal in size. Right Atrium: Right atrial size was normal in size. Pericardium: Trivial pericardial effusion is present. The pericardial effusion is circumferential. Mitral Valve: The mitral valve is normal in structure. Mild mitral valve regurgitation. No evidence of mitral valve stenosis. Tricuspid Valve: The tricuspid valve is normal in structure. Tricuspid valve regurgitation is trivial. No evidence of tricuspid stenosis. Aortic Valve: The aortic valve is normal in structure. Aortic valve regurgitation is mild. No aortic stenosis is present. Pulmonic Valve: The pulmonic valve was normal in structure. Pulmonic valve regurgitation is not visualized. No evidence of pulmonic stenosis. Aorta: The aortic root is normal in size and structure. Venous: The inferior vena cava is dilated in size with greater than 50% respiratory variability, suggesting right atrial pressure of 8 mmHg. IAS/Shunts: No atrial level shunt detected by color flow Doppler.  LEFT VENTRICLE PLAX 2D LVIDd:         3.65 cm   Diastology LVIDs:         2.50 cm   LV e' medial:    7.18 cm/s LV PW:         1.15 cm   LV E/e' medial:  9.3 LV IVS:        0.95 cm   LV e' lateral:   9.46 cm/s LVOT diam:     2.10 cm   LV E/e' lateral: 7.1 LV SV:         95 LV SV Index:   56 LVOT Area:     3.46 cm  RIGHT VENTRICLE RV S prime:     12.60 cm/s TAPSE (M-mode): 1.8 cm LEFT ATRIUM             Index        RIGHT ATRIUM            Index LA diam:        3.80 cm 2.23 cm/m   RA Area:     14.50 cm LA Vol (A2C):   60.4 ml 35.46 ml/m  RA Volume:   34.10 ml  20.02 ml/m LA Vol (A4C):   30.6 ml 17.97 ml/m LA Biplane Vol: 43.9 ml 25.78 ml/m  AORTIC VALVE LVOT Vmax:   129.00 cm/s LVOT Vmean:  92.900 cm/s LVOT VTI:    0.273 m  AORTA Ao Root diam: 3.00 cm MITRAL VALVE               TRICUSPID VALVE MV Area (PHT): 3.13 cm    TR Peak grad:   23.8 mmHg MV Decel Time: 242 msec    TR Vmax:        244.00 cm/s MV E velocity: 67.00 cm/s MV A velocity: 70.40 cm/s  SHUNTS MV E/A ratio:  0.95        Systemic VTI:  0.27 m                            Systemic Diam: 2.10 cm Lavona Mound Tobb DO Electronically signed by Lavona Mound  Tobb DO Signature Date/Time: 11/10/2021/4:20:02 PM    Final    DG Chest Portable 1 View  Result Date: 11/09/2021 CLINICAL DATA:  Chest pain. EXAM: PORTABLE CHEST 1 VIEW COMPARISON:  October 30, 2018 FINDINGS: Multiple overlying radiopaque cardiac lead wires are seen. The heart size and mediastinal contours are within normal limits. Both lungs are clear. The visualized skeletal structures are unremarkable. IMPRESSION: No active cardiopulmonary disease. Electronically Signed   By: Aram Candela M.D.   On: 11/09/2021 20:26    Disposition   Pt is being discharged home today in good condition.  Follow-up Plans & Appointments     Follow-up Information     Fenton, Levonne Spiller R, PA Follow up on 11/16/2021.   Specialty: Cardiology Why: at 2:30pm for your follow up appt in the Afib clinic Contact information: [address] [place] [address] [phone]                Discharge Instructions     Amb referral to AFIB Clinic   Complete by: As directed    Call MD for:  difficulty breathing, headache or visual disturbances   Complete by: As directed    Call MD for:  persistant dizziness or light-headedness   Complete by: As directed    Call MD for:  redness, tenderness, or signs of infection (pain, swelling, redness, odor or  green/yellow discharge around incision site)   Complete by: As directed    Diet - low sodium heart healthy   Complete by: As directed    Discharge instructions   Complete by: As directed    If you notice any bleeding such as blood in stool, black tarry stools, blood in urine, nosebleeds or any other unusual bleeding, call your doctor immediately. It is not normal to have this kind of bleeding while on a blood thinner and usually indicates there is an underlying problem with one of your body systems that needs to be checked out.   Increase activity slowly   Complete by: As directed        Discharge Medications   Allergies as of 11/11/2021       Reactions   Codeine Itching, Swelling, Rash, Other (See Comments)   Hives and Vomiting   Lactose Intolerance (gi) Diarrhea        Medication List     STOP taking these medications    metoprolol tartrate 25 MG tablet Commonly known as: LOPRESSOR   naproxen sodium 220 MG tablet Commonly known as: ALEVE       TAKE these medications    acetaminophen 325 MG tablet Commonly known as: TYLENOL Take 2 tablets (650 mg total) by mouth every 4 (four) hours as needed for headache, mild pain or moderate pain.   albuterol 108 (90 Base) MCG/ACT inhaler Commonly known as: VENTOLIN HFA Inhale 2 puffs into the lungs every 6 (six) hours as needed for wheezing or shortness of breath.   Alive Once Daily Womens 50+ Tabs Take 1 tablet by mouth daily.   ALPRAZolam 0.5 MG tablet Commonly known as: XANAX TAKE 1 TABLET BY MOUTH THREE TIMES A DAY AS NEEDED FOR ANXIETY. MAXIMUM 3 TALBETS PER 24 HOURS What changed:  how much to take how to take this when to take this reasons to take this additional instructions   apixaban 5 MG Tabs tablet Commonly known as: ELIQUIS Take 1 tablet (5 mg total) by mouth 2 (two) times daily.   Calcium+D3 600-20 MG-MCG Tabs Generic drug: Calcium Carb-Cholecalciferol Take  1 tablet by mouth daily with breakfast.    citalopram 20 MG tablet Commonly known as: CELEXA Take 1 tablet by mouth once daily   diltiazem 120 MG 24 hr capsule Commonly known as: CARDIZEM CD Take 1 capsule (120 mg total) by mouth daily.   diphenhydrAMINE 25 MG tablet Commonly known as: BENADRYL Take 25 mg by mouth at bedtime as needed for allergies or sleep.   rosuvastatin 5 MG tablet Commonly known as: CRESTOR Take 1 tablet (5 mg total) by mouth daily.   TruBiotics Caps Take 1 capsule by mouth daily.         Outstanding Labs/Studies   N/a   Duration of Discharge Encounter   Greater than 30 minutes including physician time.  Signed, Laverda Page, NP 11/11/2021, 8:45 AM

## 2021-11-12 LAB — PROTEIN ELECTROPHORESIS, SERUM
A/G Ratio: 1.6 (ref 0.7–1.7)
Albumin ELP: 3.1 g/dL (ref 2.9–4.4)
Alpha-1-Globulin: 0.1 g/dL (ref 0.0–0.4)
Alpha-2-Globulin: 0.5 g/dL (ref 0.4–1.0)
Beta Globulin: 0.8 g/dL (ref 0.7–1.3)
Gamma Globulin: 0.6 g/dL (ref 0.4–1.8)
Globulin, Total: 2 g/dL — ABNORMAL LOW (ref 2.2–3.9)
Total Protein ELP: 5.1 g/dL — ABNORMAL LOW (ref 6.0–8.5)

## 2021-11-13 ENCOUNTER — Other Ambulatory Visit: Payer: Self-pay | Admitting: Internal Medicine

## 2021-11-16 ENCOUNTER — Encounter (HOSPITAL_COMMUNITY): Payer: Self-pay | Admitting: Physician Assistant

## 2021-11-16 ENCOUNTER — Ambulatory Visit (HOSPITAL_COMMUNITY)
Admit: 2021-11-16 | Discharge: 2021-11-16 | Disposition: A | Discharge status: Home / Self Care | Payer: PPO | Attending: Physician Assistant | Admitting: Physician Assistant

## 2021-11-16 VITALS — BP 128/78 | HR 71 | Ht 61.0 in | Wt 149.6 lb

## 2021-11-16 DIAGNOSIS — Z79899 Other long term (current) drug therapy: Secondary | ICD-10-CM | POA: Insufficient documentation

## 2021-11-16 DIAGNOSIS — I5022 Chronic systolic (congestive) heart failure: Secondary | ICD-10-CM | POA: Insufficient documentation

## 2021-11-16 DIAGNOSIS — Z7901 Long term (current) use of anticoagulants: Secondary | ICD-10-CM | POA: Diagnosis not present

## 2021-11-16 DIAGNOSIS — I480 Paroxysmal atrial fibrillation: Secondary | ICD-10-CM

## 2021-11-16 DIAGNOSIS — I4891 Unspecified atrial fibrillation: Secondary | ICD-10-CM | POA: Diagnosis not present

## 2021-11-16 DIAGNOSIS — D6869 Other thrombophilia: Secondary | ICD-10-CM

## 2021-11-16 DIAGNOSIS — E785 Hyperlipidemia, unspecified: Secondary | ICD-10-CM | POA: Diagnosis not present

## 2021-11-16 DIAGNOSIS — I252 Old myocardial infarction: Secondary | ICD-10-CM | POA: Diagnosis not present

## 2021-11-16 MED ORDER — DILTIAZEM HCL ER COATED BEADS 120 MG PO CP24: 120.0000 mg | ORAL_CAPSULE | Freq: Every day | ORAL | 3 refills | Status: DC

## 2021-11-16 MED ORDER — APIXABAN 5 MG PO TABS: 5.0000 mg | ORAL_TABLET | Freq: Two times a day (BID) | ORAL | 3 refills | Status: DC

## 2021-11-16 NOTE — Progress Notes (Signed)
Primary Care Physician: Madelin Headings, MD Primary Cardiologist: Dr Allyson Sabal Primary Electrophysiologist: Dr Elberta Fortis Referring Physician: Dr [place]   Valerie Love is a 66 y.o. female with a history of LV dysfunction, NSTEMI w/ normal coronaries 2020, HLD, atrial fibrillation who presents for follow up in the [facility].  The patient was initially diagnosed with atrial fibrillation 11/09/21 after presenting with symptoms of heart racing, weakness, and jaw discomfort. She went to the fire department down the street where she was found to be in atrial fibrillation with rate as high as 200 and was brought to our ED via EMS.  She was given a bolus of diltiazem and started on an infusion which controlled her rates.  Her acute symptoms resolved with rate control. She spontaneously converted to SR and was discharged on diltiazem and Eliquis for a CHADS2VASC score of 3. She remains in SR today. She feels her energy level is not back to baseline yet. No bleeding issues on anticoagulation.   Today, she denies symptoms of palpitations, chest pain, shortness of breath, orthopnea, PND, lower extremity edema, dizziness, presyncope, syncope, snoring, daytime somnolence, bleeding, or neurologic sequela. The patient is tolerating medications without difficulties and is otherwise without complaint today.    Atrial Fibrillation Risk Factors:  she does not have symptoms or diagnosis of sleep apnea. she does not have a history of rheumatic fever. she does have a history of alcohol use. The patient does not have a history of early familial atrial fibrillation or other arrhythmias.  she has a BMI of Body mass index is 28.27 kg/m.Marland Kitchen Filed Weights   11/16/21 1422  Weight: 67.9 kg    Family History  Problem Relation Age of Onset   Alcohol abuse Mother        died from stomach ulcers   Stroke Father        died  from cva and mi   Hypertension Father    Hyperlipidemia Father     Coronary artery disease Father        Age 24   Colon polyps Sister    Hepatitis C Brother    Colon cancer Neg Hx      Atrial Fibrillation Management history:  Previous antiarrhythmic drugs: none Previous cardioversions: none Previous ablations: none CHADS2VASC score: 3 Anticoagulation history: Eliquis   Past Medical History:  Diagnosis Date   Anxiety    Arthritis    R knee, back, hands    Cellulitis of knee 04/2016   RT KNEE   Cellulitis of right knee 04/18/2016   Childhood asthma    Complication of anesthesia    woke up slowly - 67yrs. ago   Depression    related to husband illness and death   History of jaundice    as a child     Hx of varicella    Hyperthyroidism    during pregnancy, treated & resolved post partum    IBS (irritable bowel syndrome)    LBBB (left bundle branch block)    Scoliosis    Scoliosis 07/2015   Thyroid disease in pregnancy    TOBACCO DEPENDENCE 07/06/2006   Qualifier: Diagnosis of  By: Knox Royalty     Troponin level elevated    Past Surgical History:  Procedure Laterality Date   BREAST BIOPSY Right 1979   benign    Cyst removed from ovary     KNEE SURGERY     right duda    LEFT HEART CATH  AND CORONARY ANGIOGRAPHY N/A 10/31/2018   Procedure: LEFT HEART CATH AND CORONARY ANGIOGRAPHY;  Surgeon: Lennette Bihari, MD;  [facility];  Service: Cardiovascular;  Laterality: N/A;   TONSILLECTOMY AND ADENOIDECTOMY  1073   TOTAL KNEE ARTHROPLASTY 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   Procedure: TOTAL KNEE ARTHROPLASTY;  Surgeon: [name], MD;  Location: [facility] OR;  Service: Orthopedics;  Laterality: Right;   TUBAL LIGATION     WISDOM TOOTH EXTRACTION      Current Outpatient Medications  Medication Sig Dispense Refill   acetaminophen (TYLENOL) 325 MG tablet Take 2 tablets (650 mg total) by mouth every 4 (four) hours as needed for headache, mild pain or moderate pain.     albuterol (VENTOLIN HFA) 108 (90 Base) MCG/ACT inhaler Inhale 2 puffs into  the lungs every 6 (six) hours as needed for wheezing or shortness of breath. 8.5 g 1   ALPRAZolam (XANAX) 0.5 MG tablet TAKE 1 TABLET BY MOUTH THREE TIMES A DAY AS NEEDED FOR ANXIETY. MAXIMUM 3 TALBETS PER 24 HOURS 30 tablet 0   Calcium Carb-Cholecalciferol (CALCIUM+D3) 600-800 MG-UNIT TABS Take 1 tablet by mouth daily with breakfast.     citalopram (CELEXA) 20 MG tablet Take 1 tablet by mouth once daily 90 tablet 0   diphenhydrAMINE (BENADRYL) 25 MG tablet Take 25 mg by mouth at bedtime as needed for allergies or sleep.      Multiple Vitamins-Minerals (ALIVE ONCE DAILY WOMENS 50+) TABS Take 1 tablet by mouth daily.     Probiotic Product (TRUBIOTICS) CAPS Take 1 capsule by mouth daily.     apixaban (ELIQUIS) 5 MG TABS tablet Take 1 tablet (5 mg total) by mouth 2 (two) times daily. 180 tablet 3   diltiazem (CARDIZEM CD) 120 MG 24 hr capsule Take 1 capsule (120 mg total) by mouth daily. 90 capsule 3   rosuvastatin (CRESTOR) 5 MG tablet Take 1 tablet (5 mg total) by mouth daily. 90 tablet 3   No current facility-administered medications for this encounter.    Allergies  Allergen Reactions   Codeine Itching, Swelling, Rash and Other (See Comments)    Hives and Vomiting   Lactose Intolerance (Gi) Diarrhea    Social History   Socioeconomic History   Marital status: Widowed    Spouse name: Not on file   Number of children: 3   Years of education: Not on file   Highest education level: Not on file  Occupational History   Occupation: self    Comment: Catering and cleaning  Tobacco Use   Smoking status: Former    Packs/day: 0.25    Years: 40.00    Total pack years: 10.00    Types: Cigarettes    Quit date: 01/06/2016    Years since quitting: 5.8   Smokeless tobacco: Never   Tobacco comments:    Former smoker 11/16/21  Substance and Sexual Activity   Alcohol use: Yes    Alcohol/week: 7.0 standard drinks of alcohol    Types: 7 Glasses of wine per week    Comment: 1 glass of wine  daily 11/16/21   Drug use: No   Sexual activity: Not on file  Other Topics Concern   Not on file  Social History Narrative   Husband passed away on 26-Feb-2008 hep c and cirrhosis.   She is hep c negative 2006   G3P3   HH of 2-3  son no pets    No falls .  Has smoke detector and wears seat  belts. POsitive  firearms. No excess sun exposure.    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   Had time of nono insuranced and cannot affort 800 per month for catastrophic    Stopped tobacco fall 2017 . ocass etoh.  No rec drugs.   Daughter patient in our practice   Sheppard Coil   Social Determinants of Health   Financial Resource Strain: Low Risk  (08/11/2021)   Overall Financial Resource Strain (CARDIA)    Difficulty of Paying Living Expenses: Not hard at all  Food Insecurity: No Food Insecurity (08/11/2021)   Hunger Vital Sign    Worried About Running Out of Food in the Last Year: Never true    Ran Out of Food in the Last Year: Never true  Transportation Needs: No Transportation Needs (08/11/2021)   PRAPARE - Hydrologist (Medical): No    Lack of Transportation (Non-Medical): No  Physical Activity: Sufficiently Active (08/11/2021)   Exercise Vital Sign    Days of Exercise per Week: 3 days    Minutes of Exercise per Session: 60 min  Stress: No Stress Concern Present (08/11/2021)   [place]    Feeling of Stress : Not at all  Social Connections: Moderately Integrated (08/11/2021)   Social Connection and Isolation Panel [NHANES]    Frequency of Communication with Friends and Family: More than three times a week    Frequency of Social Gatherings with Friends and Family: More than three times a week    Attends Religious Services: More than 4 times per year    Active Member of [employer] or Organizations: Yes    Attends Archivist Meetings: More than 4 times per year    Marital Status:  Widowed  Intimate Partner Violence: Not At Risk (08/11/2021)   Humiliation, Afraid, Rape, and Kick questionnaire    Fear of Current or Ex-Partner: No    Emotionally Abused: No    Physically Abused: No    Sexually Abused: No     ROS- All systems are reviewed and negative except as per the HPI above.  Physical Exam: Vitals:   11/16/21 1422  BP: 128/78  Pulse: 71  Weight: 67.9 kg  Height: 5\' 1"  (1.549 m)    GEN- The patient is a well appearing female, alert and oriented x 3 today.   Head- normocephalic, atraumatic Eyes-  Sclera clear, conjunctiva pink Ears- hearing intact Oropharynx- clear Neck- supple  Lungs- Clear to ausculation bilaterally, normal work of breathing Heart- Regular rate and rhythm, no murmurs, rubs or gallops  GI- soft, NT, ND, + BS Extremities- no clubbing, cyanosis, or edema MS- no significant deformity or atrophy Skin- no rash or lesion Psych- euthymic mood, full affect Neuro- strength and sensation are intact  Wt Readings from Last 3 Encounters:  11/16/21 67.9 kg  11/11/21 69.1 kg  10/12/21 70.9 kg    EKG today demonstrates  SR, LBBB Vent. rate 71 BPM PR interval 158 ms QRS duration 138 ms QT/QTcB 472/512 ms  Echo 11/10/21 demonstrated   1. Left ventricular ejection fraction, by estimation, is 55 to 60%. The  left ventricle has normal function. The left ventricle has no regional  wall motion abnormalities. Left ventricular diastolic parameters are  consistent with Grade I diastolic dysfunction (impaired relaxation).   2. Right ventricular systolic function is normal. The right ventricular  size is normal. There is normal pulmonary  artery systolic pressure.   3. The mitral valve is normal in structure. Mild mitral valve  regurgitation. No evidence of mitral stenosis.   4. The aortic valve is normal in structure. Aortic valve regurgitation is  mild. No aortic stenosis is present.   5. The inferior vena cava is dilated in size with >50%  respiratory  variability, suggesting right atrial pressure of 8 mmHg.   Epic records are reviewed at length today  CHA2DS2-VASc Score = 3  The patient's score is based upon: CHF History: 1 (EF recovered) HTN History: 0 Diabetes History: 0 Stroke History: 0 Vascular Disease History: 0 Age Score: 1 Gender Score: 1       ASSESSMENT AND PLAN: 1. Paroxysmal Atrial Fibrillation (ICD10:  I48.0) The patient's CHA2DS2-VASc score is 3, indicating a 3.2% annual risk of stroke.   General education about afib provided and questions answered. We also discussed her stroke risk and the risks and benefits of anticoagulation. We discussed rhythm control options today. AAD options limited because she is on citalopram, could consider alternate antidepressant. We also discussed afib ablation. She is not ready to commit to do the procedure right now but would like to meet with EP to discuss in more detail, will refer back to Dr Elberta Fortis.  Continue Eliquis 5 mg BID Continue diltiazem 120 mg daily We discussed smart device technology (smart watch, Lourena Simmonds) for home monitoring.   2. Secondary Hypercoagulable State (ICD10:  D68.69) The patient is at significant risk for stroke/thromboembolism based upon her CHA2DS2-VASc Score of 3.  Continue Apixaban (Eliquis).   3. 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   Follow up in the AF clinic in 3 months.    Jorja Loa PA-C Afib Clinic [facility] [address] [place], [place] [address] [phone] 11/16/2021 3:12 PM

## 2021-11-18 ENCOUNTER — Telehealth (HOSPITAL_BASED_OUTPATIENT_CLINIC_OR_DEPARTMENT_OTHER): Payer: Self-pay

## 2021-11-18 ENCOUNTER — Other Ambulatory Visit (HOSPITAL_BASED_OUTPATIENT_CLINIC_OR_DEPARTMENT_OTHER): Payer: Self-pay

## 2021-11-18 NOTE — Telephone Encounter (Signed)
Transitions of Care Pharmacy   Call attempted for a pharmacy transitions of care follow-up. HIPAA appropriate voicemail was left with call back information provided.   Call attempt #1. Will follow-up in 2-3 days.    Jiles Crocker, PharmD Clinical Pharmacist [facility] 11/18/2021 1:52 PM

## 2021-11-23 ENCOUNTER — Other Ambulatory Visit: Payer: Self-pay | Admitting: Cardiovascular Disease

## 2021-11-23 DIAGNOSIS — E7800 Pure hypercholesterolemia, unspecified: Secondary | ICD-10-CM

## 2021-11-24 ENCOUNTER — Encounter: Payer: Self-pay | Admitting: Internal Medicine

## 2021-11-24 ENCOUNTER — Ambulatory Visit (INDEPENDENT_AMBULATORY_CARE_PROVIDER_SITE_OTHER): Payer: PPO | Admitting: Internal Medicine

## 2021-11-24 VITALS — BP 134/80 | HR 64 | Temp 98.0°F | Ht 61.0 in | Wt 149.0 lb

## 2021-11-24 DIAGNOSIS — I2510 Atherosclerotic heart disease of native coronary artery without angina pectoris: Secondary | ICD-10-CM

## 2021-11-24 DIAGNOSIS — Z79899 Other long term (current) drug therapy: Secondary | ICD-10-CM | POA: Diagnosis not present

## 2021-11-24 DIAGNOSIS — F411 Generalized anxiety disorder: Secondary | ICD-10-CM | POA: Diagnosis not present

## 2021-11-24 DIAGNOSIS — I4891 Unspecified atrial fibrillation: Secondary | ICD-10-CM | POA: Diagnosis not present

## 2021-11-24 DIAGNOSIS — Z09 Encounter for follow-up examination after completed treatment for conditions other than malignant neoplasm: Secondary | ICD-10-CM | POA: Diagnosis not present

## 2021-11-24 DIAGNOSIS — Z7901 Long term (current) use of anticoagulants: Secondary | ICD-10-CM

## 2021-11-24 MED ORDER — ALPRAZOLAM 0.5 MG PO TABS: ORAL_TABLET | ORAL | 0 refills | Status: DC

## 2021-11-24 NOTE — Progress Notes (Signed)
Chief Complaint  Patient presents with   Hospitalization Follow-up    HPI: Valerie Love 66 y.o. come in for fu hosp 7/4-7/6 23  for atrial fib with RVR . She felt rapid heart rate without  chest pain   drove to local firestation  nd they evaluation and got her to ed . Rx with iv diltiazem and eventually   reverted to sr . Now on anticoagulation  not sures she understands assoc with stroke reduction. Has been to af clinic and tp see the electrophys cards .   No triggers ex stress/  child got married .  No sig etoh .  Still thinks citalopram helpful  Would like refill for alpazolam if needed  ROS: See pertinent positives and negatives per HPI. No cp sob bleeding   anxiety status quo  Past Medical History:  Diagnosis Date   Anxiety    Arthritis    R knee, back, hands    Cellulitis of knee 04/2016   RT KNEE   Cellulitis of right knee 04/18/2016   Childhood asthma    Complication of anesthesia    woke up slowly - 63yrs. ago   Depression    related to husband illness and death   History of jaundice    as a child     Hx of varicella    Hyperthyroidism    during pregnancy, treated & resolved post partum    IBS (irritable bowel syndrome)    LBBB (left bundle branch block)    Scoliosis    Scoliosis 07/2015   Thyroid disease in pregnancy    TOBACCO DEPENDENCE 07/06/2006   Qualifier: Diagnosis of  By: Knox Royalty     Troponin level elevated     Family History  Problem Relation Age of Onset   Alcohol abuse Mother        died from stomach ulcers   Stroke Father        died  from cva and mi   Hypertension Father    Hyperlipidemia Father    Coronary artery disease Father        Age 40   Colon polyps Sister    Hepatitis C Brother    Colon cancer Neg Hx     Social History   Socioeconomic History   Marital status: Widowed    Spouse name: Not on file   Number of children: 3   Years of education: Not on file   Highest education level: Not on file  Occupational  History   Occupation: self    Comment: Catering and cleaning  Tobacco Use   Smoking status: Former    Packs/day: 0.25    Years: 40.00    Total pack years: 10.00    Types: Cigarettes    Quit date: 01/06/2016    Years since quitting: 5.8   Smokeless tobacco: Never   Tobacco comments:    Former smoker 11/16/21  Substance and Sexual Activity   Alcohol use: Yes    Alcohol/week: 7.0 standard drinks of alcohol    Types: 7 Glasses of wine per week    Comment: 1 glass of wine daily 11/16/21   Drug use: No   Sexual activity: Not on file  Other Topics Concern   Not on file  Social History Narrative   Husband passed away on 02/25/2008 hep c and cirrhosis.   She is hep c negative 2006   G3P3   HH of 2-3  son no pets  No falls .  Has smoke detector and wears seat belts. POsitive  firearms. No excess sun exposure.    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   Had time of nono insuranced and cannot affort 800 per month for catastrophic    Stopped tobacco fall 2017 . ocass etoh.  No rec drugs.   Daughter patient in our practice   Sheppard Coil   Social Determinants of Health   Financial Resource Strain: Low Risk  (08/11/2021)   Overall Financial Resource Strain (CARDIA)    Difficulty of Paying Living Expenses: Not hard at all  Food Insecurity: No Food Insecurity (08/11/2021)   Hunger Vital Sign    Worried About Running Out of Food in the Last Year: Never true    Ran Out of Food in the Last Year: Never true  Transportation Needs: No Transportation Needs (08/11/2021)   PRAPARE - Hydrologist (Medical): No    Lack of Transportation (Non-Medical): No  Physical Activity: Sufficiently Active (08/11/2021)   Exercise Vital Sign    Days of Exercise per Week: 3 days    Minutes of Exercise per Session: 60 min  Stress: No Stress Concern Present (08/11/2021)   [place]    Feeling of Stress :  Not at all  Social Connections: Moderately Integrated (08/11/2021)   Social Connection and Isolation Panel [NHANES]    Frequency of Communication with Friends and Family: More than three times a week    Frequency of Social Gatherings with Friends and Family: More than three times a week    Attends Religious Services: More than 4 times per year    Active Member of [employer] or Organizations: Yes    Attends Archivist Meetings: More than 4 times per year    Marital Status: Widowed    Outpatient Medications Prior to Visit  Medication Sig Dispense Refill   acetaminophen (TYLENOL) 325 MG tablet Take 2 tablets (650 mg total) by mouth every 4 (four) hours as needed for headache, mild pain or moderate pain.     albuterol (VENTOLIN HFA) 108 (90 Base) MCG/ACT inhaler Inhale 2 puffs into the lungs every 6 (six) hours as needed for wheezing or shortness of breath. 8.5 g 1   apixaban (ELIQUIS) 5 MG TABS tablet Take 1 tablet (5 mg total) by mouth 2 (two) times daily. 180 tablet 3   Calcium Carb-Cholecalciferol (CALCIUM+D3) 600-800 MG-UNIT TABS Take 1 tablet by mouth daily with breakfast.     citalopram (CELEXA) 20 MG tablet Take 1 tablet by mouth once daily 90 tablet 0   diltiazem (CARDIZEM CD) 120 MG 24 hr capsule Take 1 capsule (120 mg total) by mouth daily. 90 capsule 3   diphenhydrAMINE (BENADRYL) 25 MG tablet Take 25 mg by mouth at bedtime as needed for allergies or sleep.      Multiple Vitamins-Minerals (ALIVE ONCE DAILY WOMENS 50+) TABS Take 1 tablet by mouth daily.     Probiotic Product (TRUBIOTICS) CAPS Take 1 capsule by mouth daily.     rosuvastatin (CRESTOR) 5 MG tablet Take 1 tablet by mouth once daily 90 tablet 0   ALPRAZolam (XANAX) 0.5 MG tablet TAKE 1 TABLET BY MOUTH THREE TIMES A DAY AS NEEDED FOR ANXIETY. MAXIMUM 3 TALBETS PER 24 HOURS 30 tablet 0   No facility-administered medications prior to visit.     EXAM:  BP 134/80 (BP Location: Left Arm,  Patient Position: Sitting, Cuff  Size: Normal)   Pulse 64   Temp 98 F (36.7 C) (Oral)   Ht 5\' 1"  (1.549 m)   Wt 149 lb (67.6 kg)   SpO2 97%   BMI 28.15 kg/m   Body mass index is 28.15 kg/m.  GENERAL: vitals reviewed and listed above, alert, oriented, appears well hydrated and in no acute distress HEENT: atraumatic, conjunctiva  clear, no obvious abnormalities on inspection of external nose and ears  NECK: no obvious masses on inspection palpation  LUNGS: clear to auscultation bilaterally, no wheezes, rales or rhonchi, good air movement CV: HRRR, no clubbing cyanosis or  peripheral edema nl cap refill  MS: moves all extremities without noticeable focal  abnormality well healed knee scars  PSYCH: pleasant and cooperative, no obvious depression or anxiety Lab Results  Component Value Date   WBC 6.6 11/09/2021   HGB 13.1 11/09/2021   HCT 38.6 11/09/2021   PLT 225 11/09/2021   GLUCOSE 103 (H) 11/10/2021   CHOL 194 06/22/2021   TRIG 198.0 (H) 06/22/2021   HDL 80.00 06/22/2021   LDLCALC 75 06/22/2021   ALT 27 06/22/2021   AST 27 06/22/2021   NA 142 11/10/2021   K 4.0 11/10/2021   CL 113 (H) 11/10/2021   CREATININE 0.68 11/10/2021   BUN 18 11/10/2021   CO2 24 11/10/2021   TSH 2.478 11/10/2021   HGBA1C 5.6 06/22/2021   BP Readings from Last 3 Encounters:  11/24/21 134/80  11/16/21 128/78  11/11/21 (!) 143/82   Record revwiew  ASSESSMENT AND PLAN:  Discussed the following assessment and plan:  Atrial fibrillation, unspecified type (HCC) - reg rhythym today   Hospital discharge follow-up  Medication management  Anxiety state  Chronic anticoagulation  Coronary artery disease involving native heart without angina pectoris, unspecified vessel or lesion type Counseled discussed explanation for reasoning behind anticoagulation and stroke reduction with A-fib.  Also risk benefit of medication. Her dad had a stroke but she does not know the reason and no diagnosis of A-fib noted in the family that she  is aware. Appears to be in regular rhythm today encouraged her regular cardiology follow-up for opinions Refilled alprazolam if need to change citalopram because of drug interactions specialty team can contact 01/12/22. -Patient advised to return or notify health care team  if  new concerns arise.  Patient Instructions  Good to see you today .  Refilled  alprazolam.  Stay  on blood thinner.   Keep cards appt.    [place]. Henrry Feil M.D.

## 2021-11-24 NOTE — Patient Instructions (Signed)
Good to see you today .  Refilled  alprazolam.  Stay  on blood thinner.   Keep cards appt.

## 2021-11-25 ENCOUNTER — Encounter: Payer: Self-pay | Admitting: Cardiology

## 2021-11-25 ENCOUNTER — Other Ambulatory Visit (HOSPITAL_COMMUNITY): Payer: Self-pay

## 2021-11-26 ENCOUNTER — Other Ambulatory Visit (HOSPITAL_COMMUNITY): Payer: Self-pay | Admitting: *Deleted

## 2021-11-26 DIAGNOSIS — I480 Paroxysmal atrial fibrillation: Secondary | ICD-10-CM

## 2021-11-29 ENCOUNTER — Telehealth: Payer: Self-pay

## 2021-11-29 NOTE — Telephone Encounter (Signed)
She returned my call TG:GYIR program referral; explained PREP she would like to attend 8/8 class at [facility], every T/Th 10-11:15; will contact end of July to schedule assessment visit.

## 2021-12-07 ENCOUNTER — Telehealth: Payer: Self-pay

## 2021-12-07 NOTE — Telephone Encounter (Signed)
Telephone call chart error

## 2021-12-07 NOTE — Progress Notes (Signed)
[facility] PREP Evaluation  Patient Details  Name: Valerie Love MRN: [id number] Date of Birth: February 20, 1956 Age: 66 y.o. PCP: Madelin Headings, MD  Vitals:   12/07/21 0931  BP: (!) 144/70  Pulse: 72  SpO2: 97%  Weight: 147 lb 9.6 oz (67 kg)     [facility] Eval - 12/07/21 0900       [facility] "PREP" Location   [facility] "PREP" Location [facility]      Referral    Referring Provider R. Fenton    Reason for referral --   AFIB   Program Start Date 12/14/21      Measurement   Waist Circumference 36.5 inches    Hip Circumference 42.5 inches    Body fat 23.7 percent      Information for Trainer   Goals --   Lose 10 pounds by end of program; establish strength training program   Current Exercise walking    Orthopedic Concerns --   scoliosis, R TKA, cervical spine injection   Pertinent Medical History --   Afib, CHF     Timed Up and Go (TUGS)   Timed Up and Go Low risk <9 seconds      Mobility and Daily Activities   I find it easy to walk up or down two or more flights of stairs. 2    I have no trouble taking out the trash. 4    I do housework such as vacuuming and dusting on my own without difficulty. 4    I can easily lift a gallon of milk (8lbs). 4    I can easily walk a mile. 2    I have no trouble reaching into high cupboards or reaching down to pick up something from the floor. 4    I do not have trouble doing out-door work such as Loss adjuster, chartered, raking leaves, or gardening. 2      Mobility and Daily Activities   I feel younger than my age. 1    I feel independent. 4    I feel energetic. 2    I live an active life.  4    I feel strong. 2    I feel healthy. 2    I feel active as other people my age. 2      How fit and strong are you.   Fit and Strong Total Score 39            Past Medical History:  Diagnosis Date   Anxiety    Arthritis    R knee, back, hands    Cellulitis of knee 04/2016   RT KNEE   Cellulitis of right knee 04/18/2016   Childhood asthma     Complication of anesthesia    woke up slowly - 74yrs. ago   Depression    related to husband illness and death   History of jaundice    as a child     Hx of varicella    Hyperthyroidism    during pregnancy, treated & resolved post partum    IBS (irritable bowel syndrome)    LBBB (left bundle branch block)    Scoliosis    Scoliosis 07/2015   Thyroid disease in pregnancy    TOBACCO DEPENDENCE 07/06/2006   Qualifier: Diagnosis of  By: Knox Royalty     Troponin level elevated    Past Surgical History:  Procedure Laterality Date   BREAST BIOPSY Right 1979   benign    Cyst  removed from ovary     KNEE SURGERY     right duda    LEFT HEART CATH AND CORONARY ANGIOGRAPHY N/A 10/31/2018   Procedure: LEFT HEART CATH AND CORONARY ANGIOGRAPHY;  Surgeon: Lennette Bihari, MD;  [facility]: [facility];  Service: Cardiovascular;  Laterality: N/A;   TONSILLECTOMY AND ADENOIDECTOMY  1073   TOTAL KNEE ARTHROPLASTY 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   Procedure: TOTAL KNEE ARTHROPLASTY;  Surgeon: [name], MD;  Location: [facility] OR;  Service: Orthopedics;  Laterality: Right;   TUBAL LIGATION     WISDOM TOOTH EXTRACTION     Social History   Tobacco Use  Smoking Status Former   Packs/day: 0.25   Years: 40.00   Total pack years: 10.00   Types: Cigarettes   Quit date: 01/06/2016   Years since quitting: 5.9  Smokeless Tobacco Never  Tobacco Comments   Former smoker 11/16/21  To start PREP class at [facility] 8/8, every T/Th 10-11:15  Khilynn Borntreger B Mubarak Bevens 12/07/2021, 9:36 AM

## 2021-12-09 ENCOUNTER — Other Ambulatory Visit (HOSPITAL_COMMUNITY): Payer: Self-pay

## 2021-12-14 NOTE — Progress Notes (Signed)
[facility] PREP Weekly Session  Patient Details  Name: Nasya Vincent MRN: [id number] Date of Birth: 11-25-1955 Age: 66 y.o. PCP: Madelin Headings, MD  There were no vitals filed for this visit.   [facility] Weekly seesion - 12/14/21 1100       [facility] "PREP" Location   [facility] "PREP" Location [facility]      Weekly Session   Topic Discussed Goal setting and welcome to the program   Introductions, review of workbook, tour of facility, offered option to exercise on cardio machine   Classes attended to date 1             Natika Geyer B Marg[place] Widener 12/14/2021, 11:43 AM

## 2021-12-21 NOTE — Progress Notes (Signed)
[facility] PREP Weekly Session  Patient Details  Name: Valerie Love MRN: [id number] Date of Birth: [birth year] Age: 66 y.o. PCP: Madelin Headings, MD  Vitals:   12/21/21 1315  Weight: 147 lb (66.7 kg)     [facility] Weekly seesion - 12/21/21 1300       [facility] "PREP" Location   [facility] "PREP" Location [facility]      Weekly Session   Topic Discussed Other ways to be active;Importance of resistance training   Goal: 150 min/cardio each wk; strength training 2-3 times a wk for 20-40 minutes   Minutes exercised this week 120 minutes    Classes attended to date 3             Valerie Love 12/21/2021, 1:17 PM

## 2021-12-28 NOTE — Progress Notes (Signed)
[facility] PREP Weekly Session  Patient Details  Name: Valerie Love MRN: [id number] Date of Birth: [birth year] Age: 66 y.o. PCP: Madelin Headings, MD  Vitals:   12/28/21 1145  Weight: 145 lb (65.8 kg)     [facility] Weekly seesion - 12/28/21 1100       [facility] "PREP" Location   [facility]PREP[facility] Location [facility]      Weekly Session   Topic Discussed Healthy eating tips   Salt 1500-2300 mg/day, 24 gms added sugar, introduced Yuka app; avoid enriched/processed products, avoid frying, enc to eat the rainbow of colors   Classes attended to date 5             Valerie Love Valerie Love 12/28/2021, 11:46 AM

## 2022-01-04 NOTE — Progress Notes (Signed)
[facility] PREP Weekly Session  Patient Details  Name: Valerie Love MRN: [id number] Date of Birth: [birth year] Age: 66 y.o. PCP: Madelin Headings, MD  Vitals:   01/04/22 1141  Weight: 145 lb (65.8 kg)     [facility] Weekly seesion - 01/04/22 1100       [facility] "PREP" Location   [facility]PREP[facility] Location [facility]      Weekly Session   Topic Discussed Health habits   Sugar demo, limit added sugars 24gms/day; YUKA app; water: 1/2 body in oz or 64 oz/day   Classes attended to date 6             Eldoris Beiser B Arwilda [place] 01/04/2022, 11:42 AM

## 2022-01-11 NOTE — Progress Notes (Signed)
[facility] PREP Weekly Session  Patient Details  Name: Valerie Love MRN: [id number] Date of Birth: 10-11-1955 Age: 66 y.o. PCP: Madelin Headings, MD  Vitals:   01/11/22 1132  Weight: 144 lb (65.3 kg)     [facility] Weekly seesion - 01/11/22 1100       [facility] "PREP" Location   [facility]PREP[facility] Location [facility]      Weekly Session   Topic Discussed Restaurant Eating   Na limits 1500-2300mg /day; Salt demo   Minutes exercised this week 960 minutes    Classes attended to date 8             Silvia Hightower B Itsel Opfer 01/11/2022, 11:35 AM

## 2022-01-12 ENCOUNTER — Ambulatory Visit: Payer: PPO | Attending: Cardiology | Admitting: Cardiology

## 2022-01-12 ENCOUNTER — Encounter: Payer: Self-pay | Admitting: Cardiology

## 2022-01-12 VITALS — BP 112/66 | HR 58 | Ht 61.0 in | Wt 146.2 lb

## 2022-01-12 DIAGNOSIS — I480 Paroxysmal atrial fibrillation: Secondary | ICD-10-CM

## 2022-01-12 NOTE — Patient Instructions (Signed)
Medication Instructions:  Your physician recommends that you continue on your current medications as directed. Please refer to the Current Medication list given to you today.  *If you need a refill on your cardiac medications before your next appointment, please call your pharmacy*   Lab Work: None ordered   Testing/Procedures: None ordered   Follow-Up: At [facility], you and your health needs are our priority.  As part of our continuing mission to provide you with exceptional heart care, we have created designated Provider Care Teams.  These Care Teams include your primary Cardiologist (physician) and Advanced Practice Providers (APPs -  Physician Assistants and Nurse Practitioners) who all work together to provide you with the care you need, when you need it.  Your next appointment:   6 month(s)  The format for your next appointment:   In Person  Provider:   Loman Brooklyn, MD    Thank you for choosing [facility]!!   Dory Horn, RN [phone]  Other Instructions  Please call the office if you would like to schedule an ablation.  Cardiac Ablation Cardiac ablation is a procedure to destroy (ablate) some heart tissue that is sending bad signals. These bad signals cause problems in heart rhythm. The heart has many areas that make these signals. If there are problems in these areas, they can make the heart beat in a way that is not normal. Destroying some tissues can help make the heart rhythm normal. Tell your doctor about: Any allergies you have. All medicines you are taking. These include vitamins, herbs, eye drops, creams, and over-the-counter medicines. Any problems you or family members have had with medicines that make you fall asleep (anesthetics). Any blood disorders you have. Any surgeries you have had. Any medical conditions you have, such as kidney failure. Whether you are pregnant or may be pregnant. What are the risks? This is a safe procedure.  But problems may occur, including: Infection. Bruising and bleeding. Bleeding into the chest. Stroke or blood clots. Damage to nearby areas of your body. Allergies to medicines or dyes. The need for a pacemaker if the normal system is damaged. Failure of the procedure to treat the problem. What happens before the procedure? Medicines Ask your doctor about: Changing or stopping your normal medicines. This is important. Taking aspirin and ibuprofen. Do not take these medicines unless your doctor tells you to take them. Taking other medicines, vitamins, herbs, and supplements. General instructions Follow instructions from your doctor about what you cannot eat or drink. Plan to have someone take you home from the hospital or clinic. If you will be going home right after the procedure, plan to have someone with you for 24 hours. Ask your doctor what steps will be taken to prevent infection. What happens during the procedure?  An IV tube will be put into one of your veins. You will be given a medicine to help you relax. The skin on your neck or groin will be numbed. A cut (incision) will be made in your neck or groin. A needle will be put through your cut and into a large vein. A tube (catheter) will be put into the needle. The tube will be moved to your heart. Dye may be put through the tube. This helps your doctor see your heart. Small devices (electrodes) on the tube will send out signals. A type of energy will be used to destroy some heart tissue. The tube will be taken out. Pressure will be held on your  cut. This helps stop bleeding. A bandage will be put over your cut. The exact procedure may vary among doctors and hospitals. What happens after the procedure? You will be watched until you leave the hospital or clinic. This includes checking your heart rate, breathing rate, oxygen, and blood pressure. Your cut will be watched for bleeding. You will need to lie still for a few  hours. Do not drive for 24 hours or as long as your doctor tells you. Summary Cardiac ablation is a procedure to destroy some heart tissue. This is done to treat heart rhythm problems. Tell your doctor about any medical conditions you may have. Tell him or her about all medicines you are taking to treat them. This is a safe procedure. But problems may occur. These include infection, bruising, bleeding, and damage to nearby areas of your body. Follow what your doctor tells you about food and drink. You may also be told to change or stop some of your medicines. After the procedure, do not drive for 24 hours or as long as your doctor tells you. This information is not intended to replace advice given to you by your health care provider. Make sure you discuss any questions you have with your health care provider. Document Revised: 07/16/2021 Document Reviewed: 03/28/2019 Elsevier Patient Education  2023 Elsevier Inc.    Important Information About Sugar

## 2022-01-12 NOTE — Progress Notes (Signed)
Electrophysiology Office Note   Date:  01/12/2022   ID:  Vassie, Kugel [birth year], MRN [id number]  PCP:  Madelin Headings, MD  Cardiologist:  Allyson Sabal Primary Electrophysiologist:  Lizzy Hamre Jorja Loa, MD    Chief Complaint: AF   History of Present Illness: Valerie Love is a 66 y.o. female who is being seen today for the evaluation of AF at the request of Fenton, Clint R, PA. Presenting today for electrophysiology evaluation.  She has a history significant for LV systolic dysfunction, non-STEMI with normal coronary arteries in 2020, hyperlipidemia, atrial fibrillation.  She was diagnosed with atrial fibrillation 11/09/2021 after presenting with palpitations, weakness, jaw discomfort.  She was found to be in atrial fibrillation with heart rates as high as 200 bpm.  She was given diltiazem which controlled her rates.  She spontaneously converted to sinus rhythm.  Today, she denies symptoms of palpitations, chest pain, shortness of breath, orthopnea, PND, lower extremity edema, claudication, dizziness, presyncope, syncope, bleeding, or neurologic sequela. The patient is tolerating medications without difficulties.    Past Medical History:  Diagnosis Date   Anxiety    Arthritis    R knee, back, hands    Cellulitis of knee 04/2016   RT KNEE   Cellulitis of right knee 04/18/2016   Childhood asthma    Complication of anesthesia    woke up slowly - 84yrs. ago   Depression    related to husband illness and death   History of jaundice    as a child     Hx of varicella    Hyperthyroidism    during pregnancy, treated & resolved post partum    IBS (irritable bowel syndrome)    LBBB (left bundle branch block)    Scoliosis    Scoliosis 07/2015   Thyroid disease in pregnancy    TOBACCO DEPENDENCE 07/06/2006   Qualifier: Diagnosis of  By: Knox Royalty     Troponin level elevated    Past Surgical History:  Procedure Laterality Date   BREAST BIOPSY Right 1979   benign     Cyst removed from ovary     KNEE SURGERY     right duda    LEFT HEART CATH AND CORONARY ANGIOGRAPHY N/A 10/31/2018   Procedure: LEFT HEART CATH AND CORONARY ANGIOGRAPHY;  Surgeon: Lennette Bihari, MD;  Location: [facility] INVASIVE CV LAB;  Service: Cardiovascular;  Laterality: N/A;   TONSILLECTOMY AND ADENOIDECTOMY  1073   TOTAL KNEE ARTHROPLASTY 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   Procedure: TOTAL KNEE ARTHROPLASTY;  Surgeon: [name], MD;  Location: [facility] OR;  Service: Orthopedics;  Laterality: Right;   TUBAL LIGATION     WISDOM TOOTH EXTRACTION       Current Outpatient Medications  Medication Sig Dispense Refill   acetaminophen (TYLENOL) 325 MG tablet Take 2 tablets (650 mg total) by mouth every 4 (four) hours as needed for headache, mild pain or moderate pain.     albuterol (VENTOLIN HFA) 108 (90 Base) MCG/ACT inhaler Inhale 2 puffs into the lungs every 6 (six) hours as needed for wheezing or shortness of breath. 8.5 g 1   ALPRAZolam (XANAX) 0.5 MG tablet TAKE 1 TABLET BY MOUTH THREE TIMES A DAY AS NEEDED FOR ANXIETY. MAXIMUM 3 TALBETS PER 24 HOURS 30 tablet 0   apixaban (ELIQUIS) 5 MG TABS tablet Take 1 tablet (5 mg total) by mouth 2 (two) times daily. 180 tablet 3   Calcium Carb-Cholecalciferol (CALCIUM+D3) 600-800 MG-UNIT TABS Take 1 tablet by  mouth daily with breakfast.     citalopram (CELEXA) 20 MG tablet Take 1 tablet by mouth once daily 90 tablet 0   diltiazem (CARDIZEM CD) 120 MG 24 hr capsule Take 1 capsule (120 mg total) by mouth daily. 90 capsule 3   diphenhydrAMINE (BENADRYL) 25 MG tablet Take 25 mg by mouth at bedtime as needed for allergies or sleep.      Multiple Vitamins-Minerals (ALIVE ONCE DAILY WOMENS 50+) TABS Take 1 tablet by mouth daily.     Probiotic Product (TRUBIOTICS) CAPS Take 1 capsule by mouth daily.     rosuvastatin (CRESTOR) 5 MG tablet Take 1 tablet by mouth once daily 90 tablet 0   No current facility-administered medications for this visit.    Allergies:   Codeine and  Lactose intolerance (gi)   Social History:  The patient  reports that she quit smoking about 6 years ago. Her smoking use included cigarettes. She has a 10.00 pack-year smoking history. She has never used smokeless tobacco. She reports current alcohol use of about 7.0 standard drinks of alcohol per week. She reports that she does not use drugs.   Family History:  The patient's family history includes Alcohol abuse in her mother; Colon polyps in her sister; Coronary artery disease in her father; Hepatitis C in her brother; Hyperlipidemia in her father; Hypertension in her father; Stroke in her father.    ROS:  Please see the history of present illness.   Otherwise, review of systems is positive for none.   All other systems are reviewed and negative.    PHYSICAL EXAM: VS:  BP 112/66   Pulse (!) 58   Ht 5\' 1"  (1.549 m)   Wt 146 lb 3.2 oz (66.3 kg)   SpO2 97%   BMI 27.62 kg/m  , BMI Body mass index is 27.62 kg/m. GEN: Well nourished, well developed, in no acute distress  HEENT: normal  Neck: no JVD, carotid bruits, or masses Cardiac: RRR; no murmurs, rubs, or gallops,no edema  Respiratory:  clear to auscultation bilaterally, normal work of breathing GI: soft, nontender, nondistended, + BS MS: no deformity or atrophy  Skin: warm and dry Neuro:  Strength and sensation are intact Psych: euthymic mood, full affect  EKG:  EKG is ordered today. Personal review of the ekg ordered shows sinus rhythm, rate 58  Recent Labs: 06/22/2021: ALT 27 11/09/2021: B Natriuretic Peptide 109.9; Hemoglobin 13.1; Magnesium 1.9; Platelets 225 11/10/2021: BUN 18; Creatinine, Ser 0.68; Potassium 4.0; Sodium 142; TSH 2.478    Lipid Panel     Component Value Date/Time   CHOL 194 06/22/2021 1147   CHOL 174 08/08/2019 0957   TRIG 198.0 (H) 06/22/2021 1147   HDL 80.00 06/22/2021 1147   HDL 74 08/08/2019 0957   CHOLHDL 2 06/22/2021 1147   VLDL 39.6 06/22/2021 1147   LDLCALC 75 06/22/2021 1147   LDLCALC 82  08/08/2019 0957     Wt Readings from Last 3 Encounters:  01/12/22 146 lb 3.2 oz (66.3 kg)  01/11/22 144 lb (65.3 kg)  01/04/22 145 lb (65.8 kg)      Other studies Reviewed: Additional studies/ records that were reviewed today include: TTE 11/10/21  Review of the above records today demonstrates:   1. Left ventricular ejection fraction, by estimation, is 55 to 60%. The  left ventricle has normal function. The left ventricle has no regional  wall motion abnormalities. Left ventricular diastolic parameters are  consistent with Grade I diastolic  dysfunction (impaired relaxation).  2. Right ventricular systolic function is normal. The right ventricular  size is normal. There is normal pulmonary artery systolic pressure.   3. The mitral valve is normal in structure. Mild mitral valve  regurgitation. No evidence of mitral stenosis.   4. The aortic valve is normal in structure. Aortic valve regurgitation is  mild. No aortic stenosis is present.   5. The inferior vena cava is dilated in size with >50% respiratory  variability, suggesting right atrial pressure of 8 mmHg.    ASSESSMENT AND PLAN:  1.  Paroxysmal atrial fibrillation: CHA2DS2-VASc of 1.  Currently on Eliquis 5 mg twice daily, diltiazem 120 mg daily.  She feels quite poorly when she is in atrial fibrillation.  She has not had any further episodes of atrial fibrillation since starting the diltiazem.  She would like to remain in normal rhythm.  She is unsure as to whether or not she wants ablation.  We Donovan Persley give her information and she Urijah Raynor call us back and let us know.  2.  Secondary hypercoagulable state: Currently on Eliquis for atrial fibrillation as above  3.  Chronic systolic heart failure with reduced ejection fraction: Ejection fraction is fortunately recovered.  Plan per primary cardiology.    Current medicines are reviewed at length with the patient today.   The patient does not have concerns regarding her medicines.   The following changes were made today:  none  Labs/ tests ordered today include:  Orders Placed This Encounter  Procedures   EKG 12-Lead     Disposition:   FU with Shakari Qazi 6 months  Signed, Kamaiyah Uselton Jorja Loa, MD  01/12/2022 11:58 AM     [facility] [address] [place] [address] [phone] (office) [phone] (fax)

## 2022-01-18 ENCOUNTER — Encounter: Payer: Self-pay | Admitting: *Deleted

## 2022-01-18 NOTE — Progress Notes (Signed)
[facility] PREP Weekly Session  Patient Details  Name: Valerie Love MRN: [id number] Date of Birth: January 06, 1956 Age: 66 y.o. PCP: Madelin Headings, MD  Vitals:   01/18/22 1213  Weight: 144 lb (65.3 kg)     [facility] Weekly seesion - 01/18/22 1200       [facility] "PREP" Location   [facility] "PREP" Location [facility]      Weekly Session   Topic Discussed Stress management and problem solving   Importance of sleep, good sleep hygiene, introduction to guided meditation   Classes attended to date 10             Remo Lipps 01/18/2022, 12:14 PM

## 2022-01-25 ENCOUNTER — Encounter: Payer: Self-pay | Admitting: *Deleted

## 2022-01-25 NOTE — Progress Notes (Signed)
[facility] PREP Weekly Session  Patient Details  Name: Valerie Love MRN: [id number] Date of Birth: Apr 13, 1956 Age: 66 y.o. PCP: Burnis Medin, MD  Vitals:   01/25/22 1219  Weight: 145 lb (65.8 kg)     [facility] Weekly seesion - 01/25/22 1200       [facility] "PREP" Location   [facility] "PREP" Location [facility]      Weekly Session   Topic Discussed Expectations and non-scale victories   Half way through program, re-visiting goals, evaluating progress, staying positive.and managing challenges   Minutes exercised this week 800 minutes    Classes attended to date [place], [place] 01/25/2022, 12:21 PM

## 2022-02-04 ENCOUNTER — Other Ambulatory Visit: Payer: Self-pay | Admitting: Internal Medicine

## 2022-02-08 ENCOUNTER — Encounter: Payer: Self-pay | Admitting: *Deleted

## 2022-02-08 NOTE — Progress Notes (Signed)
[facility] PREP Weekly Session  Patient Details  Name: Valerie Love MRN: [id number] Date of Birth: [birth year] Age: 66 y.o. PCP: Burnis Medin, MD  Vitals:   02/08/22 1158  Weight: 141 lb (64 kg)     [facility] Weekly seesion - 02/08/22 1100       [facility] "PREP" Location   [facility] "PREP" Location [facility]      Weekly Session   Topic Discussed Finding support   Accountability measures internal and external, accountabiity partners, identifying sabotaging behaviors.   Minutes exercised this week 970 minutes    Classes attended to date [place] 02/08/2022, 12:01 PM

## 2022-02-15 ENCOUNTER — Encounter (HOSPITAL_COMMUNITY): Payer: Self-pay

## 2022-02-15 ENCOUNTER — Ambulatory Visit (INDEPENDENT_AMBULATORY_CARE_PROVIDER_SITE_OTHER): Payer: PPO

## 2022-02-15 ENCOUNTER — Ambulatory Visit: Payer: PPO | Admitting: Orthopedic Surgery

## 2022-02-15 ENCOUNTER — Encounter: Payer: Self-pay | Admitting: *Deleted

## 2022-02-15 DIAGNOSIS — G8929 Other chronic pain: Secondary | ICD-10-CM | POA: Diagnosis not present

## 2022-02-15 DIAGNOSIS — M25511 Pain in right shoulder: Secondary | ICD-10-CM | POA: Diagnosis not present

## 2022-02-15 NOTE — Progress Notes (Signed)
[facility] PREP Weekly Session  Patient Details  Name: Valerie Love MRN: [id number] Date of Birth: 05-21-1955 Age: 66 y.o. PCP: Burnis Medin, MD  Vitals:   02/15/22 1214  Weight: 140 lb (63.5 kg)     [facility] Weekly seesion - 02/15/22 1200       [facility] "PREP" Location   [facility] "PREP" Location [facility]      Weekly Session   Topic Discussed Calorie breakdown   Review macronutrients,eating healthy on a budget.   Minutes exercised this week --   reports cardio, strength training and flexability 5-7 days/week   Classes attended to date [place], [place] 02/15/2022, 12:16 PM

## 2022-02-16 ENCOUNTER — Ambulatory Visit (HOSPITAL_COMMUNITY): Payer: PPO | Admitting: Physician Assistant

## 2022-02-18 ENCOUNTER — Encounter: Payer: Self-pay | Admitting: Orthopedic Surgery

## 2022-02-18 DIAGNOSIS — M25511 Pain in right shoulder: Secondary | ICD-10-CM

## 2022-02-18 DIAGNOSIS — G8929 Other chronic pain: Secondary | ICD-10-CM | POA: Diagnosis not present

## 2022-02-18 MED ORDER — METHYLPREDNISOLONE ACETATE 40 MG/ML IJ SUSP
40.0000 mg | INTRAMUSCULAR | Status: AC | PRN
  Administered 2022-02-18: 40 mg via INTRA_ARTICULAR

## 2022-02-18 MED ORDER — LIDOCAINE HCL 1 % IJ SOLN
5.0000 mL | INTRAMUSCULAR | Status: AC | PRN
  Administered 2022-02-18: 5 mL

## 2022-02-18 NOTE — Progress Notes (Signed)
Office Visit Note   Patient: Valerie Love           Date of Birth: [birth year]           MRN: [id number] Visit Date: 02/15/2022              Requested by: Madelin Headings, MD [address] [place],  [place] [address] PCP: Madelin Headings, MD  Chief Complaint  Patient presents with   Right Shoulder - Pain      HPI: Patient is a 65 year old woman who is status post cervical epidural steroid injections with Dr. Alvester Morin.  She has had cervical radiculopathy and has had an MRI scan of her cervical spine.  Patient feels like the epidural steroid injection made her shoulder worse.  Assessment & Plan: Visit Diagnoses:  1. Chronic right shoulder pain     Plan: Right shoulder was injected she tolerated this well  Follow-Up Instructions: Return in about 4 weeks (around 03/15/2022).   Ortho Exam  Patient is alert, oriented, no adenopathy, well-dressed, normal affect, normal respiratory effort. Examination patient has abduction and flexion to 90 degrees of the right shoulder she has no radicular symptoms.  She has pain with Neer and Hawkins impingement test.  Imaging: No results found. No images are attached to the encounter.  Labs: Lab Results  Component Value Date   HGBA1C 5.6 06/22/2021   HGBA1C 5.5 08/28/2020   HGBA1C 5.6 06/10/2019   ESRSEDRATE 10 06/22/2021   ESRSEDRATE 17 04/18/2016   ESRSEDRATE 9 08/18/2010   CRP <1.0 06/22/2021   CRP 0.8 04/18/2016   GRAMSTAIN No WBC Seen 04/18/2016   GRAMSTAIN No Squamous Epithelial Cells Seen 04/18/2016   GRAMSTAIN No Organisms Seen 04/18/2016   LABORGA Few GROUP B STREP (S.AGALACTIAE) ISOLATED 04/18/2016     Lab Results  Component Value Date   ALBUMIN 4.2 06/22/2021   ALBUMIN 3.9 08/28/2020   ALBUMIN 4.2 08/08/2019    Lab Results  Component Value Date   MG 1.9 11/09/2021   MG 2.3 11/12/2018   No results found for: "VD25OH"  No results found for: "PREALBUMIN"    Latest Ref Rng & Units 11/09/2021    7:42  PM 06/22/2021   11:47 AM 08/28/2020    8:20 AM  CBC EXTENDED  WBC 4.0 - 10.5 K/uL 6.6  5.4  4.8   RBC 3.87 - 5.11 MIL/uL 4.12  4.54  4.44   Hemoglobin 12.0 - 15.0 g/dL 24.4  01.0  27.2   HCT 36.0 - 46.0 % 38.6  41.2  40.1   Platelets 150 - 400 K/uL 225  261.0  236.0   NEUT# 1.4 - 7.7 K/uL  3.1  2.7   Lymph# 0.7 - 4.0 K/uL  1.6  1.6      There is no height or weight on file to calculate BMI.  Orders:  Orders Placed This Encounter  Procedures   XR Shoulder Right   No orders of the defined types were placed in this encounter.    Procedures: Large Joint Inj: R subacromial bursa on 02/18/2022 4:55 PM Indications: diagnostic evaluation and pain Details: 22 G 1.5 in needle, posterior approach  Arthrogram: No  Medications: 5 mL lidocaine 1 %; 40 mg methylPREDNISolone acetate 40 MG/ML Outcome: tolerated well, no immediate complications Procedure, treatment alternatives, risks and benefits explained, specific risks discussed. Consent was given by the patient. Immediately prior to procedure a time out was called to verify the correct patient, procedure, equipment, support staff  and site/side marked as required. Patient was prepped and draped in the usual sterile fashion.      Clinical Data: No additional findings.  ROS:  All other systems negative, except as noted in the HPI. Review of Systems  Objective: Vital Signs: There were no vitals taken for this visit.  Specialty Comments:  EXAM: MRI CERVICAL SPINE WITHOUT CONTRAST   TECHNIQUE: Multiplanar, multisequence MR imaging of the cervical spine was performed. No intravenous contrast was administered.   COMPARISON:  Radiographs of the cervical spine 03/16/2020 (images available, report unavailable).   FINDINGS: Mild intermittent motion degradation.   Alignment: Reversal of the expected cervical lordosis. 2 mm C2-C3 grade 1 anterolisthesis. 2-3 mm C3-C4 grade 1 anterolisthesis.   Vertebrae: Vertebral body height is  maintained. Mild degenerative endplate edema at I3-J8, C5-C6, C6-C7 and C7-T1. Marrow edema within the bilateral posterior elements at C2-C3 and on the left at C3-C4, likely degenerative and related to facet arthrosis.   Cord: No signal abnormality identified within the cervical spinal cord   Posterior Fossa, vertebral arteries, paraspinal tissues: No abnormality identified within included portions of the posterior fossa. Flow voids preserved within the imaged cervical vertebral arteries. Paraspinal soft tissues unremarkable.   Disc levels:   Multilevel disc space narrowing, greatest at C4-C5 (moderate), C5-C6 (moderate/advanced), C6-C7 (moderate/advanced) and C7-T1 (moderate/advanced).   C2-C3: Grade 1 anterolisthesis. Slight disc uncovering. Facet arthrosis on the right. No significant spinal canal or foraminal stenosis.   C3-C4: Grade 1 anterolisthesis. Slight disc uncovering and disc bulge. Uncovertebral hypertrophy on the left. Facet arthrosis (greater on the left and fairly advanced on the left). No significant spinal canal stenosis. Severe left neural foraminal narrowing.   C4-C5: Disc bulge. Endplate spurring with bilateral disc osteophyte ridge/uncinate hypertrophy. A disc bulge effaces the ventral thecal sac, contacting and minimally flattening the ventral spinal cord. However, the dorsal CSF space is maintained within the spinal canal. Bilateral neural foraminal narrowing (mild right, severe left).   C5-C6: Disc bilateral disc osteophyte ridge/uncinate hypertrophy. Mild-to-moderate spinal canal stenosis. The disc bulge contacts and minimally flattens the ventral aspect of the spinal cord. Bilateral neural foraminal narrowing (severe right, mild left).   C6-C7: Disc bulge. Bilateral disc osteophyte ridge/uncinate hypertrophy. Mild spinal canal stenosis (without significant spinal cord mass effect). Severe bilateral neural foraminal narrowing.   C7-T1: Disc bulge.  Right-sided disc osteophyte ridge. Facet arthrosis. No significant spinal canal stenosis. Mild right neural foraminal narrowing.   IMPRESSION: Cervical spondylosis, as outlined and with findings most notably as follows.   At C5-C6, there is moderate/advanced disc degeneration. Disc bulge. Bilateral disc osteophyte ridge/uncinate hypertrophy. Mild to moderate spinal canal narrowing. The disc bulge contacts and minimally flattens the ventral aspect of the spinal cord. Bilateral neural foraminal narrowing (severe right, mild left).   At C4-C5, there is moderate disc degeneration. Disc bulge with bilateral disc osteophyte ridge/uncinate hypertrophy. The disc bulge contacts and minimally flattens the ventral spinal cord. However, the dorsal CSF space is maintained within the spinal canal. Bilateral neural foraminal narrowing (mild right, severe left).   No more than mild spinal canal narrowing at the remaining levels. Additional sites of foraminal stenosis, as detailed and greatest on the left at C3-C4 (severe) and bilaterally at C6-C7 (severe).   Also of note, disc degeneration is moderate/advanced at C6-C7 and C7-T1.   Degenerative marrow edema as described.   Nonspecific reversal of expected cervical lordosis. Grade 1 anterolisthesis at C2-C3 and C3-C4.     Electronically Signed   By:  Jackey Loge D.O.   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   Unilateral primary osteoarthritis, right knee    Special screening for malignant neoplasms,  colon 12/29/2014   Diarrhea 12/29/2014   Medication management 05/13/2013   Does not have health insurance 05/13/2013   Adjustment reaction with anxiety and depression 03/12/2012   Medication monitoring encounter 03/12/2012   Hemorrhoids 08/13/2011   Perineal itching, female 08/13/2011   LBBB (left bundle branch block) 09/24/2010   Murmur 09/08/2010   EKG, abnormal 08/19/2010   Visit for preventive health examination 08/19/2010   Palpitations 08/18/2010   Dizziness 08/18/2010   Arthritis 08/18/2010   Anxiety state 06/18/2008   GRIEF REACTION 06/18/2008   DEPRESSION, MAJOR, RECURRENT 07/06/2006   ARTHRALGIA, UNSPECIFIED 07/06/2006   Past Medical History:  Diagnosis Date   Anxiety    Arthritis    R knee, back, hands    Cellulitis of knee 04/2016   RT KNEE   Cellulitis of right knee 04/18/2016   Childhood asthma    Complication of anesthesia    woke up slowly - 76yrs. ago   Depression    related to husband illness and death   History of jaundice    as a child     Hx of varicella    Hyperthyroidism    during pregnancy, treated & resolved post partum    IBS (irritable bowel syndrome)    LBBB (left bundle branch block)    Scoliosis    Scoliosis 07/2015   Thyroid disease in pregnancy    TOBACCO DEPENDENCE 07/06/2006   Qualifier: Diagnosis of  By: Knox Royalty     Troponin level elevated     Family History  Problem Relation Age of Onset   Alcohol abuse Mother        died from stomach ulcers   Stroke Father        died  from cva and mi   Hypertension Father    Hyperlipidemia Father    Coronary artery disease Father        Age 21   Colon polyps Sister    Hepatitis C Brother    Colon cancer Neg Hx     Past Surgical History:  Procedure Laterality Date   BREAST BIOPSY Right 1979   benign    Cyst removed from ovary     KNEE SURGERY     right Adrian Specht    LEFT HEART CATH AND CORONARY ANGIOGRAPHY N/A 10/31/2018   Procedure: LEFT HEART CATH AND CORONARY ANGIOGRAPHY;   Surgeon: Lennette Bihari, MD;  Location: [facility];  Service: Cardiovascular;  Laterality: N/A;   TONSILLECTOMY AND ADENOIDECTOMY  1073   TOTAL KNEE ARTHROPLASTY 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   Procedure: TOTAL KNEE ARTHROPLASTY;  Surgeon: [name], MD;  Location: [facility] OR;  Service: Orthopedics;  Laterality: Right;   TUBAL LIGATION     WISDOM TOOTH EXTRACTION     Social History   Occupational History   Occupation: self    Comment: Catering and cleaning  Tobacco Use   Smoking status: Former    Packs/day: 0.25  Years: 40.00    Total pack years: 10.00    Types: Cigarettes    Quit date: 01/06/2016    Years since quitting: 6.1   Smokeless tobacco: Never   Tobacco comments:    Former smoker 11/16/21  Substance and Sexual Activity   Alcohol use: Yes    Alcohol/week: 7.0 standard drinks of alcohol    Types: 7 Glasses of wine per week    Comment: 1 glass of wine daily 11/16/21   Drug use: No   Sexual activity: Not on file

## 2022-02-21 ENCOUNTER — Telehealth: Payer: Self-pay | Admitting: Cardiovascular Disease

## 2022-02-21 NOTE — Telephone Encounter (Signed)
Left a message for the patient to call back.  

## 2022-02-21 NOTE — Telephone Encounter (Signed)
Patient c/o Palpitations:  High priority if patient c/o lightheadedness, shortness of breath, or chest pain  How long have you had palpitations/irregular HR/ Afib? Are you having the symptoms now? 2 hrs.; yes   Are you currently experiencing lightheadedness, SOB or CP? No   Do you have a history of afib (atrial fibrillation) or irregular heart rhythm? Yes   Have you checked your BP or HR? (document readings if available): yes   Are you experiencing any other symptoms? Heart fluttering; headache

## 2022-02-22 ENCOUNTER — Telehealth: Payer: Self-pay | Admitting: Cardiovascular Disease

## 2022-02-22 MED ORDER — DILTIAZEM HCL ER COATED BEADS 180 MG PO CP24
180.0000 mg | ORAL_CAPSULE | Freq: Every day | ORAL | 3 refills | Status: DC
  Filled 2022-05-30 – 2022-06-08 (×2): qty 90, 90d supply, fill #0
  Filled 2022-08-21: qty 90, 90d supply, fill #1
  Filled 2022-12-03: qty 70, 70d supply, fill #2

## 2022-02-22 NOTE — Telephone Encounter (Signed)
Patient c/o Palpitations:  High priority if patient c/o lightheadedness, shortness of breath, or chest pain  How long have you had palpitations/irregular HR/ Afib? Are you having the symptoms now?   No  Are you currently experiencing lightheadedness, SOB or CP?   No  Do you have a history of afib (atrial fibrillation) or irregular heart rhythm?  Yes  Have you checked your BP or HR? (document readings if available):   HR (while on phone) 120 pm  Are you experiencing any other symptoms?   Bad headache    Patient stated this episode started yesterday afternoon and lasted about 3 hours.  Patient stated she had a lot of flutters.  Patient stated just a heaviness in her chest.

## 2022-02-22 NOTE — Telephone Encounter (Signed)
Patient reports having afib, not feeling well, and having a "terrific headache". Heart rate 128, BP 148/98. Denies sob, dizziness, or lightheadedness. Dr. Gwenlyn Found advised and ordered patient to take an extra diltiazem 120mg  now and to start taking diltiazem 180mg  daily tomorrow. Also, order to see if afib clinic appointment can be moved to today. Afib appointment for 10/23 at 3. Patient informed of the new orders. She took the extra diltiazem 120mg  and verbalized understanding of new medication order and appointment for afib clinic. Patient asked if the increased dosage of diltiazem would make her tired or drowsy to where she couldn't drive. Explained to wait 1-2 hours to see how she feels. If she feels more tired/drowsy than normal, then not to drive. She verbalized understanding. Prescription sent for diltiazem 180mg  daily.

## 2022-02-24 NOTE — Telephone Encounter (Signed)
Followed up with patient who stated she is tolerating the increased dose of diltiazem 180mg . She still reports headache. She is taking Tylenol as needed. Advised to avoid EtOH with Tylenol. She will keep 10/23 appointment at [place] [facility]. She asked is she could start walking again, now that her palpitations have stopped. Recommended she start walking again, but to start out slowly.

## 2022-02-28 ENCOUNTER — Encounter (HOSPITAL_COMMUNITY): Payer: Self-pay | Admitting: Physician Assistant

## 2022-02-28 ENCOUNTER — Ambulatory Visit (HOSPITAL_COMMUNITY)
Admission: RE | Admit: 2022-02-28 | Discharge: 2022-02-28 | Disposition: A | Discharge status: Home / Self Care | Payer: PPO | Source: Ambulatory Visit | Attending: Physician Assistant | Admitting: Physician Assistant

## 2022-02-28 VITALS — BP 134/88 | HR 65 | Ht 61.0 in | Wt 141.2 lb

## 2022-02-28 DIAGNOSIS — I5022 Chronic systolic (congestive) heart failure: Secondary | ICD-10-CM | POA: Diagnosis not present

## 2022-02-28 DIAGNOSIS — Z79899 Other long term (current) drug therapy: Secondary | ICD-10-CM | POA: Insufficient documentation

## 2022-02-28 DIAGNOSIS — E785 Hyperlipidemia, unspecified: Secondary | ICD-10-CM | POA: Diagnosis not present

## 2022-02-28 DIAGNOSIS — I252 Old myocardial infarction: Secondary | ICD-10-CM | POA: Diagnosis not present

## 2022-02-28 DIAGNOSIS — D6869 Other thrombophilia: Secondary | ICD-10-CM | POA: Insufficient documentation

## 2022-02-28 DIAGNOSIS — Z7901 Long term (current) use of anticoagulants: Secondary | ICD-10-CM | POA: Diagnosis not present

## 2022-02-28 DIAGNOSIS — I480 Paroxysmal atrial fibrillation: Secondary | ICD-10-CM | POA: Insufficient documentation

## 2022-02-28 MED ORDER — DILTIAZEM HCL 30 MG PO TABS: ORAL_TABLET | ORAL | 1 refills | Status: DC

## 2022-02-28 NOTE — Progress Notes (Signed)
Primary Care Physician: Madelin Headings, MD Primary Cardiologist: Dr Allyson Sabal Primary Electrophysiologist: Dr Elberta Fortis Referring Physician: Dr [place]   Valerie Love is a 66 y.o. female with a history of LV dysfunction, NSTEMI w/ normal coronaries 2020, HLD, atrial fibrillation who presents for follow up in the [facility].  The patient was initially diagnosed with atrial fibrillation 11/09/21 after presenting with symptoms of heart racing, weakness, and jaw discomfort. She went to the fire department down the street where she was found to be in atrial fibrillation with rate as high as 200 and was brought to our ED via EMS.  She was given a bolus of diltiazem and started on an infusion which controlled her rates.  Her acute symptoms resolved with rate control. She spontaneously converted to SR and was discharged on diltiazem and Eliquis for a CHADS2VASC score of 3.   On follow up today, patient had an episode of tachypalpitations on 02/21/22 and her diltiazem was increased. There were no specific triggers that the patient could identify.   Today, she denies symptoms of chest pain, shortness of breath, orthopnea, PND, lower extremity edema, dizziness, presyncope, syncope, snoring, daytime somnolence, bleeding, or neurologic sequela. The patient is tolerating medications without difficulties and is otherwise without complaint today.    Atrial Fibrillation Risk Factors:  she does not have symptoms or diagnosis of sleep apnea. she does not have a history of rheumatic fever. she does have a history of alcohol use. The patient does not have a history of early familial atrial fibrillation or other arrhythmias.  she has a BMI of Body mass index is 26.68 kg/m.Marland Kitchen Filed Weights   02/28/22 1443  Weight: 64 kg    Family History  Problem Relation Age of Onset   Alcohol abuse Mother        died from stomach ulcers   Stroke Father        died  from cva and mi    Hypertension Father    Hyperlipidemia Father    Coronary artery disease Father        Age 76   Colon polyps Sister    Hepatitis C Brother    Colon cancer Neg Hx      Atrial Fibrillation Management history:  Previous antiarrhythmic drugs: none Previous cardioversions: none Previous ablations: none CHADS2VASC score: 3 Anticoagulation history: Eliquis   Past Medical History:  Diagnosis Date   Anxiety    Arthritis    R knee, back, hands    Cellulitis of knee 04/2016   RT KNEE   Cellulitis of right knee 04/18/2016   Childhood asthma    Complication of anesthesia    woke up slowly - 55yrs. ago   Depression    related to husband illness and death   History of jaundice    as a child     Hx of varicella    Hyperthyroidism    during pregnancy, treated & resolved post partum    IBS (irritable bowel syndrome)    LBBB (left bundle branch block)    Scoliosis    Scoliosis 07/2015   Thyroid disease in pregnancy    TOBACCO DEPENDENCE 07/06/2006   Qualifier: Diagnosis of  By: Knox Royalty     Troponin level elevated    Past Surgical History:  Procedure Laterality Date   BREAST BIOPSY Right 1979   benign    Cyst removed from ovary     KNEE SURGERY     right  duda    LEFT HEART CATH AND CORONARY ANGIOGRAPHY N/A 10/31/2018   Procedure: LEFT HEART CATH AND CORONARY ANGIOGRAPHY;  Surgeon: Lennette Bihari, MD;  Location: [facility];  Service: Cardiovascular;  Laterality: N/A;   TONSILLECTOMY AND ADENOIDECTOMY  1073   TOTAL KNEE ARTHROPLASTY 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   Procedure: TOTAL KNEE ARTHROPLASTY;  Surgeon: [name], MD;  Location: [facility];  Service: Orthopedics;  Laterality: Right;   TUBAL LIGATION     WISDOM TOOTH EXTRACTION      Current Outpatient Medications  Medication Sig Dispense Refill   acetaminophen (TYLENOL) 325 MG tablet Take 2 tablets (650 mg total) by mouth every 4 (four) hours as needed for headache, mild pain or moderate pain.     albuterol (VENTOLIN HFA)  108 (90 Base) MCG/ACT inhaler Inhale 2 puffs into the lungs every 6 (six) hours as needed for wheezing or shortness of breath. 8.5 g 1   ALPRAZolam (XANAX) 0.5 MG tablet TAKE 1 TABLET BY MOUTH THREE TIMES A DAY AS NEEDED FOR ANXIETY. MAXIMUM 3 TALBETS PER 24 HOURS 30 tablet 0   apixaban (ELIQUIS) 5 MG TABS tablet Take 1 tablet (5 mg total) by mouth 2 (two) times daily. 180 tablet 3   Calcium Carb-Cholecalciferol (CALCIUM+D3) 600-800 MG-UNIT TABS Take 1 tablet by mouth daily with breakfast.     citalopram (CELEXA) 20 MG tablet Take 1 tablet by mouth once daily 90 tablet 0   diltiazem (CARDIZEM CD) 180 MG 24 hr capsule Take 1 capsule (180 mg total) by mouth daily. 90 capsule 3   diphenhydrAMINE (BENADRYL) 25 MG tablet Take 25 mg by mouth at bedtime as needed for allergies or sleep.      Multiple Vitamins-Minerals (ALIVE ONCE DAILY WOMENS 50+) TABS Take 1 tablet by mouth daily.     Probiotic Product (TRUBIOTICS) CAPS Take 1 capsule by mouth daily.     rosuvastatin (CRESTOR) 5 MG tablet Take 1 tablet by mouth once daily 90 tablet 0   No current facility-administered medications for this encounter.    Allergies  Allergen Reactions   Codeine Itching, Swelling, Rash and Other (See Comments)    Hives and Vomiting   Lactose Intolerance (Gi) Diarrhea    Social History   Socioeconomic History   Marital status: Widowed    Spouse name: Not on file   Number of children: 3   Years of education: Not on file   Highest education level: Not on file  Occupational History   Occupation: self    Comment: Catering and cleaning  Tobacco Use   Smoking status: Former    Packs/day: 0.25    Years: 40.00    Total pack years: 10.00    Types: Cigarettes    Quit date: 01/06/2016    Years since quitting: 6.1   Smokeless tobacco: Never   Tobacco comments:    Former smoker 11/16/21  Substance and Sexual Activity   Alcohol use: Yes    Alcohol/week: 7.0 standard drinks of alcohol    Types: 7 Glasses of wine  per week    Comment: 1 glass of wine daily 11/16/21   Drug use: No   Sexual activity: Not on file  Other Topics Concern   Not on file  Social History Narrative   Husband passed away on 11-Mar-2008 hep c and cirrhosis.   She is hep c negative 2006   G3P3   HH of 2-3  son no pets    No falls .  Has  smoke detector and wears seat belts. POsitive  firearms. No excess sun exposure.    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   Had time of nono insuranced and cannot affort 800 per month for catastrophic    Stopped tobacco fall 2017 . ocass etoh.  No rec drugs.   Daughter patient in our practice   [place] Co[place]   Social Determinants of Health   Financial Resource Strain: Low Risk  (08/11/2021)   Overall Financial Resource Strain (CARDIA)    Difficulty of Paying Living Expenses: Not hard at all  Food Insecurity: No Food Insecurity (08/11/2021)   Hunger Vital Sign    Worried About Running Out of Food in the Last Year: Never true    Ran Out of Food in the Last Year: Never true  Transportation Needs: No Transportation Needs (08/11/2021)   PRAPARE - Hydrologist (Medical): No    Lack of Transportation (Non-Medical): No  Physical Activity: Sufficiently Active (08/11/2021)   Exercise Vital Sign    Days of Exercise per Week: 3 days    Minutes of Exercise per Session: 60 min  Stress: No Stress Concern Present (08/11/2021)   [place]    Feeling of Stress : Not at all  Social Connections: Moderately Integrated (08/11/2021)   Social Connection and Isolation Panel [NHANES]    Frequency of Communication with Friends and Family: More than three times a week    Frequency of Social Gatherings with Friends and Family: More than three times a week    Attends Religious Services: More than 4 times per year    Active Member of [employer] or Organizations: Yes    Attends Archivist Meetings: More than  4 times per year    Marital Status: Widowed  Intimate Partner Violence: Not At Risk (08/11/2021)   Humiliation, Afraid, Rape, and Kick questionnaire    Fear of Current or Ex-Partner: No    Emotionally Abused: No    Physically Abused: No    Sexually Abused: No     ROS- All systems are reviewed and negative except as per the HPI above.  Physical Exam: Vitals:   02/28/22 1443  BP: 134/88  Pulse: 65  Weight: 64 kg  Height: 5\' 1"  (1.549 m)    GEN- The patient is a well appearing female, alert and oriented x 3 today.   HEENT-head normocephalic, atraumatic, sclera clear, conjunctiva pink, hearing intact, trachea midline. Lungs- Clear to ausculation bilaterally, normal work of breathing Heart- Regular rate and rhythm, no murmurs, rubs or gallops  GI- soft, NT, ND, + BS Extremities- no clubbing, cyanosis, or edema MS- no significant deformity or atrophy Skin- no rash or lesion Psych- euthymic mood, full affect Neuro- strength and sensation are intact   Wt Readings from Last 3 Encounters:  02/28/22 64 kg  02/15/22 63.5 kg  02/08/22 64 kg    EKG today demonstrates  SR, LBBB Vent. rate 65 BPM PR interval 156 ms QRS duration 138 ms QT/QTcB 462/480 ms  Echo 11/10/21 demonstrated   1. Left ventricular ejection fraction, by estimation, is 55 to 60%. The  left ventricle has normal function. The left ventricle has no regional  wall motion abnormalities. Left ventricular diastolic parameters are  consistent with Grade I diastolic dysfunction (impaired relaxation).   2. Right ventricular systolic function is normal. The right ventricular  size is normal. There is normal pulmonary  artery systolic pressure.   3. The mitral valve is normal in structure. Mild mitral valve  regurgitation. No evidence of mitral stenosis.   4. The aortic valve is normal in structure. Aortic valve regurgitation is mild. No aortic stenosis is present.   5. The inferior vena cava is dilated in size with >50%  respiratory  variability, suggesting right atrial pressure of 8 mmHg.   Epic records are reviewed at length today  CHA2DS2-VASc Score = 3  The patient's score is based upon: CHF History: 1 (EF recovered) HTN History: 0 Diabetes History: 0 Stroke History: 0 Vascular Disease History: 0 Age Score: 1 Gender Score: 1       ASSESSMENT AND PLAN: 1. Paroxysmal Atrial Fibrillation (ICD10:  I48.0) The patient's CHA2DS2-VASc score is 3, indicating a 3.2% annual risk of stroke.   We discussed rhythm control options today. She is ready to schedule ablation with Dr Elberta Fortis now, will send message to his office.  Continue Eliquis 5 mg BID Continue diltiazem 180 mg daily Will start diltiazem 30 mg PRN q 4 hours for heart racing.   2. Secondary Hypercoagulable State (ICD10:  D68.69) The patient is at significant risk for stroke/thromboembolism based upon her CHA2DS2-VASc Score of 3.  Continue Apixaban (Eliquis).   3. 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    Jorja Loa PA-C [facility] [address] [place], [place] [address] [phone] 02/28/2022 3:05 PM

## 2022-03-01 ENCOUNTER — Encounter: Payer: Self-pay | Admitting: *Deleted

## 2022-03-01 ENCOUNTER — Ambulatory Visit (HOSPITAL_COMMUNITY): Payer: PPO | Admitting: Physician Assistant

## 2022-03-01 NOTE — Progress Notes (Signed)
[facility] PREP Weekly Session  Patient Details  Name: Valerie Love MRN: [id number] Date of Birth: 1955-06-04 Age: 66 y.o. PCP: Burnis Medin, MD  There were no vitals filed for this visit.   [facility] Weekly seesion - 03/01/22 1205       Weekly Session   Topic Discussed Other   Final class, Fit and strong survey, FIT Testing, [employer] talk. Final questions.   Minutes exercised this week --   Did not record her exercise in minutes, but reports cardio, strength, flexability this past week.   Classes attended to date [place], [place] 03/01/2022, 12:52 PM

## 2022-03-03 ENCOUNTER — Encounter: Payer: Self-pay | Admitting: *Deleted

## 2022-03-03 NOTE — Progress Notes (Signed)
[facility] PREP Evaluation  Patient Details  Name: Valerie Love MRN: [id number] Date of Birth: Jan 03, 1956 Age: 66 y.o. PCP: Madelin Headings, MD  Vitals:   03/03/22 0945  BP: (!) 106/48  Pulse: 72  Resp: 20  SpO2: 96%  Weight: 139 lb 12.8 oz (63.4 kg)     [facility] Eval - 03/03/22 0945       [facility] "PREP" Location   [facility] "PREP" Location [facility]      Referral    Referring Provider [place]    Reason for referral Heart Failure   A-fib   Program Start Date 12/14/21      Measurement   Waist Circumference 35 inches    Hip Circumference 39.5 inches    Body fat 31.4 percent      Mobility and Daily Activities   I find it easy to walk up or down two or more flights of stairs. 3    I have no trouble taking out the trash. 4    I do housework such as vacuuming and dusting on my own without difficulty. 4    I can easily lift a gallon of milk (8lbs). 4    I can easily walk a mile. 4    I have no trouble reaching into high cupboards or reaching down to pick up something from the floor. 4    I do not have trouble doing out-door work such as Loss adjuster, chartered, raking leaves, or gardening. 3      Mobility and Daily Activities   I feel younger than my age. 3    I feel independent. 4    I feel energetic. 3    I live an active life.  4    I feel strong. 4    I feel healthy. 4    I feel active as other people my age. 4      How fit and strong are you.   Fit and Strong Total Score 52            Past Medical History:  Diagnosis Date   Anxiety    Arthritis    R knee, back, hands    Cellulitis of knee 04/2016   RT KNEE   Cellulitis of right knee 04/18/2016   Childhood asthma    Complication of anesthesia    woke up slowly - 85yrs. ago   Depression    related to husband illness and death   History of jaundice    as a child     Hx of varicella    Hyperthyroidism    during pregnancy, treated & resolved post partum    IBS (irritable bowel syndrome)    LBBB (left bundle  branch block)    Scoliosis    Scoliosis 07/2015   Thyroid disease in pregnancy    TOBACCO DEPENDENCE 07/06/2006   Qualifier: Diagnosis of  By: Knox Royalty     Troponin level elevated    Past Surgical History:  Procedure Laterality Date   BREAST BIOPSY Right 1979   benign    Cyst removed from ovary     KNEE SURGERY     right duda    LEFT HEART CATH AND CORONARY ANGIOGRAPHY N/A 10/31/2018   Procedure: LEFT HEART CATH AND CORONARY ANGIOGRAPHY;  Surgeon: Lennette Bihari, MD;  [facility];  Service: Cardiovascular;  Laterality: N/A;   TONSILLECTOMY AND ADENOIDECTOMY  1073   TOTAL KNEE ARTHROPLASTY 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   Procedure:  TOTAL KNEE ARTHROPLASTY;  Surgeon: Newt Minion, MD;  Location: [facility];  Service: Orthopedics;  Laterality: Right;   TUBAL LIGATION     WISDOM TOOTH EXTRACTION     Social History   Tobacco Use  Smoking Status Former   Packs/day: 0.25   Years: 40.00   Total pack years: 10.00   Types: Cigarettes   Quit date: 01/06/2016   Years since quitting: 6.1  Smokeless Tobacco Never  Tobacco Comments   Former smoker 11/16/21    [name] 03/03/2022, 5:02 PM  Completed PREP. Attended 10 of 12 education classes and 10 of 11 exercise classes. Improved How Fit and Strong survey by 13 points! Weight loss 8.6lbs Hip/Waist ratio decreased by 4.5 inches. Significant improvement in exercise tolerance and confidence in her ability to be active with underlying heart condition.Well done!

## 2022-03-07 ENCOUNTER — Other Ambulatory Visit: Payer: Self-pay | Admitting: Cardiovascular Disease

## 2022-03-07 DIAGNOSIS — E7800 Pure hypercholesterolemia, unspecified: Secondary | ICD-10-CM

## 2022-03-15 ENCOUNTER — Ambulatory Visit: Payer: PPO | Admitting: Orthopedic Surgery

## 2022-03-15 ENCOUNTER — Encounter: Payer: Self-pay | Admitting: Orthopedic Surgery

## 2022-03-15 ENCOUNTER — Telehealth: Payer: Self-pay | Admitting: Cardiology

## 2022-03-15 DIAGNOSIS — G8929 Other chronic pain: Secondary | ICD-10-CM | POA: Diagnosis not present

## 2022-03-15 DIAGNOSIS — M25511 Pain in right shoulder: Secondary | ICD-10-CM | POA: Diagnosis not present

## 2022-03-15 MED ORDER — LIDOCAINE HCL 1 % IJ SOLN
5.0000 mL | INTRAMUSCULAR | Status: AC | PRN
  Administered 2022-03-15: 5 mL

## 2022-03-15 MED ORDER — METHYLPREDNISOLONE ACETATE 40 MG/ML IJ SUSP
40.0000 mg | INTRAMUSCULAR | Status: AC | PRN
  Administered 2022-03-15: 40 mg via INTRA_ARTICULAR

## 2022-03-15 NOTE — Telephone Encounter (Signed)
Patient called stating she would like to get on Dr. Curt Bears schedule for an ablation.

## 2022-03-15 NOTE — Progress Notes (Signed)
Office Visit Note   Patient: Valerie Love           Date of Birth: 1956-02-08           MRN: [id number] Visit Date: 03/15/2022              Requested by: Madelin Headings, MD [address] [place],  [place] [address] PCP: Madelin Headings, MD  Chief Complaint  Patient presents with   Right Shoulder - Follow-up      HPI: Patient is a 66 year old woman who presents in follow-up for impingement symptoms right shoulder.  She had interval relief with the subacromial injection last month.  She states she is not 100% better.  Assessment & Plan: Visit Diagnoses:  1. Chronic right shoulder pain     Plan: Patient underwent repeat injection.  She was given instructions and demonstrated internal and external rotation strengthening and scapular stabilization.  Follow-Up Instructions: Return if symptoms worsen or fail to improve.   Ortho Exam  Patient is alert, oriented, no adenopathy, well-dressed, normal affect, normal respiratory effort. Examination patient has active abduction and flexion to 120 degrees she does have symptoms with Neer and Hawkins impingement test.  The [facility] joint is not tender to palpation.  Biceps tendon is minimally tender to palpation.  Imaging: No results found. No images are attached to the encounter.  Labs: Lab Results  Component Value Date   HGBA1C 5.6 06/22/2021   HGBA1C 5.5 08/28/2020   HGBA1C 5.6 06/10/2019   ESRSEDRATE 10 06/22/2021   ESRSEDRATE 17 04/18/2016   ESRSEDRATE 9 08/18/2010   CRP <1.0 06/22/2021   CRP 0.8 04/18/2016   GRAMSTAIN No WBC Seen 04/18/2016   GRAMSTAIN No Squamous Epithelial Cells Seen 04/18/2016   GRAMSTAIN No Organisms Seen 04/18/2016   LABORGA Few GROUP B STREP (S.AGALACTIAE) ISOLATED 04/18/2016     Lab Results  Component Value Date   ALBUMIN 4.2 06/22/2021   ALBUMIN 3.9 08/28/2020   ALBUMIN 4.2 08/08/2019    Lab Results  Component Value Date   MG 1.9 11/09/2021   MG 2.3 11/12/2018   No results  found for: "VD25OH"  No results found for: "PREALBUMIN"    Latest Ref Rng & Units 11/09/2021    7:42 PM 06/22/2021   11:47 AM 08/28/2020    8:20 AM  CBC EXTENDED  WBC 4.0 - 10.5 K/uL 6.6  5.4  4.8   RBC 3.87 - 5.11 MIL/uL 4.12  4.54  4.44   Hemoglobin 12.0 - 15.0 g/dL 81.8  56.3  14.9   HCT 36.0 - 46.0 % 38.6  41.2  40.1   Platelets 150 - 400 K/uL 225  261.0  236.0   NEUT# 1.4 - 7.7 K/uL  3.1  2.7   Lymph# 0.7 - 4.0 K/uL  1.6  1.6      There is no height or weight on file to calculate BMI.  Orders:  No orders of the defined types were placed in this encounter.  No orders of the defined types were placed in this encounter.    Procedures: Large Joint Inj: R subacromial bursa on 03/15/2022 3:57 PM Indications: diagnostic evaluation and pain Details: 22 G 1.5 in needle  Arthrogram: No  Medications: 5 mL lidocaine 1 %; 40 mg methylPREDNISolone acetate 40 MG/ML Outcome: tolerated well, no immediate complications Procedure, treatment alternatives, risks and benefits explained, specific risks discussed. Consent was given by the patient. Immediately prior to procedure a time out was called to verify  the correct patient, procedure, equipment, support staff and site/side marked as required. Patient was prepped and draped in the usual sterile fashion.      Clinical Data: No additional findings.  ROS:  All other systems negative, except as noted in the HPI. Review of Systems  Objective: Vital Signs: There were no vitals taken for this visit.  Specialty Comments:  EXAM: MRI CERVICAL SPINE WITHOUT CONTRAST   TECHNIQUE: Multiplanar, multisequence MR imaging of the cervical spine was performed. No intravenous contrast was administered.   COMPARISON:  Radiographs of the cervical spine 03/16/2020 (images available, report unavailable).   FINDINGS: Mild intermittent motion degradation.   Alignment: Reversal of the expected cervical lordosis. 2 mm C2-C3 grade 1  anterolisthesis. 2-3 mm C3-C4 grade 1 anterolisthesis.   Vertebrae: Vertebral body height is maintained. Mild degenerative endplate edema at E7-O3, C5-C6, C6-C7 and C7-T1. Marrow edema within the bilateral posterior elements at C2-C3 and on the left at C3-C4, likely degenerative and related to facet arthrosis.   Cord: No signal abnormality identified within the cervical spinal cord   Posterior Fossa, vertebral arteries, paraspinal tissues: No abnormality identified within included portions of the posterior fossa. Flow voids preserved within the imaged cervical vertebral arteries. Paraspinal soft tissues unremarkable.   Disc levels:   Multilevel disc space narrowing, greatest at C4-C5 (moderate), C5-C6 (moderate/advanced), C6-C7 (moderate/advanced) and C7-T1 (moderate/advanced).   C2-C3: Grade 1 anterolisthesis. Slight disc uncovering. Facet arthrosis on the right. No significant spinal canal or foraminal stenosis.   C3-C4: Grade 1 anterolisthesis. Slight disc uncovering and disc bulge. Uncovertebral hypertrophy on the left. Facet arthrosis (greater on the left and fairly advanced on the left). No significant spinal canal stenosis. Severe left neural foraminal narrowing.   C4-C5: Disc bulge. Endplate spurring with bilateral disc osteophyte ridge/uncinate hypertrophy. A disc bulge effaces the ventral thecal sac, contacting and minimally flattening the ventral spinal cord. However, the dorsal CSF space is maintained within the spinal canal. Bilateral neural foraminal narrowing (mild right, severe left).   C5-C6: Disc bilateral disc osteophyte ridge/uncinate hypertrophy. Mild-to-moderate spinal canal stenosis. The disc bulge contacts and minimally flattens the ventral aspect of the spinal cord. Bilateral neural foraminal narrowing (severe right, mild left).   C6-C7: Disc bulge. Bilateral disc osteophyte ridge/uncinate hypertrophy. Mild spinal canal stenosis (without significant  spinal cord mass effect). Severe bilateral neural foraminal narrowing.   C7-T1: Disc bulge. Right-sided disc osteophyte ridge. Facet arthrosis. No significant spinal canal stenosis. Mild right neural foraminal narrowing.   IMPRESSION: Cervical spondylosis, as outlined and with findings most notably as follows.   At C5-C6, there is moderate/advanced disc degeneration. Disc bulge. Bilateral disc osteophyte ridge/uncinate hypertrophy. Mild to moderate spinal canal narrowing. The disc bulge contacts and minimally flattens the ventral aspect of the spinal cord. Bilateral neural foraminal narrowing (severe right, mild left).   At C4-C5, there is moderate disc degeneration. Disc bulge with bilateral disc osteophyte ridge/uncinate hypertrophy. The disc bulge contacts and minimally flattens the ventral spinal cord. However, the dorsal CSF space is maintained within the spinal canal. Bilateral neural foraminal narrowing (mild right, severe left).   No more than mild spinal canal narrowing at the remaining levels. Additional sites of foraminal stenosis, as detailed and greatest on the left at C3-C4 (severe) and bilaterally at C6-C7 (severe).   Also of note, disc degeneration is moderate/advanced at C6-C7 and C7-T1.   Degenerative marrow edema as described.   Nonspecific reversal of expected cervical lordosis. Grade 1 anterolisthesis at C2-C3 and C3-C4.  Electronically Signed   By: Jackey Loge D.O.   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    Unilateral primary osteoarthritis, right knee    Special screening for malignant neoplasms, colon 12/29/2014   Diarrhea 12/29/2014   Medication management 05/13/2013   Does not have health insurance 05/13/2013   Adjustment reaction with anxiety and depression 03/12/2012   Medication monitoring encounter 03/12/2012   Hemorrhoids 08/13/2011   Perineal itching, female 08/13/2011   LBBB (left bundle branch block) 09/24/2010   Murmur 09/08/2010   EKG, abnormal 08/19/2010   Visit for preventive health examination 08/19/2010   Palpitations 08/18/2010   Dizziness 08/18/2010   Arthritis 08/18/2010   Anxiety state 06/18/2008   GRIEF REACTION 06/18/2008   DEPRESSION, MAJOR, RECURRENT 07/06/2006   ARTHRALGIA, UNSPECIFIED 07/06/2006   Past Medical History:  Diagnosis Date   Anxiety    Arthritis    R knee, back, hands    Cellulitis of knee 04/2016   RT KNEE   Cellulitis of right knee 04/18/2016   Childhood asthma    Complication of anesthesia    woke up slowly - 14yrs. ago   Depression    related to husband illness and death   History of jaundice    as a child     Hx of varicella    Hyperthyroidism    during pregnancy, treated & resolved post partum    IBS (irritable bowel syndrome)    LBBB (left bundle branch block)    Scoliosis    Scoliosis 07/2015   Thyroid disease in pregnancy    TOBACCO DEPENDENCE 07/06/2006   Qualifier: Diagnosis of  By: Knox Royalty     Troponin level elevated     Family History  Problem Relation Age of Onset   Alcohol abuse Mother        died from stomach ulcers   Stroke Father        died  from cva and mi   Hypertension Father    Hyperlipidemia Father    Coronary artery disease Father        Age 25   Colon polyps Sister    Hepatitis C Brother    Colon cancer Neg Hx     Past Surgical History:  Procedure Laterality Date   BREAST BIOPSY Right 1979   benign    Cyst removed from ovary     KNEE SURGERY     right Kathalene Sporer    LEFT HEART CATH AND  CORONARY ANGIOGRAPHY N/A 10/31/2018   Procedure: LEFT HEART CATH AND CORONARY ANGIOGRAPHY;  Surgeon: Lennette Bihari, MD;  [facility];  Service: Cardiovascular;  Laterality: N/A;   TONSILLECTOMY AND ADENOIDECTOMY  1073   TOTAL KNEE ARTHROPLASTY 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   Procedure: TOTAL KNEE ARTHROPLASTY;  Surgeon: [name], MD;  Location: [facility];  Service: Orthopedics;  Laterality: Right;   TUBAL LIGATION     WISDOM TOOTH EXTRACTION     Social History   Occupational History   Occupation: self    Comment: Catering and cleaning  Tobacco Use   Smoking status: Former  Packs/day: 0.25    Years: 40.00    Total pack years: 10.00    Types: Cigarettes    Quit date: 01/06/2016    Years since quitting: 6.1   Smokeless tobacco: Never   Tobacco comments:    Former smoker 11/16/21  Substance and Sexual Activity   Alcohol use: Yes    Alcohol/week: 7.0 standard drinks of alcohol    Types: 7 Glasses of wine per week    Comment: 1 glass of wine daily 11/16/21   Drug use: No   Sexual activity: Not on file

## 2022-03-15 NOTE — Telephone Encounter (Signed)
Pt is scheduled for Afib Ablation with Dr. Curt Bears on 08/10/21 @ 7:30am.

## 2022-03-18 ENCOUNTER — Other Ambulatory Visit: Payer: Self-pay

## 2022-03-18 DIAGNOSIS — I480 Paroxysmal atrial fibrillation: Secondary | ICD-10-CM

## 2022-03-18 NOTE — Telephone Encounter (Signed)
LM for pt to call back to discuss instructions regarding her Ablation.

## 2022-05-10 ENCOUNTER — Other Ambulatory Visit: Payer: Self-pay | Admitting: Family

## 2022-05-10 ENCOUNTER — Other Ambulatory Visit: Payer: Self-pay | Admitting: Internal Medicine

## 2022-05-10 ENCOUNTER — Other Ambulatory Visit (HOSPITAL_COMMUNITY): Payer: Self-pay

## 2022-05-10 MED ORDER — CITALOPRAM HYDROBROMIDE 20 MG PO TABS
20.0000 mg | ORAL_TABLET | Freq: Every day | ORAL | 0 refills | Status: DC
  Filled 2022-05-10: qty 90, 90d supply, fill #0

## 2022-05-10 MED ORDER — CITALOPRAM HYDROBROMIDE 20 MG PO TABS: 20.0000 mg | ORAL_TABLET | Freq: Every day | ORAL | 0 refills | Status: DC

## 2022-05-11 ENCOUNTER — Other Ambulatory Visit: Payer: Self-pay | Admitting: Family

## 2022-05-30 ENCOUNTER — Other Ambulatory Visit (HOSPITAL_BASED_OUTPATIENT_CLINIC_OR_DEPARTMENT_OTHER): Payer: Self-pay

## 2022-06-06 ENCOUNTER — Encounter: Payer: Self-pay | Admitting: Cardiology

## 2022-06-08 ENCOUNTER — Other Ambulatory Visit (HOSPITAL_BASED_OUTPATIENT_CLINIC_OR_DEPARTMENT_OTHER): Payer: Self-pay

## 2022-06-09 ENCOUNTER — Other Ambulatory Visit (HOSPITAL_COMMUNITY): Payer: Self-pay

## 2022-06-13 ENCOUNTER — Ambulatory Visit (INDEPENDENT_AMBULATORY_CARE_PROVIDER_SITE_OTHER): Payer: PPO | Admitting: Family Medicine

## 2022-06-13 ENCOUNTER — Other Ambulatory Visit (HOSPITAL_COMMUNITY)
Admission: RE | Admit: 2022-06-13 | Discharge: 2022-06-13 | Disposition: A | Discharge status: Home / Self Care | Payer: PPO | Source: Ambulatory Visit | Attending: Family Medicine | Admitting: Family Medicine

## 2022-06-13 ENCOUNTER — Encounter: Payer: Self-pay | Admitting: Family Medicine

## 2022-06-13 VITALS — BP 126/74 | HR 63 | Temp 98.4°F | Ht 61.0 in | Wt 139.8 lb

## 2022-06-13 DIAGNOSIS — R300 Dysuria: Secondary | ICD-10-CM

## 2022-06-13 DIAGNOSIS — N898 Other specified noninflammatory disorders of vagina: Secondary | ICD-10-CM | POA: Diagnosis not present

## 2022-06-13 DIAGNOSIS — N819 Female genital prolapse, unspecified: Secondary | ICD-10-CM | POA: Diagnosis not present

## 2022-06-13 DIAGNOSIS — R3915 Urgency of urination: Secondary | ICD-10-CM | POA: Diagnosis not present

## 2022-06-13 LAB — POCT URINALYSIS DIPSTICK
Bilirubin, UA: NEGATIVE
Blood, UA: NEGATIVE
Glucose, UA: NEGATIVE
Ketones, UA: NEGATIVE
Leukocytes, UA: NEGATIVE
Nitrite, UA: NEGATIVE
Protein, UA: NEGATIVE
Spec Grav, UA: 1.015 (ref 1.010–1.025)
Urobilinogen, UA: 0.2 E.U./dL
pH, UA: 6.5 (ref 5.0–8.0)

## 2022-06-13 NOTE — Progress Notes (Signed)
Established Patient Office Visit   Subjective  Patient ID: Valerie Love, female    DOB: [birth year]  Age: 67 y.o. MRN: [id number]  Chief Complaint  Patient presents with   Vaginal Discharge    Pt reports sx of vaginal discharge with a little itch. Denied burning or vaginal order. Sx started last week.     Patient is a 67 year old female followed by Dr. Regis Bill and seen today for ongoing concern.  Patient endorses whitish vaginal discharge, lower abdominal pressure x 3 days, and urinary urgency times years.  Endorses feeling a bulge in the vagina.  Patient has not been sexually active since the death of her husband.  Patient s/p 3 SVDs, with her last child weighing a little over 9 pounds.  Patient denies dysuria, low back pain, nausea, vomiting, urinary frequency, bladder leakage      ROS Negative unless stated above    Objective:     BP 126/74 (BP Location: Right Arm, Patient Position: Sitting, Cuff Size: Normal)   Pulse 63   Temp 98.4 F (36.9 C) (Oral)   Ht 5\' 1"  (1.549 m)   Wt 139 lb 12.8 oz (63.4 kg)   SpO2 96%   BMI 26.41 kg/m    Physical Exam Constitutional:      Appearance: Normal appearance.  HENT:     Head: Normocephalic and atraumatic.     Nose: Nose normal.     Mouth/Throat:     Mouth: Mucous membranes are moist.  Eyes:     Extraocular Movements: Extraocular movements intact.     Conjunctiva/sclera: Conjunctivae normal.     Pupils: Pupils are equal, round, and reactive to light.  Pulmonary:     Effort: Pulmonary effort is normal.  Genitourinary:    General: Normal vulva.     Pubic Area: No rash.      Rectum: Normal.     Comments: Mild thinning of vaginal rouge, no vaginal lesions.  Cervix prominent and proximal to introitus, uterine prolapse.  No d/c noted. Skin:    General: Skin is warm and dry.  Neurological:     Mental Status: She is alert.      Results for orders placed or performed in visit on 06/13/22  POC Urinalysis Dipstick   Result Value Ref Range   Color, UA yellow    Clarity, UA clear    Glucose, UA Negative Negative   Bilirubin, UA negative    Ketones, UA negative    Spec Grav, UA 1.015 1.010 - 1.025   Blood, UA Negative    pH, UA 6.5 5.0 - 8.0   Protein, UA Negative Negative   Urobilinogen, UA 0.2 0.2 or 1.0 E.U./dL   Nitrite, UA negative    Leukocytes, UA Negative Negative   Appearance     Odor        Assessment & Plan:  Female genital prolapse, unspecified type -     Ambulatory referral to Physical Therapy  Vaginal discharge -     Cervicovaginal ancillary only  Dysuria -     POCT urinalysis dipstick  Urinary urgency -     Ambulatory referral to Physical Therapy  UA negative for UTI.  Uterine prolapse causing symptoms.  Discussed pelvic floor physical therapy and strengthening exercises.  Also discussed pessary and surgery.  Patient wishes to work on strengthening exercises and PT.  Referral placed.  Aptima swab collected.  Further recommendations if needed based on results.  Return if symptoms worsen or fail to improve.  Billie Ruddy, MD

## 2022-06-13 NOTE — Patient Instructions (Signed)
Referral for pelvic floor physical therapy was placed.  You should expect a phone call about scheduling this appointment.  If you have not heard anything in the next week or so please contact the clinic so that we can check on it for you.

## 2022-06-14 ENCOUNTER — Telehealth: Payer: Self-pay | Admitting: Internal Medicine

## 2022-06-14 LAB — CERVICOVAGINAL ANCILLARY ONLY
Bacterial Vaginitis (gardnerella): NEGATIVE
Candida Glabrata: NEGATIVE
Candida Vaginitis: NEGATIVE
Comment: NEGATIVE
Comment: NEGATIVE
Comment: NEGATIVE
Comment: NEGATIVE
Trichomonas: NEGATIVE

## 2022-06-14 NOTE — Telephone Encounter (Signed)
Saw Valerie Love 06/13/22. Cleans houses for a living, says her visit note says no heavy lifting. Has a job tomorrow, seeking clarification as to what is considered "heavy". Says vacuum itself weighs 10 pounds

## 2022-06-15 NOTE — Telephone Encounter (Signed)
Initially confused as patient was not given a note for work.  Per review of chart it appears that the after visit summary handout mentions no heavy lifting.  This can include weights that cause strain/increased pressure in lower abdomen/pelvis, typically repetitive lifting of 50 lbs or more throughout the day, or lifting 100 pounds at a time would be considered as heavy lifting.

## 2022-06-16 ENCOUNTER — Ambulatory Visit (INDEPENDENT_AMBULATORY_CARE_PROVIDER_SITE_OTHER): Payer: PPO | Admitting: Orthopedic Surgery

## 2022-06-16 DIAGNOSIS — G8929 Other chronic pain: Secondary | ICD-10-CM

## 2022-06-16 DIAGNOSIS — M25511 Pain in right shoulder: Secondary | ICD-10-CM | POA: Diagnosis not present

## 2022-06-16 NOTE — Telephone Encounter (Signed)
Patient informed of the message below and verbalized understanding.  

## 2022-06-20 ENCOUNTER — Encounter: Payer: Self-pay | Admitting: Orthopedic Surgery

## 2022-06-20 DIAGNOSIS — M25511 Pain in right shoulder: Secondary | ICD-10-CM

## 2022-06-20 DIAGNOSIS — N819 Female genital prolapse, unspecified: Secondary | ICD-10-CM | POA: Diagnosis not present

## 2022-06-20 DIAGNOSIS — G8929 Other chronic pain: Secondary | ICD-10-CM | POA: Diagnosis not present

## 2022-06-20 MED ORDER — METHYLPREDNISOLONE ACETATE 40 MG/ML IJ SUSP
40.0000 mg | INTRAMUSCULAR | Status: AC | PRN
  Administered 2022-06-20: 40 mg via INTRA_ARTICULAR

## 2022-06-20 MED ORDER — LIDOCAINE HCL 1 % IJ SOLN
5.0000 mL | INTRAMUSCULAR | Status: AC | PRN
  Administered 2022-06-20: 5 mL

## 2022-06-20 NOTE — Progress Notes (Signed)
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  Assessment & Plan: Visit Diagnoses:  1. Chronic right shoulder pain     Plan: The shoulder was injected the subacromial space patient was given instructions for rotator cuff strengthening exercises.  Recommend avoiding overhead work.  Follow-Up Instructions: Return if symptoms worsen or fail to improve.   Ortho Exam  Patient is alert, oriented, no adenopathy, well-dressed, normal affect, normal respiratory effort. Examination patient has full range of motion of the right shoulder she has pain to palpation of the biceps tendon pain with Neer and Hawkins impingement test.  Imaging: No results found. No images are attached to the encounter.  Labs: Lab Results  Component Value Date   HGBA1C 5.6 06/22/2021   HGBA1C 5.5 08/28/2020   HGBA1C 5.6 06/10/2019   ESRSEDRATE 10 06/22/2021   ESRSEDRATE 17 04/18/2016   ESRSEDRATE 9 08/18/2010   CRP <1.0 06/22/2021   CRP 0.8 04/18/2016   GRAMSTAIN No WBC Seen 04/18/2016   GRAMSTAIN No Squamous Epithelial Cells Seen 04/18/2016   GRAMSTAIN No Organisms Seen 04/18/2016   LABORGA Few GROUP B STREP (S.AGALACTIAE) ISOLATED 04/18/2016     Lab Results  Component Value Date   ALBUMIN 4.2 06/22/2021   ALBUMIN 3.9 08/28/2020   ALBUMIN 4.2 08/08/2019    Lab Results  Component Value Date   MG 1.9 11/09/2021   MG 2.3 11/12/2018   No results  found for: "VD25OH"  No results found for: "PREALBUMIN"    Latest Ref Rng & Units 11/09/2021    7:42 PM 06/22/2021   11:47 AM 08/28/2020    8:20 AM  CBC EXTENDED  WBC 4.0 - 10.5 K/uL 6.6  5.4  4.8   RBC 3.87 - 5.11 MIL/uL 4.12  4.54  4.44   Hemoglobin 12.0 - 15.0 g/dL 13.1  13.5  13.4   HCT 36.0 - 46.0 % 38.6  41.2  40.1   Platelets 150 - 400 K/uL 225  261.0  236.0   NEUT# 1.4 - 7.7 K/uL  3.1  2.7   Lymph# 0.7 - 4.0 K/uL  1.6  1.6      There is no height or weight on file to calculate BMI.  Orders:  No orders of the defined types were placed in this encounter.  No orders of the defined types were placed in this encounter.    Procedures: Large Joint Inj: R subacromial bursa on 06/20/2022 8:10 AM Indications: diagnostic evaluation and pain Details: 22 G 1.5 in needle, posterior approach  Arthrogram: No  Medications: 5 mL lidocaine 1 %; 40 mg methylPREDNISolone acetate 40 MG/ML Outcome: tolerated well, no immediate complications Procedure, treatment alternatives, risks and benefits explained, specific risks discussed. Consent was given by the patient. Immediately prior to  procedure a time out was called to verify the correct patient, procedure, equipment, support staff and site/side marked as required. Patient was prepped and draped in the usual sterile fashion.      Clinical Data: No additional findings.  ROS:  All other systems negative, except as noted in the HPI. Review of Systems  Objective: Vital Signs: There were no vitals taken for this visit.  Specialty Comments:  EXAM: MRI CERVICAL SPINE WITHOUT CONTRAST   TECHNIQUE: Multiplanar, multisequence MR imaging of the cervical spine was performed. No intravenous contrast was administered.   COMPARISON:  Radiographs of the cervical spine 03/16/2020 (images available, report unavailable).   FINDINGS: Mild intermittent motion degradation.   Alignment: Reversal of the expected cervical lordosis. 2 mm  C2-C3 grade 1 anterolisthesis. 2-3 mm C3-C4 grade 1 anterolisthesis.   Vertebrae: Vertebral body height is maintained. Mild degenerative endplate edema at [id number], C5-C6, C6-C7 and C7-T1. Marrow edema within the bilateral posterior elements at C2-C3 and on the left at C3-C4, likely degenerative and related to facet arthrosis.   Cord: No signal abnormality identified within the cervical spinal cord   Posterior Fossa, vertebral arteries, paraspinal tissues: No abnormality identified within included portions of the posterior fossa. Flow voids preserved within the imaged cervical vertebral arteries. Paraspinal soft tissues unremarkable.   Disc levels:   Multilevel disc space narrowing, greatest at C4-C5 (moderate), C5-C6 (moderate/advanced), C6-C7 (moderate/advanced) and C7-T1 (moderate/advanced).   C2-C3: Grade 1 anterolisthesis. Slight disc uncovering. Facet arthrosis on the right. No significant spinal canal or foraminal stenosis.   C3-C4: Grade 1 anterolisthesis. Slight disc uncovering and disc bulge. Uncovertebral hypertrophy on the left. Facet arthrosis (greater on the left and fairly advanced on the left). No significant spinal canal stenosis. Severe left neural foraminal narrowing.   C4-C5: Disc bulge. Endplate spurring with bilateral disc osteophyte ridge/uncinate hypertrophy. A disc bulge effaces the ventral thecal sac, contacting and minimally flattening the ventral spinal cord. However, the dorsal CSF space is maintained within the spinal canal. Bilateral neural foraminal narrowing (mild right, severe left).   C5-C6: Disc bilateral disc osteophyte ridge/uncinate hypertrophy. Mild-to-moderate spinal canal stenosis. The disc bulge contacts and minimally flattens the ventral aspect of the spinal cord. Bilateral neural foraminal narrowing (severe right, mild left).   C6-C7: Disc bulge. Bilateral disc osteophyte ridge/uncinate hypertrophy. Mild spinal canal stenosis  (without significant spinal cord mass effect). Severe bilateral neural foraminal narrowing.   C7-T1: Disc bulge. Right-sided disc osteophyte ridge. Facet arthrosis. No significant spinal canal stenosis. Mild right neural foraminal narrowing.   IMPRESSION: Cervical spondylosis, as outlined and with findings most notably as follows.   At C5-C6, there is moderate/advanced disc degeneration. Disc bulge. Bilateral disc osteophyte ridge/uncinate hypertrophy. Mild to moderate spinal canal narrowing. The disc bulge contacts and minimally flattens the ventral aspect of the spinal cord. Bilateral neural foraminal narrowing (severe right, mild left).   At C4-C5, there is moderate disc degeneration. Disc bulge with bilateral disc osteophyte ridge/uncinate hypertrophy. The disc bulge contacts and minimally flattens the ventral spinal cord. However, the dorsal CSF space is maintained within the spinal canal. Bilateral neural foraminal narrowing (mild right, severe left).   No more than mild spinal canal narrowing at the remaining levels. Additional sites of foraminal stenosis, as detailed and greatest on the left at C3-C4 (severe) and bilaterally at C6-C7 (severe).   Also of note, disc degeneration is moderate/advanced at C6-C7 and C7-T1.   Degenerative marrow edema as described.   Nonspecific reversal of expected cervical lordosis. Grade  1 anterolisthesis at C2-C3 and C3-C4.     Electronically Signed   By: Kellie Simmering D.O.   On: 07/15/2021 07:41  PMFS History: Patient Active Problem List   Diagnosis Date Noted   Secondary hypercoagulable state ([place]) 11/16/2021   Atrial fibrillation with RVR ([place]) 11/10/2021   Atrial fibrillation with rapid ventricular response ([place]) [id number]   Acute systolic heart failure ([place]) 11/01/2018   NSTEMI (non-ST elevated myocardial infarction) ([place]) 10/30/2018   Hyperlipidemia 06/13/2018   Lactose intolerance 06/13/2018   S/P total knee arthroplasty,  right 03/16/2016   Unilateral primary osteoarthritis, right knee    Special screening for malignant neoplasms, colon 12/29/2014   Diarrhea 12/29/2014   Medication management 05/13/2013   Does not have health insurance 05/13/2013   Adjustment reaction with anxiety and depression 03/12/2012   Medication monitoring encounter 03/12/2012   Hemorrhoids 08/13/2011   Perineal itching, female 08/13/2011   LBBB (left bundle branch block) 09/24/2010   Murmur 09/08/2010   EKG, abnormal 08/19/2010   Visit for preventive health examination 08/19/2010   Palpitations 08/18/2010   Dizziness 08/18/2010   Arthritis 08/18/2010   Anxiety state 06/18/2008   GRIEF REACTION 06/18/2008   DEPRESSION, MAJOR, RECURRENT 07/06/2006   ARTHRALGIA, UNSPECIFIED 07/06/2006   Past Medical History:  Diagnosis Date   Anxiety    Arthritis    R knee, back, hands    Cellulitis of knee 04/2016   RT KNEE   Cellulitis of right knee 04/18/2016   Childhood asthma    Complication of anesthesia    woke up slowly - 78yr. ago   Depression    related to husband illness and death   History of jaundice    as a child     Hx of varicella    Hyperthyroidism    during pregnancy, treated & resolved post partum    IBS (irritable bowel syndrome)    LBBB (left bundle branch block)    Scoliosis    Scoliosis 07/2015   Thyroid disease in pregnancy    TOBACCO DEPENDENCE 07/06/2006   Qualifier: Diagnosis of  By: OSamara Snide    Troponin level elevated     Family History  Problem Relation Age of Onset   Alcohol abuse Mother        died from stomach ulcers   Stroke Father        died  from cva and mi   Hypertension Father    Hyperlipidemia Father    Coronary artery disease Father        Age 67  Colon polyps Sister    Hepatitis C Brother    Colon cancer Neg Hx     Past Surgical History:  Procedure Laterality Date   BREAST BIOPSY Right 1979   benign    Cyst removed from ovary     KNEE SURGERY     right Devynn Scheff    LEFT  HEART CATH AND CORONARY ANGIOGRAPHY N/A 10/31/2018   Procedure: LEFT HEART CATH AND CORONARY ANGIOGRAPHY;  Surgeon: KTroy Sine MD;  [facility];  Service: Cardiovascular;  Laterality: N/A;   TONSILLECTOMY AND ADENOIDECTOMY  1073   TOTAL KNEE ARTHROPLASTY 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   Procedure: TOTAL KNEE ARTHROPLASTY;  Surgeon: MNewt Minion MD;  [facility]  Service: Orthopedics;  Laterality: Right;   TUBAL LIGATION     WISDOM TOOTH EXTRACTION     Social History   Occupational History   Occupation: self    Comment: Catering  and cleaning  Tobacco Use   Smoking status: Former    Packs/day: 0.25    Years: 40.00    Total pack years: 10.00    Types: Cigarettes    Quit date: 01/06/2016    Years since quitting: 6.4   Smokeless tobacco: Never   Tobacco comments:    Former smoker 11/16/21  Substance and Sexual Activity   Alcohol use: Yes    Alcohol/week: 7.0 standard drinks of alcohol    Types: 7 Glasses of wine per week    Comment: 1 glass of wine daily 11/16/21   Drug use: No   Sexual activity: Not on file

## 2022-06-21 ENCOUNTER — Other Ambulatory Visit: Payer: Self-pay

## 2022-06-21 ENCOUNTER — Ambulatory Visit: Payer: PPO | Attending: Family Medicine

## 2022-06-21 DIAGNOSIS — M62838 Other muscle spasm: Secondary | ICD-10-CM | POA: Insufficient documentation

## 2022-06-21 DIAGNOSIS — M6281 Muscle weakness (generalized): Secondary | ICD-10-CM | POA: Diagnosis not present

## 2022-06-21 DIAGNOSIS — R279 Unspecified lack of coordination: Secondary | ICD-10-CM | POA: Insufficient documentation

## 2022-06-21 DIAGNOSIS — R3915 Urgency of urination: Secondary | ICD-10-CM | POA: Diagnosis not present

## 2022-06-21 DIAGNOSIS — R293 Abnormal posture: Secondary | ICD-10-CM | POA: Insufficient documentation

## 2022-06-21 DIAGNOSIS — N819 Female genital prolapse, unspecified: Secondary | ICD-10-CM | POA: Diagnosis not present

## 2022-06-21 NOTE — Therapy (Signed)
OUTPATIENT PHYSICAL THERAPY FEMALE PELVIC EVALUATION   Patient Name: Valerie Love MRN: [id number] DOB:11/27/1955, 67 y.o., female Today's Date: 06/21/2022  END OF SESSION:  PT End of Session - 06/21/22 1101     Visit Number 1    Date for PT Re-Evaluation 08/30/22    Authorization Type HTA    PT Start Time 1100    PT Stop Time 1140    PT Time Calculation (min) 40 min    Activity Tolerance Patient tolerated treatment well    Behavior During Therapy WFL for tasks assessed/performed             Past Medical History:  Diagnosis Date   Anxiety    Arthritis    R knee, back, hands    Cellulitis of knee 04/2016   RT KNEE   Cellulitis of right knee 04/18/2016   Childhood asthma    Complication of anesthesia    woke up slowly - 41yr. ago   Depression    related to husband illness and death   History of jaundice    as a child     Hx of varicella    Hyperthyroidism    during pregnancy, treated & resolved post partum    IBS (irritable bowel syndrome)    LBBB (left bundle branch block)    Scoliosis    Scoliosis 07/2015   Thyroid disease in pregnancy    TOBACCO DEPENDENCE 07/06/2006   Qualifier: Diagnosis of  By: OSamara Snide    Troponin level elevated    Past Surgical History:  Procedure Laterality Date   BREAST BIOPSY Right 1979   benign    Cyst removed from ovary     KNEE SURGERY     right duda    LEFT HEART CATH AND CORONARY ANGIOGRAPHY N/A 10/31/2018   Procedure: LEFT HEART CATH AND CORONARY ANGIOGRAPHY;  Surgeon: KTroy Sine MD;  [facility];  Service: Cardiovascular;  Laterality: N/A;   TONSILLECTOMY AND ADENOIDECTOMY  1073   TOTAL KNEE ARTHROPLASTY 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   Procedure: TOTAL KNEE ARTHROPLASTY;  Surgeon: MNewt Minion MD;  [facility]  Service: Orthopedics;  Laterality: Right;   TUBAL LIGATION     WISDOM TOOTH EXTRACTION     Patient Active Problem List   Diagnosis Date Noted   Secondary [place]coagulable state ([facility]  11/16/2021   Atrial fibrillation with RVR ([facility] 11/10/2021   Atrial fibrillation with rapid ventricular response ([facility] [id number]  Acute systolic heart failure ([facility] 11/01/2018   NSTEMI (non-ST elevated myocardial infarction) ([facility] 10/30/2018   Hyperlipidemia 06/13/2018   Lactose intolerance 06/13/2018   S/P total knee arthroplasty, right 03/16/2016   Unilateral primary osteoarthritis, right knee    Special screening for malignant neoplasms, colon 12/29/2014   Diarrhea 12/29/2014   Medication management 05/13/2013   Does not have health insurance 05/13/2013   Adjustment reaction with anxiety and depression 03/12/2012   Medication monitoring encounter 03/12/2012   Hemorrhoids 08/13/2011   Perineal itching, female 08/13/2011   LBBB (left bundle branch block) 09/24/2010   Murmur 09/08/2010   EKG, abnormal 08/19/2010   Visit for preventive health examination 08/19/2010   Palpitations 08/18/2010   Dizziness 08/18/2010   Arthritis 08/18/2010   Anxiety state 06/18/2008   GRIEF REACTION 06/18/2008   DEPRESSION, MAJOR, RECURRENT 07/06/2006   ARTHRALGIA, UNSPECIFIED 07/06/2006    PCP: PBurnis Medin MD  REFERRING PROVIDER: BBillie Ruddy MD  REFERRING DIAG: N81.9 (ICD-10-CM) - Female genital prolapse, unspecified  type R39.15 (ICD-10-CM) - Urinary urgency  THERAPY DIAG:  Female genital prolapse, unspecified type  Urinary urgency  Abnormal posture  Muscle weakness (generalized)  Unspecified lack of coordination  Other muscle spasm  Rationale for Evaluation and Treatment: Rehabilitation  ONSET DATE: 2.5-3 weeks ago  SUBJECTIVE:                                                                                                                                                                                           06/21/22 SUBJECTIVE STATEMENT: Pt states that she was showering and felt bulge in the vagina. She was diagnosed with a uterine prolapse. She states that she has  had terrible low back pain, and pain has changed to very low back pain in the last couple of months. She is not currently Fluid intake: Yes: 32 oz of water a day; 1 regular/1 decaf cup of coffee; 1-2 glasses of wine in the evening    PAIN:  Are you having pain? Yes NPRS scale: 4/10 Pain location:  low back pain  Pain type: aching and dull Pain description: intermittent   Aggravating factors: prolonged standing Relieving factors: sitting  PRECAUTIONS: None  WEIGHT BEARING RESTRICTIONS: No  FALLS:  Has patient fallen in last 6 months? 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  PLOF: Independent  PATIENT GOALS: decrease urinary symptoms and keep prolapse from getting worse  PERTINENT HISTORY:  Cyst removed from ovary, anxiety, hypothyroidism, IBS, scoliosis, a-fib (on blood thinners until April 4th when she has ablation) Sexual abuse: No  BOWEL MOVEMENT: Pain with bowel movement: No Type of bowel movement:Frequency 1x/day and Strain No Fully empty rectum: Yes: - Leakage: No - was before she knew she was lactose intolerant - occasional leakage - several weeks ago Pads: No Fiber supplement: No  URINATION: Pain with urination: Yes urgency with urination Fully empty bladder: Yes: sometimes - has to wait a couple of minutes; occasional leakage after she stands Stream:  sometimes urine gushes out and sometimes it dribbles  Urgency: Yes: seems to be getting worse - could not get to the bathroom the other night Frequency: 15-20x during the day, wakes 2-3x/night  Leakage: Urge to void and Walking to the bathroom Pads: No  INTERCOURSE: Not sexually active, no history of pain; able to have orgasm on her own, does not hurt, not often  PREGNANCY: Vaginal deliveries 3 Tearing Yes: some   PROLAPSE: Urethrocele dx this week   OBJECTIVE:   06/21/22: COGNITION: Overall cognitive status: Within functional limits for tasks assessed  SENSATION: Light touch: Appears intact Proprioception: Appears intact  MUSCLE LENGTH:   FUNCTIONAL TESTS:  sit-up test: unable   GAIT: Comments: decreased bil hip extension  POSTURE:  scoliosis  PELVIC ALIGNMENT:  LUMBARAROM/PROM:   LOWER EXTREMITY ROM:   LOWER EXTREMITY MMT:   PALPATION:   General  tenderness in bil lower quadrant                External Perineal Exam evidence of GSM                             Internal Pelvic Floor tenderness throughout first layer of pelvic floor, especially around urethra; cervix palpated, little change in position with bearing down  Patient confirms identification and approves PT to assess internal pelvic floor and treatment Yes  PELVIC MMT:   MMT eval  Vaginal 1/5, endurance 3 seconds  Internal Anal Sphincter   External Anal Sphincter   Puborectalis   Diastasis Recti None palpated, no distortion - but had significant difficulty holding pressure due to weakness  (Blank rows = not tested)        TONE: Increased in superficial pelvic floor   PROLAPSE: Grade 2 uterine ligament laxity, grade 1 anterior vaginal wall laxity  TODAY'S TREATMENT:                                                                                                                              DATE: 06/21/22  EVAL  Therapeutic activities: Bowel/bladder journal Deep core anatomy Pressure management     PATIENT EDUCATION:  Education details: see above Person educated: Patient Education method: Explanation, Demonstration, Tactile cues, Verbal cues, and Handouts Education comprehension: verbalized understanding  HOME EXERCISE PROGRAM: Bladder journals   ASSESSMENT:  CLINICAL IMPRESSION: Patient is a 67 y.o. female who was seen today for physical therapy evaluation and treatment for prolapse and associated symptoms. Exam findings notable for  abnormal posture, uterine ligament laxity, anterior vaignal wall laxity, pelvic floor weakness and poor coordiantion of contraction, decreased pelvic floor endurance, some increase in superficial pelvic floor muscle tension, tension and tenderness in bil lower abdominal quadrants, and core weakness. Signs and symptoms are most consistent with poor abdominal pressure management with functional activities and occupation, pelvic floor weakness and poor coordination, and ligament/vaginal wall laxity; as patient's symptoms are not severe and grade of laxity is not sever, expect excellent outcomes. We discussed filling out several days of bowel/bladder journal to gain better understanding over current urinary habits/patterns; initial patient education performed on pressure management and deep core anatomy. She will continue to benefit from skilled PT intervention in order to decrease urinary urgency/incontinence, improve abdominal pressure management, and improve QOL.   OBJECTIVE IMPAIRMENTS: decreased activity tolerance, decreased coordination, decreased endurance, decreased strength, increased fascial restrictions, increased muscle spasms, impaired tone, postural dysfunction, and pain.   ACTIVITY LIMITATIONS: carrying, lifting, bending, standing, squatting, transfers, and continence  PARTICIPATION LIMITATIONS:  community activity and occupation  PERSONAL FACTORS: 3+ comorbidities: G3P3, Cyst removed from ovary, anxiety, hypothyroidism, IBS, scoliosis, a-fib (on blood thinners until April 4th when she has ablation)  are also affecting patient's functional outcome.   REHAB POTENTIAL: Good  CLINICAL DECISION MAKING: Stable/uncomplicated  EVALUATION COMPLEXITY: Low   GOALS: Goals reviewed with patient? Yes  SHORT TERM GOALS: Target date: 07/26/22  Pt will be independent with HEP.   Baseline: Goal status: INITIAL  2.  Pt will be independent with the knack, urge suppression technique, and double  voiding in order to improve bladder habits and decrease urinary incontinence.   Baseline:  Goal status: INITIAL  3.  Pt will be able to correctly perform diaphragmatic breathing and appropriate pressure management in order to prevent worsening vaginal wall laxity and improve pelvic floor A/ROM.   Baseline:  Goal status: INITIAL    LONG TERM GOALS: Target date: 08/30/22  Pt will be independent with advanced HEP.   Baseline:  Goal status: INITIAL  2.  Pt will demonstrate normal pelvic floor muscle tone and A/ROM, able to achieve 4/5 strength with contractions and 10 sec endurance, in order to provide appropriate lumbopelvic support in functional activities.   Baseline:  Goal status: INITIAL  3.  Pt will be able to go 2-3 hours in between voids without urgency or incontinence in order to improve QOL and perform all functional activities with less difficulty.   Baseline:  Goal status: INITIAL  4.  Pt will be able to lift 25lbs with appropriate pressure management and body mechanics in order to perform work functions without worsening symptoms .  Baseline:  Goal status: INITIAL  5.  Pt will decrease frequency of nightly trips to the bathroom to 1 or less in order to get restful sleep.   Baseline:  Goal status: INITIAL  6.  Pt will report no episodes of urinary or fecal incontinence in order to improve confidence in community activities and personal hygiene.   Baseline:  Goal status: INITIAL  PLAN:  PT FREQUENCY: 1-2x/week  PT DURATION: 10 weeks  PLANNED INTERVENTIONS: Therapeutic exercises, Therapeutic activity, Neuromuscular re-education, Balance training, Gait training, Patient/Family education, Self Care, Joint mobilization, Dry Needling, Biofeedback, and Manual therapy  PLAN FOR NEXT SESSION: Begin core training; continue pressure management; go over bladder journal; urge suppression technique/bladder retraining.    Heather Roberts, PT, DPT02/13/241:42 PM

## 2022-07-05 DIAGNOSIS — N8182 Incompetence or weakening of pubocervical tissue: Secondary | ICD-10-CM | POA: Diagnosis not present

## 2022-07-14 ENCOUNTER — Ambulatory Visit: Payer: PPO | Attending: Family Medicine

## 2022-07-14 DIAGNOSIS — R279 Unspecified lack of coordination: Secondary | ICD-10-CM | POA: Diagnosis not present

## 2022-07-14 DIAGNOSIS — M6281 Muscle weakness (generalized): Secondary | ICD-10-CM | POA: Diagnosis not present

## 2022-07-14 DIAGNOSIS — R293 Abnormal posture: Secondary | ICD-10-CM

## 2022-07-14 DIAGNOSIS — M62838 Other muscle spasm: Secondary | ICD-10-CM | POA: Diagnosis not present

## 2022-07-14 DIAGNOSIS — R3915 Urgency of urination: Secondary | ICD-10-CM | POA: Diagnosis not present

## 2022-07-14 NOTE — Therapy (Addendum)
OUTPATIENT PHYSICAL THERAPY TREATMENT NOTE   Patient Name: Valerie Love MRN: [id number] DOB:[birth year], 67 y.o., female Today's Date: 07/14/2022  PCP: Burnis Medin, MD REFERRING PROVIDER: Billie Ruddy, MD  END OF SESSION:   PT End of Session - 07/14/22 0800     Visit Number 2    Date for PT Re-Evaluation 08/30/22    Authorization Type HTA    PT Start Time 0800    PT Stop Time 0840    PT Time Calculation (min) 40 min    Activity Tolerance Patient tolerated treatment well    Behavior During Therapy [facility] for tasks assessed/performed             Past Medical History:  Diagnosis Date   Anxiety    Arthritis    R knee, back, hands    Cellulitis of knee 04/2016   RT KNEE   Cellulitis of right knee 04/18/2016   Childhood asthma    Complication of anesthesia    woke up slowly - 45yr. ago   Depression    related to husband illness and death   History of jaundice    as a child     Hx of varicella    Hyperthyroidism    during pregnancy, treated & resolved post partum    IBS (irritable bowel syndrome)    LBBB (left bundle branch block)    Scoliosis    Scoliosis 07/2015   Thyroid disease in pregnancy    TOBACCO DEPENDENCE 07/06/2006   Qualifier: Diagnosis of  By: OSamara Snide    Troponin level elevated    Past Surgical History:  Procedure Laterality Date   BREAST BIOPSY Right 1979   benign    Cyst removed from ovary     KNEE SURGERY     right duda    LEFT HEART CATH AND CORONARY ANGIOGRAPHY N/A 10/31/2018   Procedure: LEFT HEART CATH AND CORONARY ANGIOGRAPHY;  Surgeon: KTroy Sine MD;  [facility];  Service: Cardiovascular;  Laterality: N/A;   TONSILLECTOMY AND ADENOIDECTOMY  1073   TOTAL KNEE ARTHROPLASTY 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   Procedure: TOTAL KNEE ARTHROPLASTY;  Surgeon: MNewt Minion MD;  [facility]  Service: Orthopedics;  Laterality: Right;   TUBAL LIGATION     WISDOM TOOTH EXTRACTION     Patient Active Problem List    Diagnosis Date Noted   Secondary hypercoagulable state ([facility] 11/16/2021   Atrial fibrillation with RVR ([facility] 11/10/2021   Atrial fibrillation with rapid ventricular response ([facility] [id number]  Acute systolic heart failure ([facility] 11/01/2018   NSTEMI (non-ST elevated myocardial infarction) ([facility] 10/30/2018   Hyperlipidemia 06/13/2018   Lactose intolerance 06/13/2018   S/P total knee arthroplasty, right 03/16/2016   Unilateral primary osteoarthritis, right knee    Special screening for malignant neoplasms, colon 12/29/2014   Diarrhea 12/29/2014   Medication management 05/13/2013   Does not have health insurance 05/13/2013   Adjustment reaction with anxiety and depression 03/12/2012   Medication monitoring encounter 03/12/2012   Hemorrhoids 08/13/2011   Perineal itching, female 08/13/2011   LBBB (left bundle branch block) 09/24/2010   Murmur 09/08/2010   EKG, abnormal 08/19/2010   Visit for preventive health examination 08/19/2010   Palpitations 08/18/2010   Dizziness 08/18/2010   Arthritis 08/18/2010   Anxiety state 06/18/2008   GRIEF REACTION 06/18/2008   DEPRESSION, MAJOR, RECURRENT 07/06/2006   ARTHRALGIA, UNSPECIFIED 07/06/2006    REFERRING DIAG: N81.9 (ICD-10-CM) - Female genital prolapse, unspecified type  R39.15 (ICD-10-CM) - Urinary urgency  THERAPY DIAG:  Abnormal posture  Urinary urgency  Muscle weakness (generalized)  Unspecified lack of coordination  Other muscle spasm  Rationale for Evaluation and Treatment Rehabilitation  PERTINENT HISTORY: Cyst removed from ovary, anxiety, hypothyroidism, IBS, scoliosis, a-fib (on blood thinners until April 4th when she has ablation)  PRECAUTIONS: heart arhythmia   SUBJECTIVE:                                                                                                                                                                                      SUBJECTIVE STATEMENT:  Pt states that she may have overestimated  how often she is going to the bathroom, but she does still have a lot of urgency. She has had pessary placed and feels like this is helpful. Patient states that she was uncomfortable with internal pelvic exam last treatment session.    PAIN:  Are you having pain? No 06/21/22 SUBJECTIVE STATEMENT: Pt states that she was showering and felt bulge in the vagina. She was diagnosed with a uterine prolapse. She states that she has had terrible low back pain, and pain has changed to very low back pain in the last couple of months. She is not currently Fluid intake: Yes: 32 oz of water a day; 1 regular/1 decaf cup of coffee; 1-2 glasses of wine in the evening     PAIN:  Are you having pain? Yes NPRS scale: 4/10 Pain location:  low back pain   Pain type: aching and dull Pain description: intermittent    Aggravating factors: prolonged standing Relieving factors: sitting   PRECAUTIONS: None   WEIGHT BEARING RESTRICTIONS: No   FALLS:  Has patient fallen in last 6 months? 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   PLOF: Independent   PATIENT GOALS: decrease urinary symptoms and keep prolapse from getting worse   PERTINENT HISTORY:  Cyst removed from ovary, anxiety, hypothyroidism, IBS, scoliosis, a-fib (on blood thinners until April 4th when she has ablation) Sexual abuse: No   BOWEL MOVEMENT: Pain with bowel movement: No Type of bowel movement:Frequency 1x/day and Strain No Fully empty rectum: Yes: - Leakage: No - was before she knew she was lactose intolerant - occasional leakage - several weeks ago Pads: No Fiber supplement: No   URINATION: Pain with urination: Yes urgency with urination Fully empty bladder: Yes: sometimes - has to wait a couple of minutes; occasional leakage after she stands Stream:  sometimes urine gushes out and sometimes it dribbles   Urgency: Yes: seems to  be getting worse - could not get to the bathroom the other night Frequency: 15-20x during the day, wakes 2-3x/night  Leakage: Urge to void and Walking to the bathroom Pads: No   INTERCOURSE: Not sexually active, no history of pain; able to have orgasm on her own, does not hurt, not often   PREGNANCY: Vaginal deliveries 3 Tearing Yes: some     PROLAPSE: Urethrocele dx this week     OBJECTIVE:  06/21/22: COGNITION: Overall cognitive status: Within functional limits for tasks assessed                          SENSATION: Light touch: Appears intact Proprioception: Appears intact   MUSCLE LENGTH:     FUNCTIONAL TESTS:  sit-up test: unable     GAIT: Comments: decreased bil hip extension   POSTURE:  scoliosis   PELVIC ALIGNMENT:   LUMBARAROM/PROM:     LOWER EXTREMITY ROM:     LOWER EXTREMITY MMT:     PALPATION:   General  tenderness in bil lower quadrant                 External Perineal Exam evidence of GSM                             Internal Pelvic Floor tenderness throughout first layer of pelvic floor, especially around urethra; cervix palpated, little change in position with bearing down   Patient confirms identification and approves PT to assess internal pelvic floor and treatment Yes   PELVIC MMT:   MMT eval  Vaginal 1/5, endurance 3 seconds  Internal Anal Sphincter    External Anal Sphincter    Puborectalis    Diastasis Recti None palpated, no distortion - but had significant difficulty holding pressure due to weakness  (Blank rows = not tested)         TONE: Increased in superficial pelvic floor    PROLAPSE: Grade 2 uterine ligament laxity, grade 1 anterior vaginal wall laxity   TODAY'S TREATMENT:  07/14/22 Neuromuscular re-education: Pelvic floor contraction training (external over clothing) Quick flicks Long holds Transversus abdominus training with multimodal cues for improved motor control and breath  coordination Supine UE ball press 10x Bridge with hip adduction 10x Sidelying ball press 10x bil Seated hip abduction 10x blue band press Seated hip adduction 10x ball squeeze Therapeutic activities: Urge drill                                                                                                                              06/21/22  EVAL  Therapeutic activities: Bowel/bladder journal Deep core anatomy Pressure management        PATIENT EDUCATION:  Education details: see above Person educated: Patient Education method: Explanation, Demonstration, Tactile cues, Verbal cues, and Handouts Education comprehension: verbalized understanding   HOME EXERCISE PROGRAM: [id number]   ASSESSMENT:  CLINICAL IMPRESSION: Due to initial discomfort with internal pelvic exam, external feedback given for pelvic floor strengthening program today and urge drill. She demonstrated good improvements in coordination of these muscles and was able to perform with breath today. Core training performed in addition to progressions with ball isometrics. She did well with hip strengthening while incorporating core/pelvic floor. Discussed pressure management with each exercise. She will continue to benefit from skilled PT intervention in order to decrease urinary urgency/incontinence, improve abdominal pressure management, and improve QOL.    OBJECTIVE IMPAIRMENTS: decreased activity tolerance, decreased coordination, decreased endurance, decreased strength, increased fascial restrictions, increased muscle spasms, impaired tone, postural dysfunction, and pain.    ACTIVITY LIMITATIONS: carrying, lifting, bending, standing, squatting, transfers, and continence   PARTICIPATION LIMITATIONS: community activity and occupation   PERSONAL FACTORS: 3+ comorbidities: G3P3, Cyst removed from ovary, anxiety, hypothyroidism, IBS, scoliosis, a-fib (on blood thinners until April 4th when she has ablation)  are also  affecting patient's functional outcome.    REHAB POTENTIAL: Good   CLINICAL DECISION MAKING: Stable/uncomplicated   EVALUATION COMPLEXITY: Low     GOALS: Goals reviewed with patient? Yes   SHORT TERM GOALS: Target date: 07/26/22   Pt will be independent with HEP.    Baseline: Goal status: INITIAL   2.  Pt will be independent with the knack, urge suppression technique, and double voiding in order to improve bladder habits and decrease urinary incontinence.    Baseline:  Goal status: INITIAL   3.  Pt will be able to correctly perform diaphragmatic breathing and appropriate pressure management in order to prevent worsening vaginal wall laxity and improve pelvic floor A/ROM.    Baseline:  Goal status: INITIAL       LONG TERM GOALS: Target date: 08/30/22   Pt will be independent with advanced HEP.    Baseline:  Goal status: INITIAL   2.  Pt will demonstrate normal pelvic floor muscle tone and A/ROM, able to achieve 4/5 strength with contractions and 10 sec endurance, in order to provide appropriate lumbopelvic support in functional activities.    Baseline:  Goal status: INITIAL   3.  Pt will be able to go 2-3 hours in between voids without urgency or incontinence in order to improve QOL and perform all functional activities with less difficulty.    Baseline:  Goal status: INITIAL   4.  Pt will be able to lift 25lbs with appropriate pressure management and body mechanics in order to perform work functions without worsening symptoms .  Baseline:  Goal status: INITIAL   5.  Pt will decrease frequency of nightly trips to the bathroom to 1 or less in order to get restful sleep.    Baseline:  Goal status: INITIAL   6.  Pt will report no episodes of urinary or fecal incontinence in order to improve confidence in community activities and personal hygiene.    Baseline:  Goal status: INITIAL   PLAN:   PT FREQUENCY: 1-2x/week   PT DURATION: 10 weeks   PLANNED  INTERVENTIONS: Therapeutic exercises, Therapeutic activity, Neuromuscular re-education, Balance training, Gait training, Patient/Family education, Self Care, Joint mobilization, Dry Needling, Biofeedback, and Manual therapy   PLAN FOR NEXT SESSION: core training; continue pressure management; go over bladder journal; voiding schedule   Julio Alm, PT, DPT03/07/248:45 AM  PHYSICAL THERAPY DISCHARGE SUMMARY  Visits from Start of Care: 2  Current functional level related to goals / functional outcomes: Not complete   Remaining deficits: See above   Education /  Equipment: HEP   Patient agrees to discharge. Patient goals were not met. Patient is being discharged due to the patient's request.  Julio Alm, PT, DPT04/22/2410:36 AM

## 2022-07-14 NOTE — Patient Instructions (Signed)
Urge Incontinence  Ideal urination frequency is every 2-4 wakeful hours, which equates to 5-8 times within a 24-hour period.   Urge incontinence is leakage that occurs when the bladder muscle contracts, creating a sudden need to go before getting to the bathroom.   Going too often when your bladder isn't actually full can disrupt the body's automatic signals to store and hold urine longer, which will increase urgency/frequency.  In this case, the bladder "is running the show" and strategies can be learned to retrain this pattern.   One should be able to control the first urge to urinate, at around 14m.  The bladder can hold up to a "grande latte," or 4056m To help you gain control, practice the Urge Drill below when urgency strikes.  This drill will help retrain your bladder signals and allow you to store and hold urine longer.  The overall goal is to stretch out your time between voids to reach a more manageable voiding schedule.    Practice your "quick flicks" often throughout the day (each waking hour) even when you don't need feel the urge to go.  This will help strengthen your pelvic floor muscles, making them more effective in controlling leakage.  Urge Drill  When you feel an urge to go, follow these steps to regain control: Stop what you are doing and be still Take one deep breath, directing your air into your abdomen Think an affirming thought, such as "I've got this." Do 5 quick flicks of your pelvic floor Walk with control to the bathroom to void, or delay voiding     BrT[facility] [id number]anon[place] [address]one # [phone]

## 2022-07-25 ENCOUNTER — Ambulatory Visit: Payer: PPO | Admitting: Student

## 2022-07-27 NOTE — H&P (View-Only) (Signed)
  Electrophysiology Office Note:   Date:  07/28/2022  ID:  Valerie Love, Valerie Love [birth year], MRN [id number]  Primary Cardiologist: Quay Burow, MD Electrophysiologist: Constance Haw, MD   History of Present Illness:   Valerie Love is a 67 y.o. female seen today for routine electrophysiology followup. Since last being seen in our clinic the patient reports doing very well.  she denies chest pain, palpitations, dyspnea, PND, orthopnea, nausea, vomiting, dizziness, syncope, edema, weight gain, or early satiety.   Review of systems complete and found to be negative unless listed in HPI.   Studies Reviewed:    EKG is ordered today. Personal review shows sinus bradycardia at 58 bpm   Risk Assessment/Calculations:             Physical Exam:   VS:  BP 108/66   Pulse (!) 58   Ht 5\' 1"  (1.549 m)   Wt 140 lb 9.6 oz (63.8 kg)   SpO2 99%   BMI 26.57 kg/m    Wt Readings from Last 3 Encounters:  07/28/22 140 lb 9.6 oz (63.8 kg)  06/13/22 139 lb 12.8 oz (63.4 kg)  03/03/22 139 lb 12.8 oz (63.4 kg)     GEN: Well nourished, well developed in no acute distress NECK: No JVD; No carotid bruits CARDIAC: Regular rate and rhythm, no murmurs, rubs, gallops RESPIRATORY:  Clear to auscultation without rales, wheezing or rhonchi  ABDOMEN: Soft, non-tender, non-distended EXTREMITIES:  No edema; No deformity   ASSESSMENT AND PLAN:    Paroxysmal atrial fibrillation CHA2DS2VASc  is at least 1 Continue eliquis 5 mg BID Continue diltiazem 120 mg daily.  She is scheduled for ablation with Dr. Curt Bears 4/4. 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  Follow up with Afib Clinic as usual post procedure  Signed, Valerie Friar, PA-C

## 2022-07-27 NOTE — Progress Notes (Unsigned)
  Electrophysiology Office Note:   Date:  07/28/2022  ID:  Valerie, Love [birth year], MRN [id number]  Primary Cardiologist: Quay Burow, MD Electrophysiologist: Constance Haw, MD   History of Present Illness:   Valerie Love is a 67 y.o. female seen today for routine electrophysiology followup. Since last being seen in our clinic the patient reports doing very well.  she denies chest pain, palpitations, dyspnea, PND, orthopnea, nausea, vomiting, dizziness, syncope, edema, weight gain, or early satiety.   Review of systems complete and found to be negative unless listed in HPI.   Studies Reviewed:    EKG is ordered today. Personal review shows sinus bradycardia at 58 bpm   Risk Assessment/Calculations:             Physical Exam:   VS:  BP 108/66   Pulse (!) 58   Ht 5\' 1"  (1.549 m)   Wt 140 lb 9.6 oz (63.8 kg)   SpO2 99%   BMI 26.57 kg/m    Wt Readings from Last 3 Encounters:  07/28/22 140 lb 9.6 oz (63.8 kg)  06/13/22 139 lb 12.8 oz (63.4 kg)  03/03/22 139 lb 12.8 oz (63.4 kg)     GEN: Well nourished, well developed in no acute distress NECK: No JVD; No carotid bruits CARDIAC: Regular rate and rhythm, no murmurs, rubs, gallops RESPIRATORY:  Clear to auscultation without rales, wheezing or rhonchi  ABDOMEN: Soft, non-tender, non-distended EXTREMITIES:  No edema; No deformity   ASSESSMENT AND PLAN:    Paroxysmal atrial fibrillation CHA2DS2VASc  is at least 1 Continue eliquis 5 mg BID Continue diltiazem 120 mg daily.  She is scheduled for ablation with Dr. Curt Bears 4/4. 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  Follow up with Afib Clinic as usual post procedure  Signed, Shirley Friar, PA-C

## 2022-07-28 ENCOUNTER — Ambulatory Visit: Payer: PPO | Attending: Student | Admitting: Student

## 2022-07-28 ENCOUNTER — Encounter: Payer: Self-pay | Admitting: Student

## 2022-07-28 VITALS — BP 108/66 | HR 58 | Ht 61.0 in | Wt 140.6 lb

## 2022-07-28 DIAGNOSIS — I480 Paroxysmal atrial fibrillation: Secondary | ICD-10-CM | POA: Diagnosis not present

## 2022-07-28 DIAGNOSIS — I447 Left bundle-branch block, unspecified: Secondary | ICD-10-CM | POA: Diagnosis not present

## 2022-07-28 NOTE — Patient Instructions (Signed)
Medication Instructions:  Your physician recommends that you continue on your current medications as directed. Please refer to the Current Medication list given to you today.  *If you need a refill on your cardiac medications before your next appointment, please call your pharmacy*   Lab Work: BMET, CBC today If you have labs (blood work) drawn today and your tests are completely normal, you will receive your results only by: [place] (if you have MyChart) OR A paper copy in the mail If you have any lab test that is abnormal or we need to change your treatment, we will call you to review the results.   Testing/Procedures: See letters   Follow-Up: At [facility], you and your health needs are our priority.  As part of our continuing mission to provide you with exceptional heart care, we have created designated Provider Care Teams.  These Care Teams include your primary Cardiologist (physician) and Advanced Practice Providers (APPs -  Physician Assistants and Nurse Practitioners) who all work together to provide you with the care you need, when you need it.  Your next appointment:   Keep appointments as scheduled

## 2022-07-29 LAB — BASIC METABOLIC PANEL
BUN/Creatinine Ratio: 24 (ref 12–28)
BUN: 13 mg/dL (ref 8–27)
CO2: 25 mmol/L (ref 20–29)
Calcium: 9.7 mg/dL (ref 8.7–10.3)
Chloride: 104 mmol/L (ref 96–106)
Creatinine, Ser: 0.55 mg/dL — ABNORMAL LOW (ref 0.57–1.00)
Glucose: 89 mg/dL (ref 70–99)
Potassium: 4.5 mmol/L (ref 3.5–5.2)
Sodium: 143 mmol/L (ref 134–144)
eGFR: 101 mL/min/{1.73_m2} (ref >59)

## 2022-07-29 LAB — CBC
Hematocrit: 43.2 % (ref 34.0–46.6)
Hemoglobin: 13.9 g/dL (ref 11.1–15.9)
MCH: 30.9 pg (ref 26.6–33.0)
MCHC: 32.2 g/dL (ref 31.5–35.7)
MCV: 96 fL (ref 79–97)
Platelets: 258 10*3/uL (ref 150–450)
RBC: 4.5 x10E6/uL (ref 3.77–5.28)
RDW: 12.4 % (ref 11.7–15.4)
WBC: 4.9 10*3/uL (ref 3.4–10.8)

## 2022-08-03 ENCOUNTER — Telehealth (HOSPITAL_COMMUNITY): Payer: Self-pay | Admitting: *Deleted

## 2022-08-03 NOTE — Telephone Encounter (Signed)
Attempted to call patient regarding upcoming cardiac CT appointment. °Left message on voicemail with name and callback number ° °Issabela Lesko RN Navigator Cardiac Imaging °[facility] °[phone] Office °[phone] Cell ° °

## 2022-08-04 ENCOUNTER — Ambulatory Visit (HOSPITAL_BASED_OUTPATIENT_CLINIC_OR_DEPARTMENT_OTHER)
Admission: RE | Admit: 2022-08-04 | Discharge: 2022-08-04 | Disposition: A | Discharge status: Home / Self Care | Payer: PPO | Source: Ambulatory Visit | Attending: Cardiology | Admitting: Cardiology

## 2022-08-04 DIAGNOSIS — I480 Paroxysmal atrial fibrillation: Secondary | ICD-10-CM | POA: Diagnosis not present

## 2022-08-04 MED ORDER — IOHEXOL 350 MG/ML SOLN
100.0000 mL | Freq: Once | INTRAVENOUS | Status: AC | PRN
  Administered 2022-08-04: 80 mL via INTRAVENOUS

## 2022-08-06 ENCOUNTER — Other Ambulatory Visit: Payer: Self-pay | Admitting: Family

## 2022-08-10 NOTE — Pre-Procedure Instructions (Signed)
Attempted to call patient regarding procedure instructions.  Left voicemail on the following items: Arrival time 5:15 Nothing to eat or drink after midnight No meds AM of procedure Responsible person to drive you home and stay with you for 24 hrs  Have you missed any doses of anti-coagulant Eliquis- if you have missed any doses please let office know right away.  Don't take dose in the morning

## 2022-08-10 NOTE — Anesthesia Preprocedure Evaluation (Signed)
Anesthesia Evaluation  Patient identified by MRN, date of birth, ID band Patient awake    Reviewed: Allergy & Precautions, NPO status , Patient's Chart, lab work & pertinent test results  History of Anesthesia Complications (+) PROLONGED EMERGENCE and history of anesthetic complications  Airway Mallampati: II  TM Distance: >3 FB Neck ROM: Full    Dental  (+) Teeth Intact, Dental Advisory Given   Pulmonary asthma , former smoker   Pulmonary exam normal breath sounds clear to auscultation       Cardiovascular hypertension, Pt. on medications (-) angina + Past MI  Normal cardiovascular exam+ dysrhythmias (LBBB) Atrial Fibrillation  Rhythm:Regular Rate:Normal  TTE 2023 IMPRESSIONS     1. Left ventricular ejection fraction, by estimation, is 55 to 60%. The  left ventricle has normal function. The left ventricle has no regional  wall motion abnormalities. Left ventricular diastolic parameters are  consistent with Grade I diastolic  dysfunction (impaired relaxation).   2. Right ventricular systolic function is normal. The right ventricular  size is normal. There is normal pulmonary artery systolic pressure.   3. The mitral valve is normal in structure. Mild mitral valve  regurgitation. No evidence of mitral stenosis.   4. The aortic valve is normal in structure. Aortic valve regurgitation is  mild. No aortic stenosis is present.   5. The inferior vena cava is dilated in size with >50% respiratory  variability, suggesting right atrial pressure of 8 mmHg.      Neuro/Psych  PSYCHIATRIC DISORDERS Anxiety Depression    negative neurological ROS     GI/Hepatic negative GI ROS, Neg liver ROS,,,  Endo/Other  negative endocrine ROS    Renal/GU negative Renal ROS     Musculoskeletal  (+) Arthritis ,    Abdominal   Peds  Hematology  (+) Blood dyscrasia (Eliquis)   Anesthesia Other Findings Day of surgery medications  reviewed with the patient.  Reproductive/Obstetrics                             Anesthesia Physical Anesthesia Plan  ASA: 3  Anesthesia Plan: General   Post-op Pain Management: Tylenol PO (pre-op)*   Induction: Intravenous  PONV Risk Score and Plan: 3 and Dexamethasone and Ondansetron  Airway Management Planned: Oral ETT  Additional Equipment:   Intra-op Plan:   Post-operative Plan: Extubation in OR  Informed Consent: I have reviewed the patients History and Physical, chart, labs and discussed the procedure including the risks, benefits and alternatives for the proposed anesthesia with the patient or authorized representative who has indicated his/her understanding and acceptance.     Dental advisory given  Plan Discussed with: CRNA  Anesthesia Plan Comments:        Anesthesia Quick Evaluation

## 2022-08-11 ENCOUNTER — Encounter (HOSPITAL_COMMUNITY): Payer: Self-pay | Admitting: Cardiology

## 2022-08-11 ENCOUNTER — Ambulatory Visit (HOSPITAL_COMMUNITY): Payer: PPO | Admitting: Anesthesiology

## 2022-08-11 ENCOUNTER — Encounter (HOSPITAL_COMMUNITY)
Admission: RE | Disposition: A | Discharge status: Home / Self Care | Payer: PPO | Source: Home / Self Care | Attending: Cardiology

## 2022-08-11 ENCOUNTER — Ambulatory Visit (HOSPITAL_COMMUNITY)
Admission: RE | Admit: 2022-08-11 | Discharge: 2022-08-11 | Disposition: A | Discharge status: Home / Self Care | Payer: PPO | Attending: Cardiology | Admitting: Cardiology

## 2022-08-11 ENCOUNTER — Ambulatory Visit (HOSPITAL_BASED_OUTPATIENT_CLINIC_OR_DEPARTMENT_OTHER): Payer: PPO | Admitting: Anesthesiology

## 2022-08-11 DIAGNOSIS — I4891 Unspecified atrial fibrillation: Secondary | ICD-10-CM | POA: Diagnosis not present

## 2022-08-11 DIAGNOSIS — I480 Paroxysmal atrial fibrillation: Secondary | ICD-10-CM | POA: Insufficient documentation

## 2022-08-11 DIAGNOSIS — I5020 Unspecified systolic (congestive) heart failure: Secondary | ICD-10-CM | POA: Insufficient documentation

## 2022-08-11 DIAGNOSIS — Z87891 Personal history of nicotine dependence: Secondary | ICD-10-CM

## 2022-08-11 DIAGNOSIS — Z79899 Other long term (current) drug therapy: Secondary | ICD-10-CM | POA: Insufficient documentation

## 2022-08-11 DIAGNOSIS — J45909 Unspecified asthma, uncomplicated: Secondary | ICD-10-CM

## 2022-08-11 DIAGNOSIS — I252 Old myocardial infarction: Secondary | ICD-10-CM | POA: Diagnosis not present

## 2022-08-11 DIAGNOSIS — Z7901 Long term (current) use of anticoagulants: Secondary | ICD-10-CM | POA: Diagnosis not present

## 2022-08-11 DIAGNOSIS — I10 Essential (primary) hypertension: Secondary | ICD-10-CM

## 2022-08-11 HISTORY — PX: ATRIAL FIBRILLATION ABLATION: EP1191

## 2022-08-11 LAB — POCT ACTIVATED CLOTTING TIME
Activated Clotting Time: 282 seconds
Activated Clotting Time: 374 seconds
Activated Clotting Time: 239 seconds

## 2022-08-11 SURGERY — ATRIAL FIBRILLATION ABLATION
Anesthesia: General

## 2022-08-11 MED ORDER — ROCURONIUM BROMIDE 10 MG/ML (PF) SYRINGE
PREFILLED_SYRINGE | INTRAVENOUS | Status: DC | PRN
  Administered 2022-08-11: 40 mg via INTRAVENOUS
  Administered 2022-08-11: 10 mg via INTRAVENOUS

## 2022-08-11 MED ORDER — LIDOCAINE 2% (20 MG/ML) 5 ML SYRINGE
INTRAMUSCULAR | Status: DC | PRN
  Administered 2022-08-11: 60 mg via INTRAVENOUS

## 2022-08-11 MED ORDER — ONDANSETRON HCL 4 MG/2ML IJ SOLN
INTRAMUSCULAR | Status: DC | PRN
  Administered 2022-08-11: 4 mg via INTRAVENOUS

## 2022-08-11 MED ORDER — PROPOFOL 10 MG/ML IV BOLUS
INTRAVENOUS | Status: DC | PRN
  Administered 2022-08-11: 20 mg via INTRAVENOUS
  Administered 2022-08-11: 100 mg via INTRAVENOUS
  Administered 2022-08-11: 50 mg via INTRAVENOUS
  Administered 2022-08-11: 30 mg via INTRAVENOUS

## 2022-08-11 MED ORDER — SODIUM CHLORIDE 0.9 % IV SOLN: INTRAVENOUS | Status: DC

## 2022-08-11 MED ORDER — ACETAMINOPHEN 325 MG PO TABS: 650.0000 mg | ORAL_TABLET | ORAL | Status: DC | PRN

## 2022-08-11 MED ORDER — DEXAMETHASONE SODIUM PHOSPHATE 10 MG/ML IJ SOLN
INTRAMUSCULAR | Status: DC | PRN
  Administered 2022-08-11: 10 mg via INTRAVENOUS

## 2022-08-11 MED ORDER — DOBUTAMINE INFUSION FOR EP/ECHO/NUC (1000 MCG/ML)
INTRAVENOUS | Status: DC | PRN
  Administered 2022-08-11: 20 ug/kg/min via INTRAVENOUS

## 2022-08-11 MED ORDER — ONDANSETRON HCL 4 MG/2ML IJ SOLN: 4.0000 mg | Freq: Four times a day (QID) | INTRAMUSCULAR | Status: DC | PRN

## 2022-08-11 MED ORDER — SODIUM CHLORIDE 0.9% FLUSH: 3.0000 mL | INTRAVENOUS | Status: DC | PRN

## 2022-08-11 MED ORDER — HEPARIN SODIUM (PORCINE) 1000 UNIT/ML IJ SOLN
INTRAMUSCULAR | Status: DC | PRN
  Administered 2022-08-11: 1000 [IU] via INTRAVENOUS

## 2022-08-11 MED ORDER — PHENYLEPHRINE HCL-NACL 20-0.9 MG/250ML-% IV SOLN
INTRAVENOUS | Status: DC | PRN
  Administered 2022-08-11: 30 ug/min via INTRAVENOUS

## 2022-08-11 MED ORDER — SUGAMMADEX SODIUM 200 MG/2ML IV SOLN
INTRAVENOUS | Status: DC | PRN
  Administered 2022-08-11: 200 mg via INTRAVENOUS

## 2022-08-11 MED ORDER — ACETAMINOPHEN 500 MG PO TABS
1000.0000 mg | ORAL_TABLET | Freq: Once | ORAL | Status: AC
  Administered 2022-08-11: 1000 mg via ORAL
  Filled 2022-08-11: qty 2

## 2022-08-11 MED ORDER — FENTANYL CITRATE (PF) 250 MCG/5ML IJ SOLN
INTRAMUSCULAR | Status: DC | PRN
  Administered 2022-08-11: 100 ug via INTRAVENOUS

## 2022-08-11 MED ORDER — DOBUTAMINE INFUSION FOR EP/ECHO/NUC (1000 MCG/ML)
INTRAVENOUS | Status: AC
  Filled 2022-08-11: qty 250

## 2022-08-11 MED ORDER — PROTAMINE SULFATE 10 MG/ML IV SOLN
INTRAVENOUS | Status: DC | PRN
  Administered 2022-08-11: 40 mg via INTRAVENOUS
  Administered 2022-08-11: 20 mg via INTRAVENOUS

## 2022-08-11 MED ORDER — HEPARIN SODIUM (PORCINE) 1000 UNIT/ML IJ SOLN
INTRAMUSCULAR | Status: DC | PRN
  Administered 2022-08-11: 14000 [IU] via INTRAVENOUS
  Administered 2022-08-11: 5000 [IU] via INTRAVENOUS

## 2022-08-11 MED ORDER — HEPARIN SODIUM (PORCINE) 1000 UNIT/ML IJ SOLN
INTRAMUSCULAR | Status: AC
  Filled 2022-08-11: qty 10

## 2022-08-11 MED ORDER — SODIUM CHLORIDE 0.9 % IV SOLN: 250.0000 mL | INTRAVENOUS | Status: DC | PRN

## 2022-08-11 MED ORDER — HEPARIN (PORCINE) IN NACL 1000-0.9 UT/500ML-% IV SOLN
INTRAVENOUS | Status: DC | PRN
  Administered 2022-08-11 (×4): 500 mL

## 2022-08-11 SURGICAL SUPPLY — 19 items
BAG SNAP BAND KOVER 36X36 (MISCELLANEOUS) IMPLANT
BLANKET WARM UNDERBOD FULL ACC (MISCELLANEOUS) ×1 IMPLANT
CATH ABLAT QDOT MICRO BI TC DF (CATHETERS) IMPLANT
CATH OCTARAY 2.0 F 3-3-3-3-3 (CATHETERS) IMPLANT
CATH PIGTAIL STEERABLE D1 8.7 (WIRE) IMPLANT
CATH S-M CIRCA TEMP PROBE (CATHETERS) IMPLANT
CATH SOUNDSTAR ECO 8FR (CATHETERS) IMPLANT
CATH WEBSTER BI DIR CS D-F CRV (CATHETERS) IMPLANT
CLOSURE PERCLOSE PROSTYLE (VASCULAR PRODUCTS) IMPLANT
COVER SWIFTLINK CONNECTOR (BAG) ×1 IMPLANT
PACK EP LATEX FREE (CUSTOM PROCEDURE TRAY) ×1
PACK EP LF (CUSTOM PROCEDURE TRAY) ×1 IMPLANT
PAD DEFIB RADIO PHYSIO CONN (PAD) ×1 IMPLANT
PATCH CARTO3 (PAD) IMPLANT
SHEATH CARTO VIZIGO SM CVD (SHEATH) IMPLANT
SHEATH PINNACLE 7F 10CM (SHEATH) IMPLANT
SHEATH PINNACLE 8F 10CM (SHEATH) IMPLANT
SHEATH PINNACLE 9F 10CM (SHEATH) IMPLANT
SHEATH PROBE COVER 6X72 (BAG) IMPLANT

## 2022-08-11 NOTE — Interval H&P Note (Signed)
History and Physical Interval Note:  08/11/2022 7:10 AM  Valerie Love  has presented today for surgery, with the diagnosis of afib.  The various methods of treatment have been discussed with the patient and family. After consideration of risks, benefits and other options for treatment, the patient has consented to  Procedure(s): ATRIAL FIBRILLATION ABLATION (N/A) as a surgical intervention.  The patient's history has been reviewed, patient examined, no change in status, stable for surgery.  I have reviewed the patient's chart and labs.  Questions were answered to the patient's satisfaction.     Allysen Lazo [employer]

## 2022-08-11 NOTE — Anesthesia Procedure Notes (Signed)
Procedure Name: Intubation Date/Time: 08/11/2022 7:44 AM  Performed by: Heide Scales, CRNAPre-anesthesia Checklist: Patient identified, Emergency Drugs available, Suction available and Patient being monitored Patient Re-evaluated:Patient Re-evaluated prior to induction Oxygen Delivery Method: Circle system utilized Preoxygenation: Pre-oxygenation with 100% oxygen Induction Type: IV induction Ventilation: Mask ventilation without difficulty Laryngoscope Size: Mac and 3 Grade View: Grade I Tube type: Oral Tube size: 7.0 mm Number of attempts: 1 Airway Equipment and Method: Stylet and Oral airway Placement Confirmation: ETT inserted through vocal cords under direct vision, positive ETCO2 and breath sounds checked- equal and bilateral Secured at: 22 cm Tube secured with: Tape Dental Injury: Teeth and Oropharynx as per pre-operative assessment

## 2022-08-11 NOTE — Discharge Instructions (Signed)

## 2022-08-11 NOTE — Progress Notes (Signed)
Patient and daughter was given discharge instructions. Both verbalized understanding. 

## 2022-08-11 NOTE — Transfer of Care (Signed)
Immediate Anesthesia Transfer of Care Note  Patient: Valerie Love  Procedure(s) Performed: ATRIAL FIBRILLATION ABLATION  Patient Location: PACU  Anesthesia Type:General  Level of Consciousness: awake, alert , and oriented  Airway & Oxygen Therapy: Patient Spontanous Breathing  Post-op Assessment: Report given to RN, Post -op Vital signs reviewed and stable, and Patient moving all extremities X 4  Post vital signs: Reviewed and stable  Last Vitals:  Vitals Value Taken Time  BP 105/67 08/11/22 1010  Temp    Pulse 71 08/11/22 1012  Resp 11 08/11/22 1012  SpO2 97 % 08/11/22 1012  Vitals shown include unvalidated device data.  Last Pain:  Vitals:   08/11/22 0621  TempSrc: Oral  PainSc:          Complications: There were no known notable events for this encounter.

## 2022-08-12 NOTE — Anesthesia Postprocedure Evaluation (Signed)
Anesthesia Post Note  Patient: Valerie Love  Procedure(s) Performed: ATRIAL FIBRILLATION ABLATION     Patient location during evaluation: Cath Lab Anesthesia Type: General Level of consciousness: awake and alert Pain management: pain level controlled Vital Signs Assessment: post-procedure vital signs reviewed and stable Respiratory status: spontaneous breathing, nonlabored ventilation, respiratory function stable and patient connected to nasal cannula oxygen Cardiovascular status: blood pressure returned to baseline and stable Postop Assessment: no apparent nausea or vomiting Anesthetic complications: no   There were no known notable events for this encounter.  Last Vitals:  Vitals:   08/11/22 1200 08/11/22 1300  BP: 119/79 117/66  Pulse: 70 82  Resp: 16 20  Temp:    SpO2: 96% 96%    Last Pain:  Vitals:   08/11/22 1041  TempSrc: Temporal  PainSc: 0-No pain                 Collene Schlichter

## 2022-08-15 ENCOUNTER — Ambulatory Visit (INDEPENDENT_AMBULATORY_CARE_PROVIDER_SITE_OTHER): Payer: PPO

## 2022-08-15 VITALS — Ht 61.0 in | Wt 140.0 lb

## 2022-08-15 DIAGNOSIS — Z0000 Encounter for general adult medical examination without abnormal findings: Secondary | ICD-10-CM

## 2022-08-15 NOTE — Patient Instructions (Addendum)
Valerie Love , Thank you for taking time to come for your Medicare Wellness Visit. I appreciate your ongoing commitment to your health goals. Please review the following plan we discussed and let me know if I can assist you in the future.   These are the goals we discussed:  Goals       Increase physical activity (pt-stated)      I would love to Continue walking and go to the gym.        This is a list of the screening recommended for you and due dates:  Health Maintenance  Topic Date Due   COVID-19 Vaccine (4 - 2023-24 season) 09/11/2022*   Pneumonia Vaccine (2 of 2 - PCV) 08/15/2023*   Flu Shot  12/08/2022   Medicare Annual Wellness Visit  08/15/2023   Mammogram  10/06/2023   Colon Cancer Screening  01/05/2025   DTaP/Tdap/Td vaccine (3 - Td or Tdap) 02/22/2029   DEXA scan (bone density measurement)  Completed   Hepatitis C Screening: USPSTF Recommendation to screen - Ages 79-79 yo.  Completed   Zoster (Shingles) Vaccine  Completed   HPV Vaccine  Aged Out  *Topic was postponed. The date shown is not the original due date.    Advanced directives: Please bring a copy of your health care power of attorney and living will to the office to be added to your chart at your convenience.   Conditions/risks identified: None  Next appointment: Follow up in one year for your annual wellness visit    Preventive Care 65 Years and Older, Female Preventive care refers to lifestyle choices and visits with your health care provider that can promote health and wellness. What does preventive care include? A yearly physical exam. This is also called an annual well check. Dental exams once or twice a year. Routine eye exams. Ask your health care provider how often you should have your eyes checked. Personal lifestyle choices, including: Daily care of your teeth and gums. Regular physical activity. Eating a healthy diet. Avoiding tobacco and drug use. Limiting alcohol use. Practicing safe  sex. Taking low-dose aspirin every day. Taking vitamin and mineral supplements as recommended by your health care provider. What happens during an annual well check? The services and screenings done by your health care provider during your annual well check will depend on your age, overall health, lifestyle risk factors, and family history of disease. Counseling  Your health care provider may ask you questions about your: Alcohol use. Tobacco use. Drug use. Emotional well-being. Home and relationship well-being. Sexual activity. Eating habits. History of falls. Memory and ability to understand (cognition). Work and work Astronomer. Reproductive health. Screening  You may have the following tests or measurements: Height, weight, and BMI. Blood pressure. Lipid and cholesterol levels. These may be checked every 5 years, or more frequently if you are over 53 years old. Skin check. Lung cancer screening. You may have this screening every year starting at age 46 if you have a 30-pack-year history of smoking and currently smoke or have quit within the past 15 years. Fecal occult blood test (FOBT) of the stool. You may have this test every year starting at age 50. Flexible sigmoidoscopy or colonoscopy. You may have a sigmoidoscopy every 5 years or a colonoscopy every 10 years starting at age 33. Hepatitis C blood test. Hepatitis B blood test. Sexually transmitted disease (STD) testing. Diabetes screening. This is done by checking your blood sugar (glucose) after you have not eaten for  a while (fasting). You may have this done every 1-3 years. Bone density scan. This is done to screen for osteoporosis. You may have this done starting at age 27. Mammogram. This may be done every 1-2 years. Talk to your health care provider about how often you should have regular mammograms. Talk with your health care provider about your test results, treatment options, and if necessary, the need for more  tests. Vaccines  Your health care provider may recommend certain vaccines, such as: Influenza vaccine. This is recommended every year. Tetanus, diphtheria, and acellular pertussis (Tdap, Td) vaccine. You may need a Td booster every 10 years. Zoster vaccine. You may need this after age 21. Pneumococcal 13-valent conjugate (PCV13) vaccine. One dose is recommended after age 61. Pneumococcal polysaccharide (PPSV23) vaccine. One dose is recommended after age 6. Talk to your health care provider about which screenings and vaccines you need and how often you need them. This information is not intended to replace advice given to you by your health care provider. Make sure you discuss any questions you have with your health care provider. Document Released: 05/22/2015 Document Revised: 01/13/2016 Document Reviewed: 02/24/2015 Elsevier Interactive Patient Education  2017 [employer] Prevention in the Home Falls can cause injuries. They can happen to people of all ages. There are many things you can do to make your home safe and to help prevent falls. What can I do on the outside of my home? Regularly fix the edges of walkways and driveways and fix any cracks. Remove anything that might make you trip as you walk through a door, such as a raised step or threshold. Trim any bushes or trees on the path to your home. Use bright outdoor lighting. Clear any walking paths of anything that might make someone trip, such as rocks or tools. Regularly check to see if handrails are loose or broken. Make sure that both sides of any steps have handrails. Any raised decks and porches should have guardrails on the edges. Have any leaves, snow, or ice cleared regularly. Use sand or salt on walking paths during winter. Clean up any spills in your garage right away. This includes oil or grease spills. What can I do in the bathroom? Use night lights. Install grab bars by the toilet and in the tub and shower.  Do not use towel bars as grab bars. Use non-skid mats or decals in the tub or shower. If you need to sit down in the shower, use a plastic, non-slip stool. Keep the floor dry. Clean up any water that spills on the floor as soon as it happens. Remove soap buildup in the tub or shower regularly. Attach bath mats securely with double-sided non-slip rug tape. Do not have throw rugs and other things on the floor that can make you trip. What can I do in the bedroom? Use night lights. Make sure that you have a light by your bed that is easy to reach. Do not use any sheets or blankets that are too big for your bed. They should not hang down onto the floor. Have a firm chair that has side arms. You can use this for support while you get dressed. Do not have throw rugs and other things on the floor that can make you trip. What can I do in the kitchen? Clean up any spills right away. Avoid walking on wet floors. Keep items that you use a lot in easy-to-reach places. If you need to reach something above  you, use a strong step stool that has a grab bar. Keep electrical cords out of the way. Do not use floor polish or wax that makes floors slippery. If you must use wax, use non-skid floor wax. Do not have throw rugs and other things on the floor that can make you trip. What can I do with my stairs? Do not leave any items on the stairs. Make sure that there are handrails on both sides of the stairs and use them. Fix handrails that are broken or loose. Make sure that handrails are as long as the stairways. Check any carpeting to make sure that it is firmly attached to the stairs. Fix any carpet that is loose or worn. Avoid having throw rugs at the top or bottom of the stairs. If you do have throw rugs, attach them to the floor with carpet tape. Make sure that you have a light switch at the top of the stairs and the bottom of the stairs. If you do not have them, ask someone to add them for you. What else  can I do to help prevent falls? Wear shoes that: Do not have high heels. Have rubber bottoms. Are comfortable and fit you well. Are closed at the toe. Do not wear sandals. If you use a stepladder: Make sure that it is fully opened. Do not climb a closed stepladder. Make sure that both sides of the stepladder are locked into place. Ask someone to hold it for you, if possible. Clearly mark and make sure that you can see: Any grab bars or handrails. First and last steps. Where the edge of each step is. Use tools that help you move around (mobility aids) if they are needed. These include: Canes. Walkers. Scooters. Crutches. Turn on the lights when you go into a dark area. Replace any light bulbs as soon as they burn out. Set up your furniture so you have a clear path. Avoid moving your furniture around. If any of your floors are uneven, fix them. If there are any pets around you, be aware of where they are. Review your medicines with your doctor. Some medicines can make you feel dizzy. This can increase your chance of falling. Ask your doctor what other things that you can do to help prevent falls. This information is not intended to replace advice given to you by your health care provider. Make sure you discuss any questions you have with your health care provider. Document Released: 02/19/2009 Document Revised: 10/01/2015 Document Reviewed: 05/30/2014 Elsevier Interactive Patient Education  2017 [employer].

## 2022-08-15 NOTE — Progress Notes (Signed)
Subjective:   Valerie Love is a 67 y.o. female who presents for Medicare Annual (Subsequent) preventive examination.  Review of Systems    Virtual Visit via Telephone Note  I connected with  Valerie ScotJudy Tamer Robeck on 08/15/22 at  2:30 PM EDT by telephone and verified that I am speaking with the correct person using two identifiers.  Location: Patient: Home Provider: Office Persons participating in the virtual visit: patient/Nurse Health Advisor   I discussed the limitations, risks, security and privacy concerns of performing an evaluation and management service by telephone and the availability of in person appointments. The patient expressed understanding and agreed to proceed.  Interactive audio and video telecommunications were attempted between this nurse and patient, however failed, due to patient having technical difficulties OR patient did not have access to video capability.  We continued and completed visit with audio only.  Some vital signs may be absent or patient reported.   Tillie RungBeverly W Zakiyah Diop, LPN  Cardiac Risk Factors include: advanced age (>[id number], 26>65 women);Other (see comment), Risk factor comments: AFIB     Objective:    Today's Vitals   08/15/22 1450 08/15/22 1451  Weight: 140 lb (63.5 kg)   Height: 5\' 1"  (1.549 m)   PainSc:  0-No pain   Body mass index is 26.45 kg/m.     08/15/2022    2:59 PM 08/11/2022    6:17 AM 06/21/2022   11:15 AM 11/10/2021    9:00 PM 08/11/2021    3:05 PM 08/04/2021    9:31 AM 10/30/2018    4:02 PM  Advanced Directives  Does Patient Have a Medical Advance Directive? Yes Yes Yes No Yes Yes No  Type of Estate agentAdvance Directive Healthcare Power of ArthurAttorney;Living will Healthcare Power of [place]orney;Living will Healthcare Power of [place]Attorney;Living will;Out of facility DNR (pink MOST or yellow form)  Living will;Healthcare Power of Attorney Living will;Healthcare Power of Attorney   Does patient want to make changes to medical advance directive?  No  - Patient declined   No - Patient declined No - Patient declined   Copy of Healthcare Power of Attorney in Chart? No - copy requested Yes - validated most recent copy scanned in chart (See row information) No - copy requested  No - copy requested No - copy requested   Would patient like information on creating a medical advance directive?    No - Patient declined   No - Patient declined    Current Medications (verified) Outpatient Encounter Medications as of 08/15/2022  Medication Sig   acetaminophen (TYLENOL) 650 MG CR tablet Take 650-1,300 mg by mouth See admin instructions. Take 650 in the morning and 1300 mg in the evening   albuterol (VENTOLIN HFA) 108 (90 Base) MCG/ACT inhaler Inhale 2 puffs into the lungs every 6 (six) hours as needed for wheezing or shortness of breath.   ALPRAZolam (XANAX) 0.5 MG tablet TAKE 1 TABLET BY MOUTH THREE TIMES A DAY AS NEEDED FOR ANXIETY. MAXIMUM 3 TALBETS PER 24 HOURS   apixaban (ELIQUIS) 5 MG TABS tablet Take 1 tablet (5 mg total) by mouth 2 (two) times daily.   Calcium Carb-Cholecalciferol (CALCIUM+D3) 600-800 MG-UNIT TABS Take 1 tablet by mouth daily with breakfast.   citalopram (CELEXA) 20 MG tablet Take 1 tablet (20 mg total) by mouth daily.   diltiazem (CARDIZEM CD) 180 MG 24 hr capsule Take 1 capsule (180 mg total) by mouth daily.   diltiazem (CARDIZEM) 30 MG tablet Take one tablet by mouth every  4 hours as needed for HR greater than 100, top number B/P needs to be above 100   diphenhydrAMINE (BENADRYL) 25 MG tablet Take 25 mg by mouth at bedtime as needed for sleep or itching.   Multiple Vitamins-Minerals (ALIVE ONCE DAILY WOMENS 50+) TABS Take 1 tablet by mouth daily.   Probiotic Product (TRUBIOTICS) CAPS Take 1 capsule by mouth daily.   rosuvastatin (CRESTOR) 5 MG tablet Take 1 tablet (5 mg total) by mouth daily.   No facility-administered encounter medications on file as of 08/15/2022.    Allergies (verified) Codeine and Lactose intolerance (gi)    History: Past Medical History:  Diagnosis Date   Anxiety    Arthritis    R knee, back, hands    Cellulitis of knee 04/2016   RT KNEE   Cellulitis of right knee 04/18/2016   Childhood asthma    Complication of anesthesia    woke up slowly - 57yrs. ago   Depression    related to husband illness and death   History of jaundice    as a child     Hx of varicella    Hyperthyroidism    during pregnancy, treated & resolved post partum    IBS (irritable bowel syndrome)    LBBB (left bundle branch block)    Scoliosis    Scoliosis 07/2015   Thyroid disease in pregnancy    TOBACCO DEPENDENCE 07/06/2006   Qualifier: Diagnosis of  By: Knox Royalty     Troponin level elevated    Past Surgical History:  Procedure Laterality Date   ATRIAL FIBRILLATION ABLATION N/A 08/11/2022   Procedure: ATRIAL FIBRILLATION ABLATION;  Surgeon: Regan Lemming, MD;  Location: [facility];  Service: Cardiovascular;  Laterality: N/A;   BREAST BIOPSY Right 1979   benign    Cyst removed from ovary     KNEE SURGERY     right duda    LEFT HEART CATH AND CORONARY ANGIOGRAPHY N/A 10/31/2018   Procedure: LEFT HEART CATH AND CORONARY ANGIOGRAPHY;  Surgeon: Lennette Bihari, MD;  Location: [facility];  Service: Cardiovascular;  Laterality: N/A;   TONSILLECTOMY AND ADENOIDECTOMY  1073   TOTAL KNEE ARTHROPLASTY 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   Procedure: TOTAL KNEE ARTHROPLASTY;  Surgeon: [name], MD;  Location: [facility] OR;  Service: Orthopedics;  Laterality: Right;   TUBAL LIGATION     WISDOM TOOTH EXTRACTION     Family History  Problem Relation Age of Onset   Alcohol abuse Mother        died from stomach ulcers   Stroke Father        died  from cva and mi   Hypertension Father    Hyperlipidemia Father    Coronary artery disease Father        Age 64   Colon polyps Sister    Hepatitis C Brother    Colon cancer Neg Hx    Social History   Socioeconomic History   Marital status: Widowed    Spouse  name: Not on file   Number of children: 3   Years of education: Not on file   Highest education level: Not on file  Occupational History   Occupation: self    Comment: Catering and cleaning  Tobacco Use   Smoking status: Former    Packs/day: 0.25    Years: 40.00    Additional pack years: 0.00    Total pack years: 10.00    Types: Cigarettes  Quit date: 01/06/2016    Years since quitting: 6.6   Smokeless tobacco: Never   Tobacco comments:    Former smoker 11/16/21  Substance and Sexual Activity   Alcohol use: Yes    Alcohol/week: 7.0 standard drinks of alcohol    Types: 7 Glasses of wine per week    Comment: 1 glass of wine daily 11/16/21   Drug use: No   Sexual activity: Not on file  Other Topics Concern   Not on file  Social History Narrative   Husband passed away on 02-29-08 hep c and cirrhosis.   She is hep c negative 2006   G3P3   HH of 2-3  son no pets    No falls .  Has smoke detector and wears seat belts. POsitive  firearms. No excess sun exposure.    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   Had time of nono insuranced and cannot affort 800 per month for catastrophic    Stopped tobacco fall 2017 . ocass etoh.  No rec drugs.   Daughter patient in our practice   [name]   Social Determinants of Health   Financial Resource Strain: Low Risk  (08/15/2022)   Overall Financial Resource Strain (CARDIA)    Difficulty of Paying Living Expenses: Not hard at all  Food Insecurity: No Food Insecurity (08/15/2022)   Hunger Vital Sign    Worried About Running Out of Food in the Last Year: Never true    Ran Out of Food in the Last Year: Never true  Transportation Needs: No Transportation Needs (08/15/2022)   PRAPARE - Administrator, Civil Service (Medical): No    Lack of Transportation (Non-Medical): No  Physical Activity: Sufficiently Active (08/15/2022)   Exercise Vital Sign    Days of Exercise per Week: 5 days    Minutes of Exercise per  Session: 30 min  Stress: No Stress Concern Present (08/15/2022)   [employer] of Occupational Health - Occupational Stress Questionnaire    Feeling of Stress : Not at all  Social Connections: Moderately Integrated (08/15/2022)   Social Connection and Isolation Panel [NHANES]    Frequency of Communication with Friends and Family: More than three times a week    Frequency of Social Gatherings with Friends and Family: More than three times a week    Attends Religious Services: More than 4 times per year    Active Member of [employer] or Organizations: Yes    Attends Banker Meetings: More than 4 times per year    Marital Status: Widowed    Tobacco Counseling Counseling given: Not Answered Tobacco comments: Former smoker 11/16/21   Clinical Intake:  Pre-visit preparation completed: No  Pain : No/denies pain Pain Score: 0-No pain     BMI - recorded: 26.45 Nutritional Status: BMI 25 -29 Overweight Nutritional Risks: None Diabetes: No  How often do you need to have someone help you when you read instructions, pamphlets, or other written materials from your doctor or pharmacy?: 1 - Never  Diabetic?  No  Interpreter Needed?: No  Information entered by :: Theresa Mulligan LPN   Activities of Daily Living    08/15/2022    2:56 PM 11/10/2021    9:00 PM  In your present state of health, do you have any difficulty performing the following activities:  Hearing? 0 0  Vision? 0 0  Difficulty concentrating or making decisions? 0 0  Walking or climbing  stairs? 0 0  Dressing or bathing? 0 0  Doing errands, shopping? 0 0  Preparing Food and eating ? N   Using the Toilet? N   In the past six months, have you accidently leaked urine? Y   Comment Wears pads. Followed by PCP   Do you have problems with loss of bowel control? N   Managing your Medications? N   Managing your Finances? N   Housekeeping or managing your Housekeeping? N     Patient Care Team: Panosh, Neta Mends,  MD as PCP - General (Internal Medicine) Runell Gess, MD as PCP - Cardiology (Cardiology) Regan Lemming, MD as PCP - Electrophysiology (Cardiology) Mitchel Honour, DO as Attending Physician (Obstetrics and Gynecology) [name], MD as Consulting Physician (Orthopedic Surgery)  Indicate any recent Medical Services you may have received from other than [facility] providers in the past year (date may be approximate).     Assessment:   This is a routine wellness examination for Kimberly.  Hearing/Vision screen Hearing Screening - Comments:: Denies hearing difficulties   Vision Screening - Comments:: Wears rx glasses - up to date with routine eye exams with  [facility]  Dietary issues and exercise activities discussed: Exercise limited by: None identified   Goals Addressed               This Visit's Progress     Increase physical activity (pt-stated)        I would love to Continue walking and go to the gym.       Depression Screen    08/15/2022    2:56 PM 06/13/2022    2:45 PM 11/24/2021    3:33 PM 08/31/2021    8:54 AM 08/11/2021    2:59 PM 06/22/2021   11:08 AM 06/10/2019    1:55 PM  PHQ 2/9 Scores  PHQ - 2 Score 0 0 2  0 0 0  PHQ- 9 Score  4 6  0 5   Exception Documentation    Patient refusal       Fall Risk    08/15/2022    2:57 PM 06/13/2022    2:45 PM 11/24/2021    3:32 PM 08/11/2021    3:04 PM 06/22/2021   11:08 AM  Fall Risk   Falls in the past year? 0 0 0 0 0  Number falls in past yr: 0 0 0 0   Injury with Fall? 0 0 0 0   Risk for fall due to : No Fall Risks Other (Comment) No Fall Risks No Fall Risks   Follow up Falls prevention discussed Falls evaluation completed Falls prevention discussed      FALL RISK PREVENTION PERTAINING TO THE HOME:  Any stairs in or around the home? Yes  If so, are there any without handrails? No  Home free of loose throw rugs in walkways, pet beds, electrical cords, etc? Yes  Adequate lighting in your home to reduce risk of  falls? Yes   ASSISTIVE DEVICES UTILIZED TO PREVENT FALLS:  Life alert? No  Use of a cane, walker or w/c? No  Grab bars in the bathroom? Yes  Shower chair or bench in shower? No  Elevated toilet seat or a handicapped toilet? No   TIMED UP AND GO:  Was the test performed? No . Audio Visit   Cognitive Function:        08/15/2022    2:59 PM 08/11/2021    3:06 PM  6CIT Screen  What Year? 0 points 0 points  What month? 0 points 0 points  What time? 0 points 0 points  Count back from 20 0 points 0 points  Months in reverse 0 points 0 points  Repeat phrase 0 points 0 points  Total Score 0 points 0 points    Immunizations Immunization History  Administered Date(s) Administered   Fluad Quad(high Dose 65+) 03/23/2021   Influenza, Quadrivalent, Recombinant, Inj, Pf 01/30/2018, 12/21/2018   Influenza,inj,Quad PF,6+ Mos 06/05/2016, 01/30/2017, 01/07/2019   Influenza-Unspecified 12/21/2018   PFIZER(Purple Top)SARS-COV-2 Vaccination 07/19/2019, 08/12/2019, 05/08/2020   Pneumococcal Polysaccharide-23 12/27/2018   Tdap 08/18/2010, 02/23/2019   Zoster Recombinat (Shingrix) 02/23/2019, 04/27/2019    TDAP status: Up to date  Flu Vaccine status: Up to date  Pneumococcal vaccine status: Up to date  Covid-19 vaccine status: Completed vaccines  Qualifies for Shingles Vaccine? Yes   Zostavax completed Yes   Shingrix Completed?: Yes  Screening Tests Health Maintenance  Topic Date Due   COVID-19 Vaccine (4 - 2023-24 season) 09/11/2022 (Originally 01/07/2022)   Pneumonia Vaccine 30+ Years old (2 of 2 - PCV) 08/15/2023 (Originally 08/03/2020)   INFLUENZA VACCINE  12/08/2022   Medicare Annual Wellness (AWV)  08/15/2023   MAMMOGRAM  10/06/2023   COLONOSCOPY (Pts 45-56yrs Insurance coverage will need to be confirmed)  01/05/2025   DTaP/Tdap/Td (3 - Td or Tdap) 02/22/2029   DEXA SCAN  Completed   Hepatitis C Screening  Completed   Zoster Vaccines- Shingrix  Completed   HPV VACCINES  Aged  Out    Health Maintenance  There are no preventive care reminders to display for this patient.   Colorectal cancer screening: Type of screening: Colonoscopy. Completed 01/06/15. Repeat every 10 years  Mammogram status: Completed 10/05/21. Repeat every year  Bone Density status: Completed 12/27/17. Results reflect: Bone density results: OSTEOPOROSIS. Repeat every   years.  Lung Cancer Screening: (Low Dose CT Chest recommended if Age 90-80 years, 30 pack-year currently smoking OR have quit w/in 15years.) does not qualify.     Additional Screening:  Hepatitis C Screening: does qualify; Completed 05/12/03  Vision Screening: Recommended annual ophthalmology exams for early detection of glaucoma and other disorders of the eye. Is the patient up to date with their annual eye exam?  Yes  Who is the provider or what is the name of the office in which the patient attends annual eye exams? [facility] If pt is not established with a provider, would they like to be referred to a provider to establish care? No .   Dental Screening: Recommended annual dental exams for proper oral hygiene  Community Resource Referral / Chronic Care Management:  CRR required this visit?  No   CCM required this visit?  No      Plan:     I have personally reviewed and noted the following in the patient's chart:   Medical and social history Use of alcohol, tobacco or illicit drugs  Current medications and supplements including opioid prescriptions. Patient is not currently taking opioid prescriptions. Functional ability and status Nutritional status Physical activity Advanced directives List of other physicians Hospitalizations, surgeries, and ER visits in previous 12 months Vitals Screenings to include cognitive, depression, and falls Referrals and appointments  In addition, I have reviewed and discussed with patient certain preventive protocols, quality metrics, and best practice recommendations. A  written personalized care plan for preventive services as well as general preventive health recommendations were provided to patient.     Tillie Rung,  LPN   05/14/1094   Nurse Notes: None

## 2022-08-16 ENCOUNTER — Ambulatory Visit: Payer: PPO

## 2022-08-22 NOTE — Telephone Encounter (Signed)
Patient calling checking on status of this refill. Was routed to [facility]. Pt will be out of meds Wednesday

## 2022-08-23 ENCOUNTER — Ambulatory Visit: Payer: PPO | Attending: Family Medicine

## 2022-08-23 ENCOUNTER — Telehealth: Payer: Self-pay

## 2022-08-23 DIAGNOSIS — R293 Abnormal posture: Secondary | ICD-10-CM | POA: Insufficient documentation

## 2022-08-23 DIAGNOSIS — M62838 Other muscle spasm: Secondary | ICD-10-CM | POA: Insufficient documentation

## 2022-08-23 DIAGNOSIS — M6281 Muscle weakness (generalized): Secondary | ICD-10-CM | POA: Insufficient documentation

## 2022-08-23 DIAGNOSIS — R3915 Urgency of urination: Secondary | ICD-10-CM | POA: Insufficient documentation

## 2022-08-23 DIAGNOSIS — R279 Unspecified lack of coordination: Secondary | ICD-10-CM | POA: Insufficient documentation

## 2022-08-23 NOTE — Telephone Encounter (Signed)
LM for patient regarding late cancel last week and no-show this morning. She was asked to please call if she would like to be discharged or confirm her appointment next week.

## 2022-08-26 ENCOUNTER — Other Ambulatory Visit: Payer: Self-pay

## 2022-08-26 ENCOUNTER — Emergency Department (HOSPITAL_BASED_OUTPATIENT_CLINIC_OR_DEPARTMENT_OTHER): Payer: PPO | Admitting: Radiology

## 2022-08-26 ENCOUNTER — Encounter (HOSPITAL_BASED_OUTPATIENT_CLINIC_OR_DEPARTMENT_OTHER): Payer: Self-pay | Admitting: *Deleted

## 2022-08-26 ENCOUNTER — Other Ambulatory Visit (HOSPITAL_BASED_OUTPATIENT_CLINIC_OR_DEPARTMENT_OTHER): Payer: Self-pay

## 2022-08-26 ENCOUNTER — Emergency Department (HOSPITAL_BASED_OUTPATIENT_CLINIC_OR_DEPARTMENT_OTHER)
Admission: EM | Admit: 2022-08-26 | Discharge: 2022-08-26 | Disposition: A | Discharge status: Home / Self Care | Payer: PPO | Attending: Emergency Medicine | Admitting: Emergency Medicine

## 2022-08-26 DIAGNOSIS — W19XXXA Unspecified fall, initial encounter: Secondary | ICD-10-CM

## 2022-08-26 DIAGNOSIS — S90112A Contusion of left great toe without damage to nail, initial encounter: Secondary | ICD-10-CM | POA: Diagnosis not present

## 2022-08-26 DIAGNOSIS — I4891 Unspecified atrial fibrillation: Secondary | ICD-10-CM | POA: Diagnosis not present

## 2022-08-26 DIAGNOSIS — M1712 Unilateral primary osteoarthritis, left knee: Secondary | ICD-10-CM | POA: Diagnosis not present

## 2022-08-26 DIAGNOSIS — M25552 Pain in left hip: Secondary | ICD-10-CM | POA: Diagnosis not present

## 2022-08-26 DIAGNOSIS — R0781 Pleurodynia: Secondary | ICD-10-CM | POA: Diagnosis not present

## 2022-08-26 DIAGNOSIS — W108XXA Fall (on) (from) other stairs and steps, initial encounter: Secondary | ICD-10-CM | POA: Diagnosis not present

## 2022-08-26 DIAGNOSIS — S20219A Contusion of unspecified front wall of thorax, initial encounter: Secondary | ICD-10-CM | POA: Insufficient documentation

## 2022-08-26 DIAGNOSIS — S20212A Contusion of left front wall of thorax, initial encounter: Secondary | ICD-10-CM

## 2022-08-26 DIAGNOSIS — S82832A Other fracture of upper and lower end of left fibula, initial encounter for closed fracture: Secondary | ICD-10-CM | POA: Diagnosis not present

## 2022-08-26 DIAGNOSIS — Y9301 Activity, walking, marching and hiking: Secondary | ICD-10-CM | POA: Insufficient documentation

## 2022-08-26 DIAGNOSIS — S90122A Contusion of left lesser toe(s) without damage to nail, initial encounter: Secondary | ICD-10-CM | POA: Diagnosis not present

## 2022-08-26 DIAGNOSIS — S82192A Other fracture of upper end of left tibia, initial encounter for closed fracture: Secondary | ICD-10-CM | POA: Diagnosis not present

## 2022-08-26 DIAGNOSIS — Z7901 Long term (current) use of anticoagulants: Secondary | ICD-10-CM | POA: Diagnosis not present

## 2022-08-26 DIAGNOSIS — S8992XA Unspecified injury of left lower leg, initial encounter: Secondary | ICD-10-CM | POA: Diagnosis present

## 2022-08-26 DIAGNOSIS — S298XXA Other specified injuries of thorax, initial encounter: Secondary | ICD-10-CM

## 2022-08-26 MED ORDER — LIDOCAINE 5 % EX PTCH
1.0000 | MEDICATED_PATCH | CUTANEOUS | Status: DC
  Administered 2022-08-26: 1 via TRANSDERMAL
  Filled 2022-08-26: qty 1

## 2022-08-26 MED ORDER — MORPHINE SULFATE 15 MG PO TABS
15.0000 mg | ORAL_TABLET | Freq: Four times a day (QID) | ORAL | 0 refills | Status: DC | PRN
  Filled 2022-08-26: qty 6, 2d supply, fill #0

## 2022-08-26 NOTE — ED Triage Notes (Signed)
Pt tripped and fell pta.  Pt fell on left side, did not hit head. Pt has pain in left ribs.  Ambulatory to the room.  Pt is on elaquis.

## 2022-08-26 NOTE — ED Notes (Signed)
Discharge paperwork given and verbally understood. 

## 2022-08-26 NOTE — ED Provider Notes (Signed)
[place] EMERGENCY DEPARTMENT AT [facility] Provider Note   CSN: [id number] Arrival date & time: 08/26/22  0857     History  Chief Complaint  Patient presents with   Valerie Love is a 66 y.o. female.  Patient with a PMHx of afib RVR (s/p ablation 08/11/22), NSTEMI, HLD, and chronic back pain who presents to the ED for a fall.   Yesterday at 1500, the patient tripped walking up a step and subsequently suffered a mechanical fall. She suffer impact to her left-lateral rib cage, then to her L hip, L knee, and L foot. Denies hitting head or LOC. She is on 5 mg Eliquis daily. Patient is now experiencing left sided rib pain that is worse with deep inspiration. Additionally, she reports diffuse left leg pain with the knee being the most tender and decreased strength of the LLE. States that weakness is attribute to favoring extremity 2/2 pain. Pain worse with movement however she is able to get up and ambulate without assistance.   Denies numbness, tingling, SOB, numbness, paresthesias, bowel/bladder incontinence, vision changes, speech chanced, HA, abdominal pain, hematuria, or decrease UOP.        Home Medications Prior to Admission medications   Medication Sig Start Date End Date Taking? Authorizing Provider  acetaminophen (TYLENOL) 650 MG CR tablet Take 650-1,300 mg by mouth See admin instructions. Take 650 in the morning and 1300 mg in the evening    [provider]  albuterol (VENTOLIN HFA) 108 (90 Base) MCG/ACT inhaler Inhale 2 puffs into the lungs every 6 (six) hours as needed for wheezing or shortness of breath. 04/15/21   Panosh, Neta Mends, MD  ALPRAZolam (XANAX) 0.5 MG tablet TAKE 1 TABLET BY MOUTH THREE TIMES A DAY AS NEEDED FOR ANXIETY. MAXIMUM 3 TALBETS PER 24 HOURS 11/24/21   Panosh, Neta Mends, MD  apixaban (ELIQUIS) 5 MG TABS tablet Take 1 tablet (5 mg total) by mouth 2 (two) times daily. 11/16/21   Fenton, Clint R, PA  Calcium Carb-Cholecalciferol  (CALCIUM+D3) 600-800 MG-UNIT TABS Take 1 tablet by mouth daily with breakfast.    [provider]  citalopram (CELEXA) 20 MG tablet Take 1 tablet by mouth once daily 08/22/22   Worthy Rancher B, FNP  diltiazem (CARDIZEM CD) 180 MG 24 hr capsule Take 1 capsule (180 mg total) by mouth daily. 02/22/22   Runell Gess, MD  diltiazem (CARDIZEM) 30 MG tablet Take one tablet by mouth every 4 hours as needed for HR greater than 100, top number B/P needs to be above 100 02/28/22   Fenton, Clint R, PA  diphenhydrAMINE (BENADRYL) 25 MG tablet Take 25 mg by mouth at bedtime as needed for sleep or itching.    [provider]  Multiple Vitamins-Minerals (ALIVE ONCE DAILY WOMENS 50+) TABS Take 1 tablet by mouth daily.    [provider]  Probiotic Product (TRUBIOTICS) CAPS Take 1 capsule by mouth daily.    [provider]  rosuvastatin (CRESTOR) 5 MG tablet Take 1 tablet (5 mg total) by mouth daily. 03/09/22   Camnitz, Andree Coss, MD      Allergies    Codeine and Lactose intolerance (gi)    Review of Systems   Review of Systems  Physical Exam Updated Vital Signs BP (!) 146/66 (BP Location: Left Arm)   Pulse 66   Temp 98.4 F (36.9 C) (Oral)   Resp 18   SpO2 99%   Physical Exam Vitals and nursing  note reviewed.  Constitutional:      Appearance: She is well-developed.  HENT:     Head: Normocephalic and atraumatic. No raccoon eyes or Battle's sign.     Right Ear: External ear normal.     Left Ear: External ear normal.     Nose: Nose normal.     Mouth/Throat:     Pharynx: Uvula midline.  Eyes:     General: Lids are normal.     Extraocular Movements:     Right eye: No nystagmus.     Left eye: No nystagmus.     Conjunctiva/sclera: Conjunctivae normal.     Pupils: Pupils are equal, round, and reactive to light.     Comments: No visible hyphema noted  Cardiovascular:     Rate and Rhythm: Normal rate and regular rhythm.  Pulmonary:     Effort: Pulmonary  effort is normal.     Breath sounds: Normal breath sounds.  Chest:       Comments: Patient with tenderness to the left inferior lateral chest wall without significant ecchymosis or deformities.  Tenderness is localized. Abdominal:     Palpations: Abdomen is soft.     Tenderness: There is no abdominal tenderness. There is no guarding or rebound.  Musculoskeletal:     Right shoulder: No tenderness. Normal range of motion.     Left shoulder: No tenderness. Normal range of motion.     Left upper arm: No tenderness.     Left elbow: No deformity. Normal range of motion.     Left forearm: No deformity or tenderness.     Left wrist: No tenderness. Normal range of motion.     Cervical back: Normal range of motion and neck supple. No tenderness or bony tenderness.     Thoracic back: No tenderness or bony tenderness.     Lumbar back: No tenderness or bony tenderness.     Right hip: No deformity, tenderness or bony tenderness. Normal range of motion.     Left hip: Tenderness present. No deformity or bony tenderness. Normal range of motion.     Left upper leg: No tenderness.     Left knee: Normal range of motion. Tenderness present. No lateral joint line tenderness.     Left lower leg: Tenderness (over proximal fibula) present.     Left ankle: Ecchymosis (Very small abrasion noted at the junction between the foot and the anterior ankle) present. No tenderness. Normal range of motion.     Left foot: Swelling, tenderness (3rd toe, distal ecchymosis) and bony tenderness present.  Skin:    General: Skin is warm and dry.  Neurological:     Mental Status: She is alert and oriented to person, place, and time.     GCS: GCS eye subscore is 4. GCS verbal subscore is 5. GCS motor subscore is 6.     Cranial Nerves: No cranial nerve deficit.     Sensory: No sensory deficit.     Coordination: Coordination normal.     ED Results / Procedures / Treatments   Labs (all labs ordered are listed, but only  abnormal results are displayed) Labs Reviewed - No data to display  EKG None  Radiology DG Knee Complete 4 Views Left  Addendum Date: 08/26/2022   ADDENDUM REPORT: 08/26/2022 11:57 ADDENDUM: Upon further review, the patient has pain specifically at the level of the fibular head where there is a subtle nondisplaced fibular proximal metaphyseal fracture. The findings were discussed with the ordering service  on the morning of 08/26/2022. Electronically Signed   By: Karen Kays M.D.   On: 08/26/2022 11:57   Result Date: 08/26/2022 CLINICAL DATA:  Pain after fall EXAM: LEFT KNEE - COMPLETE 4 VIEW COMPARISON:  None Available. FINDINGS: No fracture or dislocation. Preserved joint spaces and bone mineralization tiny osteophytes of all 3 compartments. Trace joint effusion. IMPRESSION: Minimal degenerative changes.  Trace joint effusion Electronically Signed: By: Karen Kays M.D. On: 08/26/2022 10:48   DG Hip Unilat W or Wo Pelvis 2-3 Views Left  Result Date: 08/26/2022 CLINICAL DATA:  Pain after fall EXAM: DG HIP (WITH OR WITHOUT PELVIS) 3V LEFT COMPARISON:  None Available. FINDINGS: No fracture or dislocation. Preserved joint spaces and bone mineralization. Degenerative changes seen of the lumbar spine at the edge of the imaging field. There are some vascular calcifications in the pelvis. IMPRESSION: No acute osseous abnormality Electronically Signed   By: Karen Kays M.D.   On: 08/26/2022 10:49   DG Ribs Unilateral W/Chest Left  Result Date: 08/26/2022 CLINICAL DATA:  Pain after fall EXAM: LEFT RIBS AND CHEST - 3 VIEW COMPARISON:  Chest x-ray 11/09/2021 and older FINDINGS: No consolidation, pneumothorax or effusion. No edema. Normal cardiopericardial silhouette. Calcified and tortuous aorta. No left-sided rib fractures identified. IMPRESSION: No left-sided rib fracture identified.  No pneumothorax Electronically Signed   By: Karen Kays M.D.   On: 08/26/2022 10:47    Procedures Procedures     Medications Ordered in ED Medications  lidocaine (LIDODERM) 5 % 1 patch (1 patch Transdermal Patch Applied 08/26/22 1107)    ED Course/ Medical Decision Making/ A&P    Patient seen and examined. History obtained directly from patient.   Labs/EKG: None ordered  Imaging: X-ray of the left chest and ribs, x-ray of the left hip and pelvis, x-ray of the left knee.  I suspect patient could have a broken toe, however will defer imaging as this is minimal.  Medications/Fluids: Lidoderm patch  Most recent vital signs reviewed and are as follows: BP 139/76 (BP Location: Right Arm)   Pulse 61   Temp 98.4 F (36.9 C) (Oral)   Resp 16   SpO2 100%   Initial impression: Mechanical fall without loss of consciousness.  Patient is on a blood thinner.  No head or neck injuries suspected and no decompensation in the previous 24 hours.    Reassessment performed. Patient appears stable.  Imaging personally visualized and interpreted including: X-ray of the chest and ribs, agree no obvious fractures or pulmonary contusion.  X-ray of the hip and pelvis, agree no obvious pelvic fractures or hip fracture.  X-ray of the knee was reviewed personally.  I was concerned for a proximal fibular injury after initial read and I discussed this with radiologist who concurred.  Patient does not have evidence of peroneal or nerve injury on exam.  Reviewed pertinent lab work and imaging with patient at bedside. Questions answered.   Most current vital signs reviewed and are as follows: BP 139/76 (BP Location: Right Arm)   Pulse 61   Temp 98.4 F (36.9 C) (Oral)   Resp 16   SpO2 100%   Plan: Discharge to home.   For rib contusion, will be given pain control and incentive spirometer.  Patient elects to try to use Tylenol.  I provided # 5 tablets of 15 mg morphine IR to use for more severe pain.  Patient has an intolerance (nausea and vomiting) to codeine including hydrocodone and oxycodone.  She is  on an  antidepressant and I do not feel that tramadol will be a good choice in general.  She will monitor closely for intolerances and other symptoms related to pain medication if she elects to take this.  For proximal fibular fracture, patient will be placed in a knee immobilizer and given crutches.  Orthopedic referral given.  Pain control as above.  For toe contusion/possible fracture, buddy tape if needed, otherwise pain control, RICE protocol.  Prescriptions written for: Morphine IR  Other home care instructions discussed: Rest, use of incentive spirometer  ED return instructions discussed: New or worsening symptoms, trouble breathing, worsening shortness of breath.  Follow-up instructions discussed: Patient encouraged to follow-up with their orthopedist in 5 days.                            Medical Decision Making Amount and/or Complexity of Data Reviewed Radiology: ordered.  Risk Prescription drug management.   Patient on anticoagulation with a mechanical sounding fall when she got her sandal caught on the step causing her to fall.  Incident occurred yesterday.  She has had no neurologic decompensation over the past 24 hours, no evidence of head or neck injury, and I do not suspect intracranial injury at this time based on her exam and history.  She did contused her left ribs without signs of fracture.  No clinical signs or signs on x-ray of pulmonary contusion or pneumothorax.  This seems to be mostly a pain control issue at this time.  No shortness of breath.  She did fall onto her hip and no signs of fracture or dislocation on x-ray.  She does have a proximal fibular fracture, but has been bearing weight.  Patient was placed in an immobilizer and given crutches here.  Also suspect possible toe fracture however this is minor.  Patient is overall doing well.  No indication for advanced imaging at this time.  Low concern for significant blood loss or active bleeding.  Do not feel that she  needs evaluation by trauma at the current time.   The patient's vital signs, pertinent lab work and imaging were reviewed and interpreted as discussed in the ED course. Hospitalization was considered for further testing, treatments, or serial exams/observation. However as patient is well-appearing, has a stable exam, and reassuring studies today, I do not feel that they warrant admission at this time. This plan was discussed with the patient who verbalizes agreement and comfort with this plan and seems reliable and able to return to the Emergency Department with worsening or changing symptoms.          Final Clinical Impression(s) / ED Diagnoses Final diagnoses:  Contusion of rib on left side, initial encounter  Contusion of lesser toe of left foot without damage to nail, initial encounter  Fall, initial encounter  Closed fracture of proximal end of left fibula, unspecified fracture morphology, initial encounter    Rx / DC Orders ED Discharge Orders          Ordered    Incentive spirometry RT        08/26/22 1051    morphine (MSIR) 15 MG tablet  Every 6 hours PRN        08/26/22 1228              Renne Crigler, PA-C 08/26/22 1336    Arby Barrette, MD 08/26/22 1355

## 2022-08-26 NOTE — Discharge Instructions (Signed)
Please read and follow all provided instructions.  Your diagnoses today include:  1. Contusion of rib on left side, initial encounter   2. Contusion of lesser toe of left foot without damage to nail, initial encounter   3. Fall, initial encounter   4. Closed fracture of proximal end of left fibula, unspecified fracture morphology, initial encounter     Tests performed today include: X-ray of your chest did not show any signs of rib fractures or injury to the lungs X-ray of the hips and pelvis: No fracture or dislocation X-ray of the knee: Shows proximal fibula fracture Vital signs. See below for your results today.   Medications prescribed:  Morphine tablets: Use in case of severe pain  Use at the lowest dose.  Do not take if it causes similar allergic reaction as codeine.  Continue to use Tylenol as well.  You may take up to 1000 mg every 6-8 hours.  Take any prescribed medications only as directed.  Home care instructions:  Follow any educational materials contained in this packet.  Follow-up instructions: Please follow-up with the orthopedist listed in the next 1 week.  Return instructions:  Please return to the Emergency Department if you experience worsening symptoms.  Please return if you have any other emergent concerns.  Additional Information:  Your vital signs today were: BP 139/76 (BP Location: Right Arm)   Pulse 61   Temp 98.4 F (36.9 C) (Oral)   Resp 16   SpO2 100%  If your blood pressure (BP) was elevated above 135/85 this visit, please have this repeated by your doctor within one month. --------------

## 2022-08-28 ENCOUNTER — Encounter: Payer: Self-pay | Admitting: Cardiology

## 2022-08-29 ENCOUNTER — Telehealth: Payer: Self-pay | Admitting: Internal Medicine

## 2022-08-29 NOTE — Telephone Encounter (Signed)
Requesting a TPC from Panosh to [employer].

## 2022-08-30 ENCOUNTER — Ambulatory Visit: Payer: PPO

## 2022-08-30 NOTE — Telephone Encounter (Signed)
Ok with me if ok with Dr Panosh 

## 2022-08-31 ENCOUNTER — Telehealth: Payer: Self-pay

## 2022-08-31 NOTE — Telephone Encounter (Signed)
        Patient  visited [facility] on 08/26/2022  for fall, contusion of rib on left side.   Telephone encounter attempt :  1st  A HIPAA compliant voice message was left requesting a return call.  Instructed patient to call back at [phone].   Gerold Sar [facility]  [facility] Population Health Community Resource Care Guide   [email or web address]  ?? [phone]   Website: [email or web address]  [email or web address]

## 2022-09-01 ENCOUNTER — Encounter: Payer: Self-pay | Admitting: Orthopedic Surgery

## 2022-09-01 ENCOUNTER — Ambulatory Visit (INDEPENDENT_AMBULATORY_CARE_PROVIDER_SITE_OTHER): Payer: PPO | Admitting: Orthopedic Surgery

## 2022-09-01 ENCOUNTER — Telehealth: Payer: Self-pay

## 2022-09-01 DIAGNOSIS — S82812A Torus fracture of upper end of left fibula, initial encounter for closed fracture: Secondary | ICD-10-CM

## 2022-09-01 NOTE — Progress Notes (Signed)
Office Visit Note   Patient: Valerie Love           Date of Birth: July 09, 1955           MRN: [id number] Visit Date: 09/01/2022              Requested by: Madelin Headings, MD [address] [place],  [place] [address] PCP: Madelin Headings, MD  Chief Complaint  Patient presents with   Left Knee - Fracture    Left fibula fracture, due to falling on 08/26/22, went to ED.      HPI: Patient is a 67 year old woman  Assessment & Plan: Visit Diagnoses:  1. Closed torus fracture of proximal end of left fibula, initial encounter     Plan: Patient will discontinue the knee immobilizer use the crutches as needed.  She is on Eliquis recommended using Tylenol instead of anti-inflammatories.  Follow-Up Instructions: No follow-ups on file.  Who felt on uneven terrain outside was evaluated in the emergency room with a proximal fibular fracture she was placed in a knee immobilizer and given crutches. Ortho Exam  Patient is alert, oriented, no adenopathy, well-dressed, normal affect, normal respiratory effort. Examination patient has some swelling and bruising around the proximal fibula this is tender to palpation.  Radiographs shows a nondisplaced proximal fibular fracture.  Fracture is closed collaterals and cruciates are stable  Imaging: No results found. No images are attached to the encounter.  Labs: Lab Results  Component Value Date   HGBA1C 5.6 06/22/2021   HGBA1C 5.5 08/28/2020   HGBA1C 5.6 06/10/2019   ESRSEDRATE 10 06/22/2021   ESRSEDRATE 17 04/18/2016   ESRSEDRATE 9 08/18/2010   CRP <1.0 06/22/2021   CRP 0.8 04/18/2016   GRAMSTAIN No WBC Seen 04/18/2016   GRAMSTAIN No Squamous Epithelial Cells Seen 04/18/2016   GRAMSTAIN No Organisms Seen 04/18/2016   LABORGA Few GROUP B STREP (S.AGALACTIAE) ISOLATED 04/18/2016     Lab Results  Component Value Date   ALBUMIN 4.2 06/22/2021   ALBUMIN 3.9 08/28/2020   ALBUMIN 4.2 08/08/2019    Lab Results  Component  Value Date   MG 1.9 11/09/2021   MG 2.3 11/12/2018   No results found for: "VD25OH"  No results found for: "PREALBUMIN"    Latest Ref Rng & Units 07/28/2022   10:40 AM 11/09/2021    7:42 PM 06/22/2021   11:47 AM  CBC EXTENDED  WBC 3.4 - 10.8 x10E3/uL 4.9  6.6  5.4   RBC 3.77 - 5.28 x10E6/uL 4.50  4.12  4.54   Hemoglobin 11.1 - 15.9 g/dL 56.2  13.0  86.5   HCT 34.0 - 46.6 % 43.2  38.6  41.2   Platelets 150 - 450 x10E3/uL 258  225  261.0   NEUT# 1.4 - 7.7 K/uL   3.1   Lymph# 0.7 - 4.0 K/uL   1.6      There is no height or weight on file to calculate BMI.  Orders:  No orders of the defined types were placed in this encounter.  No orders of the defined types were placed in this encounter.    Procedures: No procedures performed  Clinical Data: No additional findings.  ROS:  All other systems negative, except as noted in the HPI. Review of Systems  Objective: Vital Signs: There were no vitals taken for this visit.  Specialty Comments:  EXAM: MRI CERVICAL SPINE WITHOUT CONTRAST   TECHNIQUE: Multiplanar, multisequence MR imaging of the cervical spine was  performed. No intravenous contrast was administered.   COMPARISON:  Radiographs of the cervical spine 03/16/2020 (images available, report unavailable).   FINDINGS: Mild intermittent motion degradation.   Alignment: Reversal of the expected cervical lordosis. 2 mm C2-C3 grade 1 anterolisthesis. 2-3 mm C3-C4 grade 1 anterolisthesis.   Vertebrae: Vertebral body height is maintained. Mild degenerative endplate edema at W0-J8, C5-C6, C6-C7 and C7-T1. Marrow edema within the bilateral posterior elements at C2-C3 and on the left at C3-C4, likely degenerative and related to facet arthrosis.   Cord: No signal abnormality identified within the cervical spinal cord   Posterior Fossa, vertebral arteries, paraspinal tissues: No abnormality identified within included portions of the posterior fossa. Flow voids preserved  within the imaged cervical vertebral arteries. Paraspinal soft tissues unremarkable.   Disc levels:   Multilevel disc space narrowing, greatest at C4-C5 (moderate), C5-C6 (moderate/advanced), C6-C7 (moderate/advanced) and C7-T1 (moderate/advanced).   C2-C3: Grade 1 anterolisthesis. Slight disc uncovering. Facet arthrosis on the right. No significant spinal canal or foraminal stenosis.   C3-C4: Grade 1 anterolisthesis. Slight disc uncovering and disc bulge. Uncovertebral hypertrophy on the left. Facet arthrosis (greater on the left and fairly advanced on the left). No significant spinal canal stenosis. Severe left neural foraminal narrowing.   C4-C5: Disc bulge. Endplate spurring with bilateral disc osteophyte ridge/uncinate hypertrophy. A disc bulge effaces the ventral thecal sac, contacting and minimally flattening the ventral spinal cord. However, the dorsal CSF space is maintained within the spinal canal. Bilateral neural foraminal narrowing (mild right, severe left).   C5-C6: Disc bilateral disc osteophyte ridge/uncinate hypertrophy. Mild-to-moderate spinal canal stenosis. The disc bulge contacts and minimally flattens the ventral aspect of the spinal cord. Bilateral neural foraminal narrowing (severe right, mild left).   C6-C7: Disc bulge. Bilateral disc osteophyte ridge/uncinate hypertrophy. Mild spinal canal stenosis (without significant spinal cord mass effect). Severe bilateral neural foraminal narrowing.   C7-T1: Disc bulge. Right-sided disc osteophyte ridge. Facet arthrosis. No significant spinal canal stenosis. Mild right neural foraminal narrowing.   IMPRESSION: Cervical spondylosis, as outlined and with findings most notably as follows.   At C5-C6, there is moderate/advanced disc degeneration. Disc bulge. Bilateral disc osteophyte ridge/uncinate hypertrophy. Mild to moderate spinal canal narrowing. The disc bulge contacts and minimally flattens the ventral  aspect of the spinal cord. Bilateral neural foraminal narrowing (severe right, mild left).   At C4-C5, there is moderate disc degeneration. Disc bulge with bilateral disc osteophyte ridge/uncinate hypertrophy. The disc bulge contacts and minimally flattens the ventral spinal cord. However, the dorsal CSF space is maintained within the spinal canal. Bilateral neural foraminal narrowing (mild right, severe left).   No more than mild spinal canal narrowing at the remaining levels. Additional sites of foraminal stenosis, as detailed and greatest on the left at C3-C4 (severe) and bilaterally at C6-C7 (severe).   Also of note, disc degeneration is moderate/advanced at C6-C7 and C7-T1.   Degenerative marrow edema as described.   Nonspecific reversal of expected cervical lordosis. Grade 1 anterolisthesis at C2-C3 and C3-C4.     Electronically Signed   By: Jackey Loge D.O.   On: 07/15/2021 07:41  PMFS History: Patient Active Problem List   Diagnosis Date Noted   Secondary hypercoagulable state 11/16/2021   Atrial fibrillation with RVR 11/10/2021   Atrial fibrillation with rapid ventricular response 11/09/2021   Acute systolic heart failure 11/01/2018   NSTEMI (non-ST elevated myocardial infarction) 10/30/2018   Hyperlipidemia 06/13/2018   Lactose intolerance 06/13/2018   S/P total knee arthroplasty, right 03/16/2016  Unilateral primary osteoarthritis, right knee    Special screening for malignant neoplasms, colon 12/29/2014   Diarrhea 12/29/2014   Medication management 05/13/2013   Does not have health insurance 05/13/2013   Adjustment reaction with anxiety and depression 03/12/2012   Medication monitoring encounter 03/12/2012   Hemorrhoids 08/13/2011   Perineal itching, female 08/13/2011   LBBB (left bundle branch block) 09/24/2010   Murmur 09/08/2010   EKG, abnormal 08/19/2010   Visit for preventive health examination 08/19/2010   Palpitations 08/18/2010   Dizziness  08/18/2010   Arthritis 08/18/2010   Anxiety state 06/18/2008   GRIEF REACTION 06/18/2008   DEPRESSION, MAJOR, RECURRENT 07/06/2006   ARTHRALGIA, UNSPECIFIED 07/06/2006   Past Medical History:  Diagnosis Date   Anxiety    Arthritis    R knee, back, hands    Cellulitis of knee 04/2016   RT KNEE   Cellulitis of right knee 04/18/2016   Childhood asthma    Complication of anesthesia    woke up slowly - 93yrs. ago   Depression    related to husband illness and death   History of jaundice    as a child     Hx of varicella    Hyperthyroidism    during pregnancy, treated & resolved post partum    IBS (irritable bowel syndrome)    LBBB (left bundle branch block)    Scoliosis    Scoliosis 07/2015   Thyroid disease in pregnancy    TOBACCO DEPENDENCE 07/06/2006   Qualifier: Diagnosis of  By: Knox Royalty     Troponin level elevated     Family History  Problem Relation Age of Onset   Alcohol abuse Mother        died from stomach ulcers   Stroke Father        died  from cva and mi   Hypertension Father    Hyperlipidemia Father    Coronary artery disease Father        Age 39   Colon polyps Sister    Hepatitis C Brother    Colon cancer Neg Hx     Past Surgical History:  Procedure Laterality Date   ATRIAL FIBRILLATION ABLATION N/A 08/11/2022   Procedure: ATRIAL FIBRILLATION ABLATION;  Surgeon: Regan Lemming, MD;  Location: [facility];  Service: Cardiovascular;  Laterality: N/A;   BREAST BIOPSY Right 1979   benign    Cyst removed from ovary     KNEE SURGERY     right Joliyah Lippens    LEFT HEART CATH AND CORONARY ANGIOGRAPHY N/A 10/31/2018   Procedure: LEFT HEART CATH AND CORONARY ANGIOGRAPHY;  Surgeon: Lennette Bihari, MD;  Location: [facility];  Service: Cardiovascular;  Laterality: N/A;   TONSILLECTOMY AND ADENOIDECTOMY  1073   TOTAL KNEE ARTHROPLASTY 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   Procedure: TOTAL KNEE ARTHROPLASTY;  Surgeon: [name], MD;  Location: [facility] OR;  Service:  Orthopedics;  Laterality: Right;   TUBAL LIGATION     WISDOM TOOTH EXTRACTION     Social History   Occupational History   Occupation: self    Comment: Catering and cleaning  Tobacco Use   Smoking status: Former    Packs/day: 0.25    Years: 40.00    Additional pack years: 0.00    Total pack years: 10.00    Types: Cigarettes    Quit date: 01/06/2016    Years since quitting: 6.6   Smokeless tobacco: Never   Tobacco comments:    Former smoker  11/16/21  Substance and Sexual Activity   Alcohol use: Yes    Alcohol/week: 7.0 standard drinks of alcohol    Types: 7 Glasses of wine per week    Comment: 1 glass of wine daily 11/16/21   Drug use: No   Sexual activity: Not on file

## 2022-09-01 NOTE — Telephone Encounter (Signed)
        Patient  visited [facility] on 08/26/2022  for fall.   Telephone encounter attempt :  2nd   Unable to leave message voicemail did not pickup.   [name]  [facility] Community Resource Care Guide   [email or web address]  ?? [phone]   Website: [email or web address]  [email or web address]

## 2022-09-01 NOTE — Telephone Encounter (Signed)
Ok with me 

## 2022-09-01 NOTE — Telephone Encounter (Signed)
Ok you can schedule her in July for a TOC or she can see me sooner if she needs to

## 2022-09-03 ENCOUNTER — Other Ambulatory Visit: Payer: Self-pay | Admitting: Cardiology

## 2022-09-03 DIAGNOSIS — E7800 Pure hypercholesterolemia, unspecified: Secondary | ICD-10-CM

## 2022-09-06 ENCOUNTER — Ambulatory Visit: Payer: PPO

## 2022-09-08 ENCOUNTER — Ambulatory Visit (HOSPITAL_COMMUNITY)
Admission: RE | Admit: 2022-09-08 | Discharge: 2022-09-08 | Disposition: A | Discharge status: Home / Self Care | Payer: PPO | Source: Ambulatory Visit | Attending: Physician Assistant | Admitting: Physician Assistant

## 2022-09-08 ENCOUNTER — Other Ambulatory Visit: Payer: Self-pay | Admitting: Internal Medicine

## 2022-09-08 VITALS — BP 136/66 | HR 59 | Ht 61.0 in | Wt 141.4 lb

## 2022-09-08 DIAGNOSIS — I5022 Chronic systolic (congestive) heart failure: Secondary | ICD-10-CM | POA: Diagnosis not present

## 2022-09-08 DIAGNOSIS — I480 Paroxysmal atrial fibrillation: Secondary | ICD-10-CM

## 2022-09-08 DIAGNOSIS — Z823 Family history of stroke: Secondary | ICD-10-CM | POA: Insufficient documentation

## 2022-09-08 DIAGNOSIS — D6869 Other thrombophilia: Secondary | ICD-10-CM | POA: Diagnosis not present

## 2022-09-08 DIAGNOSIS — Z87891 Personal history of nicotine dependence: Secondary | ICD-10-CM | POA: Insufficient documentation

## 2022-09-08 DIAGNOSIS — I252 Old myocardial infarction: Secondary | ICD-10-CM | POA: Insufficient documentation

## 2022-09-08 DIAGNOSIS — Z8249 Family history of ischemic heart disease and other diseases of the circulatory system: Secondary | ICD-10-CM | POA: Diagnosis not present

## 2022-09-08 DIAGNOSIS — Z1231 Encounter for screening mammogram for malignant neoplasm of breast: Secondary | ICD-10-CM

## 2022-09-08 DIAGNOSIS — E785 Hyperlipidemia, unspecified: Secondary | ICD-10-CM | POA: Diagnosis not present

## 2022-09-08 NOTE — Progress Notes (Signed)
Primary Care Physician: Madelin Headings, MD Primary Cardiologist: Dr Allyson Sabal Primary Electrophysiologist: Dr Elberta Fortis Referring Physician: Dr [place]   Valerie Love is a 67 y.o. female with a history of LV dysfunction, NSTEMI w/ normal coronaries 2020, HLD, atrial fibrillation who presents for follow up in the [facility].  The patient was initially diagnosed with atrial fibrillation 11/09/21 after presenting with symptoms of heart racing, weakness, and jaw discomfort. She went to the fire department down the street where she was found to be in atrial fibrillation with rate as high as 200 and was brought to our ED via EMS.  She was given a bolus of diltiazem and started on an infusion which controlled her rates.  Her acute symptoms resolved with rate control. She spontaneously converted to SR and was discharged on diltiazem and Eliquis for a CHADS2VASC score of 3.   On follow up today, patient is s/p afib ablation with Dr Elberta Fortis on 08/11/22. She has done well from an afib standpoint. She denies chest pain, swallowing pain, or groin issues. She did have a mechanical fall 08/26/22 and fractured her fibula, currently using crutches.   Today, she denies symptoms of palpitations, chest pain, shortness of breath, orthopnea, PND, lower extremity edema, dizziness, presyncope, syncope, snoring, daytime somnolence, bleeding, or neurologic sequela. The patient is tolerating medications without difficulties and is otherwise without complaint today.    Atrial Fibrillation Risk Factors:  she does not have symptoms or diagnosis of sleep apnea. she does not have a history of rheumatic fever. she does have a history of alcohol use. The patient does not have a history of early familial atrial fibrillation or other arrhythmias.  she has a BMI of Body mass index is 26.72 kg/m.Marland Kitchen Filed Weights   09/08/22 1030  Weight: 64.1 kg   Family History  Problem Relation Age of Onset    Alcohol abuse Mother        died from stomach ulcers   Stroke Father        died  from cva and mi   Hypertension Father    Hyperlipidemia Father    Coronary artery disease Father        Age 59   Colon polyps Sister    Hepatitis C Brother    Colon cancer Neg Hx      Atrial Fibrillation Management history:  Previous antiarrhythmic drugs: none Previous cardioversions: none Previous ablations: 08/11/22 CHADS2VASC score: 3 Anticoagulation history: Eliquis   Past Medical History:  Diagnosis Date   Anxiety    Arthritis    R knee, back, hands    Cellulitis of knee 04/2016   RT KNEE   Cellulitis of right knee 04/18/2016   Childhood asthma    Complication of anesthesia    woke up slowly - 26yrs. ago   Depression    related to husband illness and death   History of jaundice    as a child     Hx of varicella    Hyperthyroidism    during pregnancy, treated & resolved post partum    IBS (irritable bowel syndrome)    LBBB (left bundle branch block)    Scoliosis    Scoliosis 07/2015   Thyroid disease in pregnancy    TOBACCO DEPENDENCE 07/06/2006   Qualifier: Diagnosis of  By: Knox Royalty     Troponin level elevated    Past Surgical History:  Procedure Laterality Date   ATRIAL FIBRILLATION ABLATION N/A 08/11/2022  Procedure: ATRIAL FIBRILLATION ABLATION;  Surgeon: Regan Lemming, MD;  [facility]: [facility];  Service: Cardiovascular;  Laterality: N/A;   BREAST BIOPSY Right 1979   benign    Cyst removed from ovary     KNEE SURGERY     right duda    LEFT HEART CATH AND CORONARY ANGIOGRAPHY N/A 10/31/2018   Procedure: LEFT HEART CATH AND CORONARY ANGIOGRAPHY;  Surgeon: Lennette Bihari, MD;  Location: [facility];  Service: Cardiovascular;  Laterality: N/A;   TONSILLECTOMY AND ADENOIDECTOMY  1073   TOTAL KNEE ARTHROPLASTY 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   Procedure: TOTAL KNEE ARTHROPLASTY;  Surgeon: [name], MD;  Location: [facility];  Service: Orthopedics;  Laterality:  Right;   TUBAL LIGATION     WISDOM TOOTH EXTRACTION      Current Outpatient Medications  Medication Sig Dispense Refill   acetaminophen (TYLENOL) 650 MG CR tablet Take 1,000 mg by mouth 2 (two) times daily.     albuterol (VENTOLIN HFA) 108 (90 Base) MCG/ACT inhaler Inhale 2 puffs into the lungs every 6 (six) hours as needed for wheezing or shortness of breath. 8.5 g 1   ALPRAZolam (XANAX) 0.5 MG tablet TAKE 1 TABLET BY MOUTH THREE TIMES A DAY AS NEEDED FOR ANXIETY. MAXIMUM 3 TALBETS PER 24 HOURS 30 tablet 0   apixaban (ELIQUIS) 5 MG TABS tablet Take 1 tablet (5 mg total) by mouth 2 (two) times daily. 180 tablet 3   Calcium Carb-Cholecalciferol (CALCIUM+D3) 600-800 MG-UNIT TABS Take 1 tablet by mouth daily with breakfast.     citalopram (CELEXA) 20 MG tablet Take 1 tablet by mouth once daily 90 tablet 0   diltiazem (CARDIZEM CD) 180 MG 24 hr capsule Take 1 capsule (180 mg total) by mouth daily. 90 capsule 3   diltiazem (CARDIZEM) 30 MG tablet Take one tablet by mouth every 4 hours as needed for HR greater than 100, top number B/P needs to be above 100 45 tablet 1   diphenhydrAMINE (BENADRYL) 25 MG tablet Take 25 mg by mouth at bedtime as needed for sleep or itching.     Multiple Vitamins-Minerals (ALIVE ONCE DAILY WOMENS 50+) TABS Take 1 tablet by mouth daily.     Probiotic Product (TRUBIOTICS) CAPS Take 1 capsule by mouth daily.     rosuvastatin (CRESTOR) 5 MG tablet Take 1 tablet by mouth once daily 90 tablet 0   No current facility-administered medications for this encounter.    Allergies  Allergen Reactions   Codeine Itching, Swelling, Rash and Other (See Comments)    Hives and Vomiting   Lactose Intolerance (Gi) Diarrhea    Social History   Socioeconomic History   Marital status: Widowed    Spouse name: Not on file   Number of children: 3   Years of education: Not on file   Highest education level: Not on file  Occupational History   Occupation: self    Comment: Catering  and cleaning  Tobacco Use   Smoking status: Former    Packs/day: 0.25    Years: 40.00    Additional pack years: 0.00    Total pack years: 10.00    Types: Cigarettes    Quit date: 01/06/2016    Years since quitting: 6.6   Smokeless tobacco: Never   Tobacco comments:    Former smoker 11/16/21  Substance and Sexual Activity   Alcohol use: Yes    Alcohol/week: 7.0 standard drinks of alcohol    Types: 7 Glasses of  wine per week    Comment: 1 glass of wine daily 11/16/21   Drug use: No   Sexual activity: Not on file  Other Topics Concern   Not on file  Social History Narrative   Husband passed away on 17-Feb-2008 hep c and cirrhosis.   She is hep c negative 2006   G3P3   HH of 2-3  son no pets    No falls .  Has smoke detector and wears seat belts. POsitive  firearms. No excess sun exposure.    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   Had time of nono insuranced and cannot affort 800 per month for catastrophic    Stopped tobacco fall 2017 . ocass etoh.  No rec drugs.   Daughter patient in our practice   [name]   Social Determinants of Health   Financial Resource Strain: Low Risk  (08/15/2022)   Overall Financial Resource Strain (CARDIA)    Difficulty of Paying Living Expenses: Not hard at all  Food Insecurity: No Food Insecurity (08/15/2022)   Hunger Vital Sign    Worried About Running Out of Food in the Last Year: Never true    Ran Out of Food in the Last Year: Never true  Transportation Needs: No Transportation Needs (08/15/2022)   PRAPARE - Administrator, Civil Service (Medical): No    Lack of Transportation (Non-Medical): No  Physical Activity: Sufficiently Active (08/15/2022)   Exercise Vital Sign    Days of Exercise per Week: 5 days    Minutes of Exercise per Session: 30 min  Stress: No Stress Concern Present (08/15/2022)   [employer] of Occupational Health - Occupational Stress Questionnaire    Feeling of Stress : Not at all   Social Connections: Moderately Integrated (08/15/2022)   Social Connection and Isolation Panel [NHANES]    Frequency of Communication with Friends and Family: More than three times a week    Frequency of Social Gatherings with Friends and Family: More than three times a week    Attends Religious Services: More than 4 times per year    Active Member of [employer] or Organizations: Yes    Attends Banker Meetings: More than 4 times per year    Marital Status: Widowed  Intimate Partner Violence: Not At Risk (08/15/2022)   Humiliation, Afraid, Rape, and Kick questionnaire    Fear of Current or Ex-Partner: No    Emotionally Abused: No    Physically Abused: No    Sexually Abused: No     ROS- All systems are reviewed and negative except as per the HPI above.  Physical Exam: Vitals:   09/08/22 1030  BP: 136/66  Pulse: (!) 59  Weight: 64.1 kg  Height: 5\' 1"  (1.549 m)     GEN- The patient is a well appearing female, alert and oriented x 3 today.   HEENT-head normocephalic, atraumatic, sclera clear, conjunctiva pink, hearing intact, trachea midline. Lungs- Clear to ausculation bilaterally, normal work of breathing Heart- Regular rate and rhythm, no murmurs, rubs or gallops  GI- soft, NT, ND, + BS Extremities- no clubbing, cyanosis, or edema MS- no significant deformity or atrophy Skin- no rash or lesion Psych- euthymic mood, full affect Neuro- strength and sensation are intact   Wt Readings from Last 3 Encounters:  09/08/22 64.1 kg  08/15/22 63.5 kg  08/11/22 63.5 kg    EKG today demonstrates  SB, LBBB Vent. rate 59 BPM  PR interval 156 ms QRS duration 140 ms QT/QTcB 468/463 ms  Echo 11/10/21 demonstrated   1. Left ventricular ejection fraction, by estimation, is 55 to 60%. The  left ventricle has normal function. The left ventricle has no regional  wall motion abnormalities. Left ventricular diastolic parameters are  consistent with Grade I diastolic dysfunction  (impaired relaxation).   2. Right ventricular systolic function is normal. The right ventricular  size is normal. There is normal pulmonary artery systolic pressure.   3. The mitral valve is normal in structure. Mild mitral valve  regurgitation. No evidence of mitral stenosis.   4. The aortic valve is normal in structure. Aortic valve regurgitation is mild. No aortic stenosis is present.   5. The inferior vena cava is dilated in size with >50% respiratory  variability, suggesting right atrial pressure of 8 mmHg.   Epic records are reviewed at length today  CHA2DS2-VASc Score = 3  The patient's score is based upon: CHF History: 1 (EF recovered) HTN History: 0 Diabetes History: 0 Stroke History: 0 Vascular Disease History: 0 Age Score: 1 Gender Score: 1       ASSESSMENT AND PLAN: 1. Paroxysmal Atrial Fibrillation (ICD10:  I48.0) The patient's CHA2DS2-VASc score is 3, indicating a 3.2% annual risk of stroke.   S/p afib ablation 08/11/22. Patient appears to be maintaining SR.  Continue Eliquis 5 mg BID Continue diltiazem 180 mg daily Continue diltiazem 30 mg PRN q 4 hours for heart racing.   2. Secondary Hypercoagulable State (ICD10:  D68.69) The patient is at significant risk for stroke/thromboembolism based upon her CHA2DS2-VASc Score of 3.  Continue Apixaban (Eliquis).   3. 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   Follow up with Dr Elberta Fortis as scheduled.    Jorja Loa PA-C [facility] [address] [place], [place] [address] [phone] 09/08/2022 10:49 AM

## 2022-10-11 ENCOUNTER — Ambulatory Visit
Admission: RE | Admit: 2022-10-11 | Discharge: 2022-10-11 | Disposition: A | Discharge status: Home / Self Care | Payer: PPO | Source: Ambulatory Visit | Attending: Internal Medicine | Admitting: Internal Medicine

## 2022-10-11 DIAGNOSIS — Z1231 Encounter for screening mammogram for malignant neoplasm of breast: Secondary | ICD-10-CM

## 2022-10-25 ENCOUNTER — Encounter: Payer: Self-pay | Admitting: Family Medicine

## 2022-10-25 ENCOUNTER — Other Ambulatory Visit (HOSPITAL_COMMUNITY): Payer: Self-pay

## 2022-10-25 ENCOUNTER — Other Ambulatory Visit: Payer: Self-pay | Admitting: Internal Medicine

## 2022-10-25 ENCOUNTER — Ambulatory Visit (INDEPENDENT_AMBULATORY_CARE_PROVIDER_SITE_OTHER): Payer: PPO | Admitting: Family Medicine

## 2022-10-25 VITALS — BP 140/70 | HR 66 | Temp 97.6°F | Ht 61.0 in | Wt 146.4 lb

## 2022-10-25 DIAGNOSIS — K591 Functional diarrhea: Secondary | ICD-10-CM

## 2022-10-25 DIAGNOSIS — J4520 Mild intermittent asthma, uncomplicated: Secondary | ICD-10-CM

## 2022-10-25 DIAGNOSIS — E782 Mixed hyperlipidemia: Secondary | ICD-10-CM

## 2022-10-25 DIAGNOSIS — J45909 Unspecified asthma, uncomplicated: Secondary | ICD-10-CM | POA: Insufficient documentation

## 2022-10-25 LAB — LIPID PANEL
Cholesterol: 157 mg/dL (ref 0–200)
HDL: 74.5 mg/dL (ref >39.00)
LDL Cholesterol: 64 mg/dL (ref 0–99)
NonHDL: 82
Total CHOL/HDL Ratio: 2
Triglycerides: 90 mg/dL (ref 0.0–149.0)
VLDL: 18 mg/dL (ref 0.0–40.0)

## 2022-10-25 LAB — HEPATIC FUNCTION PANEL
ALT: 30 U/L (ref 0–35)
AST: 33 U/L (ref 0–37)
Albumin: 4.1 g/dL (ref 3.5–5.2)
Alkaline Phosphatase: 67 U/L (ref 39–117)
Bilirubin, Direct: 0.1 mg/dL (ref 0.0–0.3)
Total Bilirubin: 0.4 mg/dL (ref 0.2–1.2)
Total Protein: 6.9 g/dL (ref 6.0–8.3)

## 2022-10-25 LAB — TSH: TSH: 0.51 u[IU]/mL (ref 0.35–5.50)

## 2022-10-25 MED ORDER — ALBUTEROL SULFATE HFA 108 (90 BASE) MCG/ACT IN AERS
2.0000 | INHALATION_SPRAY | Freq: Four times a day (QID) | RESPIRATORY_TRACT | 1 refills | Status: DC | PRN
Start: 2022-10-25 — End: 2022-10-27
  Filled 2022-10-25: qty 6.7, 25d supply, fill #0

## 2022-10-25 NOTE — Progress Notes (Signed)
Established Patient Office Visit  Subjective   Patient ID: Valerie Love, female    DOB: December 07, 1955  Age: 67 y.o. MRN: [id number]  Chief Complaint  Patient presents with   Transitions Of Care    Patient is here for [facility] visit. She saw Dr. Fabian Sharp last year. States that she had a cardiac ablation 2 months ago for her a fib, states that her HR is doing well, going back to the cardiologist  Patient reports chronic diarrhea, states that she is lactose intolerant, but reports no matter what she eats her BM's are watery. Has been going on for the last month-- going 4-5 times per day, starts off normal then progresses to watery. This has happened to her in the past as well, she assumed she had IBS and was managing the symptoms at home fairly well until recently  States that she has tried to take out certain foods recently to see what is causing but hasn't figured it out yet. States that she is already on probiotics once a day everyday. She had a colonoscopy 7 years ago, was tested at that time for lactose intolerance -- she had gone on a dairy free diet and her BM's had improved. States that she has had a lot of stress over the last couple of months. She denies any blood in the stool, no abdominal cramping, nausea or vomiting, no fever or chills.     Current Outpatient Medications  Medication Instructions   acetaminophen (TYLENOL) 1,000 mg, Oral, 2 times daily   albuterol (VENTOLIN HFA) 108 (90 Base) MCG/ACT inhaler 2 puffs, Inhalation, Every 6 hours PRN   ALPRAZolam (XANAX) 0.5 MG tablet TAKE 1 TABLET BY MOUTH THREE TIMES A DAY AS NEEDED FOR ANXIETY. MAXIMUM 3 TALBETS PER 24 HOURS   apixaban (ELIQUIS) 5 mg, Oral, 2 times daily   Calcium Carb-Cholecalciferol (CALCIUM+D3) 600-800 MG-UNIT TABS 1 tablet, Oral, Daily with breakfast   citalopram (CELEXA) 20 mg, Oral, Daily   diltiazem (CARDIZEM CD) 180 mg, Oral, Daily   diltiazem (CARDIZEM) 30 MG tablet Take one tablet by mouth every 4 hours as  needed for HR greater than 100, top number B/P needs to be above 100   diphenhydrAMINE (BENADRYL) 25 mg, Oral, At bedtime PRN   Multiple Vitamins-Minerals (ALIVE ONCE DAILY WOMENS 50+) TABS 1 tablet, Oral, Daily   Probiotic Product (TRUBIOTICS) CAPS 1 capsule, Oral, Daily   rosuvastatin (CRESTOR) 5 mg, Oral, Daily    Patient Active Problem List   Diagnosis Date Noted   Asthma in adult 10/25/2022   Hypercoagulable state due to paroxysmal atrial fibrillation (HCC) 11/16/2021   Atrial fibrillation with RVR (HCC) 11/10/2021   Atrial fibrillation with rapid ventricular response (HCC) 11/09/2021   Acute systolic heart failure (HCC) 11/01/2018   NSTEMI (non-ST elevated myocardial infarction) (HCC) 10/30/2018   Hyperlipidemia 06/13/2018   Lactose intolerance 06/13/2018   S/P total knee arthroplasty, right 03/16/2016   Unilateral primary osteoarthritis, right knee    Special screening for malignant neoplasms, colon 12/29/2014   Diarrhea 12/29/2014   Medication management 05/13/2013   Does not have health insurance 05/13/2013   Adjustment reaction with anxiety and depression 03/12/2012   Medication monitoring encounter 03/12/2012   Hemorrhoids 08/13/2011   Perineal itching, female 08/13/2011   LBBB (left bundle branch block) 09/24/2010   Murmur 09/08/2010   EKG, abnormal 08/19/2010   Visit for preventive health examination 08/19/2010   Palpitations 08/18/2010   Dizziness 08/18/2010   Arthritis 08/18/2010   Anxiety state  06/18/2008   GRIEF REACTION 06/18/2008   DEPRESSION, MAJOR, RECURRENT 07/06/2006   ARTHRALGIA, UNSPECIFIED 07/06/2006      Review of Systems  All other systems reviewed and are negative.     Objective:     BP (!) 140/70 (BP Location: Left Arm, Patient Position: Sitting, Cuff Size: Normal)   Pulse 66   Temp 97.6 F (36.4 C) (Oral)   Ht 5\' 1"  (1.549 m)   Wt 146 lb 6.4 oz (66.4 kg)   SpO2 98%   BMI 27.66 kg/m    Physical Exam Vitals reviewed.   Constitutional:      Appearance: Normal appearance. She is well-groomed and normal weight.  Eyes:     Conjunctiva/sclera: Conjunctivae normal.  Neck:     Thyroid: No thyromegaly.  Cardiovascular:     Rate and Rhythm: Normal rate and regular rhythm.     Pulses: Normal pulses.     Heart sounds: S1 normal and S2 normal.  Pulmonary:     Effort: Pulmonary effort is normal.     Breath sounds: Normal breath sounds and air entry.  Abdominal:     General: Bowel sounds are normal.  Musculoskeletal:     Right lower leg: No edema.     Left lower leg: No edema.  Neurological:     Mental Status: She is alert and oriented to person, place, and time. Mental status is at baseline.     Gait: Gait is intact.  Psychiatric:        Mood and Affect: Mood and affect normal.        Speech: Speech normal.        Behavior: Behavior normal.        Judgment: Judgment normal.      No results found for any visits on 10/25/22.    The ASCVD Risk score (Arnett DK, et al., 2019) failed to calculate for the following reasons:   The patient has a prior MI or stroke diagnosis    Assessment & Plan:  Functional diarrhea Assessment & Plan: Unclear etiology, has been going on for years intermittently, worsened this last month, no correlation to her diet that she can determine. We discussed further work up, will order celiac panel to rule out gluten allergy. Checking TSH to rule out hyperthyroidism.  I recommended increasing fiber in her diet and starting PRN imodium 2 mg tablets OTC, instructions for use were written out for the patient. If she continues to have symptoms then we can refer back to GI.  Orders: -     Celiac panel 10 -     TSH  Mild intermittent asthma in adult without complication Assessment & Plan: Lung clear on exam, pt is a former smoker, will refill her albuterol inhaler to be used as needed  Orders: -     Albuterol Sulfate HFA; Inhale 2 puffs into the lungs every 6 (six) hours as  needed for wheezing or shortness of breath.  Dispense: 6.7 g; Refill: 1  Mixed hyperlipidemia Assessment & Plan: On crestor 5 mg daily, she needs a new lipid panel today, hepatic function panel for medication surveillance   Orders: -     Hepatic function panel -     Lipid panel   I spent 30 minutes with patient discussing her symptoms, deciding on tests, and management.   Return in about 1 year (around 10/25/2023) for annual physical exam.    Karie Georges, MD

## 2022-10-25 NOTE — Patient Instructions (Signed)
Imodium 1 tablet after each loose stool-- may take up to 8 tablets per day if needed  Adding more fiber to the diet will help bulk up the stool

## 2022-10-25 NOTE — Assessment & Plan Note (Signed)
Lung clear on exam, pt is a former smoker, will refill her albuterol inhaler to be used as needed

## 2022-10-25 NOTE — Assessment & Plan Note (Addendum)
Unclear etiology, has been going on for years intermittently, worsened this last month, no correlation to her diet that she can determine. We discussed further work up, will order celiac panel to rule out gluten allergy. Checking TSH to rule out hyperthyroidism.  I recommended increasing fiber in her diet and starting PRN imodium 2 mg tablets OTC, instructions for use were written out for the patient. If she continues to have symptoms then we can refer back to GI.

## 2022-10-25 NOTE — Assessment & Plan Note (Addendum)
On crestor 5 mg daily, she needs a new lipid panel today, hepatic function panel for medication surveillance

## 2022-10-27 ENCOUNTER — Telehealth: Payer: Self-pay | Admitting: Family Medicine

## 2022-10-27 DIAGNOSIS — J4520 Mild intermittent asthma, uncomplicated: Secondary | ICD-10-CM

## 2022-10-27 MED ORDER — ALBUTEROL SULFATE HFA 108 (90 BASE) MCG/ACT IN AERS
2.0000 | INHALATION_SPRAY | Freq: Four times a day (QID) | RESPIRATORY_TRACT | 1 refills | Status: DC | PRN
Start: 2022-10-27 — End: 2023-03-10

## 2022-10-27 NOTE — Telephone Encounter (Signed)
Pt called to say her Rx for the albuterol was sent to the wrong pharmacy  albuterol (VENTOLIN HFA) 108 (90 Base) MCG/ACT inhaler   Please send to: [facility] [address] [place], [place] - [address] Phone: [phone]  Fax: [phone]

## 2022-10-27 NOTE — Telephone Encounter (Signed)
Rx sent 

## 2022-10-30 LAB — CELIAC PANEL 10
Antigliadin Abs, IgA: 5 units (ref 0–19)
Endomysial IgA: NEGATIVE
Gliadin IgG: 3 units (ref 0–19)
IgA/Immunoglobulin A, Serum: 258 mg/dL (ref 87–352)
Tissue Transglut Ab: <2 U/mL (ref 0–5)
Transglutaminase IgA: <2 U/mL (ref 0–3)

## 2022-11-07 ENCOUNTER — Other Ambulatory Visit: Payer: Self-pay | Admitting: Family

## 2022-11-14 NOTE — Telephone Encounter (Signed)
Pt is requesting Citalopram refill -- she states she called in last Tuesday, 11/08/22 for this refill.  Pt has only 2 days left.

## 2022-11-17 ENCOUNTER — Other Ambulatory Visit: Payer: Self-pay | Admitting: Family

## 2022-11-23 ENCOUNTER — Ambulatory Visit: Payer: PPO | Admitting: Cardiology

## 2022-11-24 ENCOUNTER — Other Ambulatory Visit: Payer: Self-pay | Admitting: *Deleted

## 2022-11-24 NOTE — Telephone Encounter (Signed)
Spoke with Valerie Love,  she stated the request was sent in error from an older Rx for Citalopram 20mg  and the patient picked up the Rx.

## 2022-11-24 NOTE — Telephone Encounter (Signed)
Looks like I already sent this a week ago, did the patient call the pharmacy to check?

## 2022-11-28 ENCOUNTER — Other Ambulatory Visit: Payer: Self-pay | Admitting: Cardiology

## 2022-11-28 ENCOUNTER — Other Ambulatory Visit: Payer: Self-pay | Admitting: *Deleted

## 2022-11-28 DIAGNOSIS — E7800 Pure hypercholesterolemia, unspecified: Secondary | ICD-10-CM

## 2022-11-28 NOTE — Telephone Encounter (Signed)
Error

## 2022-12-02 ENCOUNTER — Ambulatory Visit: Payer: PPO | Attending: Cardiology | Admitting: Cardiology

## 2022-12-02 ENCOUNTER — Encounter: Payer: Self-pay | Admitting: Cardiology

## 2022-12-02 VITALS — BP 140/82 | HR 60 | Ht 61.0 in | Wt 144.4 lb

## 2022-12-02 DIAGNOSIS — I447 Left bundle-branch block, unspecified: Secondary | ICD-10-CM | POA: Diagnosis not present

## 2022-12-02 DIAGNOSIS — D6869 Other thrombophilia: Secondary | ICD-10-CM

## 2022-12-02 DIAGNOSIS — I480 Paroxysmal atrial fibrillation: Secondary | ICD-10-CM | POA: Diagnosis not present

## 2022-12-02 NOTE — Progress Notes (Signed)
Electrophysiology Office Note:   Date:  12/02/2022  ID:  Valerie, Love 1955-06-04, MRN [id number]  Primary Cardiologist: Nanetta Batty, MD Electrophysiologist: Regan Lemming, MD      History of Present Illness:   Valerie Love is a 67 y.o. female with h/o fibrillation seen today for routine electrophysiology followup.  Since last being seen in our clinic the patient reports doing well.  She has had no further episodes of atrial fibrillation.  She has been able to do all of her daily activities without restriction.  She continues to exercise and be quite active.  She did have a fall a week after her ablation and fractured her right fibula.  She tripped and did not have any dizziness.  she denies chest pain, palpitations, dyspnea, PND, orthopnea, nausea, vomiting, dizziness, syncope, edema, weight gain, or early satiety.     She has a history significant for LV systolic dysfunction, coronary artery disease post non-STEMI with normal coronary arteries in 2020, hyperlipidemia, atrial fibrillation.  She was diagnosed with atrial fibrillation 11/09/2021.  She is now post ablation 08/11/2022.    Review of systems complete and found to be negative unless listed in HPI.   EP Information / Studies Reviewed:    EKG is ordered today. Personal review as below.  EKG Interpretation Date/Time:  Friday December 02 2022 09:15:32 EDT Ventricular Rate:  60 PR Interval:  160 QRS Duration:  136 QT Interval:  482 QTC Calculation: 482 R Axis:   78  Text Interpretation: Normal sinus rhythm Left bundle branch block When compared with ECG of 08-Sep-2022 10:42, No significant change was found Confirmed by Breylan Lefevers ([id number]) on 12/02/2022 9:18:43 AM     Risk Assessment/Calculations:    CHA2DS2-VASc Score = 2   This indicates a 2.2% annual risk of stroke. The patient's score is based upon: CHF History: 0 HTN History: 0 Diabetes History: 0 Stroke History: 0 Vascular Disease History: 0 Age  Score: 1 Gender Score: 1    Physical Exam:   VS:  BP (!) 140/82 (BP Location: Left Arm, Patient Position: Sitting, Cuff Size: Normal)   Pulse 60   Ht 5\' 1"  (1.549 m)   Wt 144 lb 6.4 oz (65.5 kg)   SpO2 98%   BMI 27.28 kg/m    Wt Readings from Last 3 Encounters:  12/02/22 144 lb 6.4 oz (65.5 kg)  10/25/22 146 lb 6.4 oz (66.4 kg)  09/08/22 141 lb 6.4 oz (64.1 kg)     GEN: Well nourished, well developed in no acute distress NECK: No JVD; No carotid bruits CARDIAC: Regular rate and rhythm, no murmurs, rubs, gallops RESPIRATORY:  Clear to auscultation without rales, wheezing or rhonchi  ABDOMEN: Soft, non-tender, non-distended EXTREMITIES:  No edema; No deformity   ASSESSMENT AND PLAN:    1.  Paroxysmal atrial fibrillation: Currently on Eliquis and diltiazem.  Is status post ablation 08/11/2022.  She is fortunately had no further episodes of atrial fibrillation.  She feels well and without complaint.  We discussed stopping her diltiazem and Eliquis, but for now she would like to continue these.  In 6 months, if she is not having any further episodes of atrial fibrillation, may decide to stop both of these medications.  2.  Secondary hypercoagulable state: Currently on Eliquis for atrial fibrillation  3.  Chronic systolic heart failure with reduced ejection fraction: Ejection fraction has recovered.    Follow up with [facility] in 6 months  Signed, Rylynne Schicker Jorja Loa, MD

## 2022-12-02 NOTE — Patient Instructions (Signed)
Medication Instructions:  Your physician recommends that you continue on your current medications as directed. Please refer to the Current Medication list given to you today.  *If you need a refill on your cardiac medications before your next appointment, please call your pharmacy*   Lab Work: None ordered   Testing/Procedures: None ordered   Follow-Up: At [facility], you and your health needs are our priority.  As part of our continuing mission to provide you with exceptional heart care, we have created designated Provider Care Teams.  These Care Teams include your primary Cardiologist (physician) and Advanced Practice Providers (APPs -  Physician Assistants and Nurse Practitioners) who all work together to provide you with the care you need, when you need it.  Your next appointment:   6 month(s)  The format for your next appointment:   In Person  Provider:   You will follow up in the Atrial Fibrillation Clinic located at [facility]. Your provider will be: Clint R. Fenton, PA-C  Or  Jon Suarez, PA  Thank you for choosing [facility]!!    Sherri Price, RN [phone]          

## 2022-12-03 ENCOUNTER — Other Ambulatory Visit (HOSPITAL_BASED_OUTPATIENT_CLINIC_OR_DEPARTMENT_OTHER): Payer: Self-pay

## 2022-12-16 DIAGNOSIS — N8182 Incompetence or weakening of pubocervical tissue: Secondary | ICD-10-CM | POA: Diagnosis not present

## 2022-12-16 DIAGNOSIS — N760 Acute vaginitis: Secondary | ICD-10-CM | POA: Diagnosis not present

## 2023-01-26 ENCOUNTER — Encounter: Payer: Self-pay | Admitting: Orthopedic Surgery

## 2023-01-26 ENCOUNTER — Ambulatory Visit: Payer: PPO | Admitting: Orthopedic Surgery

## 2023-01-26 DIAGNOSIS — Z96651 Presence of right artificial knee joint: Secondary | ICD-10-CM

## 2023-01-26 DIAGNOSIS — G8929 Other chronic pain: Secondary | ICD-10-CM

## 2023-01-26 DIAGNOSIS — M25511 Pain in right shoulder: Secondary | ICD-10-CM | POA: Diagnosis not present

## 2023-01-26 MED ORDER — LIDOCAINE HCL 1 % IJ SOLN
5.0000 mL | INTRAMUSCULAR | Status: AC | PRN
Start: 2023-01-26 — End: 2023-01-26
  Administered 2023-01-26: 5 mL

## 2023-01-26 MED ORDER — METHYLPREDNISOLONE ACETATE 40 MG/ML IJ SUSP
40.0000 mg | INTRAMUSCULAR | Status: AC | PRN
Start: 2023-01-26 — End: 2023-01-26
  Administered 2023-01-26: 40 mg via INTRA_ARTICULAR

## 2023-01-26 NOTE — Progress Notes (Signed)
Office Visit Note   Patient: Valerie Love           Date of Birth: 02-04-1956           MRN: [id number] Visit Date: 01/26/2023              Requested by: Karie Georges, MD [address] [place],  [place] [address] PCP: Karie Georges, MD  Chief Complaint  Patient presents with   Right Shoulder - Pain      HPI: Patient is a 67 year old woman who presents with impingement symptoms right shoulder she has had good temporary relief with previous injections.  Last injection in February.  Patient states she has to do a lot of lifting to help take care of her father-in-law.  Patient is status post right total knee arthroplasty.  Patient states she started develop some right knee pain.  Assessment & Plan: Visit Diagnoses:  1. Chronic right shoulder pain     Plan: Patient underwent subacromial injection on the right.  She was given instructions and demonstrated strengthening for the right total knee arthroplasty.  Follow-Up Instructions: Return if symptoms worsen or fail to improve.   Ortho Exam  Patient is alert, oriented, no adenopathy, well-dressed, normal affect, normal respiratory effort. Examination patient has a normal gait no effusion of the right knee.  She has full range of motion of the right shoulder but does have pain with Neer and Hawkins impingement test.  Patient has had good relief in the past and wishes to proceed with repeat subacromial injection.  No adhesive capsulitis.  Imaging: No results found. No images are attached to the encounter.  Labs: Lab Results  Component Value Date   HGBA1C 5.6 06/22/2021   HGBA1C 5.5 08/28/2020   HGBA1C 5.6 06/10/2019   ESRSEDRATE 10 06/22/2021   ESRSEDRATE 17 04/18/2016   ESRSEDRATE 9 08/18/2010   CRP <1.0 06/22/2021   CRP 0.8 04/18/2016   GRAMSTAIN No WBC Seen 04/18/2016   GRAMSTAIN No Squamous Epithelial Cells Seen 04/18/2016   GRAMSTAIN No Organisms Seen 04/18/2016   LABORGA Few GROUP B STREP  (S.AGALACTIAE) ISOLATED 04/18/2016     Lab Results  Component Value Date   ALBUMIN 4.1 10/25/2022   ALBUMIN 4.2 06/22/2021   ALBUMIN 3.9 08/28/2020    Lab Results  Component Value Date   MG 1.9 11/09/2021   MG 2.3 11/12/2018   No results found for: "VD25OH"  No results found for: "PREALBUMIN"    Latest Ref Rng & Units 07/28/2022   10:40 AM 11/09/2021    7:42 PM 06/22/2021   11:47 AM  CBC EXTENDED  WBC 3.4 - 10.8 x10E3/uL 4.9  6.6  5.4   RBC 3.77 - 5.28 x10E6/uL 4.50  4.12  4.54   Hemoglobin 11.1 - 15.9 g/dL 19.1  47.8  29.5   HCT 34.0 - 46.6 % 43.2  38.6  41.2   Platelets 150 - 450 x10E3/uL 258  225  261.0   NEUT# 1.4 - 7.7 K/uL   3.1   Lymph# 0.7 - 4.0 K/uL   1.6      There is no height or weight on file to calculate BMI.  Orders:  No orders of the defined types were placed in this encounter.  No orders of the defined types were placed in this encounter.    Procedures: Large Joint Inj: R subacromial bursa on 01/26/2023 1:53 PM Indications: diagnostic evaluation and pain Details: 22 G 1.5 in needle, posterior approach  Arthrogram: No  Medications: 5 mL lidocaine 1 %; 40 mg methylPREDNISolone acetate 40 MG/ML Outcome: tolerated well, no immediate complications Procedure, treatment alternatives, risks and benefits explained, specific risks discussed. Consent was given by the patient. Immediately prior to procedure a time out was called to verify the correct patient, procedure, equipment, support staff and site/side marked as required. Patient was prepped and draped in the usual sterile fashion.      Clinical Data: No additional findings.  ROS:  All other systems negative, except as noted in the HPI. Review of Systems  Objective: Vital Signs: There were no vitals taken for this visit.  Specialty Comments:  EXAM: MRI CERVICAL SPINE WITHOUT CONTRAST   TECHNIQUE: Multiplanar, multisequence MR imaging of the cervical spine was performed. No intravenous  contrast was administered.   COMPARISON:  Radiographs of the cervical spine 03/16/2020 (images available, report unavailable).   FINDINGS: Mild intermittent motion degradation.   Alignment: Reversal of the expected cervical lordosis. 2 mm C2-C3 grade 1 anterolisthesis. 2-3 mm C3-C4 grade 1 anterolisthesis.   Vertebrae: Vertebral body height is maintained. Mild degenerative endplate edema at W0-J8, C5-C6, C6-C7 and C7-T1. Marrow edema within the bilateral posterior elements at C2-C3 and on the left at C3-C4, likely degenerative and related to facet arthrosis.   Cord: No signal abnormality identified within the cervical spinal cord   Posterior Fossa, vertebral arteries, paraspinal tissues: No abnormality identified within included portions of the posterior fossa. Flow voids preserved within the imaged cervical vertebral arteries. Paraspinal soft tissues unremarkable.   Disc levels:   Multilevel disc space narrowing, greatest at C4-C5 (moderate), C5-C6 (moderate/advanced), C6-C7 (moderate/advanced) and C7-T1 (moderate/advanced).   C2-C3: Grade 1 anterolisthesis. Slight disc uncovering. Facet arthrosis on the right. No significant spinal canal or foraminal stenosis.   C3-C4: Grade 1 anterolisthesis. Slight disc uncovering and disc bulge. Uncovertebral hypertrophy on the left. Facet arthrosis (greater on the left and fairly advanced on the left). No significant spinal canal stenosis. Severe left neural foraminal narrowing.   C4-C5: Disc bulge. Endplate spurring with bilateral disc osteophyte ridge/uncinate hypertrophy. A disc bulge effaces the ventral thecal sac, contacting and minimally flattening the ventral spinal cord. However, the dorsal CSF space is maintained within the spinal canal. Bilateral neural foraminal narrowing (mild right, severe left).   C5-C6: Disc bilateral disc osteophyte ridge/uncinate hypertrophy. Mild-to-moderate spinal canal stenosis. The disc bulge  contacts and minimally flattens the ventral aspect of the spinal cord. Bilateral neural foraminal narrowing (severe right, mild left).   C6-C7: Disc bulge. Bilateral disc osteophyte ridge/uncinate hypertrophy. Mild spinal canal stenosis (without significant spinal cord mass effect). Severe bilateral neural foraminal narrowing.   C7-T1: Disc bulge. Right-sided disc osteophyte ridge. Facet arthrosis. No significant spinal canal stenosis. Mild right neural foraminal narrowing.   IMPRESSION: Cervical spondylosis, as outlined and with findings most notably as follows.   At C5-C6, there is moderate/advanced disc degeneration. Disc bulge. Bilateral disc osteophyte ridge/uncinate hypertrophy. Mild to moderate spinal canal narrowing. The disc bulge contacts and minimally flattens the ventral aspect of the spinal cord. Bilateral neural foraminal narrowing (severe right, mild left).   At C4-C5, there is moderate disc degeneration. Disc bulge with bilateral disc osteophyte ridge/uncinate hypertrophy. The disc bulge contacts and minimally flattens the ventral spinal cord. However, the dorsal CSF space is maintained within the spinal canal. Bilateral neural foraminal narrowing (mild right, severe left).   No more than mild spinal canal narrowing at the remaining levels. Additional sites of foraminal stenosis, as detailed and greatest  on the left at C3-C4 (severe) and bilaterally at C6-C7 (severe).   Also of note, disc degeneration is moderate/advanced at C6-C7 and C7-T1.   Degenerative marrow edema as described.   Nonspecific reversal of expected cervical lordosis. Grade 1 anterolisthesis at C2-C3 and C3-C4.     Electronically Signed   By: Jackey Loge D.O.   On: 07/15/2021 07:41  PMFS History: Patient Active Problem List   Diagnosis Date Noted   Asthma in adult 10/25/2022   Hypercoagulable state due to paroxysmal atrial fibrillation (HCC) 11/16/2021   Atrial fibrillation with RVR  (HCC) 11/10/2021   Atrial fibrillation with rapid ventricular response (HCC) 11/09/2021   Acute systolic heart failure (HCC) 11/01/2018   NSTEMI (non-ST elevated myocardial infarction) (HCC) 10/30/2018   Hyperlipidemia 06/13/2018   Lactose intolerance 06/13/2018   S/P total knee arthroplasty, right 03/16/2016   Unilateral primary osteoarthritis, right knee    Special screening for malignant neoplasms, colon 12/29/2014   Diarrhea 12/29/2014   Medication management 05/13/2013   Does not have health insurance 05/13/2013   Adjustment reaction with anxiety and depression 03/12/2012   Medication monitoring encounter 03/12/2012   Hemorrhoids 08/13/2011   Perineal itching, female 08/13/2011   LBBB (left bundle branch block) 09/24/2010   Murmur 09/08/2010   EKG, abnormal 08/19/2010   Visit for preventive health examination 08/19/2010   Palpitations 08/18/2010   Dizziness 08/18/2010   Arthritis 08/18/2010   Anxiety state 06/18/2008   GRIEF REACTION 06/18/2008   DEPRESSION, MAJOR, RECURRENT 07/06/2006   ARTHRALGIA, UNSPECIFIED 07/06/2006   Past Medical History:  Diagnosis Date   Anxiety    Arthritis    R knee, back, hands    Cellulitis of knee 04/2016   RT KNEE   Cellulitis of right knee 04/18/2016   Childhood asthma    Complication of anesthesia    woke up slowly - 33yrs. ago   Depression    related to husband illness and death   History of jaundice    as a child     Hx of varicella    Hyperthyroidism    during pregnancy, treated & resolved post partum    IBS (irritable bowel syndrome)    LBBB (left bundle branch block)    Scoliosis    Scoliosis 07/2015   Thyroid disease in pregnancy    TOBACCO DEPENDENCE 07/06/2006   Qualifier: Diagnosis of  By: Knox Royalty     Troponin level elevated     Family History  Problem Relation Age of Onset   Alcohol abuse Mother        died from stomach ulcers   Stroke Father        died  from cva and mi   Hypertension Father     Hyperlipidemia Father    Coronary artery disease Father        Age 76   Colon polyps Sister    Hepatitis C Brother    Colon cancer Neg Hx     Past Surgical History:  Procedure Laterality Date   ATRIAL FIBRILLATION ABLATION N/A 08/11/2022   Procedure: ATRIAL FIBRILLATION ABLATION;  Surgeon: Regan Lemming, MD;  Location: [facility];  Service: Cardiovascular;  Laterality: N/A;   BREAST BIOPSY Right 1979   benign    Cyst removed from ovary     KNEE SURGERY     right Lurie Mullane    LEFT HEART CATH AND CORONARY ANGIOGRAPHY N/A 10/31/2018   Procedure: LEFT HEART CATH AND CORONARY ANGIOGRAPHY;  Surgeon: Tresa Endo,  Clovis Pu, MD;  Location: [facility];  Service: Cardiovascular;  Laterality: N/A;   TONSILLECTOMY AND ADENOIDECTOMY  1073   TOTAL KNEE ARTHROPLASTY 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   Procedure: TOTAL KNEE ARTHROPLASTY;  Surgeon: [name], MD;  Location: [facility] OR;  Service: Orthopedics;  Laterality: Right;   TUBAL LIGATION     WISDOM TOOTH EXTRACTION     Social History   Occupational History   Occupation: self    Comment: Geneticist, molecular  Tobacco Use   Smoking status: Former    Current packs/day: 0.00    Average packs/day: 0.3 packs/day for 40.0 years (10.0 ttl pk-yrs)    Types: Cigarettes    Start date: 01/06/1976    Quit date: 01/06/2016    Years since quitting: 7.0   Smokeless tobacco: Never   Tobacco comments:    Former smoker 11/16/21  Substance and Sexual Activity   Alcohol use: Yes    Alcohol/week: 7.0 standard drinks of alcohol    Types: 7 Glasses of wine per week    Comment: 1 glass of wine daily 11/16/21   Drug use: No   Sexual activity: Not on file

## 2023-02-04 ENCOUNTER — Other Ambulatory Visit: Payer: Self-pay | Admitting: Family Medicine

## 2023-02-07 ENCOUNTER — Telehealth: Payer: Self-pay | Admitting: *Deleted

## 2023-02-07 MED ORDER — CITALOPRAM HYDROBROMIDE 20 MG PO TABS: 20.0000 mg | ORAL_TABLET | Freq: Every day | ORAL | 1 refills | Status: DC

## 2023-02-07 NOTE — Telephone Encounter (Signed)
Rx done. 

## 2023-02-14 DIAGNOSIS — N760 Acute vaginitis: Secondary | ICD-10-CM | POA: Diagnosis not present

## 2023-02-14 DIAGNOSIS — Z4689 Encounter for fitting and adjustment of other specified devices: Secondary | ICD-10-CM | POA: Diagnosis not present

## 2023-03-07 ENCOUNTER — Emergency Department (HOSPITAL_BASED_OUTPATIENT_CLINIC_OR_DEPARTMENT_OTHER)
Admission: EM | Admit: 2023-03-07 | Discharge: 2023-03-07 | Disposition: A | Discharge status: Home / Self Care | Payer: PPO | Attending: Emergency Medicine | Admitting: Emergency Medicine

## 2023-03-07 ENCOUNTER — Emergency Department (HOSPITAL_BASED_OUTPATIENT_CLINIC_OR_DEPARTMENT_OTHER): Payer: PPO

## 2023-03-07 ENCOUNTER — Other Ambulatory Visit: Payer: Self-pay

## 2023-03-07 DIAGNOSIS — Z7951 Long term (current) use of inhaled steroids: Secondary | ICD-10-CM | POA: Diagnosis not present

## 2023-03-07 DIAGNOSIS — Z7901 Long term (current) use of anticoagulants: Secondary | ICD-10-CM | POA: Insufficient documentation

## 2023-03-07 DIAGNOSIS — R42 Dizziness and giddiness: Secondary | ICD-10-CM | POA: Diagnosis not present

## 2023-03-07 DIAGNOSIS — I471 Supraventricular tachycardia, unspecified: Secondary | ICD-10-CM | POA: Insufficient documentation

## 2023-03-07 DIAGNOSIS — R0789 Other chest pain: Secondary | ICD-10-CM | POA: Diagnosis not present

## 2023-03-07 DIAGNOSIS — Z79899 Other long term (current) drug therapy: Secondary | ICD-10-CM | POA: Diagnosis not present

## 2023-03-07 DIAGNOSIS — E039 Hypothyroidism, unspecified: Secondary | ICD-10-CM | POA: Diagnosis not present

## 2023-03-07 DIAGNOSIS — J45909 Unspecified asthma, uncomplicated: Secondary | ICD-10-CM | POA: Diagnosis not present

## 2023-03-07 DIAGNOSIS — R002 Palpitations: Secondary | ICD-10-CM | POA: Diagnosis present

## 2023-03-07 DIAGNOSIS — I4891 Unspecified atrial fibrillation: Secondary | ICD-10-CM | POA: Insufficient documentation

## 2023-03-07 DIAGNOSIS — I447 Left bundle-branch block, unspecified: Secondary | ICD-10-CM | POA: Diagnosis not present

## 2023-03-07 DIAGNOSIS — I5020 Unspecified systolic (congestive) heart failure: Secondary | ICD-10-CM | POA: Insufficient documentation

## 2023-03-07 LAB — CBC
HCT: 43.6 % (ref 36.0–46.0)
Hemoglobin: 14.7 g/dL (ref 12.0–15.0)
MCH: 31.1 pg (ref 26.0–34.0)
MCHC: 33.7 g/dL (ref 30.0–36.0)
MCV: 92.4 fL (ref 80.0–100.0)
Platelets: 274 10*3/uL (ref 150–400)
RBC: 4.72 MIL/uL (ref 3.87–5.11)
RDW: 12.5 % (ref 11.5–15.5)
WBC: 8.5 10*3/uL (ref 4.0–10.5)
nRBC: 0 % (ref 0.0–0.2)

## 2023-03-07 LAB — BASIC METABOLIC PANEL
Anion gap: 8 (ref 5–15)
BUN: 22 mg/dL (ref 8–23)
CO2: 27 mmol/L (ref 22–32)
Calcium: 9.6 mg/dL (ref 8.9–10.3)
Chloride: 103 mmol/L (ref 98–111)
Creatinine, Ser: 0.68 mg/dL (ref 0.44–1.00)
GFR, Estimated: >60 mL/min (ref >60)
Glucose, Bld: 105 mg/dL — ABNORMAL HIGH (ref 70–99)
Potassium: 4 mmol/L (ref 3.5–5.1)
Sodium: 138 mmol/L (ref 135–145)

## 2023-03-07 LAB — TROPONIN I (HIGH SENSITIVITY): Troponin I (High Sensitivity): 5 ng/L (ref <18)

## 2023-03-07 LAB — MAGNESIUM: Magnesium: 2 mg/dL (ref 1.7–2.4)

## 2023-03-07 LAB — BRAIN NATRIURETIC PEPTIDE: B Natriuretic Peptide: 223.3 pg/mL — ABNORMAL HIGH (ref 0.0–100.0)

## 2023-03-07 MED ORDER — AMIODARONE HCL IN DEXTROSE 360-4.14 MG/200ML-% IV SOLN: 30.0000 mg/h | INTRAVENOUS | Status: DC

## 2023-03-07 MED ORDER — AMIODARONE HCL IN DEXTROSE 360-4.14 MG/200ML-% IV SOLN: 60.0000 mg/h | INTRAVENOUS | Status: DC

## 2023-03-07 MED ORDER — AMIODARONE LOAD VIA INFUSION: 150.0000 mg | Freq: Once | INTRAVENOUS | Status: DC

## 2023-03-07 MED ORDER — ASPIRIN 81 MG PO CHEW
324.0000 mg | CHEWABLE_TABLET | Freq: Once | ORAL | Status: AC
  Administered 2023-03-07: 324 mg via ORAL
  Filled 2023-03-07: qty 4

## 2023-03-07 MED ORDER — AMIODARONE IV BOLUS ONLY 150 MG/100ML
150.0000 mg | Freq: Once | INTRAVENOUS | Status: AC
  Administered 2023-03-07: 150 mg via INTRAVENOUS
  Filled 2023-03-07: qty 100

## 2023-03-07 NOTE — ED Notes (Signed)
RT Note: Patient came in saying that she felt like she is having a heart attack. Patient was placed on 2lpm [place] with ETCO2 connection for comfort and monitoring HR 169, RR 20, SPO2 100% on 2L,     ETCO2=29

## 2023-03-07 NOTE — ED Triage Notes (Signed)
Pt POV from home reporting HR 160s, SOB, jaw pain and L arm tingling. Hx afib, ablation in April.

## 2023-03-07 NOTE — ED Notes (Signed)
RT Note: Currently post a vagal maneuver , Patient oxygen saturation 98% on 2L , HR 84, RR 17, and ETCO2=32

## 2023-03-07 NOTE — ED Provider Notes (Incomplete)
[place] EMERGENCY DEPARTMENT AT [facility] Provider Note   CSN: [id number] Arrival date & time: 03/07/23  1733     History {Add pertinent medical, surgical, social history, OB history to HPI:1} Chief Complaint  Patient presents with   Palpitations    Valerie Love is a 67 y.o. female.  HPI     Home Medications Prior to Admission medications   Medication Sig Start Date End Date Taking? Authorizing Provider  acetaminophen (TYLENOL) 650 MG CR tablet Take 1,000 mg by mouth 2 (two) times daily.    [provider]  albuterol (VENTOLIN HFA) 108 (90 Base) MCG/ACT inhaler Inhale 2 puffs into the lungs every 6 (six) hours as needed for wheezing or shortness of breath. 10/27/22   Karie Georges, MD  ALPRAZolam Prudy Feeler) 0.5 MG tablet TAKE 1 TABLET BY MOUTH THREE TIMES A DAY AS NEEDED FOR ANXIETY. MAXIMUM 3 TALBETS PER 24 HOURS 11/24/21   Panosh, Neta Mends, MD  apixaban (ELIQUIS) 5 MG TABS tablet Take 1 tablet (5 mg total) by mouth 2 (two) times daily. 11/16/21   Fenton, Clint R, PA  Calcium Carb-Cholecalciferol (CALCIUM+D3) 600-800 MG-UNIT TABS Take 1 tablet by mouth daily with breakfast.    [provider]  citalopram (CELEXA) 20 MG tablet Take 1 tablet by mouth once daily 03/07/23   Karie Georges, MD  citalopram (CELEXA) 20 MG tablet Take 1 tablet (20 mg total) by mouth daily. 02/07/23   Karie Georges, MD  diltiazem (CARDIZEM CD) 180 MG 24 hr capsule Take 1 capsule (180 mg total) by mouth daily. 02/22/22   Runell Gess, MD  diltiazem (CARDIZEM) 30 MG tablet Take one tablet by mouth every 4 hours as needed for HR greater than 100, top number B/P needs to be above 100 02/28/22   Fenton, Clint R, PA  diphenhydrAMINE (BENADRYL) 25 MG tablet Take 25 mg by mouth at bedtime as needed for sleep or itching.    [provider]  Multiple Vitamins-Minerals (ALIVE ONCE DAILY WOMENS 50+) TABS Take 1 tablet by mouth daily.    [provider]   Probiotic Product (TRUBIOTICS) CAPS Take 1 capsule by mouth daily.    [provider]  rosuvastatin (CRESTOR) 5 MG tablet Take 1 tablet by mouth once daily 11/28/22   Graciella Freer, PA-C      Allergies    Codeine and Lactose intolerance (gi)    Review of Systems   Review of Systems  Physical Exam Updated Vital Signs BP 122/75   Pulse 82   Temp (!) 97.5 F (36.4 C) (Oral)   Resp 19   Ht 5\' 2"  (1.575 m)   Wt 64.9 kg   SpO2 99%   BMI 26.16 kg/m  Physical Exam  ED Results / Procedures / Treatments   Labs (all labs ordered are listed, but only abnormal results are displayed) Labs Reviewed  BASIC METABOLIC PANEL - Abnormal; Notable for the following components:      Result Value   Glucose, Bld 105 (*)    All other components within normal limits  CBC  MAGNESIUM  BRAIN NATRIURETIC PEPTIDE  TROPONIN I (HIGH SENSITIVITY)    EKG EKG Interpretation Date/Time:  Tuesday March 07 2023 18:32:01 EDT Ventricular Rate:  82 PR Interval:  173 QRS Duration:  137 QT Interval:  448 QTC Calculation: 524 R Axis:   32  Text Interpretation: Sinus rhythm Left bundle branch block No significant change since last tracing Confirmed by Alvira Monday (  [id number]) on 03/07/2023 6:37:41 PM  Radiology No results found.  Procedures Procedures  {Document cardiac monitor, telemetry assessment procedure when appropriate:1}  Medications Ordered in ED Medications  amiodarone (NEXTERONE) 1.5 mg/mL IV bolus only 150 mg (150 mg Intravenous New Bag/Given 03/07/23 1815)  aspirin chewable tablet 324 mg (324 mg Oral Given 03/07/23 1812)    ED Course/ Medical Decision Making/ A&P   {   Click here for ABCD2, HEART and other calculatorsREFRESH Note before signing :1}                              Medical Decision Making Amount and/or Complexity of Data Reviewed Labs: ordered. Radiology: ordered.  Risk OTC drugs. Prescription drug management.   ***  {Document critical  care time when appropriate:1} {Document review of labs and clinical decision tools ie heart score, Chads2Vasc2 etc:1}  {Document your independent review of radiology images, and any outside records:1} {Document your discussion with family members, caretakers, and with consultants:1} {Document social determinants of health affecting pt's care:1} {Document your decision making why or why not admission, treatments were needed:1} Final Clinical Impression(s) / ED Diagnoses Final diagnoses:  None    Rx / DC Orders ED Discharge Orders     None

## 2023-03-09 ENCOUNTER — Other Ambulatory Visit: Payer: Self-pay | Admitting: Family Medicine

## 2023-03-09 ENCOUNTER — Telehealth: Payer: Self-pay | Admitting: Cardiology

## 2023-03-09 DIAGNOSIS — J4520 Mild intermittent asthma, uncomplicated: Secondary | ICD-10-CM

## 2023-03-09 NOTE — Telephone Encounter (Signed)
  Per MyChart scheduling message:  Patient requested a hospital f/u which she is now scheduled for. She had another question I placed below:  Dr. Derrell Lolling told me I could go off my meds so I did. I have no more of the pill that slows the heart down but I do have Eliquis left. Should I go back on that.? They said last night at the hospital, they thought it was. SVT.

## 2023-03-09 NOTE — Telephone Encounter (Signed)
Pt was r/s to see Brandy 11/22 (pt r/s this). I assume she should remain on Eliquis...Marland KitchenMarland Kitchen Please advise before I call her back.

## 2023-03-10 ENCOUNTER — Telehealth: Payer: Self-pay | Admitting: Cardiovascular Disease

## 2023-03-10 NOTE — Telephone Encounter (Signed)
Called left message  - information will be sent to Dr Elberta Fortis and/or assistants ( [address] Triage)    Patient will probably need an appointment with  EP not general cardiology

## 2023-03-10 NOTE — Telephone Encounter (Signed)
Left detailed message, but asked pt to call office about Dr. Elberta Fortis advisement.   (Pt should remain on Eliquis at this time.  Increase Diltiazem to 300 mg daily.  Keep EP appt on 11/22)

## 2023-03-10 NOTE — Telephone Encounter (Signed)
Baird Lyons, RN at 03/09/2023 10:07 AM  Status: Signed  Pt was r/s to see Gearldine Bienenstock 11/22 (pt r/s this). I assume she should remain on Eliquis...Marland KitchenMarland Kitchen Please advise before I call her back.

## 2023-03-10 NOTE — Telephone Encounter (Signed)
See 11/1 telephone encounter for more documentation on this.

## 2023-03-10 NOTE — Telephone Encounter (Signed)
Patient is calling because she went to the hospital for her SVT. Patient stated when she went to the hospital, she was having symptoms of pain in her jaw, nauseous, and HR of 180. Patient would like to know if there is medication that she can take. Please advise.

## 2023-03-11 ENCOUNTER — Telehealth: Payer: Self-pay | Admitting: Cardiology

## 2023-03-11 NOTE — Telephone Encounter (Signed)
Outpatient service line: Chief complaint: Medication questions  Received a page that she was wanting more information about medications.  Attempted to call patient twice.  Left voicemail with no answer.  Also attempted to call daughter.  Gave instructions to call back and page if needed.

## 2023-03-13 MED ORDER — DILTIAZEM HCL ER COATED BEADS 180 MG PO CP24: 180.0000 mg | ORAL_CAPSULE | Freq: Every day | ORAL | 11 refills | Status: DC

## 2023-03-13 MED ORDER — DILTIAZEM HCL ER COATED BEADS 300 MG PO CP24: 300.0000 mg | ORAL_CAPSULE | Freq: Every day | ORAL | 3 refills | Status: DC

## 2023-03-13 NOTE — Telephone Encounter (Signed)
Patient is returning RN's call. Please advise. 

## 2023-03-13 NOTE — Telephone Encounter (Signed)
Patient has not taken anything like diltiazem for over 30 days, just wanted to clarify that patient should take diltiazem 300 mg daily. Patient's HR is in 70's at this time. Only episode of elevated HR was on 03/07/23 at ER, patient was in SVT and given amiodarone. Will send message to Dr. Elberta Fortis for clarification.

## 2023-03-13 NOTE — Telephone Encounter (Signed)
Regan Lemming, MD  to Me    03/13/23  2:09 PM  Can start 180 mg daily  Called patient back to let her know that Dr. Elberta Fortis wants her to take diltiazem 180 mg by mouth daily. Patient agreed to plan. Called patient's pharmacy at [facility] and canceled order for diltiazem 300 mg.

## 2023-03-13 NOTE — Telephone Encounter (Signed)
Patient called back to say she doesn't have Diltiazem.

## 2023-03-21 ENCOUNTER — Ambulatory Visit: Payer: PPO | Admitting: Internal Medicine

## 2023-03-21 ENCOUNTER — Ambulatory Visit: Payer: PPO | Admitting: Family Medicine

## 2023-03-22 ENCOUNTER — Ambulatory Visit: Payer: PPO | Admitting: Physician Assistant

## 2023-03-28 ENCOUNTER — Ambulatory Visit (INDEPENDENT_AMBULATORY_CARE_PROVIDER_SITE_OTHER): Payer: PPO | Admitting: Family Medicine

## 2023-03-28 ENCOUNTER — Encounter: Payer: Self-pay | Admitting: Family Medicine

## 2023-03-28 VITALS — BP 120/80 | HR 60 | Temp 97.8°F | Ht 62.0 in | Wt 147.1 lb

## 2023-03-28 DIAGNOSIS — N819 Female genital prolapse, unspecified: Secondary | ICD-10-CM | POA: Diagnosis not present

## 2023-03-28 DIAGNOSIS — Z23 Encounter for immunization: Secondary | ICD-10-CM

## 2023-03-28 DIAGNOSIS — F411 Generalized anxiety disorder: Secondary | ICD-10-CM | POA: Diagnosis not present

## 2023-03-28 MED ORDER — CITALOPRAM HYDROBROMIDE 20 MG PO TABS: 30.0000 mg | ORAL_TABLET | Freq: Every day | ORAL | 1 refills | Status: DC

## 2023-03-28 MED ORDER — ALPRAZOLAM 0.5 MG PO TABS: ORAL_TABLET | ORAL | 0 refills | Status: AC

## 2023-03-28 NOTE — Progress Notes (Signed)
Established Patient Office Visit  Subjective   Patient ID: Valerie Love, female    DOB: 03/20/1956  Age: 67 y.o. MRN: [id number]  Chief Complaint  Patient presents with   Medical Management of Chronic Issues    Anxiety -- pt reports she she is taking care of her father in law. She reports that she is having an increased feeling that "nothing in her life is bringing her joy". She is asking for a refill also on her yearly xanax prescription. States that she doesn't take it often, only once or twice per month.   SVT-- pt had a recent episode of SVT. Just had atrial ablation in the spring, states that she saw the cardiologist and was placed back on her blood thinners and rate control medication. States that the cardiologist will be following up with her soon. I reviewed the ER notes and labs with the patient.     Current Outpatient Medications  Medication Instructions   acetaminophen (TYLENOL) 1,000 mg, Oral, 2 times daily   albuterol (VENTOLIN HFA) 108 (90 Base) MCG/ACT inhaler INHALE 2 PUFFS BY MOUTH EVERY 6 HOURS AS NEEDED FOR WHEEZING FOR SHORTNESS OF BREATH   ALPRAZolam (XANAX) 0.5 MG tablet TAKE 1 TABLET BY MOUTH THREE TIMES A DAY AS NEEDED FOR ANXIETY. MAXIMUM 3 TALBETS PER 24 HOURS   apixaban (ELIQUIS) 5 mg, Oral, 2 times daily   Calcium Carb-Cholecalciferol (CALCIUM+D3) 600-800 MG-UNIT TABS 1 tablet, Oral, Daily with breakfast   citalopram (CELEXA) 30 mg, Oral, Daily   diltiazem (CARDIZEM CD) 180 mg, Oral, Daily   diltiazem (CARDIZEM) 30 MG tablet Take one tablet by mouth every 4 hours as needed for HR greater than 100, top number B/P needs to be above 100   diphenhydrAMINE (BENADRYL) 25 mg, Oral, At bedtime PRN   Multiple Vitamins-Minerals (ALIVE ONCE DAILY WOMENS 50+) TABS 1 tablet, Oral, Daily   Probiotic Product (TRUBIOTICS) CAPS 1 capsule, Oral, Daily   rosuvastatin (CRESTOR) 5 mg, Oral, Daily    Patient Active Problem List   Diagnosis Date Noted   Asthma in adult  10/25/2022   Hypercoagulable state due to paroxysmal atrial fibrillation (HCC) 11/16/2021   Atrial fibrillation with RVR (HCC) 11/10/2021   Atrial fibrillation with rapid ventricular response (HCC) 11/09/2021   Acute systolic heart failure (HCC) 11/01/2018   NSTEMI (non-ST elevated myocardial infarction) (HCC) 10/30/2018   Hyperlipidemia 06/13/2018   Lactose intolerance 06/13/2018   S/P total knee arthroplasty, right 03/16/2016   Unilateral primary osteoarthritis, right knee    Special screening for malignant neoplasms, colon 12/29/2014   Diarrhea 12/29/2014   Medication management 05/13/2013   Does not have health insurance 05/13/2013   Adjustment reaction with anxiety and depression 03/12/2012   Medication monitoring encounter 03/12/2012   Hemorrhoids 08/13/2011   Perineal itching, female 08/13/2011   LBBB (left bundle branch block) 09/24/2010   Murmur 09/08/2010   EKG, abnormal 08/19/2010   Visit for preventive health examination 08/19/2010   Palpitations 08/18/2010   Dizziness 08/18/2010   Arthritis 08/18/2010   Anxiety state 06/18/2008   GRIEF REACTION 06/18/2008   DEPRESSION, MAJOR, RECURRENT 07/06/2006   ARTHRALGIA, UNSPECIFIED 07/06/2006      Review of Systems  All other systems reviewed and are negative.     Objective:     BP 120/80 (BP Location: Left Arm, Patient Position: Sitting, Cuff Size: Normal)   Pulse 60   Temp 97.8 F (36.6 C) (Oral)   Ht 5\' 2"  (1.575 m)   Wt 147  lb 1.6 oz (66.7 kg)   SpO2 97%   BMI 26.90 kg/m    Physical Exam Vitals reviewed.  Constitutional:      Appearance: Normal appearance. She is well-groomed and normal weight.  Eyes:     Conjunctiva/sclera: Conjunctivae normal.  Cardiovascular:     Rate and Rhythm: Normal rate and regular rhythm.     Pulses: Normal pulses.     Heart sounds: S1 normal and S2 normal.  Pulmonary:     Effort: Pulmonary effort is normal.     Breath sounds: Normal breath sounds and air entry.   Musculoskeletal:     Right lower leg: No edema.     Left lower leg: No edema.  Neurological:     Mental Status: She is alert and oriented to person, place, and time. Mental status is at baseline.     Gait: Gait is intact.  Psychiatric:        Mood and Affect: Mood and affect normal.        Speech: Speech normal.        Behavior: Behavior normal.        Judgment: Judgment normal.      No results found for any visits on 03/28/23.    The ASCVD Risk score (Arnett DK, et al., 2019) failed to calculate for the following reasons:   The patient has a prior MI or stroke diagnosis    Assessment & Plan:  Need for immunization against influenza -     Flu Vaccine Trivalent High Dose (Fluad)  Anxiety state Assessment & Plan: Will try increasing her citalopram to 30 mg daily, instructed patient that if this is not enough then she can increase to 40 mg daily and send me a message on mychart. I also refilled her xanax, we discussed the risks/benefits of long term benzodiazepine use-- she only takes it 1-2 times per month so her overall use is lower risk for dependency.  Orders: -     ALPRAZolam; TAKE 1 TABLET BY MOUTH THREE TIMES A DAY AS NEEDED FOR ANXIETY. MAXIMUM 3 TALBETS PER 24 HOURS  Dispense: 30 tablet; Refill: 0 -     Citalopram Hydrobromide; Take 1.5 tablets (30 mg total) by mouth daily.  Dispense: 135 tablet; Refill: 1  Female genital prolapse, unspecified type -     Ambulatory referral to Gynecology  Pt requesting new referral to GYN for her uterine prolapse.    Return in about 6 months (around 09/25/2023) for annual physical exam.    Karie Georges, MD

## 2023-03-28 NOTE — Assessment & Plan Note (Signed)
Will try increasing her citalopram to 30 mg daily, instructed patient that if this is not enough then she can increase to 40 mg daily and send me a message on mychart. I also refilled her xanax, we discussed the risks/benefits of long term benzodiazepine use-- she only takes it 1-2 times per month so her overall use is lower risk for dependency.

## 2023-03-30 NOTE — Progress Notes (Signed)
  Electrophysiology Office Note:   Date:  03/31/2023  ID:  Tambre, Tippens 26-Mar-1956, MRN [id number]  Primary Cardiologist: Nanetta Batty, MD Electrophysiologist: Regan Lemming, MD      History of Present Illness:   Valerie Love is a 67 y.o. female with h/o atrial fibrillation seen today for routine electrophysiology followup.   Last seen in EP Clinic 08/2022 post PVI ablation. She had not had further AF.  Plan was to continue diltiazem and eliquis for another 6 months and if no further episodes, may decide to stop both medications.   She was seen in ER on 11/1 for pain in her jaw, nausea, and HR of 180 bpm thought to be SVT. Per ER notes, she had already stopped her OAC. She was treated with IV amiodarone, ASA 324mg  x1. During amiodarone bolus, vagal maneuver was completed and patient converted to NSR. No significant lab abnormalities.   Since last being seen in our clinic the patient reports she is back on her cardizem and eliquis. She states she does not want to come off the meds > her father had a stroke and she does not want to go through what he did. No further episodes of fast rhythm / known AF.   She denies chest pain, palpitations, dyspnea, PND, orthopnea, nausea, vomiting, dizziness, syncope, edema, weight gain, or early satiety.   Review of systems complete and found to be negative unless listed in HPI.   EP Information / Studies Reviewed:    EKG is not ordered today. EKG from 03/10/23 reviewed which showed SR with LBBB      Studies:  ECHO 11/2021 > LVEF 55-60%, no RWMA, G1DD CT Cardiac Morphology 07/3022 > normal pulmonary vein drainage, patent LAA, scattered coronary artery calcifications    Arrhythmia / AAD Paroxysmal Atrial Fibrillation s/p PVI ablation 08/11/22  Risk Assessment/Calculations:    CHA2DS2-VASc Score = 2   This indicates a 2.2% annual risk of stroke. The patient's score is based upon: CHF History: 0 HTN History: 0 Diabetes History:  0 Stroke History: 0 Vascular Disease History: 0 Age Score: 1 Gender Score: 1              Physical Exam:   VS:  BP 130/60   Pulse 71   Ht 5\' 2"  (1.575 m)   Wt 148 lb 9.6 oz (67.4 kg)   SpO2 97%   BMI 27.18 kg/m    Wt Readings from Last 3 Encounters:  03/31/23 148 lb 9.6 oz (67.4 kg)  03/28/23 147 lb 1.6 oz (66.7 kg)  03/07/23 143 lb (64.9 kg)     GEN: Well nourished, well developed in no acute distress NECK: No JVD; No carotid bruits CARDIAC: Regular rate and rhythm, no murmurs, rubs, gallops RESPIRATORY:  Clear to auscultation without rales, wheezing or rhonchi  ABDOMEN: Soft, non-tender, non-distended EXTREMITIES:  No edema; No deformity   ASSESSMENT AND PLAN:    Paroxysmal Atrial Fibrillation  S/p ablation 08/2022. CHA2DS2-VASc 2  -continue OAC for stroke prophylaxis  -continue cardizem 180 mg daily  -PRN cardizem for HR > 100 sustained   Secondary Hypercoagulable State  -Eliquis 5mg  BID, dose reviewed and appropriate by age/wt   HFrecEF  -euvolemic on exam   Follow up with AF Clinic as previously planned and Dr. Elberta Fortis in 6 months  Signed, Canary Brim, MSN, APRN, NP-C, AGACNP-BC [facility] - Electrophysiology  03/31/2023, 4:27 PM

## 2023-03-31 ENCOUNTER — Encounter: Payer: Self-pay | Admitting: Pulmonary Disease

## 2023-03-31 ENCOUNTER — Ambulatory Visit: Payer: PPO | Attending: Pulmonary Disease | Admitting: Pulmonary Disease

## 2023-03-31 VITALS — BP 130/60 | HR 71 | Ht 62.0 in | Wt 148.6 lb

## 2023-03-31 DIAGNOSIS — I480 Paroxysmal atrial fibrillation: Secondary | ICD-10-CM

## 2023-03-31 DIAGNOSIS — I447 Left bundle-branch block, unspecified: Secondary | ICD-10-CM

## 2023-03-31 MED ORDER — DILTIAZEM HCL ER COATED BEADS 180 MG PO CP24: 180.0000 mg | ORAL_CAPSULE | Freq: Every day | ORAL | 11 refills | Status: DC

## 2023-03-31 MED ORDER — APIXABAN 5 MG PO TABS: 5.0000 mg | ORAL_TABLET | Freq: Two times a day (BID) | ORAL | 3 refills | Status: DC

## 2023-03-31 MED ORDER — DILTIAZEM HCL 30 MG PO TABS: ORAL_TABLET | ORAL | 3 refills | Status: AC

## 2023-03-31 NOTE — Patient Instructions (Signed)
Medication Instructions:  Your physician recommends that you continue on your current medications as directed. Please refer to the Current Medication list given to you today.  *If you need a refill on your cardiac medications before your next appointment, please call your pharmacy*   Lab Work: NONE If you have labs (blood work) drawn today and your tests are completely normal, you will receive your results only by: MyChart Message (if you have MyChart) OR A paper copy in the mail If you have any lab test that is abnormal or we need to change your treatment, we will call you to review the results.   Testing/Procedures: NONE   Follow-Up: At [facility], you and your health needs are our priority.  As part of our continuing mission to provide you with exceptional heart care, we have created designated Provider Care Teams.  These Care Teams include your primary Cardiologist (physician) and Advanced Practice Providers (APPs -  Physician Assistants and Nurse Practitioners) who all work together to provide you with the care you need, when you need it.  We recommend signing up for the patient portal called "MyChart".  Sign up information is provided on this After Visit Summary.  MyChart is used to connect with patients for Virtual Visits (Telemedicine).  Patients are able to view lab/test results, encounter notes, upcoming appointments, etc.  Non-urgent messages can be sent to your provider as well.   To learn more about what you can do with MyChart, go to [email or web address].    Your next appointment:   6 month(s)  Provider:   You may see Will Jorja Loa, MD Francis Dowse, PA-C Casimiro Needle [address] [place], [place] Sherie Don, NP Canary Brim, NP

## 2023-04-21 DIAGNOSIS — Z4689 Encounter for fitting and adjustment of other specified devices: Secondary | ICD-10-CM | POA: Diagnosis not present

## 2023-05-15 ENCOUNTER — Encounter: Payer: Self-pay | Admitting: Orthopedic Surgery

## 2023-05-15 ENCOUNTER — Ambulatory Visit: Payer: PPO | Admitting: Orthopedic Surgery

## 2023-05-15 DIAGNOSIS — G8929 Other chronic pain: Secondary | ICD-10-CM | POA: Diagnosis not present

## 2023-05-15 DIAGNOSIS — M25511 Pain in right shoulder: Secondary | ICD-10-CM | POA: Diagnosis not present

## 2023-05-15 MED ORDER — METHYLPREDNISOLONE ACETATE 40 MG/ML IJ SUSP
40.0000 mg | INTRAMUSCULAR | Status: AC | PRN
  Administered 2023-05-15: 40 mg via INTRA_ARTICULAR

## 2023-05-15 MED ORDER — LIDOCAINE HCL 1 % IJ SOLN
5.0000 mL | INTRAMUSCULAR | Status: AC | PRN
  Administered 2023-05-15: 5 mL

## 2023-05-15 NOTE — Progress Notes (Signed)
 Office Visit Note   Patient: Valerie Love           Date of Birth: 12-Sep-1955           MRN: [id number] Visit Date: 05/15/2023              Requested by: Ozell Heron HERO, MD [address] [place],  [place] [address] PCP: Ozell Heron HERO, MD  Chief Complaint  Patient presents with   Right Shoulder - Follow-up    Cortisone injection      HPI: Patient is a 68 year old woman who presents in follow-up for impingement symptoms right shoulder.  Patient states that the symptoms have progressed to the point where she cannot get her arm overhead or behind herself.  She states it is affecting her activities of daily living.  Assessment & Plan: Visit Diagnoses:  1. Chronic right shoulder pain     Plan: Right shoulder was injected in the subacromial space.  Follow-up as needed.  Follow-Up Instructions: Return if symptoms worsen or fail to improve.   Ortho Exam  Patient is alert, oriented, no adenopathy, well-dressed, normal affect, normal respiratory effort. Examination patient has decreased abduction and flexion of the right shoulder.  She has pain with Neer and Hawkins impingement test.  Imaging: No results found. No images are attached to the encounter.  Labs: Lab Results  Component Value Date   HGBA1C 5.6 06/22/2021   HGBA1C 5.5 08/28/2020   HGBA1C 5.6 06/10/2019   ESRSEDRATE 10 06/22/2021   ESRSEDRATE 17 04/18/2016   ESRSEDRATE 9 08/18/2010   CRP <1.0 06/22/2021   CRP 0.8 04/18/2016   GRAMSTAIN No WBC Seen 04/18/2016   GRAMSTAIN No Squamous Epithelial Cells Seen 04/18/2016   GRAMSTAIN No Organisms Seen 04/18/2016   LABORGA Few GROUP B STREP (S.AGALACTIAE) ISOLATED 04/18/2016     Lab Results  Component Value Date   ALBUMIN 4.1 10/25/2022   ALBUMIN 4.2 06/22/2021   ALBUMIN 3.9 08/28/2020    Lab Results  Component Value Date   MG 2.0 03/07/2023   MG 1.9 11/09/2021   MG 2.3 11/12/2018   No results found for: VD25OH  No results found  for: PREALBUMIN    Latest Ref Rng & Units 03/07/2023    5:42 PM 07/28/2022   10:40 AM 11/09/2021    7:42 PM  CBC EXTENDED  WBC 4.0 - 10.5 K/uL 8.5  4.9  6.6   RBC 3.87 - 5.11 MIL/uL 4.72  4.50  4.12   Hemoglobin 12.0 - 15.0 g/dL 85.2  86.0  86.8   HCT 36.0 - 46.0 % 43.6  43.2  38.6   Platelets 150 - 400 K/uL 274  258  225      There is no height or weight on file to calculate BMI.  Orders:  No orders of the defined types were placed in this encounter.  No orders of the defined types were placed in this encounter.    Procedures: Large Joint Inj: R subacromial bursa on 05/15/2023 1:34 PM Indications: diagnostic evaluation and pain Details: 22 G 1.5 in needle, posterior approach  Arthrogram: No  Medications: 5 mL lidocaine  1 %; 40 mg methylPREDNISolone  acetate 40 MG/ML Outcome: tolerated well, no immediate complications Procedure, treatment alternatives, risks and benefits explained, specific risks discussed. Consent was given by the patient. Immediately prior to procedure a time out was called to verify the correct patient, procedure, equipment, support staff and site/side marked as required. Patient was prepped and draped in the usual  sterile fashion.      Clinical Data: No additional findings.  ROS:  All other systems negative, except as noted in the HPI. Review of Systems  Objective: Vital Signs: There were no vitals taken for this visit.  Specialty Comments:  EXAM: MRI CERVICAL SPINE WITHOUT CONTRAST   TECHNIQUE: Multiplanar, multisequence MR imaging of the cervical spine was performed. No intravenous contrast was administered.   COMPARISON:  Radiographs of the cervical spine 03/16/2020 (images available, report unavailable).   FINDINGS: Mild intermittent motion degradation.   Alignment: Reversal of the expected cervical lordosis. 2 mm C2-C3 grade 1 anterolisthesis. 2-3 mm C3-C4 grade 1 anterolisthesis.   Vertebrae: Vertebral body height is maintained.  Mild degenerative endplate edema at R5-R4, C5-C6, C6-C7 and C7-T1. Marrow edema within the bilateral posterior elements at C2-C3 and on the left at C3-C4, likely degenerative and related to facet arthrosis.   Cord: No signal abnormality identified within the cervical spinal cord   Posterior Fossa, vertebral arteries, paraspinal tissues: No abnormality identified within included portions of the posterior fossa. Flow voids preserved within the imaged cervical vertebral arteries. Paraspinal soft tissues unremarkable.   Disc levels:   Multilevel disc space narrowing, greatest at C4-C5 (moderate), C5-C6 (moderate/advanced), C6-C7 (moderate/advanced) and C7-T1 (moderate/advanced).   C2-C3: Grade 1 anterolisthesis. Slight disc uncovering. Facet arthrosis on the right. No significant spinal canal or foraminal stenosis.   C3-C4: Grade 1 anterolisthesis. Slight disc uncovering and disc bulge. Uncovertebral hypertrophy on the left. Facet arthrosis (greater on the left and fairly advanced on the left). No significant spinal canal stenosis. Severe left neural foraminal narrowing.   C4-C5: Disc bulge. Endplate spurring with bilateral disc osteophyte ridge/uncinate hypertrophy. A disc bulge effaces the ventral thecal sac, contacting and minimally flattening the ventral spinal cord. However, the dorsal CSF space is maintained within the spinal canal. Bilateral neural foraminal narrowing (mild right, severe left).   C5-C6: Disc bilateral disc osteophyte ridge/uncinate hypertrophy. Mild-to-moderate spinal canal stenosis. The disc bulge contacts and minimally flattens the ventral aspect of the spinal cord. Bilateral neural foraminal narrowing (severe right, mild left).   C6-C7: Disc bulge. Bilateral disc osteophyte ridge/uncinate hypertrophy. Mild spinal canal stenosis (without significant spinal cord mass effect). Severe bilateral neural foraminal narrowing.   C7-T1: Disc bulge. Right-sided  disc osteophyte ridge. Facet arthrosis. No significant spinal canal stenosis. Mild right neural foraminal narrowing.   IMPRESSION: Cervical spondylosis, as outlined and with findings most notably as follows.   At C5-C6, there is moderate/advanced disc degeneration. Disc bulge. Bilateral disc osteophyte ridge/uncinate hypertrophy. Mild to moderate spinal canal narrowing. The disc bulge contacts and minimally flattens the ventral aspect of the spinal cord. Bilateral neural foraminal narrowing (severe right, mild left).   At C4-C5, there is moderate disc degeneration. Disc bulge with bilateral disc osteophyte ridge/uncinate hypertrophy. The disc bulge contacts and minimally flattens the ventral spinal cord. However, the dorsal CSF space is maintained within the spinal canal. Bilateral neural foraminal narrowing (mild right, severe left).   No more than mild spinal canal narrowing at the remaining levels. Additional sites of foraminal stenosis, as detailed and greatest on the left at C3-C4 (severe) and bilaterally at C6-C7 (severe).   Also of note, disc degeneration is moderate/advanced at C6-C7 and C7-T1.   Degenerative marrow edema as described.   Nonspecific reversal of expected cervical lordosis. Grade 1 anterolisthesis at C2-C3 and C3-C4.     Electronically Signed   By: Rockey Childs D.O.   On: 07/15/2021 07:41  PMFS History: Patient  Active Problem List   Diagnosis Date Noted   Asthma in adult 10/25/2022   Hypercoagulable state due to paroxysmal atrial fibrillation (HCC) 11/16/2021   Atrial fibrillation with RVR (HCC) 11/10/2021   Atrial fibrillation with rapid ventricular response (HCC) 11/09/2021   Acute systolic heart failure (HCC) 11/01/2018   NSTEMI (non-ST elevated myocardial infarction) (HCC) 10/30/2018   Hyperlipidemia 06/13/2018   Lactose intolerance 06/13/2018   S/P total knee arthroplasty, right 03/16/2016   Unilateral primary osteoarthritis, right knee     Special screening for malignant neoplasms, colon 12/29/2014   Diarrhea 12/29/2014   Medication management 05/13/2013   Does not have health insurance 05/13/2013   Adjustment reaction with anxiety and depression 03/12/2012   Medication monitoring encounter 03/12/2012   Hemorrhoids 08/13/2011   Perineal itching, female 08/13/2011   LBBB (left bundle branch block) 09/24/2010   Murmur 09/08/2010   EKG, abnormal 08/19/2010   Visit for preventive health examination 08/19/2010   Palpitations 08/18/2010   Dizziness 08/18/2010   Arthritis 08/18/2010   Anxiety state 06/18/2008   GRIEF REACTION 06/18/2008   DEPRESSION, MAJOR, RECURRENT 07/06/2006   ARTHRALGIA, UNSPECIFIED 07/06/2006   Past Medical History:  Diagnosis Date   Anxiety    Arthritis    R knee, back, hands    Cellulitis of knee 04/2016   RT KNEE   Cellulitis of right knee 04/18/2016   Childhood asthma    Complication of anesthesia    woke up slowly - 26yrs. ago   Depression    related to husband illness and death   History of jaundice    as a child     Hx of varicella    Hyperthyroidism    during pregnancy, treated & resolved post partum    IBS (irritable bowel syndrome)    LBBB (left bundle branch block)    Scoliosis    Scoliosis 07/2015   Thyroid  disease in pregnancy    TOBACCO DEPENDENCE 07/06/2006   Qualifier: Diagnosis of  By: Manford Longs     Troponin level elevated     Family History  Problem Relation Age of Onset   Alcohol abuse Mother        died from stomach ulcers   Stroke Father        died  from cva and mi   Hypertension Father    Hyperlipidemia Father    Coronary artery disease Father        Age 27   Colon polyps Sister    Hepatitis C Brother    Colon cancer Neg Hx     Past Surgical History:  Procedure Laterality Date   ATRIAL FIBRILLATION ABLATION N/A 08/11/2022   Procedure: ATRIAL FIBRILLATION ABLATION;  Surgeon: Inocencio Soyla Lunger, MD;  Location: [facility];  Service:  Cardiovascular;  Laterality: N/A;   BREAST BIOPSY Right 1979   benign    Cyst removed from ovary     KNEE SURGERY     right Lynnley Doddridge    LEFT HEART CATH AND CORONARY ANGIOGRAPHY N/A 10/31/2018   Procedure: LEFT HEART CATH AND CORONARY ANGIOGRAPHY;  Surgeon: Burnard Debby LABOR, MD;  Location: [facility];  Service: Cardiovascular;  Laterality: N/A;   TONSILLECTOMY AND ADENOIDECTOMY  1073   TOTAL KNEE ARTHROPLASTY 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   Procedure: TOTAL KNEE ARTHROPLASTY;  Surgeon: Jerona LULLA Sage, MD;  Location: [facility];  Service: Orthopedics;  Laterality: Right;   TUBAL LIGATION     WISDOM TOOTH EXTRACTION     Social History  Occupational History   Occupation: self    CommentScientist, Research (medical)  Tobacco Use   Smoking status: Former    Current packs/day: 0.00    Average packs/day: 0.3 packs/day for 40.0 years (10.0 ttl pk-yrs)    Types: Cigarettes    Start date: 01/06/1976    Quit date: 01/06/2016    Years since quitting: 7.3   Smokeless tobacco: Never   Tobacco comments:    Former smoker 11/16/21  Substance and Sexual Activity   Alcohol use: Yes    Alcohol/week: 7.0 standard drinks of alcohol    Types: 7 Glasses of wine per week    Comment: 1 glass of wine daily 11/16/21   Drug use: No   Sexual activity: Not on file

## 2023-06-05 ENCOUNTER — Encounter (HOSPITAL_COMMUNITY): Payer: Self-pay | Admitting: Physician Assistant

## 2023-06-05 ENCOUNTER — Ambulatory Visit (HOSPITAL_COMMUNITY)
Admission: RE | Admit: 2023-06-05 | Discharge: 2023-06-05 | Disposition: A | Discharge status: Home / Self Care | Payer: PPO | Source: Ambulatory Visit | Attending: Physician Assistant | Admitting: Physician Assistant

## 2023-06-05 VITALS — BP 132/78 | HR 61 | Ht 62.0 in | Wt 146.0 lb

## 2023-06-05 DIAGNOSIS — I480 Paroxysmal atrial fibrillation: Secondary | ICD-10-CM | POA: Diagnosis not present

## 2023-06-05 DIAGNOSIS — I4891 Unspecified atrial fibrillation: Secondary | ICD-10-CM | POA: Diagnosis present

## 2023-06-05 DIAGNOSIS — E785 Hyperlipidemia, unspecified: Secondary | ICD-10-CM | POA: Diagnosis not present

## 2023-06-05 DIAGNOSIS — Z7901 Long term (current) use of anticoagulants: Secondary | ICD-10-CM | POA: Insufficient documentation

## 2023-06-05 DIAGNOSIS — I252 Old myocardial infarction: Secondary | ICD-10-CM | POA: Insufficient documentation

## 2023-06-05 DIAGNOSIS — I5020 Unspecified systolic (congestive) heart failure: Secondary | ICD-10-CM | POA: Diagnosis not present

## 2023-06-05 DIAGNOSIS — R9431 Abnormal electrocardiogram [ECG] [EKG]: Secondary | ICD-10-CM | POA: Insufficient documentation

## 2023-06-05 DIAGNOSIS — I471 Supraventricular tachycardia, unspecified: Secondary | ICD-10-CM | POA: Diagnosis not present

## 2023-06-05 NOTE — Progress Notes (Signed)
Primary Care Physician: Karie Georges, MD Primary Cardiologist: Dr Allyson Sabal Primary Electrophysiologist: Dr Elberta Fortis Referring Physician: Dr [place]   Valerie Love is a 68 y.o. female with a history of LV dysfunction, NSTEMI w/ normal coronaries 2020, HLD, atrial fibrillation who presents for follow up in the [facility].  The patient was initially diagnosed with atrial fibrillation 11/09/21 after presenting with symptoms of heart racing, weakness, and jaw discomfort. She went to the fire department down the street where she was found to be in atrial fibrillation with rate as high as 200 and was brought to our ED via EMS.  She was given a bolus of diltiazem and started on an infusion which controlled her rates.  Her acute symptoms resolved with rate control. She spontaneously converted to SR and was discharged on diltiazem and Eliquis for a CHADS2VASC score of 3.   Patient is s/p afib ablation with Dr Elberta Fortis on 08/11/22.   Patient returns for follow up for atrial fibrillation. She was seen that the ED 02/2023 for SVT which converted with amiodarone and vagal maneuvers. Patient has not had any symptoms of atrial fibrillation. She had discontinued diltiazem and Eliquis briefly post ablation but has now resumed both. No bleeding issues on anticoagulation.   Today, he denies symptoms of palpitations, chest pain, orthopnea, PND, lower extremity edema, dizziness, presyncope, syncope, snoring, daytime somnolence, bleeding, or neurologic sequela. The patient is tolerating medications without difficulties and is otherwise without complaint today.    Atrial Fibrillation Risk Factors:  she does not have symptoms or diagnosis of sleep apnea. she does not have a history of rheumatic fever. she does have a history of alcohol use. The patient does not have a history of early familial atrial fibrillation or other arrhythmias.   Atrial Fibrillation Management  history:  Previous antiarrhythmic drugs: none Previous cardioversions: none Previous ablations: 08/11/22 Anticoagulation history: Eliquis   Past Medical History:  Diagnosis Date   Anxiety    Arthritis    R knee, back, hands    Cellulitis of knee 04/2016   RT KNEE   Cellulitis of right knee 04/18/2016   Childhood asthma    Complication of anesthesia    woke up slowly - 46yrs. ago   Depression    related to husband illness and death   History of jaundice    as a child     Hx of varicella    Hyperthyroidism    during pregnancy, treated & resolved post partum    IBS (irritable bowel syndrome)    LBBB (left bundle branch block)    Scoliosis    Scoliosis 07/2015   Thyroid disease in pregnancy    TOBACCO DEPENDENCE 07/06/2006   Qualifier: Diagnosis of  By: Knox Royalty     Troponin level elevated     Current Outpatient Medications  Medication Sig Dispense Refill   acetaminophen (TYLENOL) 650 MG CR tablet Take 1,000 mg by mouth 2 (two) times daily.     albuterol (VENTOLIN HFA) 108 (90 Base) MCG/ACT inhaler INHALE 2 PUFFS BY MOUTH EVERY 6 HOURS AS NEEDED FOR WHEEZING FOR SHORTNESS OF BREATH 18 g 0   ALPRAZolam (XANAX) 0.5 MG tablet TAKE 1 TABLET BY MOUTH THREE TIMES A DAY AS NEEDED FOR ANXIETY. MAXIMUM 3 TALBETS PER 24 HOURS 30 tablet 0   apixaban (ELIQUIS) 5 MG TABS tablet Take 1 tablet (5 mg total) by mouth 2 (two) times daily. 180 tablet 3   Calcium Carb-Cholecalciferol (CALCIUM+D3)  600-800 MG-UNIT TABS Take 1 tablet by mouth daily with breakfast.     citalopram (CELEXA) 20 MG tablet Take 1.5 tablets (30 mg total) by mouth daily. 135 tablet 1   diltiazem (CARDIZEM CD) 180 MG 24 hr capsule Take 1 capsule (180 mg total) by mouth daily. 30 capsule 11   diltiazem (CARDIZEM) 30 MG tablet Take one tablet by mouth every 4 hours as needed for HR greater than 100, top number B/P needs to be above 100 45 tablet 3   diphenhydrAMINE (BENADRYL) 25 MG tablet Take 25 mg by mouth at bedtime as  needed for sleep or itching.     Multiple Vitamins-Minerals (ALIVE ONCE DAILY WOMENS 50+) TABS Take 1 tablet by mouth daily.     Probiotic Product (TRUBIOTICS) CAPS Take 1 capsule by mouth daily.     rosuvastatin (CRESTOR) 5 MG tablet Take 1 tablet by mouth once daily 90 tablet 3   No current facility-administered medications for this encounter.    ROS- All systems are reviewed and negative except as per the HPI above.  Physical Exam: Vitals:   06/05/23 1003  BP: 132/78  Pulse: 61  Weight: 66.2 kg  Height: 5\' 2"  (1.575 m)     GEN: Well nourished, well developed in no acute distress CARDIAC: Regular rate and rhythm, no murmurs, rubs, gallops RESPIRATORY:  Clear to auscultation without rales, wheezing or rhonchi  ABDOMEN: Soft, non-tender, non-distended EXTREMITIES:  No edema; No deformity    Wt Readings from Last 3 Encounters:  06/05/23 66.2 kg  03/31/23 67.4 kg  03/28/23 66.7 kg    EKG today demonstrates  SR, LBBB Vent. rate 61 BPM PR interval 156 ms QRS duration 142 ms QT/QTcB 492/495 ms   Echo 11/10/21 demonstrated   1. Left ventricular ejection fraction, by estimation, is 55 to 60%. The  left ventricle has normal function. The left ventricle has no regional  wall motion abnormalities. Left ventricular diastolic parameters are  consistent with Grade I diastolic dysfunction (impaired relaxation).   2. Right ventricular systolic function is normal. The right ventricular  size is normal. There is normal pulmonary artery systolic pressure.   3. The mitral valve is normal in structure. Mild mitral valve  regurgitation. No evidence of mitral stenosis.   4. The aortic valve is normal in structure. Aortic valve regurgitation is mild. No aortic stenosis is present.   5. The inferior vena cava is dilated in size with >50% respiratory  variability, suggesting right atrial pressure of 8 mmHg.   Epic records are reviewed at length today  CHA2DS2-VASc Score = 2  The  patient's score is based upon: CHF History: 0 HTN History: 0 Diabetes History: 0 Stroke History: 0 Vascular Disease History: 0 Age Score: 1 Gender Score: 1       ASSESSMENT AND PLAN: Paroxysmal Atrial Fibrillation (ICD10:  I48.0) The patient's CHA2DS2-VASc score is 2, indicating a 2.2% annual risk of stroke.   S/p ablation 08/11/22 Patient appears to be maintaining SR Continue Eliquis 5 mg BID Continue diltiazem 180 mg daily with 30 mg PRN q 4 hours for heart racing  HFrecEF EF normalized Fluid status appears stable  SVT Continue diltiazem as above.   Follow up with Dr Elberta Fortis per recall.    Jorja Loa PA-C [facility] [address] [place], [place] [address] [phone] 06/05/2023 11:08 AM

## 2023-07-04 ENCOUNTER — Other Ambulatory Visit (HOSPITAL_BASED_OUTPATIENT_CLINIC_OR_DEPARTMENT_OTHER): Payer: Self-pay

## 2023-07-11 ENCOUNTER — Ambulatory Visit (INDEPENDENT_AMBULATORY_CARE_PROVIDER_SITE_OTHER): Admitting: Family Medicine

## 2023-07-11 ENCOUNTER — Ambulatory Visit: Payer: Self-pay | Admitting: Family Medicine

## 2023-07-11 ENCOUNTER — Encounter: Payer: Self-pay | Admitting: Family Medicine

## 2023-07-11 VITALS — BP 128/70 | HR 78 | Temp 97.9°F | Ht 62.0 in | Wt 146.7 lb

## 2023-07-11 DIAGNOSIS — R062 Wheezing: Secondary | ICD-10-CM | POA: Diagnosis not present

## 2023-07-11 DIAGNOSIS — R052 Subacute cough: Secondary | ICD-10-CM | POA: Diagnosis not present

## 2023-07-11 DIAGNOSIS — Z4689 Encounter for fitting and adjustment of other specified devices: Secondary | ICD-10-CM | POA: Diagnosis not present

## 2023-07-11 MED ORDER — AMOXICILLIN-POT CLAVULANATE 875-125 MG PO TABS
1.0000 | ORAL_TABLET | Freq: Two times a day (BID) | ORAL | 0 refills | Status: DC
Start: 2023-07-11 — End: 2023-07-26

## 2023-07-11 MED ORDER — PREDNISONE 20 MG PO TABS: ORAL_TABLET | ORAL | 0 refills | Status: DC

## 2023-07-11 NOTE — Telephone Encounter (Signed)
 Patient was scheduled for an appt today at 11:45am with Dr Caryl Never.

## 2023-07-11 NOTE — Progress Notes (Signed)
 Established Patient Office Visit  Subjective   Patient ID: Valerie Love, female    DOB: [birth year]  Age: 68 y.o. MRN: [id number]  Chief Complaint  Patient presents with   Cough   Headache    Started 4 weeks    Wheezing    HPI   Valerie Love is a 68 year old female who is seen today with onset of productive cough and intermittent headaches about 4 weeks ago.  Valerie Love has had some frequent frontal and maxillary sinus pressure.  Very little nasal discharge.  Cough occasionally productive of yellow-tinged mucus.  Valerie Love feels that Valerie Love does have some wheezing.  No fever.  No chills.  Some increased dyspnea above baseline.  Quit smoking 7 years ago.  Portable chest x-ray back in October no acute findings.  CT morphology study of the heart back last March showed emphysema changes.  Valerie Love does have albuterol inhaler which Valerie Love uses periodically.  No history of pulmonary function testing to her knowledge.  Valerie Love does have a history of atrial fibrillation and takes Eliquis and Cardizem.  Her A-fib is intermittent  Past Medical History:  Diagnosis Date   Anxiety    Arthritis    R knee, back, hands    Cellulitis of knee 04/2016   RT KNEE   Cellulitis of right knee 04/18/2016   Childhood asthma    Complication of anesthesia    woke up slowly - 85yrs. ago   Depression    related to husband illness and death   History of jaundice    as a child     Hx of varicella    Hyperthyroidism    during pregnancy, treated & resolved post partum    IBS (irritable bowel syndrome)    LBBB (left bundle branch block)    Scoliosis    Scoliosis 07/2015   Thyroid disease in pregnancy    TOBACCO DEPENDENCE 07/06/2006   Qualifier: Diagnosis of  By: Knox Royalty     Troponin level elevated    Past Surgical History:  Procedure Laterality Date   ATRIAL FIBRILLATION ABLATION N/A 08/11/2022   Procedure: ATRIAL FIBRILLATION ABLATION;  Surgeon: Regan Lemming, MD;  Location: [facility];  Service:  Cardiovascular;  Laterality: N/A;   BREAST BIOPSY Right 1979   benign    Cyst removed from ovary     KNEE SURGERY     right duda    LEFT HEART CATH AND CORONARY ANGIOGRAPHY N/A 10/31/2018   Procedure: LEFT HEART CATH AND CORONARY ANGIOGRAPHY;  Surgeon: Lennette Bihari, MD;  Location: [facility];  Service: Cardiovascular;  Laterality: N/A;   TONSILLECTOMY AND ADENOIDECTOMY  1073   TOTAL KNEE ARTHROPLASTY 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   Procedure: TOTAL KNEE ARTHROPLASTY;  Surgeon: [name], MD;  Location: [facility];  Service: Orthopedics;  Laterality: Right;   TUBAL LIGATION     WISDOM TOOTH EXTRACTION      reports that Valerie Love quit smoking about 7 years ago. Her smoking use included cigarettes. Valerie Love started smoking about 47 years ago. Valerie Love has a 10 pack-year smoking history. Valerie Love has never used smokeless tobacco. Valerie Love reports current alcohol use of about 7.0 standard drinks of alcohol per week. Valerie Love reports that Valerie Love does not use drugs. family history includes Alcohol abuse in her mother; Colon polyps in her sister; Coronary artery disease in her father; Hepatitis C in her brother; Hyperlipidemia in her father; Hypertension in her father; Stroke in her father. Allergies  Allergen Reactions  Codeine Itching, Swelling, Rash and Other (See Comments)    Hives and Vomiting   Lactose Intolerance (Gi) Diarrhea    Review of Systems  Constitutional:  Negative for chills, fever and weight loss.  HENT:  Positive for sinus pain.   Respiratory:  Positive for cough, sputum production, shortness of breath and wheezing. Negative for hemoptysis.   Cardiovascular:  Negative for chest pain.      Objective:     BP 128/70   Pulse 78   Temp 97.9 F (36.6 C) (Oral)   Ht 5\' 2"  (1.575 m)   Wt 146 lb 11.2 oz (66.5 kg)   SpO2 97%   BMI 26.83 kg/m  BP Readings from Last 3 Encounters:  07/11/23 128/70  06/05/23 132/78  03/31/23 130/60   Wt Readings from Last 3 Encounters:  07/11/23 146 lb 11.2 oz (66.5 kg)   06/05/23 146 lb (66.2 kg)  03/31/23 148 lb 9.6 oz (67.4 kg)      Physical Exam Vitals reviewed.  Constitutional:      General: Valerie Love is not in acute distress.    Appearance: Valerie Love is not ill-appearing.  Cardiovascular:     Rate and Rhythm: Normal rate and regular rhythm.  Pulmonary:     Comments: Somewhat diminished breath sounds throughout.  No rales.  Valerie Love does have a few scattered wheezes. Musculoskeletal:     Cervical back: Neck supple.  Neurological:     Mental Status: Valerie Love is alert.      No results found for any visits on 07/11/23.    The ASCVD Risk score (Arnett DK, et al., 2019) failed to calculate for the following reasons:   Risk score cannot be calculated because patient has a medical history suggesting prior/existing ASCVD    Assessment & Plan:   68 year old female seen with 4-week history of intermittent productive cough, mild wheezing, and persistent sinusitis symptoms.  Does not have any red flags such as weight loss, hemoptysis, fever.  Quit smoking 7 years ago.  Recommend:  -Continue albuterol inhaler as needed -Start prednisone 20 mg 2 tablets daily for 6 days -Start Augmentin 875 mg twice daily for 10 days to cover for likely sinusitis -Follow-up with Dr. Casimiro Needle if cough not improving over the next couple weeks -We did discuss the fact that Valerie Love has likely chronic lung disease related to years of smoking.  Did have some emphysema changes on recent CT scan as above.  Consider pulmonary referral/PFTs if symptoms above not improving   Evelena Peat, MD

## 2023-07-11 NOTE — Telephone Encounter (Signed)
 Copied from CRM [id number]. Topic: Clinical - Red Word Triage >> Jul 11, 2023  8:29 AM Adele Barthel wrote: Red Word that prompted transfer to Nurse Triage:   Left ear pain Headaches Fatigue Cough Wheezing occasionally, has inhaler is beneficial Started a month ago, intermittenly Has not tried cough suppressant, has history of Afib  Chief Complaint: Cough Symptoms: Sinus headache, mild SOB, wheezing Frequency: A month, sputum changed color yesterday Pertinent Negatives: Patient denies fever Disposition: [] ED /[] Urgent Care (no appt availability in office) / [x] Appointment(In office/virtual)/ []  [facility]/ [] Home Care/ [] Refused Recommended Disposition /[] [facility]/ []  Follow-up with PCP Additional Notes: Patient called in to report a cough and cold symptoms that have been ongoing for a month. Patient stated she is experiencing mild SOB when coughing and walking around. Patient denied SOB at rest. Patient able to speak in clear and complete sentences while on the phone with this RN. No audible work of breathing detected. Patient denied fever. This RN advised patient to see a provider within 4 hours. No availability with PCP. Schedule patient in office with an alternate provider, within time frame. This RN advised patient to call back if symptoms worsen. Patient complied.   Reason for Disposition  [1] MILD difficulty breathing (e.g., minimal/no SOB at rest, SOB with walking, pulse <100) AND [2] still present when not coughing  Answer Assessment - Initial Assessment Questions 1. ONSET: "When did the cough begin?"      A month ago 2. SEVERITY: "How bad is the cough today?"      Coughing spells in the morning, uses inhaler and cough improves 3. SPUTUM: "Describe the color of your sputum" (none, dry cough; clear, white, yellow, green)     Was clear, but turned yellow within last day or so 4. HEMOPTYSIS: "Are you coughing up any blood?" If so ask: "How much?" (flecks,  streaks, tablespoons, etc.)     Denies 5. DIFFICULTY BREATHING: "Are you having difficulty breathing?" If Yes, ask: "How bad is it?" (e.g., mild, moderate, severe)    - MILD: No SOB at rest, mild SOB with walking, speaks normally in sentences, can lie down, no retractions, pulse < 100.    - MODERATE: SOB at rest, SOB with minimal exertion and prefers to sit, cannot lie down flat, speaks in phrases, mild retractions, audible wheezing, pulse 100-120.    - SEVERE: Very SOB at rest, speaks in single words, struggling to breathe, sitting hunched forward, retractions, pulse > 120      SOB after coughing spells, SOB when walking, denies SOB at rest 6. FEVER: "Do you have a fever?" If Yes, ask: "What is your temperature, how was it measured, and when did it start?"     Denies 7. CARDIAC HISTORY: "Do you have any history of heart disease?" (e.g., heart attack, congestive heart failure)      Afib, SVT 8. LUNG HISTORY: "Do you have any history of lung disease?"  (e.g., pulmonary embolus, asthma, emphysema)     Asthma 10. OTHER SYMPTOMS: "Do you have any other symptoms?" (e.g., runny nose, wheezing, chest pain)     Sinus headache, faint wheezing, denies dizziness, denies NV  Protocols used: Cough - Acute Productive-A-AH

## 2023-07-11 NOTE — Patient Instructions (Signed)
 Follow up with Dr Casimiro Needle if cough not improving in couple of weeks.

## 2023-07-11 NOTE — Telephone Encounter (Signed)
 Noted- ok to close.

## 2023-07-26 ENCOUNTER — Ambulatory Visit: Payer: Self-pay | Admitting: Family Medicine

## 2023-07-26 ENCOUNTER — Encounter: Payer: Self-pay | Admitting: Family Medicine

## 2023-07-26 ENCOUNTER — Telehealth (INDEPENDENT_AMBULATORY_CARE_PROVIDER_SITE_OTHER): Admitting: Family Medicine

## 2023-07-26 VITALS — HR 75 | Temp 97.8°F

## 2023-07-26 DIAGNOSIS — J069 Acute upper respiratory infection, unspecified: Secondary | ICD-10-CM

## 2023-07-26 NOTE — Telephone Encounter (Signed)
   Chief Complaint: Cough Symptoms: Cough, sinus pressure, sneezing Pertinent Negatives: Patient denies fever, SOB Disposition: [] ED /[] Urgent Care (no appt availability in office) / [] Appointment(In office/virtual)/ []  [facility]/ [x] Home Care/ [] Refused Recommended Disposition /[] [facility]/ []  Follow-up with PCP Additional Notes: Patient stated that she has a cough, sinus pressure/pain, congestion, and sneezing. Her symptoms just started today. Patient suspects that it may be due to allergies. She is doing home care. She stated that she has Coricidin HBP but she doesn't think that it will address all of her symptoms. She mentioned that there are certain medications that she shouldn't take due to her hx of A-fib. She wants to use an OTC medication to help with her sinuses. Can the provider recommend a specific medication that is safe to take with A-fib?    Reason for Disposition  Cough with cold symptoms (e.g., runny nose, postnasal drip, throat clearing)  Answer Assessment - Initial Assessment Questions 1. ONSET: "When did the cough begin?"     Today      2. SPUTUM: "Describe the color of your sputum" (none, dry cough; clear, white, yellow, green)     Yellow  3. DIFFICULTY BREATHING: "Are you having difficulty breathing?" If Yes, ask: "How bad is it?" (e.g., mild, moderate, severe)    - MILD: No SOB at rest, mild SOB with walking, speaks normally in sentences, can lie down, no retractions, pulse < 100.    - MODERATE: SOB at rest, SOB with minimal exertion and prefers to sit, cannot lie down flat, speaks in phrases, mild retractions, audible wheezing, pulse 100-120.    - SEVERE: Very SOB at rest, speaks in single words, struggling to breathe, sitting hunched forward, retractions, pulse > 120      No  4. FEVER: "Do you have a fever?" If Yes, ask: "What is your temperature, how was it measured, and when did it start?"     No      5. OTHER SYMPTOMS: "Do you have  any other symptoms?" (e.g., runny nose, wheezing, chest pain)       Clear nasal drainage, sinus pain around forehead and eyes 5/10, itch in the ears earlier this morning  Protocols used: Cough - Acute Productive-A-AH

## 2023-07-26 NOTE — Telephone Encounter (Signed)
 Spoke with the patient and scheduled a visit today with Dr Caryl Never as PCP does not have any openings.

## 2023-07-26 NOTE — Progress Notes (Signed)
 Patient ID: Valerie Love, female   DOB: January 07, 1956, 68 y.o.   MRN: [id number]   Virtual Visit via Video Note  I connected with Valerie Love on 07/26/23 at  5:00 PM EDT by a video enabled telemedicine application and verified that I am speaking with the correct person using two identifiers.  Location patient: home Location provider:work or home office Persons participating in the virtual visit: patient, provider  I discussed the limitations of evaluation and management by telemedicine and the availability of in person appointments. The patient expressed understanding and agreed to proceed.   HPI:  Valerie Love was seen here March 4 with several week history of sinusitis symptoms and some wheezing on exam.  She was treated with prednisone and Augmentin and did improve significantly.  She now is seen as a work-in virtually with 1 day history of headache, rhinorrhea, cough.  No known sick contacts.  She wonders if she may have picked up something at church Sunday.  No documented fever.  Current temperature 97.7.  She has had some bodyaches.  Not aware of any wheezing.  Has obtained some over-the-counter Coricidin.  Denies any nausea, vomiting, or diarrhea.   ROS: See pertinent positives and negatives per HPI.  Past Medical History:  Diagnosis Date   Anxiety    Arthritis    R knee, back, hands    Cellulitis of knee 04/2016   RT KNEE   Cellulitis of right knee 04/18/2016   Childhood asthma    Complication of anesthesia    woke up slowly - 51yrs. ago   Depression    related to husband illness and death   History of jaundice    as a child     Hx of varicella    Hyperthyroidism    during pregnancy, treated & resolved post partum    IBS (irritable bowel syndrome)    LBBB (left bundle branch block)    Scoliosis    Scoliosis 07/2015   Thyroid disease in pregnancy    TOBACCO DEPENDENCE 07/06/2006   Qualifier: Diagnosis of  By: Knox Royalty     Troponin level elevated     Past Surgical  History:  Procedure Laterality Date   ATRIAL FIBRILLATION ABLATION N/A 08/11/2022   Procedure: ATRIAL FIBRILLATION ABLATION;  Surgeon: Regan Lemming, MD;  Location: [facility];  Service: Cardiovascular;  Laterality: N/A;   BREAST BIOPSY Right 1979   benign    Cyst removed from ovary     KNEE SURGERY     right duda    LEFT HEART CATH AND CORONARY ANGIOGRAPHY N/A 10/31/2018   Procedure: LEFT HEART CATH AND CORONARY ANGIOGRAPHY;  Surgeon: Lennette Bihari, MD;  Location: [facility];  Service: Cardiovascular;  Laterality: N/A;   TONSILLECTOMY AND ADENOIDECTOMY  1073   TOTAL KNEE ARTHROPLASTY 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   Procedure: TOTAL KNEE ARTHROPLASTY;  Surgeon: [name], MD;  Location: [facility] OR;  Service: Orthopedics;  Laterality: Right;   TUBAL LIGATION     WISDOM TOOTH EXTRACTION      Family History  Problem Relation Age of Onset   Alcohol abuse Mother        died from stomach ulcers   Stroke Father        died  from cva and mi   Hypertension Father    Hyperlipidemia Father    Coronary artery disease Father        Age 13   Colon polyps Sister    Hepatitis  C Brother    Colon cancer Neg Hx     SOCIAL HX: Quit smoking 2017   Current Outpatient Medications:    acetaminophen (TYLENOL) 650 MG CR tablet, Take 1,000 mg by mouth 2 (two) times daily., Disp: , Rfl:    albuterol (VENTOLIN HFA) 108 (90 Base) MCG/ACT inhaler, INHALE 2 PUFFS BY MOUTH EVERY 6 HOURS AS NEEDED FOR WHEEZING FOR SHORTNESS OF BREATH, Disp: 18 g, Rfl: 0   ALPRAZolam (XANAX) 0.5 MG tablet, TAKE 1 TABLET BY MOUTH THREE TIMES A DAY AS NEEDED FOR ANXIETY. MAXIMUM 3 TALBETS PER 24 HOURS, Disp: 30 tablet, Rfl: 0   apixaban (ELIQUIS) 5 MG TABS tablet, Take 1 tablet (5 mg total) by mouth 2 (two) times daily., Disp: 180 tablet, Rfl: 3   Calcium Carb-Cholecalciferol (CALCIUM+D3) 600-800 MG-UNIT TABS, Take 1 tablet by mouth daily with breakfast., Disp: , Rfl:    citalopram (CELEXA) 20 MG tablet, Take 1.5 tablets  (30 mg total) by mouth daily., Disp: 135 tablet, Rfl: 1   diltiazem (CARDIZEM CD) 180 MG 24 hr capsule, Take 1 capsule (180 mg total) by mouth daily., Disp: 30 capsule, Rfl: 11   diltiazem (CARDIZEM) 30 MG tablet, Take one tablet by mouth every 4 hours as needed for HR greater than 100, top number B/P needs to be above 100, Disp: 45 tablet, Rfl: 3   diphenhydrAMINE (BENADRYL) 25 MG tablet, Take 25 mg by mouth at bedtime as needed for sleep or itching., Disp: , Rfl:    Multiple Vitamins-Minerals (ALIVE ONCE DAILY WOMENS 50+) TABS, Take 1 tablet by mouth daily., Disp: , Rfl:    Probiotic Product (TRUBIOTICS) CAPS, Take 1 capsule by mouth daily., Disp: , Rfl:    rosuvastatin (CRESTOR) 5 MG tablet, Take 1 tablet by mouth once daily, Disp: 90 tablet, Rfl: 3  EXAM:  VITALS per patient if applicable:  GENERAL: alert, oriented, appears well and in no acute distress  HEENT: atraumatic, conjunttiva clear, no obvious abnormalities on inspection of external nose and ears  NECK: normal movements of the head and neck  LUNGS: on inspection no signs of respiratory distress, breathing rate appears normal, no obvious gross SOB, gasping or wheezing  CV: no obvious cyanosis  MS: moves all visible extremities without noticeable abnormality  PSYCH/NEURO: pleasant and cooperative, no obvious depression or anxiety, speech and thought processing grossly intact  ASSESSMENT AND PLAN:  Discussed the following assessment and plan:  Probable viral URI with cough.  Patient not aware of any wheezing.  No respiratory distress.  Afebrile.  Sounds very likely viral.  Recommend plenty of fluids and rest.  She will try over-the-counter Coricidin and plain Mucinex.  Tylenol as needed for headaches and bodyaches.  Follow-up promptly for any fever or any worsening symptoms otherwise     I discussed the assessment and treatment plan with the patient. The patient was provided an opportunity to ask questions and all were  answered. The patient agreed with the plan and demonstrated an understanding of the instructions.   The patient was advised to call back or seek an in-person evaluation if the symptoms worsen or if the condition fails to improve as anticipated.     Evelena Peat, MD

## 2023-09-04 DIAGNOSIS — N8110 Cystocele, unspecified: Secondary | ICD-10-CM | POA: Diagnosis not present

## 2023-09-04 DIAGNOSIS — N8111 Cystocele, midline: Secondary | ICD-10-CM | POA: Diagnosis not present

## 2023-09-11 ENCOUNTER — Other Ambulatory Visit: Payer: Self-pay | Admitting: Family Medicine

## 2023-09-11 DIAGNOSIS — Z1231 Encounter for screening mammogram for malignant neoplasm of breast: Secondary | ICD-10-CM

## 2023-09-22 ENCOUNTER — Ambulatory Visit

## 2023-09-22 VITALS — Ht 62.0 in | Wt 146.0 lb

## 2023-09-22 DIAGNOSIS — Z0000 Encounter for general adult medical examination without abnormal findings: Secondary | ICD-10-CM | POA: Diagnosis not present

## 2023-09-22 NOTE — Progress Notes (Signed)
 Subjective:   Valerie Love is a 68 y.o. who presents for a Medicare Wellness preventive visit.  As a reminder, Annual Wellness Visits don't include a physical exam, and some assessments may be limited, especially if this visit is performed virtually. We may recommend an in-person follow-up visit with your provider if needed.  Visit Complete: Virtual I connected with  Valerie Love on 09/22/23 by a audio enabled telemedicine application and verified that I am speaking with the correct person using two identifiers.  Patient Location: Home  Provider Location: Home Office  I discussed the limitations of evaluation and management by telemedicine. The patient expressed understanding and agreed to proceed.  Vital Signs: Because this visit was a virtual/telehealth visit, some criteria may be missing or patient reported. Any vitals not documented were not able to be obtained and vitals that have been documented are patient reported.    Persons Participating in Visit: Patient.  AWV Questionnaire: No: Patient Medicare AWV questionnaire was not completed prior to this visit.  Cardiac Risk Factors include: advanced age (>45men, >63 women)     Objective:     Today's Vitals   09/22/23 1010  Weight: 146 lb (66.2 kg)  Height: 5\' 2"  (1.575 m)   Body mass index is 26.7 kg/m.     09/22/2023   10:15 AM 08/26/2022    9:09 AM 08/15/2022    2:59 PM 08/11/2022    6:17 AM 06/21/2022   11:15 AM 11/10/2021    9:00 PM 08/11/2021    3:05 PM  Advanced Directives  Does Patient Have a Medical Advance Directive? No Yes Yes Yes Yes No Yes  Type of Special educational needs teacher of [place];Living will Healthcare Power of [place];Living will Healthcare Power of [place];Living will Healthcare Power of [place];Living will;Out of facility DNR (pink MOST or yellow form)  Living will;Healthcare Power of Attorney  Does patient want to make changes to medical advance directive?    No - Patient  declined   No - Patient declined  Copy of Healthcare Power of Attorney in Chart?   No - copy requested Yes - validated most recent copy scanned in chart (See row information) No - copy requested  No - copy requested  Would patient like information on creating a medical advance directive? No - Patient declined     No - Patient declined     Current Medications (verified) Outpatient Encounter Medications as of 09/22/2023  Medication Sig   acetaminophen  (TYLENOL ) 650 MG CR tablet Take 1,000 mg by mouth 2 (two) times daily.   albuterol  (VENTOLIN  HFA) 108 (90 Base) MCG/ACT inhaler INHALE 2 PUFFS BY MOUTH EVERY 6 HOURS AS NEEDED FOR WHEEZING FOR SHORTNESS OF BREATH   ALPRAZolam  (XANAX ) 0.5 MG tablet TAKE 1 TABLET BY MOUTH THREE TIMES A DAY AS NEEDED FOR ANXIETY. MAXIMUM 3 TALBETS PER 24 HOURS   apixaban  (ELIQUIS ) 5 MG TABS tablet Take 1 tablet (5 mg total) by mouth 2 (two) times daily.   Calcium  Carb-Cholecalciferol (CALCIUM +D3) 600-800 MG-UNIT TABS Take 1 tablet by mouth daily with breakfast.   citalopram  (CELEXA ) 20 MG tablet Take 1.5 tablets (30 mg total) by mouth daily.   diltiazem  (CARDIZEM  CD) 180 MG 24 hr capsule Take 1 capsule (180 mg total) by mouth daily.   diltiazem  (CARDIZEM ) 30 MG tablet Take one tablet by mouth every 4 hours as needed for HR greater than 100, top number B/P needs to be above 100   diphenhydrAMINE  (BENADRYL ) 25 MG tablet  Take 25 mg by mouth at bedtime as needed for sleep or itching.   Multiple Vitamins-Minerals (ALIVE ONCE DAILY WOMENS 50+) TABS Take 1 tablet by mouth daily.   Probiotic Product (TRUBIOTICS) CAPS Take 1 capsule by mouth daily.   rosuvastatin  (CRESTOR ) 5 MG tablet Take 1 tablet by mouth once daily   No facility-administered encounter medications on file as of 09/22/2023.    Allergies (verified) Codeine and Lactose intolerance (gi)   History: Past Medical History:  Diagnosis Date   Anxiety    Arthritis    R knee, back, hands    Cellulitis of knee  04/2016   RT KNEE   Cellulitis of right knee 04/18/2016   Childhood asthma    Complication of anesthesia    woke up slowly - 41yrs. ago   Depression    related to husband illness and death   History of jaundice    as a child     Hx of varicella    Hyperthyroidism    during pregnancy, treated & resolved post partum    IBS (irritable bowel syndrome)    LBBB (left bundle branch block)    Scoliosis    Scoliosis 07/2015   Thyroid  disease in pregnancy    TOBACCO DEPENDENCE 07/06/2006   Qualifier: Diagnosis of  By: Winona Haw     Troponin level elevated    Past Surgical History:  Procedure Laterality Date   ATRIAL FIBRILLATION ABLATION N/A 08/11/2022   Procedure: ATRIAL FIBRILLATION ABLATION;  Surgeon: Lei Pump, MD;  Location: [facility];  Service: Cardiovascular;  Laterality: N/A;   BREAST BIOPSY Right 1979   benign    Cyst removed from ovary     KNEE SURGERY     right duda    LEFT HEART CATH AND CORONARY ANGIOGRAPHY N/A 10/31/2018   Procedure: LEFT HEART CATH AND CORONARY ANGIOGRAPHY;  Surgeon: Millicent Ally, MD;  Location: [facility];  Service: Cardiovascular;  Laterality: N/A;   TONSILLECTOMY AND ADENOIDECTOMY  1073   TOTAL KNEE ARTHROPLASTY 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   Procedure: TOTAL KNEE ARTHROPLASTY;  Surgeon: Timothy Ford, MD;  Location: [facility] OR;  Service: Orthopedics;  Laterality: Right;   TUBAL LIGATION     WISDOM TOOTH EXTRACTION     Family History  Problem Relation Age of Onset   Alcohol abuse Mother        died from stomach ulcers   Stroke Father        died  from cva and mi   Hypertension Father    Hyperlipidemia Father    Coronary artery disease Father        Age 31   Colon polyps Sister    Hepatitis C Brother    Colon cancer Neg Hx    Social History   Socioeconomic History   Marital status: Widowed    Spouse name: Not on file   Number of children: 3   Years of education: Not on file   Highest education level: Not on file   Occupational History   Occupation: self    Comment: Catering and cleaning  Tobacco Use   Smoking status: Former    Current packs/day: 0.00    Average packs/day: 0.3 packs/day for 40.0 years (10.0 ttl pk-yrs)    Types: Cigarettes    Start date: 01/06/1976    Quit date: 01/06/2016    Years since quitting: 7.7   Smokeless tobacco: Never   Tobacco comments:    Former smoker 11/16/21  Substance and Sexual Activity   Alcohol use: Yes    Alcohol/week: 7.0 standard drinks of alcohol    Types: 7 Glasses of wine per week    Comment: 1 glass of wine daily 11/16/21   Drug use: No   Sexual activity: Not on file  Other Topics Concern   Not on file  Social History Narrative   Husband passed away on 2008/02/22 hep c and cirrhosis.   She is hep c negative 2006   G3P3   HH of 2-3  son no pets    No falls .  Has smoke detector and wears seat belts. POsitive  firearms. No excess sun exposure.    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   Had time of nono insuranced and cannot affort 800 per month for catastrophic    Stopped tobacco fall 2017 . ocass etoh.  No rec drugs.   Daughter patient in our practice   Lowery Rue   Social Drivers of Health   Financial Resource Strain: Low Risk  (09/22/2023)   Overall Financial Resource Strain (CARDIA)    Difficulty of Paying Living Expenses: Not hard at all  Food Insecurity: No Food Insecurity (09/22/2023)   Hunger Vital Sign    Worried About Running Out of Food in the Last Year: Never true    Ran Out of Food in the Last Year: Never true  Transportation Needs: No Transportation Needs (09/22/2023)   PRAPARE - Administrator, Civil Service (Medical): No    Lack of Transportation (Non-Medical): No  Physical Activity: Insufficiently Active (09/22/2023)   Exercise Vital Sign    Days of Exercise per Week: 3 days    Minutes of Exercise per Session: 40 min  Stress: No Stress Concern Present (09/22/2023)   [employer] of  Occupational Health - Occupational Stress Questionnaire    Feeling of Stress : Not at all  Social Connections: Moderately Integrated (09/22/2023)   Social Connection and Isolation Panel [NHANES]    Frequency of Communication with Friends and Family: More than three times a week    Frequency of Social Gatherings with Friends and Family: More than three times a week    Attends Religious Services: More than 4 times per year    Active Member of [employer] or Organizations: Yes    Attends Banker Meetings: More than 4 times per year    Marital Status: Widowed    Tobacco Counseling Counseling given: Not Answered Tobacco comments: Former smoker 11/16/21    Clinical Intake:  Pre-visit preparation completed: Yes  Pain : No/denies pain     BMI - recorded: 26.7 Nutritional Status: BMI 25 -29 Overweight Nutritional Risks: None Diabetes: No  Lab Results  Component Value Date   HGBA1C 5.6 06/22/2021   HGBA1C 5.5 08/28/2020   HGBA1C 5.6 06/10/2019     How often do you need to have someone help you when you read instructions, pamphlets, or other written materials from your doctor or pharmacy?: 1 - Never  Interpreter Needed?: No  Information entered by :: Farris Hong LPN   Activities of Daily Living     09/22/2023   10:14 AM  In your present state of health, do you have any difficulty performing the following activities:  Hearing? 0  Vision? 0  Difficulty concentrating or making decisions? 0  Walking or climbing stairs? 0  Dressing or bathing? 0  Doing errands, shopping? 0  Preparing Food and eating ?  N  Using the Toilet? N  In the past six months, have you accidently leaked urine? N  Do you have problems with loss of bowel control? N  Managing your Medications? N  Managing your Finances? N  Housekeeping or managing your Housekeeping? N    Patient Care Team: Aida House, MD as PCP - General (Family Medicine) Avanell Leigh, MD as PCP - Cardiology  (Cardiology) Lei Pump, MD as PCP - Electrophysiology (Cardiology) Dyanna Glasgow, DO as Attending Physician (Obstetrics and Gynecology) Timothy Ford, MD as Consulting Physician (Orthopedic Surgery)  Indicate any recent Medical Services you may have received from other than [facility] providers in the past year (date may be approximate).     Assessment:    This is a routine wellness examination for Valerie Love.  Hearing/Vision screen Hearing Screening - Comments:: Denies hearing difficulties   Vision Screening - Comments:: Wears rx glasses - up to date with routine eye exams with  [facility]   Goals Addressed               This Visit's Progress     Increase physical activity (pt-stated)        Remain Active.       Depression Screen      09/22/2023   10:14 AM 07/11/2023   11:37 AM 03/28/2023    1:30 PM 08/15/2022    2:56 PM 06/13/2022    2:45 PM 11/24/2021    3:33 PM 08/31/2021    8:54 AM  PHQ 2/9 Scores  PHQ - 2 Score 0 0 2 0 0 2   PHQ- 9 Score   9  4 6    Exception Documentation       Patient refusal    Fall Risk      09/22/2023   10:15 AM 07/11/2023   11:37 AM 03/28/2023    1:29 PM 08/15/2022    2:57 PM 06/13/2022    2:45 PM  Fall Risk   Falls in the past year? 0 0 1 0 0  Number falls in past yr: 0 0 0 0 0  Injury with Fall? 0 0 0 0 0  Risk for fall due to : No Fall Risks No Fall Risks No Fall Risks No Fall Risks Other (Comment)  Follow up Falls prevention discussed;Falls evaluation completed Falls prevention discussed Falls evaluation completed Falls prevention discussed Falls evaluation completed    MEDICARE RISK AT HOME:   Medicare Risk at Home Any stairs in or around the home?: Yes If so, are there any without handrails?: No Home free of loose throw rugs in walkways, pet beds, electrical cords, etc?: Yes Adequate lighting in your home to reduce risk of falls?: Yes Life alert?: No Use of a cane, walker or w/c?: No Grab bars in the bathroom?: Yes Shower  chair or bench in shower?: No Elevated toilet seat or a handicapped toilet?: No  TIMED UP AND GO:  Was the test performed?  No  Cognitive Function: 6CIT completed        09/22/2023   10:16 AM 08/15/2022    2:59 PM 08/11/2021    3:06 PM  6CIT Screen  What Year? 0 points 0 points 0 points  What month? 0 points 0 points 0 points  What time? 0 points 0 points 0 points  Count back from 20 0 points 0 points 0 points  Months in reverse 0 points 0 points 0 points  Repeat phrase 0 points 0 points 0 points  Total Score 0 points 0 points 0 points    Immunizations Immunization History  Administered Date(s) Administered   Fluad Quad(high Dose 65+) 03/23/2021   Fluad Trivalent(High Dose 65+) 03/28/2023   Influenza, Quadrivalent, Recombinant, Inj, Pf 01/30/2018, 12/21/2018   Influenza,inj,Quad PF,6+ Mos 06/05/2016, 01/30/2017, 01/07/2019   Influenza-Unspecified 12/21/2018   PFIZER(Purple Top)SARS-COV-2 Vaccination 07/19/2019, 08/12/2019, 05/08/2020   Pneumococcal Polysaccharide-23 12/27/2018   Tdap 08/18/2010, 02/23/2019   Zoster Recombinant(Shingrix) 02/23/2019, 04/27/2019    Screening Tests Health Maintenance  Topic Date Due   Pneumonia Vaccine 23+ Years old (2 of 2 - PCV) 12/27/2019   COVID-19 Vaccine (4 - 2024-25 season) 01/11/2024 (Originally 01/08/2023)   INFLUENZA VACCINE  12/08/2023   Medicare Annual Wellness (AWV)  09/21/2024   MAMMOGRAM  10/10/2024   Colonoscopy  01/05/2025   DTaP/Tdap/Td (3 - Td or Tdap) 02/22/2029   DEXA SCAN  Completed   Hepatitis C Screening  Completed   Zoster Vaccines- Shingrix  Completed   HPV VACCINES  Aged Out   Meningococcal B Vaccine  Aged Out    Health Maintenance  Health Maintenance Due  Topic Date Due   Pneumonia Vaccine 44+ Years old (2 of 2 - PCV) 12/27/2019   Health Maintenance Items Addressed: Mammogram scheduled  Additional Screening:  Vision Screening: Recommended annual ophthalmology exams for early detection of glaucoma and  other disorders of the eye.  Dental Screening: Recommended annual dental exams for proper oral hygiene  Community Resource Referral / Chronic Care Management: CRR required this visit?  No   CCM required this visit?  No   Plan:    I have personally reviewed and noted the following in the patient's chart:   Medical and social history Use of alcohol, tobacco or illicit drugs  Current medications and supplements including opioid prescriptions. Patient is not currently taking opioid prescriptions. Functional ability and status Nutritional status Physical activity Advanced directives List of other physicians Hospitalizations, surgeries, and ER visits in previous 12 months Vitals Screenings to include cognitive, depression, and falls Referrals and appointments  In addition, I have reviewed and discussed with patient certain preventive protocols, quality metrics, and best practice recommendations. A written personalized care plan for preventive services as well as general preventive health recommendations were provided to patient.   Dewayne Ford, LPN   1/61/0960   After Visit Summary: (MyChart) Due to this being a telephonic visit, the after visit summary with patients personalized plan was offered to patient via MyChart   Notes: Nothing significant to report at this time.

## 2023-09-22 NOTE — Patient Instructions (Addendum)
 Ms. Garlow , Thank you for taking time out of your busy schedule to complete your Annual Wellness Visit with me. I enjoyed our conversation and look forward to speaking with you again next year. I, as well as your care team,  appreciate your ongoing commitment to your health goals. Please review the following plan we discussed and let me know if I can assist you in the future. Your Game plan/ To Do List    Referrals: If you haven't heard from the office you've been referred to, please reach out to them at the phone provided.   Follow up Visits: Next Medicare AWV with our clinical staff: 09/27/24 @ 10a   Have you seen your provider in the last 6 months (3 months if uncontrolled diabetes)?  Next Office Visit with your provider: 09/26/23 @ 8a  Clinician Recommendations:  Aim for 30 minutes of exercise or brisk walking, 6-8 glasses of water, and 5 servings of fruits and vegetables each day.       This is a list of the screening recommended for you and due dates:  Health Maintenance  Topic Date Due   Pneumonia Vaccine (2 of 2 - PCV) 12/27/2019   COVID-19 Vaccine (4 - 2024-25 season) 01/11/2024*   Flu Shot  12/08/2023   Medicare Annual Wellness Visit  09/21/2024   Mammogram  10/10/2024   Colon Cancer Screening  01/05/2025   DTaP/Tdap/Td vaccine (3 - Td or Tdap) 02/22/2029   DEXA scan (bone density measurement)  Completed   Hepatitis C Screening  Completed   Zoster (Shingles) Vaccine  Completed   HPV Vaccine  Aged Out   Meningitis B Vaccine  Aged Out  *Topic was postponed. The date shown is not the original due date.    Advanced directives: (Declined) Advance directive discussed with you today. Even though you declined this today, please call our office should you change your mind, and we can give you the proper paperwork for you to fill out. Advance Care Planning is important because it:  [x]  Makes sure you receive the medical care that is consistent with your values, goals, and  preferences  [x]  It provides guidance to your family and loved ones and reduces their decisional burden about whether or not they are making the right decisions based on your wishes.  Follow the link provided in your after visit summary or read over the paperwork we have mailed to you to help you started getting your Advance Directives in place. If you need assistance in completing these, please reach out to us  so that we can help you!  See attachments for Preventive Care and Fall Prevention Tips.

## 2023-09-26 ENCOUNTER — Encounter: Payer: PPO | Admitting: Family Medicine

## 2023-09-28 ENCOUNTER — Ambulatory Visit: Admitting: Orthopedic Surgery

## 2023-09-28 DIAGNOSIS — G8929 Other chronic pain: Secondary | ICD-10-CM | POA: Diagnosis not present

## 2023-09-28 DIAGNOSIS — M25511 Pain in right shoulder: Secondary | ICD-10-CM | POA: Diagnosis not present

## 2023-10-02 ENCOUNTER — Encounter: Payer: Self-pay | Admitting: Orthopedic Surgery

## 2023-10-02 DIAGNOSIS — G8929 Other chronic pain: Secondary | ICD-10-CM | POA: Diagnosis not present

## 2023-10-02 DIAGNOSIS — M25511 Pain in right shoulder: Secondary | ICD-10-CM | POA: Diagnosis not present

## 2023-10-02 MED ORDER — LIDOCAINE HCL 1 % IJ SOLN
5.0000 mL | INTRAMUSCULAR | Status: AC | PRN
  Administered 2023-10-02: 5 mL

## 2023-10-02 MED ORDER — METHYLPREDNISOLONE ACETATE 40 MG/ML IJ SUSP
40.0000 mg | INTRAMUSCULAR | Status: AC | PRN
  Administered 2023-10-02: 40 mg via INTRA_ARTICULAR

## 2023-10-02 NOTE — Progress Notes (Signed)
 Office Visit Note   Patient: Valerie Love           Date of Birth: 11/15/1955           MRN: [id number] Visit Date: 09/28/2023              Requested by: Aida House, MD [address] [place],  [place] [address] PCP: Aida House, MD  Chief Complaint  Patient presents with   Left Shoulder - Pain      HPI: Patient is a 68 year old woman with chronic right shoulder pain.  Assessment & Plan: Visit Diagnoses:  1. Chronic right shoulder pain     Plan: Right shoulder was injected in the subacromial space.  Follow-Up Instructions: Return if symptoms worsen or fail to improve.   Ortho Exam  Patient is alert, oriented, no adenopathy, well-dressed, normal affect, normal respiratory effort. Examination patient has decreased range of motion of the right shoulder.  The biceps tendon is tender to palpation.  Patient has pain with Neer and Hawkins impingement test.  Imaging: No results found. No images are attached to the encounter.  Labs: Lab Results  Component Value Date   HGBA1C 5.6 06/22/2021   HGBA1C 5.5 08/28/2020   HGBA1C 5.6 06/10/2019   ESRSEDRATE 10 06/22/2021   ESRSEDRATE 17 04/18/2016   ESRSEDRATE 9 08/18/2010   CRP <1.0 06/22/2021   CRP 0.8 04/18/2016   GRAMSTAIN No WBC Seen 04/18/2016   GRAMSTAIN No Squamous Epithelial Cells Seen 04/18/2016   GRAMSTAIN No Organisms Seen 04/18/2016   LABORGA Few GROUP B STREP (S.AGALACTIAE) ISOLATED 04/18/2016     Lab Results  Component Value Date   ALBUMIN 4.1 10/25/2022   ALBUMIN 4.2 06/22/2021   ALBUMIN 3.9 08/28/2020    Lab Results  Component Value Date   MG 2.0 03/07/2023   MG 1.9 11/09/2021   MG 2.3 11/12/2018   No results found for: "VD25OH"  No results found for: "PREALBUMIN"    Latest Ref Rng & Units 03/07/2023    5:42 PM 07/28/2022   10:40 AM 11/09/2021    7:42 PM  CBC EXTENDED  WBC 4.0 - 10.5 K/uL 8.5  4.9  6.6   RBC 3.87 - 5.11 MIL/uL 4.72  4.50  4.12   Hemoglobin 12.0 -  15.0 g/dL 19.1  47.8  29.5   HCT 36.0 - 46.0 % 43.6  43.2  38.6   Platelets 150 - 400 K/uL 274  258  225      There is no height or weight on file to calculate BMI.  Orders:  No orders of the defined types were placed in this encounter.  No orders of the defined types were placed in this encounter.    Procedures: Large Joint Inj: R subacromial bursa on 10/02/2023 11:38 AM Indications: diagnostic evaluation and pain Details: 22 G 1.5 in needle, posterior approach  Arthrogram: No  Medications: 5 mL lidocaine  1 %; 40 mg methylPREDNISolone  acetate 40 MG/ML Outcome: tolerated well, no immediate complications Procedure, treatment alternatives, risks and benefits explained, specific risks discussed. Consent was given by the patient. Immediately prior to procedure a time out was called to verify the correct patient, procedure, equipment, support staff and site/side marked as required. Patient was prepped and draped in the usual sterile fashion.      Clinical Data: No additional findings.  ROS:  All other systems negative, except as noted in the HPI. Review of Systems  Objective: Vital Signs: There were no vitals taken  for this visit.  Specialty Comments:  EXAM: MRI CERVICAL SPINE WITHOUT CONTRAST   TECHNIQUE: Multiplanar, multisequence MR imaging of the cervical spine was performed. No intravenous contrast was administered.   COMPARISON:  Radiographs of the cervical spine 03/16/2020 (images available, report unavailable).   FINDINGS: Mild intermittent motion degradation.   Alignment: Reversal of the expected cervical lordosis. 2 mm C2-C3 grade 1 anterolisthesis. 2-3 mm C3-C4 grade 1 anterolisthesis.   Vertebrae: Vertebral body height is maintained. Mild degenerative endplate edema at D6-L8, C5-C6, C6-C7 and C7-T1. Marrow edema within the bilateral posterior elements at C2-C3 and on the left at C3-C4, likely degenerative and related to facet arthrosis.   Cord: No  signal abnormality identified within the cervical spinal cord   Posterior Fossa, vertebral arteries, paraspinal tissues: No abnormality identified within included portions of the posterior fossa. Flow voids preserved within the imaged cervical vertebral arteries. Paraspinal soft tissues unremarkable.   Disc levels:   Multilevel disc space narrowing, greatest at C4-C5 (moderate), C5-C6 (moderate/advanced), C6-C7 (moderate/advanced) and C7-T1 (moderate/advanced).   C2-C3: Grade 1 anterolisthesis. Slight disc uncovering. Facet arthrosis on the right. No significant spinal canal or foraminal stenosis.   C3-C4: Grade 1 anterolisthesis. Slight disc uncovering and disc bulge. Uncovertebral hypertrophy on the left. Facet arthrosis (greater on the left and fairly advanced on the left). No significant spinal canal stenosis. Severe left neural foraminal narrowing.   C4-C5: Disc bulge. Endplate spurring with bilateral disc osteophyte ridge/uncinate hypertrophy. A disc bulge effaces the ventral thecal sac, contacting and minimally flattening the ventral spinal cord. However, the dorsal CSF space is maintained within the spinal canal. Bilateral neural foraminal narrowing (mild right, severe left).   C5-C6: Disc bilateral disc osteophyte ridge/uncinate hypertrophy. Mild-to-moderate spinal canal stenosis. The disc bulge contacts and minimally flattens the ventral aspect of the spinal cord. Bilateral neural foraminal narrowing (severe right, mild left).   C6-C7: Disc bulge. Bilateral disc osteophyte ridge/uncinate hypertrophy. Mild spinal canal stenosis (without significant spinal cord mass effect). Severe bilateral neural foraminal narrowing.   C7-T1: Disc bulge. Right-sided disc osteophyte ridge. Facet arthrosis. No significant spinal canal stenosis. Mild right neural foraminal narrowing.   IMPRESSION: Cervical spondylosis, as outlined and with findings most notably as follows.   At  C5-C6, there is moderate/advanced disc degeneration. Disc bulge. Bilateral disc osteophyte ridge/uncinate hypertrophy. Mild to moderate spinal canal narrowing. The disc bulge contacts and minimally flattens the ventral aspect of the spinal cord. Bilateral neural foraminal narrowing (severe right, mild left).   At C4-C5, there is moderate disc degeneration. Disc bulge with bilateral disc osteophyte ridge/uncinate hypertrophy. The disc bulge contacts and minimally flattens the ventral spinal cord. However, the dorsal CSF space is maintained within the spinal canal. Bilateral neural foraminal narrowing (mild right, severe left).   No more than mild spinal canal narrowing at the remaining levels. Additional sites of foraminal stenosis, as detailed and greatest on the left at C3-C4 (severe) and bilaterally at C6-C7 (severe).   Also of note, disc degeneration is moderate/advanced at C6-C7 and C7-T1.   Degenerative marrow edema as described.   Nonspecific reversal of expected cervical lordosis. Grade 1 anterolisthesis at C2-C3 and C3-C4.     Electronically Signed   By: Bascom Lily D.O.   On: 07/15/2021 07:41  PMFS History: Patient Active Problem List   Diagnosis Date Noted   Paroxysmal atrial fibrillation (HCC) 06/05/2023   Asthma in adult 10/25/2022   Hypercoagulable state due to paroxysmal atrial fibrillation (HCC) 11/16/2021   Atrial fibrillation with  RVR (HCC) 11/10/2021   Atrial fibrillation with rapid ventricular response (HCC) 11/09/2021   Acute systolic heart failure (HCC) 11/01/2018   NSTEMI (non-ST elevated myocardial infarction) (HCC) 10/30/2018   Hyperlipidemia 06/13/2018   Lactose intolerance 06/13/2018   S/P total knee arthroplasty, right 03/16/2016   Unilateral primary osteoarthritis, right knee    Special screening for malignant neoplasms, colon 12/29/2014   Diarrhea 12/29/2014   Medication management 05/13/2013   Does not have health insurance 05/13/2013    Adjustment reaction with anxiety and depression 03/12/2012   Medication monitoring encounter 03/12/2012   Hemorrhoids 08/13/2011   Perineal itching, female 08/13/2011   LBBB (left bundle branch block) 09/24/2010   Murmur 09/08/2010   EKG, abnormal 08/19/2010   Visit for preventive health examination 08/19/2010   Palpitations 08/18/2010   Dizziness 08/18/2010   Arthritis 08/18/2010   Anxiety state 06/18/2008   GRIEF REACTION 06/18/2008   DEPRESSION, MAJOR, RECURRENT 07/06/2006   ARTHRALGIA, UNSPECIFIED 07/06/2006   Past Medical History:  Diagnosis Date   Anxiety    Arthritis    R knee, back, hands    Cellulitis of knee 04/2016   RT KNEE   Cellulitis of right knee 04/18/2016   Childhood asthma    Complication of anesthesia    woke up slowly - 79yrs. ago   Depression    related to husband illness and death   History of jaundice    as a child     Hx of varicella    Hyperthyroidism    during pregnancy, treated & resolved post partum    IBS (irritable bowel syndrome)    LBBB (left bundle branch block)    Scoliosis    Scoliosis 07/2015   Thyroid  disease in pregnancy    TOBACCO DEPENDENCE 07/06/2006   Qualifier: Diagnosis of  By: Winona Haw     Troponin level elevated     Family History  Problem Relation Age of Onset   Alcohol abuse Mother        died from stomach ulcers   Stroke Father        died  from cva and mi   Hypertension Father    Hyperlipidemia Father    Coronary artery disease Father        Age 59   Colon polyps Sister    Hepatitis C Brother    Colon cancer Neg Hx     Past Surgical History:  Procedure Laterality Date   ATRIAL FIBRILLATION ABLATION N/A 08/11/2022   Procedure: ATRIAL FIBRILLATION ABLATION;  Surgeon: Lei Pump, MD;  Location: [facility];  Service: Cardiovascular;  Laterality: N/A;   BREAST BIOPSY Right 1979   benign    Cyst removed from ovary     KNEE SURGERY     right Tasfia Vasseur    LEFT HEART CATH AND CORONARY ANGIOGRAPHY  N/A 10/31/2018   Procedure: LEFT HEART CATH AND CORONARY ANGIOGRAPHY;  Surgeon: Millicent Ally, MD;  Location: [facility];  Service: Cardiovascular;  Laterality: N/A;   TONSILLECTOMY AND ADENOIDECTOMY  1073   TOTAL KNEE ARTHROPLASTY 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   Procedure: TOTAL KNEE ARTHROPLASTY;  Surgeon: Timothy Ford, MD;  Location: [facility] OR;  Service: Orthopedics;  Laterality: Right;   TUBAL LIGATION     WISDOM TOOTH EXTRACTION     Social History   Occupational History   Occupation: self    Comment: Catering and cleaning  Tobacco Use   Smoking status: Former    Current packs/day: 0.00  Average packs/day: 0.3 packs/day for 40.0 years (10.0 ttl pk-yrs)    Types: Cigarettes    Start date: 01/06/1976    Quit date: 01/06/2016    Years since quitting: 7.7   Smokeless tobacco: Never   Tobacco comments:    Former smoker 11/16/21  Substance and Sexual Activity   Alcohol use: Yes    Alcohol/week: 7.0 standard drinks of alcohol    Types: 7 Glasses of wine per week    Comment: 1 glass of wine daily 11/16/21   Drug use: No   Sexual activity: Not on file

## 2023-10-02 NOTE — Progress Notes (Unsigned)
  Electrophysiology Office Note:   Date:  10/03/2023  ID:  Valerie, Love 18-Jul-1955, MRN [id number]  Primary Cardiologist: Lauro Portal, MD Electrophysiologist: Lei Pump, MD      History of Present Illness:   Valerie Love is a 68 y.o. female with h/o PAF s/p ablation, PSVT, and HFrecEF seen today for routine electrophysiology followup.   Since last being seen in our clinic the patient reports doing very well. No breakthrough AF of which she is aware. Otherwise, she denies chest pain, palpitations, dyspnea, PND, orthopnea, nausea, vomiting, dizziness, syncope, edema, weight gain, or early satiety.   Review of systems complete and found to be negative unless listed in HPI.   EP Information / Studies Reviewed:    EKG is ordered today. Personal review as below.  EKG Interpretation Date/Time:  Tuesday Oct 03 2023 10:23:11 EDT Ventricular Rate:  59 PR Interval:  150 QRS Duration:  130 QT Interval:  476 QTC Calculation: 471 R Axis:   84  Text Interpretation: Sinus bradycardia Left bundle branch block When compared with ECG of 05-Jun-2023 10:05, No significant change was found Confirmed by Pilar Bridge [phone]) on 10/03/2023 10:27:50 AM    Arrhythmia/Device History S/p PVI 08/11/2022   Physical Exam:   VS:  BP (!) 140/82   Pulse (!) 59   Ht 5\' 2"  (1.575 m)   Wt 149 lb (67.6 kg)   SpO2 97%   BMI 27.25 kg/m    Wt Readings from Last 3 Encounters:  10/03/23 149 lb (67.6 kg)  09/22/23 146 lb (66.2 kg)  07/11/23 146 lb 11.2 oz (66.5 kg)     GEN: No acute distress NECK: No JVD; No carotid bruits CARDIAC: Regular rate and rhythm, no murmurs, rubs, gallops RESPIRATORY:  Clear to auscultation without rales, wheezing or rhonchi  ABDOMEN: Soft, non-tender, non-distended EXTREMITIES:  No edema; No deformity   ASSESSMENT AND PLAN:    Paroxysmal Atrial Fibrillation  S/p Ablation 08/2022 EKG today shows NSR Continue Eliquis  5mg  BID for CHA2DS2VASC of at  least 2 Continue Diltiazem  180 mg daily     Secondary hypercoagulable state Pt on Eliquis  as above   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

## 2023-10-03 ENCOUNTER — Encounter: Payer: Self-pay | Admitting: Student

## 2023-10-03 ENCOUNTER — Ambulatory Visit: Attending: Student | Admitting: Student

## 2023-10-03 VITALS — BP 140/82 | HR 59 | Ht 62.0 in | Wt 149.0 lb

## 2023-10-03 DIAGNOSIS — D6869 Other thrombophilia: Secondary | ICD-10-CM | POA: Diagnosis not present

## 2023-10-03 DIAGNOSIS — I447 Left bundle-branch block, unspecified: Secondary | ICD-10-CM | POA: Diagnosis not present

## 2023-10-03 DIAGNOSIS — I480 Paroxysmal atrial fibrillation: Secondary | ICD-10-CM | POA: Diagnosis not present

## 2023-10-03 NOTE — Patient Instructions (Signed)
 Medication Instructions:  No medication changes today. *If you need a refill on your cardiac medications before your next appointment, please call your pharmacy*  Lab Work: BMET and CBC today. If you have labs (blood work) drawn today and your tests are completely normal, you will receive your results only by: MyChart Message (if you have MyChart) OR A paper copy in the mail If you have any lab test that is abnormal or we need to change your treatment, we will call you to review the results.  Testing/Procedures: No testing ordered today  Follow-Up: At [facility], you and your health needs are our priority.  As part of our continuing mission to provide you with exceptional heart care, our providers are all part of one team.  This team includes your primary Cardiologist (physician) and Advanced Practice Providers or APPs (Physician Assistants and Nurse Practitioners) who all work together to provide you with the care you need, when you need it.  Your next appointment:   12 month(s)  Provider:   You may see Will Cortland Ding, MD or one of the following Advanced Practice Providers on your designated Care Team:   Mertha Abrahams, [place] [address]" [place], PA-C Suzann Riddle, NP Creighton Doffing, NP    We recommend signing up for the patient portal called "MyChart".  Sign up information is provided on this After Visit Summary.  MyChart is used to connect with patients for Virtual Visits (Telemedicine).  Patients are able to view lab/test results, encounter notes, upcoming appointments, etc.  Non-urgent messages can be sent to your provider as well.   To learn more about what you can do with MyChart, go to [email or web address].

## 2023-10-04 ENCOUNTER — Ambulatory Visit: Payer: Self-pay | Admitting: Student

## 2023-10-04 LAB — CBC
Hematocrit: 45.4 % (ref 34.0–46.6)
Hemoglobin: 14.1 g/dL (ref 11.1–15.9)
MCH: 30 pg (ref 26.6–33.0)
MCHC: 31.1 g/dL — ABNORMAL LOW (ref 31.5–35.7)
MCV: 97 fL (ref 79–97)
Platelets: 319 10*3/uL (ref 150–450)
RBC: 4.7 x10E6/uL (ref 3.77–5.28)
RDW: 12.4 % (ref 11.7–15.4)
WBC: 7 10*3/uL (ref 3.4–10.8)

## 2023-10-04 LAB — BASIC METABOLIC PANEL WITH GFR
BUN/Creatinine Ratio: 20 (ref 12–28)
BUN: 18 mg/dL (ref 8–27)
CO2: 24 mmol/L (ref 20–29)
Calcium: 9.4 mg/dL (ref 8.7–10.3)
Chloride: 100 mmol/L (ref 96–106)
Creatinine, Ser: 0.91 mg/dL (ref 0.57–1.00)
Glucose: 57 mg/dL — ABNORMAL LOW (ref 70–99)
Potassium: 4.3 mmol/L (ref 3.5–5.2)
Sodium: 140 mmol/L (ref 134–144)
eGFR: 69 mL/min/{1.73_m2} (ref >59)

## 2023-10-05 ENCOUNTER — Ambulatory Visit (INDEPENDENT_AMBULATORY_CARE_PROVIDER_SITE_OTHER): Admitting: Family Medicine

## 2023-10-05 ENCOUNTER — Encounter: Payer: Self-pay | Admitting: Family Medicine

## 2023-10-05 VITALS — BP 110/68 | HR 67 | Temp 98.2°F | Ht 61.5 in | Wt 147.3 lb

## 2023-10-05 DIAGNOSIS — Z0000 Encounter for general adult medical examination without abnormal findings: Secondary | ICD-10-CM | POA: Diagnosis not present

## 2023-10-05 DIAGNOSIS — R5383 Other fatigue: Secondary | ICD-10-CM | POA: Diagnosis not present

## 2023-10-05 DIAGNOSIS — E782 Mixed hyperlipidemia: Secondary | ICD-10-CM

## 2023-10-05 DIAGNOSIS — J0100 Acute maxillary sinusitis, unspecified: Secondary | ICD-10-CM | POA: Diagnosis not present

## 2023-10-05 DIAGNOSIS — Z131 Encounter for screening for diabetes mellitus: Secondary | ICD-10-CM

## 2023-10-05 DIAGNOSIS — Z780 Asymptomatic menopausal state: Secondary | ICD-10-CM

## 2023-10-05 DIAGNOSIS — J4520 Mild intermittent asthma, uncomplicated: Secondary | ICD-10-CM | POA: Diagnosis not present

## 2023-10-05 DIAGNOSIS — F411 Generalized anxiety disorder: Secondary | ICD-10-CM

## 2023-10-05 LAB — LIPID PANEL
Cholesterol: 186 mg/dL (ref 0–200)
HDL: 108.4 mg/dL (ref >39.00)
LDL Cholesterol: 59 mg/dL (ref 0–99)
NonHDL: 77.41
Total CHOL/HDL Ratio: 2
Triglycerides: 90 mg/dL (ref 0.0–149.0)
VLDL: 18 mg/dL (ref 0.0–40.0)

## 2023-10-05 LAB — HEMOGLOBIN A1C: Hgb A1c MFr Bld: 5.6 % (ref 4.6–6.5)

## 2023-10-05 LAB — TSH: TSH: 0.62 u[IU]/mL (ref 0.35–5.50)

## 2023-10-05 LAB — HEPATIC FUNCTION PANEL
ALT: 21 U/L (ref 0–35)
AST: 19 U/L (ref 0–37)
Albumin: 4.4 g/dL (ref 3.5–5.2)
Alkaline Phosphatase: 55 U/L (ref 39–117)
Bilirubin, Direct: 0.1 mg/dL (ref 0.0–0.3)
Total Bilirubin: 0.5 mg/dL (ref 0.2–1.2)
Total Protein: 6.8 g/dL (ref 6.0–8.3)

## 2023-10-05 MED ORDER — CITALOPRAM HYDROBROMIDE 40 MG PO TABS: 40.0000 mg | ORAL_TABLET | Freq: Every day | ORAL | 1 refills | Status: DC

## 2023-10-05 MED ORDER — AMOXICILLIN-POT CLAVULANATE 875-125 MG PO TABS: 1.0000 | ORAL_TABLET | Freq: Two times a day (BID) | ORAL | 0 refills | Status: DC

## 2023-10-05 MED ORDER — ALBUTEROL SULFATE HFA 108 (90 BASE) MCG/ACT IN AERS: INHALATION_SPRAY | RESPIRATORY_TRACT | 5 refills | Status: AC

## 2023-10-05 NOTE — Patient Instructions (Signed)

## 2023-10-05 NOTE — Progress Notes (Unsigned)
 Complete physical exam  Patient: Valerie Love   DOB: 1955/11/29   68 y.o. Female  MRN: [id number]  Subjective:     Chief Complaint  Patient presents with  . Annual Exam  . Sinus Problem    Patient complains of facial pain, headache and bilateral ear pain and pressure x1 month    Valerie Love is a 68 y.o. female who presents today for a complete physical exam. She reports consuming a general and pt is lactose intolerant diet. Home exercise routine includes walking 1-2 hrs per week. She generally feels well. She reports sleeping well. She does have additional problems to discuss today.   Pt is reporting dull earaches, has been going on for about 1 month. Sinus pain is in the face/cheek on the right side mostly, some on the left as well, no fever or chills. At first thought it was allergies, however it is had not improved. States that the sinus drainage stopped about 2-3 weeks ago but the pain and the pressure have persisted. Has had problems with her sinuses in the past when she was a smoker previously.  Pt is afraid to take any OTC medications because of her heart so she hasn't been taking  Pt is also reporting 4-5 month history of fatigue, states she is sleeping about 15 hours per day-- gets a great night of sleep but after about 4-5 hours she feels exhausted and has to take a nap.   Most recent fall risk assessment:    10/05/2023    1:22 PM  Fall Risk   Falls in the past year? 0  Number falls in past yr: 0  Injury with Fall? 0  Risk for fall due to : No Fall Risks  Follow up Falls evaluation completed     Most recent depression screenings:    09/22/2023   10:14 AM 07/11/2023   11:37 AM  PHQ 2/9 Scores  PHQ - 2 Score 0 0    Vision:Within last year and Valerie Love -- has mild cataracts but not ready for surgery and Dental: No current dental problems and Receives regular dental care -- sees Dr. Franki Isles  Patient Active Problem List   Diagnosis Date Noted  .  Paroxysmal atrial fibrillation (HCC) 06/05/2023  . Asthma in adult 10/25/2022  . Hypercoagulable state due to paroxysmal atrial fibrillation (HCC) 11/16/2021  . Atrial fibrillation with RVR (HCC) 11/10/2021  . Atrial fibrillation with rapid ventricular response (HCC) 11/09/2021  . Acute systolic heart failure (HCC) 11/01/2018  . NSTEMI (non-ST elevated myocardial infarction) (HCC) 10/30/2018  . Hyperlipidemia 06/13/2018  . Lactose intolerance 06/13/2018  . 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 Unilateral primary osteoarthritis, right knee   . Special screening for malignant neoplasms, colon 12/29/2014  . Diarrhea 12/29/2014  . Medication management 05/13/2013  . Does not have health insurance 05/13/2013  . Adjustment reaction with anxiety and depression 03/12/2012  . Medication monitoring encounter 03/12/2012  . Hemorrhoids 08/13/2011  . Perineal itching, female 08/13/2011  . LBBB (left bundle branch block) 09/24/2010  . Murmur 09/08/2010  . EKG, abnormal 08/19/2010  . Visit for preventive health examination 08/19/2010  . Palpitations 08/18/2010  . Dizziness 08/18/2010  . Arthritis 08/18/2010  . Anxiety state 06/18/2008  . GRIEF REACTION 06/18/2008  . DEPRESSION, MAJOR, RECURRENT 07/06/2006  . ARTHRALGIA, UNSPECIFIED 07/06/2006      Patient Care Team: Aida House, MD as PCP - General (Family Medicine) Avanell Leigh, MD as PCP -  Cardiology (Cardiology) Lei Pump, MD as PCP - Electrophysiology (Cardiology) Dyanna Glasgow, DO as Attending Physician (Obstetrics and Gynecology) Timothy Ford, MD as Consulting Physician (Orthopedic Surgery)   Outpatient Medications Prior to Visit  Medication Sig  . acetaminophen  (TYLENOL ) 650 MG CR tablet Take 1,000 mg by mouth 2 (two) times daily.  . ALPRAZolam  (XANAX ) 0.5 MG tablet TAKE 1 TABLET BY MOUTH THREE TIMES A DAY AS NEEDED FOR ANXIETY. MAXIMUM 3 TALBETS PER 24 HOURS  . apixaban  (ELIQUIS ) 5 MG TABS  tablet Take 1 tablet (5 mg total) by mouth 2 (two) times daily.  . Calcium  Carb-Cholecalciferol (CALCIUM +D3) 600-800 MG-UNIT TABS Take 1 tablet by mouth daily with breakfast.  . diltiazem  (CARDIZEM  CD) 180 MG 24 hr capsule Take 1 capsule (180 mg total) by mouth daily.  . diltiazem  (CARDIZEM ) 30 MG tablet Take one tablet by mouth every 4 hours as needed for HR greater than 100, top number B/P needs to be above 100  . diphenhydrAMINE  (BENADRYL ) 25 MG tablet Take 25 mg by mouth at bedtime as needed for sleep or itching.  . Multiple Vitamins-Minerals (ALIVE ONCE DAILY WOMENS 50+) TABS Take 1 tablet by mouth daily.  . Probiotic Product (TRUBIOTICS) CAPS Take 1 capsule by mouth daily.  . rosuvastatin  (CRESTOR ) 5 MG tablet Take 1 tablet by mouth once daily  . [DISCONTINUED] albuterol  (VENTOLIN  HFA) 108 (90 Base) MCG/ACT inhaler INHALE 2 PUFFS BY MOUTH EVERY 6 HOURS AS NEEDED FOR WHEEZING FOR SHORTNESS OF BREATH  . [DISCONTINUED] citalopram  (CELEXA ) 20 MG tablet Take 1.5 tablets (30 mg total) by mouth daily.   No facility-administered medications prior to visit.    Review of Systems  All other systems reviewed and are negative.      Objective:     BP 110/68   Pulse 67   Temp 98.2 F (36.8 C) (Oral)   Ht 5' 1.5" (1.562 m)   Wt 147 lb 4.8 oz (66.8 kg)   SpO2 97%   BMI 27.38 kg/m  {Vitals History (Optional):23777}  Physical Exam Vitals reviewed.  Constitutional:      Appearance: Normal appearance. She is well-groomed and normal weight.  HENT:     Right Ear: Tympanic membrane and ear canal normal.     Left Ear: Tympanic membrane and ear canal normal.     Mouth/Throat:     Mouth: Mucous membranes are moist.     Pharynx: No posterior oropharyngeal erythema.  Eyes:     Conjunctiva/sclera: Conjunctivae normal.  Neck:     Thyroid : No thyromegaly.  Cardiovascular:     Rate and Rhythm: Normal rate and regular rhythm.     Pulses: Normal pulses.     Heart sounds: S1 normal and S2 normal.   Pulmonary:     Effort: Pulmonary effort is normal.     Breath sounds: Normal breath sounds and air entry.  Abdominal:     General: Abdomen is flat. Bowel sounds are normal.     Palpations: Abdomen is soft.  Musculoskeletal:     Right lower leg: No edema.     Left lower leg: No edema.  Lymphadenopathy:     Cervical: No cervical adenopathy.  Neurological:     Mental Status: She is alert and oriented to person, place, and time. Mental status is at baseline.     Gait: Gait is intact.  Psychiatric:        Mood and Affect: Mood and affect normal.        Speech:  Speech normal.        Behavior: Behavior normal.        Judgment: Judgment normal.     No results found for any visits on 10/05/23. {Show previous labs (optional):23779}    Assessment & Plan:    Routine Health Maintenance and Physical Exam  Immunization History  Administered Date(s) Administered  . Fluad Quad(high Dose 65+) 03/23/2021  . Fluad Trivalent(High Dose 65+) 03/28/2023  . Influenza, Quadrivalent, Recombinant, Inj, Pf 01/30/2018, 12/21/2018  . Influenza,inj,Quad PF,6+ Mos 06/05/2016, 01/30/2017, 01/07/2019  . Influenza-Unspecified 12/21/2018  . PFIZER(Purple Top)SARS-COV-2 Vaccination 07/19/2019, 08/12/2019, 05/08/2020  . Pneumococcal Polysaccharide-23 12/27/2018  . Tdap 08/18/2010, 02/23/2019  . Zoster Recombinant(Shingrix) 02/23/2019, 04/27/2019    Health Maintenance  Topic Date Due  . Pneumonia Vaccine 82+ Years old (2 of 2 - PCV) 12/27/2019  . COVID-19 Vaccine (4 - 2024-25 season) 01/11/2024 (Originally 01/08/2023)  . INFLUENZA VACCINE  12/08/2023  . Medicare Annual Wellness (AWV)  09/21/2024  . MAMMOGRAM  10/10/2024  . Colonoscopy  01/05/2025  . DTaP/Tdap/Td (3 - Td or Tdap) 02/22/2029  . DEXA SCAN  Completed  . Hepatitis C Screening  Completed  . Zoster Vaccines- Shingrix  Completed  . HPV VACCINES  Aged Out  . Meningococcal B Vaccine  Aged Out    Discussed health benefits of physical activity,  and encouraged her to engage in regular exercise appropriate for her age and condition.  Mixed hyperlipidemia -     Lipid panel; Future -     Hepatic function panel; Future  Mild intermittent asthma in adult without complication -     Albuterol  Sulfate HFA; INHALE 2 PUFFS BY MOUTH EVERY 6 HOURS AS NEEDED FOR WHEEZING FOR SHORTNESS OF BREATH  Dispense: 18 g; Refill: 5  Diabetes mellitus screening -     Hemoglobin A1c; Future  Other fatigue -     TSH; Future  Anxiety state -     Citalopram  Hydrobromide; Take 1 tablet (40 mg total) by mouth daily.  Dispense: 90 tablet; Refill: 1  Postmenopausal state -     DG Bone Density; Future  Routine general medical examination at a health care facility    Return in 6 months (on 04/06/2024).     Aida House, MD

## 2023-10-06 ENCOUNTER — Ambulatory Visit: Payer: Self-pay | Admitting: Family Medicine

## 2023-10-12 ENCOUNTER — Ambulatory Visit
Admission: RE | Admit: 2023-10-12 | Discharge: 2023-10-12 | Disposition: A | Discharge status: Home / Self Care | Source: Ambulatory Visit | Attending: Family Medicine | Admitting: Family Medicine

## 2023-10-12 DIAGNOSIS — Z1231 Encounter for screening mammogram for malignant neoplasm of breast: Secondary | ICD-10-CM | POA: Diagnosis not present

## 2023-10-18 ENCOUNTER — Ambulatory Visit: Payer: Self-pay | Admitting: Family Medicine

## 2023-11-01 ENCOUNTER — Encounter: Payer: Self-pay | Admitting: Family Medicine

## 2023-11-01 DIAGNOSIS — Z1211 Encounter for screening for malignant neoplasm of colon: Secondary | ICD-10-CM

## 2023-11-07 DIAGNOSIS — Z4689 Encounter for fitting and adjustment of other specified devices: Secondary | ICD-10-CM | POA: Diagnosis not present

## 2023-11-16 ENCOUNTER — Other Ambulatory Visit: Payer: Self-pay | Admitting: Student

## 2023-11-16 DIAGNOSIS — E7800 Pure hypercholesterolemia, unspecified: Secondary | ICD-10-CM

## 2023-12-12 ENCOUNTER — Other Ambulatory Visit (INDEPENDENT_AMBULATORY_CARE_PROVIDER_SITE_OTHER): Payer: Self-pay

## 2023-12-12 ENCOUNTER — Encounter: Payer: Self-pay | Admitting: Orthopedic Surgery

## 2023-12-12 ENCOUNTER — Ambulatory Visit: Admitting: Orthopedic Surgery

## 2023-12-12 DIAGNOSIS — M51362 Other intervertebral disc degeneration, lumbar region with discogenic back pain and lower extremity pain: Secondary | ICD-10-CM

## 2023-12-12 DIAGNOSIS — M5442 Lumbago with sciatica, left side: Secondary | ICD-10-CM

## 2023-12-12 MED ORDER — PREDNISONE 10 MG PO TABS: 10.0000 mg | ORAL_TABLET | Freq: Every day | ORAL | 0 refills | Status: DC

## 2023-12-12 MED ORDER — METHOCARBAMOL 500 MG PO TABS: 500.0000 mg | ORAL_TABLET | Freq: Three times a day (TID) | ORAL | 0 refills | Status: DC | PRN

## 2023-12-12 NOTE — Progress Notes (Signed)
 Office Visit Note   Patient: Valerie Love           Date of Birth: 06/01/1955           MRN: [id number] Visit Date: 12/12/2023              Requested by: Ozell Heron HERO, MD [address] [place],  [place] [address] PCP: Ozell Heron HERO, MD  Chief Complaint  Patient presents with   Left Hip - Pain      HPI: Patient is a 68 year old woman who presents with lower back pain as well as left lower extremity sciatic symptoms that radiate to her toes.  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  Assessment & Plan: Visit Diagnoses:  1. Acute left-sided low back pain with left-sided sciatica   2. Degeneration of intervertebral disc of lumbar region with discogenic back pain and lower extremity pain     Plan: Will prescribe prednisone  10 mg with breakfast and Robaxin  to take in the evening.  Discussed that she may benefit from a epidural steroid injection.  Follow-Up Instructions: Return in about 4 weeks (around 01/09/2024).   Ortho Exam  Patient is alert, oriented, no adenopathy, well-dressed, normal affect, normal respiratory effort. Examination patient's radiograph shows advanced degenerative disc disease of the lumbar spine.  Patient has left-sided radicular symptoms with no focal motor weakness.  There is some calcification of the aorta radiographically.  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    Imaging: XR Lumbar Spine 2-3 Views Result Date: 12/12/2023 2 view radiographs of the lumbar spine shows a degenerative scoliosis with complete collapse of the disc space of the lumbar spine with periarticular bony spurs.  No images are attached to the encounter.  Labs: Lab Results  Component Value Date   HGBA1C 5.6 10/05/2023   HGBA1C 5.6 06/22/2021   HGBA1C 5.5 08/28/2020   ESRSEDRATE 10 06/22/2021    ESRSEDRATE 17 04/18/2016   ESRSEDRATE 9 08/18/2010   CRP <1.0 06/22/2021   CRP 0.8 04/18/2016   GRAMSTAIN No WBC Seen 04/18/2016   GRAMSTAIN No Squamous Epithelial Cells Seen 04/18/2016   GRAMSTAIN No Organisms Seen 04/18/2016   LABORGA Few GROUP B STREP (S.AGALACTIAE) ISOLATED 04/18/2016     Lab Results  Component Value Date   ALBUMIN 4.4 10/05/2023   ALBUMIN 4.1 10/25/2022   ALBUMIN 4.2 06/22/2021    Lab Results  Component Value Date   MG 2.0 03/07/2023   MG 1.9 11/09/2021   MG 2.3 11/12/2018   No results found for: VD25OH  No results found for: PREALBUMIN    Latest Ref Rng & Units 10/03/2023   10:59 AM 03/07/2023    5:42 PM 07/28/2022   10:40 AM  CBC EXTENDED  WBC 3.4 - 10.8 x10E3/uL 7.0  8.5  4.9   RBC 3.77 - 5.28 x10E6/uL 4.70  4.72  4.50   Hemoglobin 11.1 - 15.9 g/dL 85.8  85.2  86.0   HCT 34.0 - 46.6 % 45.4  43.6  43.2   Platelets 150 - 450 x10E3/uL 319  274  258      There is no height or weight on file to calculate BMI.  Orders:  Orders Placed This Encounter  Procedures   XR Lumbar Spine 2-3 Views   Meds ordered this encounter  Medications   methocarbamol  (ROBAXIN ) 500 MG tablet  Sig: Take 1 tablet (500 mg total) by mouth every 8 (eight) hours as needed for muscle spasms.    Dispense:  30 tablet    Refill:  0   predniSONE  (DELTASONE ) 10 MG tablet    Sig: Take 1 tablet (10 mg total) by mouth daily with breakfast.    Dispense:  30 tablet    Refill:  0     Procedures: No procedures performed  Clinical Data: No additional findings.  ROS:  All other systems negative, except as noted in the HPI. Review of Systems  Objective: Vital Signs: There were no vitals taken for this visit.  Specialty Comments:  EXAM: MRI CERVICAL SPINE WITHOUT CONTRAST   TECHNIQUE: Multiplanar, multisequence MR imaging of the cervical spine was performed. No intravenous contrast was administered.   COMPARISON:  Radiographs of the cervical spine 03/16/2020  (images available, report unavailable).   FINDINGS: Mild intermittent motion degradation.   Alignment: Reversal of the expected cervical lordosis. 2 mm C2-C3 grade 1 anterolisthesis. 2-3 mm C3-C4 grade 1 anterolisthesis.   Vertebrae: Vertebral body height is maintained. Mild degenerative endplate edema at R5-R4, C5-C6, C6-C7 and C7-T1. Marrow edema within the bilateral posterior elements at C2-C3 and on the left at C3-C4, likely degenerative and related to facet arthrosis.   Cord: No signal abnormality identified within the cervical spinal cord   Posterior Fossa, vertebral arteries, paraspinal tissues: No abnormality identified within included portions of the posterior fossa. Flow voids preserved within the imaged cervical vertebral arteries. Paraspinal soft tissues unremarkable.   Disc levels:   Multilevel disc space narrowing, greatest at C4-C5 (moderate), C5-C6 (moderate/advanced), C6-C7 (moderate/advanced) and C7-T1 (moderate/advanced).   C2-C3: Grade 1 anterolisthesis. Slight disc uncovering. Facet arthrosis on the right. No significant spinal canal or foraminal stenosis.   C3-C4: Grade 1 anterolisthesis. Slight disc uncovering and disc bulge. Uncovertebral hypertrophy on the left. Facet arthrosis (greater on the left and fairly advanced on the left). No significant spinal canal stenosis. Severe left neural foraminal narrowing.   C4-C5: Disc bulge. Endplate spurring with bilateral disc osteophyte ridge/uncinate hypertrophy. A disc bulge effaces the ventral thecal sac, contacting and minimally flattening the ventral spinal cord. However, the dorsal CSF space is maintained within the spinal canal. Bilateral neural foraminal narrowing (mild right, severe left).   C5-C6: Disc bilateral disc osteophyte ridge/uncinate hypertrophy. Mild-to-moderate spinal canal stenosis. The disc bulge contacts and minimally flattens the ventral aspect of the spinal cord. Bilateral neural  foraminal narrowing (severe right, mild left).   C6-C7: Disc bulge. Bilateral disc osteophyte ridge/uncinate hypertrophy. Mild spinal canal stenosis (without significant spinal cord mass effect). Severe bilateral neural foraminal narrowing.   C7-T1: Disc bulge. Right-sided disc osteophyte ridge. Facet arthrosis. No significant spinal canal stenosis. Mild right neural foraminal narrowing.   IMPRESSION: Cervical spondylosis, as outlined and with findings most notably as follows.   At C5-C6, there is moderate/advanced disc degeneration. Disc bulge. Bilateral disc osteophyte ridge/uncinate hypertrophy. Mild to moderate spinal canal narrowing. The disc bulge contacts and minimally flattens the ventral aspect of the spinal cord. Bilateral neural foraminal narrowing (severe right, mild left).   At C4-C5, there is moderate disc degeneration. Disc bulge with bilateral disc osteophyte ridge/uncinate hypertrophy. The disc bulge contacts and minimally flattens the ventral spinal cord. However, the dorsal CSF space is maintained within the spinal canal. Bilateral neural foraminal narrowing (mild right, severe left).   No more than mild spinal canal narrowing at the remaining levels. Additional sites of foraminal stenosis, as detailed and greatest  on the left at C3-C4 (severe) and bilaterally at C6-C7 (severe).   Also of note, disc degeneration is moderate/advanced at C6-C7 and C7-T1.   Degenerative marrow edema as described.   Nonspecific reversal of expected cervical lordosis. Grade 1 anterolisthesis at C2-C3 and C3-C4.     Electronically Signed   By: Rockey Childs D.O.   On: 07/15/2021 07:41  PMFS History: Patient Active Problem List   Diagnosis Date Noted   Paroxysmal atrial fibrillation (HCC) 06/05/2023   Asthma in adult 10/25/2022   Hypercoagulable state due to paroxysmal atrial fibrillation (HCC) 11/16/2021   Atrial fibrillation with RVR (HCC) 11/10/2021   Atrial  fibrillation with rapid ventricular response (HCC) 11/09/2021   Acute systolic heart failure (HCC) 11/01/2018   NSTEMI (non-ST elevated myocardial infarction) (HCC) 10/30/2018   Hyperlipidemia 06/13/2018   Lactose intolerance 06/13/2018   S/P total knee arthroplasty, right 03/16/2016   Unilateral primary osteoarthritis, right knee    Special screening for malignant neoplasms, colon 12/29/2014   Diarrhea 12/29/2014   Medication management 05/13/2013   Does not have health insurance 05/13/2013   Adjustment reaction with anxiety and depression 03/12/2012   Medication monitoring encounter 03/12/2012   Hemorrhoids 08/13/2011   Perineal itching, female 08/13/2011   LBBB (left bundle branch block) 09/24/2010   Murmur 09/08/2010   EKG, abnormal 08/19/2010   Visit for preventive health examination 08/19/2010   Palpitations 08/18/2010   Dizziness 08/18/2010   Arthritis 08/18/2010   Anxiety state 06/18/2008   GRIEF REACTION 06/18/2008   DEPRESSION, MAJOR, RECURRENT 07/06/2006   ARTHRALGIA, UNSPECIFIED 07/06/2006   Past Medical History:  Diagnosis Date   Anxiety    Arthritis    R knee, back, hands    Cellulitis of knee 04/2016   RT KNEE   Cellulitis of right knee 04/18/2016   Childhood asthma    Complication of anesthesia    woke up slowly - 32yrs. ago   Depression    related to husband illness and death   History of jaundice    as a child     Hx of varicella    Hyperthyroidism    during pregnancy, treated & resolved post partum    IBS (irritable bowel syndrome)    LBBB (left bundle branch block)    Scoliosis    Scoliosis 07/2015   Thyroid  disease in pregnancy    TOBACCO DEPENDENCE 07/06/2006   Qualifier: Diagnosis of  By: Manford Longs     Troponin level elevated     Family History  Problem Relation Age of Onset   Alcohol abuse Mother        died from stomach ulcers   Stroke Father        died  from cva and mi   Hypertension Father    Hyperlipidemia Father    Coronary  artery disease Father        Age 51   Colon polyps Sister    Hepatitis C Brother    Colon cancer Neg Hx     Past Surgical History:  Procedure Laterality Date   ATRIAL FIBRILLATION ABLATION N/A 08/11/2022   Procedure: ATRIAL FIBRILLATION ABLATION;  Surgeon: Inocencio Soyla Lunger, MD;  Location: [facility];  Service: Cardiovascular;  Laterality: N/A;   BREAST BIOPSY Right 1979   benign    Cyst removed from ovary     KNEE SURGERY     right Joslynne Klatt    LEFT HEART CATH AND CORONARY ANGIOGRAPHY N/A 10/31/2018   Procedure: LEFT HEART  CATH AND CORONARY ANGIOGRAPHY;  Surgeon: Burnard Debby LABOR, MD;  Location: [facility] INVASIVE CV LAB;  Service: Cardiovascular;  Laterality: N/A;   TONSILLECTOMY AND ADENOIDECTOMY  1073   TOTAL KNEE ARTHROPLASTY 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   Procedure: TOTAL KNEE ARTHROPLASTY;  Surgeon: Jerona LULLA Sage, MD;  Location: [facility] OR;  Service: Orthopedics;  Laterality: Right;   TUBAL LIGATION     WISDOM TOOTH EXTRACTION     Social History   Occupational History   Occupation: self    Comment: Geneticist, molecular  Tobacco Use   Smoking status: Former    Current packs/day: 0.00    Average packs/day: 0.3 packs/day for 40.0 years (10.0 ttl pk-yrs)    Types: Cigarettes    Start date: 01/06/1976    Quit date: 01/06/2016    Years since quitting: 7.9   Smokeless tobacco: Never   Tobacco comments:    Former smoker 11/16/21  Substance and Sexual Activity   Alcohol use: Yes    Alcohol/week: 7.0 standard drinks of alcohol    Types: 7 Glasses of wine per week    Comment: 1 glass of wine daily 11/16/21   Drug use: No   Sexual activity: Not on file

## 2024-01-03 ENCOUNTER — Ambulatory Visit

## 2024-01-03 DIAGNOSIS — M8589 Other specified disorders of bone density and structure, multiple sites: Secondary | ICD-10-CM | POA: Diagnosis not present

## 2024-01-03 DIAGNOSIS — Z780 Asymptomatic menopausal state: Secondary | ICD-10-CM

## 2024-01-05 NOTE — Progress Notes (Signed)
 Bone density shows only very mild bone loss, please have her continue calcium  supplements ([id number] mg daily) and vitamin D (1000 units at least daily). We will repeat the study in 2-4 years.

## 2024-01-11 ENCOUNTER — Encounter: Payer: Self-pay | Admitting: Orthopedic Surgery

## 2024-01-11 ENCOUNTER — Ambulatory Visit: Admitting: Orthopedic Surgery

## 2024-01-11 DIAGNOSIS — M51362 Other intervertebral disc degeneration, lumbar region with discogenic back pain and lower extremity pain: Secondary | ICD-10-CM | POA: Diagnosis not present

## 2024-01-11 MED ORDER — PREDNISONE 10 MG PO TABS: 10.0000 mg | ORAL_TABLET | Freq: Every day | ORAL | 0 refills | Status: AC

## 2024-01-11 MED ORDER — METHOCARBAMOL 500 MG PO TABS: 500.0000 mg | ORAL_TABLET | Freq: Three times a day (TID) | ORAL | 0 refills | Status: DC | PRN

## 2024-01-11 NOTE — Progress Notes (Signed)
 Office Visit Note   Patient: Valerie Love           Date of Birth: June 10, 1955           MRN: [id number] Visit Date: 01/11/2024              Requested by: Ozell Heron HERO, MD [address] [place],  [place] [address] PCP: Ozell Heron HERO, MD  Chief Complaint  Patient presents with   Left Leg - Pain   Lower Back - Pain      HPI: Discussed the use of AI scribe software for clinical note transcription with the patient, who gave verbal consent to proceed.  History of Present Illness Valerie Love is a 68 year old female with scoliosis, bone spurs, and spinal collapse who presents with radicular pain in the right lower extremity.  She experiences radicular pain in the back of her right leg that radiates down to her toe. The pain has decreased in severity over the past four weeks. She also has back pain and has a history of scoliosis, bone spurs, and spinal collapse. She is concerned about the possibility of surgery due to these conditions. She has had x-rays but not an MRI scan of her back.  She experiences tingling in her right leg, particularly in the morning, and muscle cramps while sleeping. She attributes the cramps to age but has been informed they may be due to low electrolytes. She drinks water regularly and inquires about natural sources of electrolytes.  She has been taking prednisone  in the morning, which has been effective in managing her pain. She also takes muscle relaxers at bedtime, which she finds helpful. Pain is primarily in the morning, about 24 hours after taking prednisone . She is interested in exploring other pain management options.     Assessment & Plan: Visit Diagnoses:  1. Degeneration of intervertebral disc of lumbar region with discogenic back pain and lower extremity pain     Plan: Assessment and Plan Assessment & Plan Chronic low back pain with lumbar scoliosis, degenerative changes, spinal stenosis, collapsed vertebra, and  bone spurs Pain persists but is less severe. Surgical intervention is complex and preferred to be avoided. Prednisone  effective for pain management. - Continue prednisone  10 mg daily with breakfast. - Initiate physical therapy for six weeks. - Advise use of heating pad or Thermacare heating belt for symptomatic relief. - Discuss potential for MRI and steroid injection if symptoms persist after therapy. - Avoid back brace as it provides minimal structural support.  Right lumbar radiculopathy Symptoms improved but persist, especially in the morning. Tramadol  and gabapentin  have limited efficacy and potential side effects. Prednisone  preferred for pain management. - Continue current management with prednisone . - Avoid tramadol  and gabapentin  due to limited benefit and potential side effects.  Muscle cramping likely due to low electrolytes Muscle cramping likely due to low electrolytes. Discussed electrolyte supplementation benefits. - Advise adding electrolyte packets to water. - Consider coconut water as a natural source of electrolytes.      Follow-Up Instructions: No follow-ups on file.   Ortho Exam  Patient is alert, oriented, no adenopathy, well-dressed, normal affect, normal respiratory effort. Physical Exam        Imaging: No results found. No images are attached to the encounter.  Labs: Lab Results  Component Value Date   HGBA1C 5.6 10/05/2023   HGBA1C 5.6 06/22/2021   HGBA1C 5.5 08/28/2020   ESRSEDRATE 10 06/22/2021   ESRSEDRATE 17 04/18/2016  ESRSEDRATE 9 08/18/2010   CRP <1.0 06/22/2021   CRP 0.8 04/18/2016   GRAMSTAIN No WBC Seen 04/18/2016   GRAMSTAIN No Squamous Epithelial Cells Seen 04/18/2016   GRAMSTAIN No Organisms Seen 04/18/2016   LABORGA Few GROUP B STREP (S.AGALACTIAE) ISOLATED 04/18/2016     Lab Results  Component Value Date   ALBUMIN 4.4 10/05/2023   ALBUMIN 4.1 10/25/2022   ALBUMIN 4.2 06/22/2021    Lab Results  Component Value  Date   MG 2.0 03/07/2023   MG 1.9 11/09/2021   MG 2.3 11/12/2018   No results found for: VD25OH  No results found for: PREALBUMIN    Latest Ref Rng & Units 10/03/2023   10:59 AM 03/07/2023    5:42 PM 07/28/2022   10:40 AM  CBC EXTENDED  WBC 3.4 - 10.8 x10E3/uL 7.0  8.5  4.9   RBC 3.77 - 5.28 x10E6/uL 4.70  4.72  4.50   Hemoglobin 11.1 - 15.9 g/dL 85.8  85.2  86.0   HCT 34.0 - 46.6 % 45.4  43.6  43.2   Platelets 150 - 450 x10E3/uL 319  274  258      There is no height or weight on file to calculate BMI.  Orders:  Orders Placed This Encounter  Procedures   Ambulatory referral to Physical Therapy   Meds ordered this encounter  Medications   methocarbamol  (ROBAXIN ) 500 MG tablet    Sig: Take 1 tablet (500 mg total) by mouth every 8 (eight) hours as needed for muscle spasms.    Dispense:  30 tablet    Refill:  0   predniSONE  (DELTASONE ) 10 MG tablet    Sig: Take 1 tablet (10 mg total) by mouth daily with breakfast.    Dispense:  30 tablet    Refill:  0     Procedures: No procedures performed  Clinical Data: No additional findings.  ROS:  All other systems negative, except as noted in the HPI. Review of Systems  Objective: Vital Signs: There were no vitals taken for this visit.  Specialty Comments:  EXAM: MRI CERVICAL SPINE WITHOUT CONTRAST   TECHNIQUE: Multiplanar, multisequence MR imaging of the cervical spine was performed. No intravenous contrast was administered.   COMPARISON:  Radiographs of the cervical spine 03/16/2020 (images available, report unavailable).   FINDINGS: Mild intermittent motion degradation.   Alignment: Reversal of the expected cervical lordosis. 2 mm C2-C3 grade 1 anterolisthesis. 2-3 mm C3-C4 grade 1 anterolisthesis.   Vertebrae: Vertebral body height is maintained. Mild degenerative endplate edema at R5-R4, C5-C6, C6-C7 and C7-T1. Marrow edema within the bilateral posterior elements at C2-C3 and on the left at  C3-C4, likely degenerative and related to facet arthrosis.   Cord: No signal abnormality identified within the cervical spinal cord   Posterior Fossa, vertebral arteries, paraspinal tissues: No abnormality identified within included portions of the posterior fossa. Flow voids preserved within the imaged cervical vertebral arteries. Paraspinal soft tissues unremarkable.   Disc levels:   Multilevel disc space narrowing, greatest at C4-C5 (moderate), C5-C6 (moderate/advanced), C6-C7 (moderate/advanced) and C7-T1 (moderate/advanced).   C2-C3: Grade 1 anterolisthesis. Slight disc uncovering. Facet arthrosis on the right. No significant spinal canal or foraminal stenosis.   C3-C4: Grade 1 anterolisthesis. Slight disc uncovering and disc bulge. Uncovertebral hypertrophy on the left. Facet arthrosis (greater on the left and fairly advanced on the left). No significant spinal canal stenosis. Severe left neural foraminal narrowing.   C4-C5: Disc bulge. Endplate spurring with bilateral disc osteophyte ridge/uncinate  hypertrophy. A disc bulge effaces the ventral thecal sac, contacting and minimally flattening the ventral spinal cord. However, the dorsal CSF space is maintained within the spinal canal. Bilateral neural foraminal narrowing (mild right, severe left).   C5-C6: Disc bilateral disc osteophyte ridge/uncinate hypertrophy. Mild-to-moderate spinal canal stenosis. The disc bulge contacts and minimally flattens the ventral aspect of the spinal cord. Bilateral neural foraminal narrowing (severe right, mild left).   C6-C7: Disc bulge. Bilateral disc osteophyte ridge/uncinate hypertrophy. Mild spinal canal stenosis (without significant spinal cord mass effect). Severe bilateral neural foraminal narrowing.   C7-T1: Disc bulge. Right-sided disc osteophyte ridge. Facet arthrosis. No significant spinal canal stenosis. Mild right neural foraminal narrowing.   IMPRESSION: Cervical  spondylosis, as outlined and with findings most notably as follows.   At C5-C6, there is moderate/advanced disc degeneration. Disc bulge. Bilateral disc osteophyte ridge/uncinate hypertrophy. Mild to moderate spinal canal narrowing. The disc bulge contacts and minimally flattens the ventral aspect of the spinal cord. Bilateral neural foraminal narrowing (severe right, mild left).   At C4-C5, there is moderate disc degeneration. Disc bulge with bilateral disc osteophyte ridge/uncinate hypertrophy. The disc bulge contacts and minimally flattens the ventral spinal cord. However, the dorsal CSF space is maintained within the spinal canal. Bilateral neural foraminal narrowing (mild right, severe left).   No more than mild spinal canal narrowing at the remaining levels. Additional sites of foraminal stenosis, as detailed and greatest on the left at C3-C4 (severe) and bilaterally at C6-C7 (severe).   Also of note, disc degeneration is moderate/advanced at C6-C7 and C7-T1.   Degenerative marrow edema as described.   Nonspecific reversal of expected cervical lordosis. Grade 1 anterolisthesis at C2-C3 and C3-C4.     Electronically Signed   By: Rockey Childs D.O.   On: 07/15/2021 07:41  PMFS History: Patient Active Problem List   Diagnosis Date Noted   Paroxysmal atrial fibrillation (HCC) 06/05/2023   Asthma in adult 10/25/2022   Hypercoagulable state due to paroxysmal atrial fibrillation (HCC) 11/16/2021   Atrial fibrillation with RVR (HCC) 11/10/2021   Atrial fibrillation with rapid ventricular response (HCC) 11/09/2021   Acute systolic heart failure (HCC) 11/01/2018   NSTEMI (non-ST elevated myocardial infarction) (HCC) 10/30/2018   Hyperlipidemia 06/13/2018   Lactose intolerance 06/13/2018   S/P total knee arthroplasty, right 03/16/2016   Unilateral primary osteoarthritis, right knee    Special screening for malignant neoplasms, colon 12/29/2014   Diarrhea 12/29/2014    Medication management 05/13/2013   Does not have health insurance 05/13/2013   Adjustment reaction with anxiety and depression 03/12/2012   Medication monitoring encounter 03/12/2012   Hemorrhoids 08/13/2011   Perineal itching, female 08/13/2011   LBBB (left bundle branch block) 09/24/2010   Murmur 09/08/2010   EKG, abnormal 08/19/2010   Visit for preventive health examination 08/19/2010   Palpitations 08/18/2010   Dizziness 08/18/2010   Arthritis 08/18/2010   Anxiety state 06/18/2008   GRIEF REACTION 06/18/2008   DEPRESSION, MAJOR, RECURRENT 07/06/2006   ARTHRALGIA, UNSPECIFIED 07/06/2006   Past Medical History:  Diagnosis Date   Anxiety    Arthritis    R knee, back, hands    Cellulitis of knee 04/2016   RT KNEE   Cellulitis of right knee 04/18/2016   Childhood asthma    Complication of anesthesia    woke up slowly - 52yrs. ago   Depression    related to husband illness and death   History of jaundice    as a child  Hx of varicella    Hyperthyroidism    during pregnancy, treated & resolved post partum    IBS (irritable bowel syndrome)    LBBB (left bundle branch block)    Scoliosis    Scoliosis 07/2015   Thyroid  disease in pregnancy    TOBACCO DEPENDENCE 07/06/2006   Qualifier: Diagnosis of  By: Manford Longs     Troponin level elevated     Family History  Problem Relation Age of Onset   Alcohol abuse Mother        died from stomach ulcers   Stroke Father        died  from cva and mi   Hypertension Father    Hyperlipidemia Father    Coronary artery disease Father        Age 44   Colon polyps Sister    Hepatitis C Brother    Colon cancer Neg Hx     Past Surgical History:  Procedure Laterality Date   ATRIAL FIBRILLATION ABLATION N/A 08/11/2022   Procedure: ATRIAL FIBRILLATION ABLATION;  Surgeon: Inocencio Soyla Lunger, MD;  Location: [facility];  Service: Cardiovascular;  Laterality: N/A;   BREAST BIOPSY Right 1979   benign    Cyst removed from  ovary     KNEE SURGERY     right Kaislee Chao    LEFT HEART CATH AND CORONARY ANGIOGRAPHY N/A 10/31/2018   Procedure: LEFT HEART CATH AND CORONARY ANGIOGRAPHY;  Surgeon: Burnard Debby LABOR, MD;  Location: [facility];  Service: Cardiovascular;  Laterality: N/A;   TONSILLECTOMY AND ADENOIDECTOMY  1073   TOTAL KNEE ARTHROPLASTY 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   Procedure: TOTAL KNEE ARTHROPLASTY;  Surgeon: Jerona LULLA Sage, MD;  Location: [facility] OR;  Service: Orthopedics;  Laterality: Right;   TUBAL LIGATION     WISDOM TOOTH EXTRACTION     Social History   Occupational History   Occupation: self    Comment: Geneticist, molecular  Tobacco Use   Smoking status: Former    Current packs/day: 0.00    Average packs/day: 0.3 packs/day for 40.0 years (10.0 ttl pk-yrs)    Types: Cigarettes    Start date: 01/06/1976    Quit date: 01/06/2016    Years since quitting: 8.0   Smokeless tobacco: Never   Tobacco comments:    Former smoker 11/16/21  Substance and Sexual Activity   Alcohol use: Yes    Alcohol/week: 7.0 standard drinks of alcohol    Types: 7 Glasses of wine per week    Comment: 1 glass of wine daily 11/16/21   Drug use: No   Sexual activity: Not on file

## 2024-01-25 NOTE — Therapy (Signed)
 OUTPATIENT PHYSICAL THERAPY THORACOLUMBAR EVALUATION   Patient Name: Valerie Love MRN: [id number] DOB:[birth year], 68 y.o., female Today's Date: 01/26/2024  END OF SESSION:  PT End of Session - 01/26/24 1221     Visit Number 1    Number of Visits 17    Date for Recertification  03/22/24    PT Start Time 1100    PT Stop Time 1140    PT Time Calculation (min) 40 min    Activity Tolerance Patient tolerated treatment well    Behavior During Therapy [facility] for tasks assessed/performed          Past Medical History:  Diagnosis Date   Anxiety    Arthritis    R knee, back, hands    Cellulitis of knee 04/2016   RT KNEE   Cellulitis of right knee 04/18/2016   Childhood asthma    Complication of anesthesia    woke up slowly - 41yrs. ago   Depression    related to husband illness and death   History of jaundice    as a child     Hx of varicella    Hyperthyroidism    during pregnancy, treated & resolved post partum    IBS (irritable bowel syndrome)    LBBB (left bundle branch block)    Scoliosis    Scoliosis 07/2015   Thyroid  disease in pregnancy    TOBACCO DEPENDENCE 07/06/2006   Qualifier: Diagnosis of  By: Valerie Love     Troponin level elevated    Past Surgical History:  Procedure Laterality Date   ATRIAL FIBRILLATION ABLATION N/A 08/11/2022   Procedure: ATRIAL FIBRILLATION ABLATION;  Surgeon: Valerie Soyla Lunger, MD;  Location: [facility] IN[facility];  Service: Cardiovascular;  Laterality: N/A;   BREAST BIOPSY Right 1979   benign    Cyst removed from ovary     KNEE SURGERY     right duda    LEFT HEART CATH AND CORONARY ANGIOGRAPHY N/A 10/31/2018   Procedure: LEFT HEART CATH AND CORONARY ANGIOGRAPHY;  Surgeon: Valerie Debby LABOR, MD;  Location: [facility] INVASIVE CV [facility];  Service: Cardiovascular;  Laterality: N/A;   TONSILLECTOMY AND ADENOIDECTOMY  1073   TOTAL KNEE ARTHROPLASTY 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   Procedure: TOTAL KNEE ARTHROPLASTY;  Surgeon: Valerie LULLA Sage, MD;  Location: [facility] OR;   Service: Orthopedics;  Laterality: Right;   TUBAL LIGATION     WISDOM TOOTH EXTRACTION     Patient Active Problem List   Diagnosis Date Noted   Paroxysmal atrial fibrillation (HCC) 06/05/2023   Asthma in adult 10/25/2022   Hypercoagulable state due to paroxysmal atrial fibrillation (HCC) 11/16/2021   Atrial fibrillation with RVR (HCC) 11/10/2021   Atrial fibrillation with rapid ventricular response (HCC) 11/09/2021   Acute systolic heart failure (HCC) 11/01/2018   NSTEMI (non-ST elevated myocardial infarction) (HCC) 10/30/2018   Hyperlipidemia 06/13/2018   Lactose intolerance 06/13/2018   S/P total knee arthroplasty, right 03/16/2016   Unilateral primary osteoarthritis, right knee    Special screening for malignant neoplasms, colon 12/29/2014   Diarrhea 12/29/2014   Medication management 05/13/2013   Does not have health insurance 05/13/2013   Adjustment reaction with anxiety and depression 03/12/2012   Medication monitoring encounter 03/12/2012   Hemorrhoids 08/13/2011   Perineal itching, female 08/13/2011   LBBB (left bundle branch block) 09/24/2010   Murmur 09/08/2010   EKG, abnormal 08/19/2010   Visit for preventive health examination 08/19/2010   Palpitations 08/18/2010   Dizziness 08/18/2010   Arthritis 08/18/2010  Anxiety state 06/18/2008   GRIEF REACTION 06/18/2008   DEPRESSION, MAJOR, RECURRENT 07/06/2006   ARTHRALGIA, UNSPECIFIED 07/06/2006    PCP: Valerie Heron HERO, MD   REFERRING PROVIDER: Harden Valerie GAILS, MD   REFERRING DIAG:  Diagnosis  [phone] (ICD-10-CM) - Degeneration of intervertebral disc of lumbar region with discogenic back pain and lower extremity pain    Rationale for Evaluation and Treatment: Rehabilitation  THERAPY DIAG:  Abnormal posture  Other muscle spasm  Muscle weakness (generalized)  Other low back pain  Radiculopathy, lumbar region  ONSET DATE: 7/25  SUBJECTIVE:                                                                                                                                                                                            SUBJECTIVE STATEMENT: Pt states in July her L LE started getting pain, tingling, numbness.  She also had left lumbar pain.  She had an xray of the hip and and it was ruled out.  She had medication to decrease pain and feels better.  PERTINENT HISTORY:  No spinal surgeries  PAIN:  Are you having pain? Yes: NPRS scale: July 8/10 today 3/10 Pain location: L LE posterior/lateral and L LBP Pain description: tingling, numbness, sharp Aggravating factors: walking, yardwork, prolonged standing/sitting Relieving factors: medication, heat  PRECAUTIONS: None  RED FLAGS: None   WEIGHT BEARING RESTRICTIONS: No  FALLS:  Has patient fallen in last 6 months? No  LIVING ENVIRONMENT: Lives with: lives with their family and lives alone Lives in: House/apartment   OCCUPATION: Financial trader for [employer]  PLOF: Independent  PATIENT GOALS: walking without pain  NEXT MD VISIT: unknown  OBJECTIVE:  Note: Objective measures were completed at Evaluation unless otherwise noted.  DIAGNOSTIC FINDINGS:  12/12/23  2 view radiographs of the lumbar spine shows a degenerative scoliosis with  complete collapse of the disc space of the lumbar spine with periarticular  bony spurs.   PATIENT SURVEYS:  PSFS: THE PATIENT SPECIFIC FUNCTIONAL SCALE  Place score of 0-10 (0 = unable to perform activity and 10 = able to perform activity at the same level as before injury or problem)  Activity Date: 01/26/24    Walking for 20 min 5    2.standing for 30 min 4    3.yardwork for 30 min 5    4.      Total Score 4.66      Total Score = Sum of activity scores/number of activities  Minimally Detectable Change: 3 points (for single activity); 2 points (for average score)  [facility] (nd). The Patient Specific Functional Scale . Retrieved from  [email or web address]   COGNITION: Overall cognitive status: Within functional limits for tasks assessed     SENSATION: WFL  MUSCLE LENGTH: Hamstrings: Right/Left mod restriction deg   POSTURE: scap winging L , L shoulder higher, flattened T/L spine,dec axilla space R, L genu valgum   PALPATION: 1+ tenderness at L GM, TFL and pvm  LUMBAR ROM:   AROM eval  Flexion 75%  Extension WNL  Right lateral flexion 50%  Left lateral flexion 50%  Right rotation 75%  Left rotation WNL   (Blank rows = not tested)  LOWER EXTREMITY ROM:   WFL throughout  A/P ROM Right eval Left eval  Hip flexion    Hip extension    Hip abduction    Hip adduction    Hip internal rotation    Hip external rotation    Knee flexion    Knee extension    Ankle dorsiflexion    Ankle plantarflexion    Ankle inversion    Ankle eversion     (Blank rows = not tested)  LOWER EXTREMITY MMT:    MMT Right eval Left eval  Hip flexion  4  Hip extension    Hip abduction  4  Hip adduction    Hip internal rotation    Hip external rotation    Knee flexion    Knee extension    Ankle dorsiflexion 5- 5-  Ankle plantarflexion    Ankle inversion    Ankle eversion     (Blank rows = not tested)  LUMBAR SPECIAL TESTS:  SLR L negative  FUNCTIONAL TESTS:  5 times sit to stand: 13 sec no UE use  GAIT: Distance walked: 20 min community Assistive device utilized: None Level of assistance: Complete Independence   TREATMENT DATE:  01/26/24 Initial evaluation completed followed by trial set of HEP.                                                                                                                                  PATIENT EDUCATION:  Education details: HEP Person educated: Patient Education method: Explanation, Demonstration, Actor cues, and Verbal cues Education comprehension: verbalized understanding, returned demonstration, and verbal  cues required  HOME EXERCISE PROGRAM: Access Code: [email or web address] URL: [email or web address] Date: 01/26/2024 Prepared by: Valerie Meth  Exercises - Supine Posterior Pelvic Tilt  - 2 x daily - 7 x weekly - 2 sets - 10 reps - 3 hold - Supine Bridge  - 2 x daily - 7 x weekly - 2 sets - 10 reps - 1 hold - Lower Trunk Rotations  - 2 x daily - 7 x weekly - 2 sets - 10 reps - 2 hold - Supine Double Knee to Chest  - 2 x daily - 7 x weekly - 1 sets - 4 reps - 25 hold  ASSESSMENT:  CLINICAL IMPRESSION: Patient is a 68 y.o. female who was seen today for physical therapy evaluation and treatment for  L LE and lumbar pain. She presents with altered posture, decreased ROM, decreased strength, antalgic gait and pain.   She would benefit from skilled PT to address these deficits and return to previous LOA.  OBJECTIVE IMPAIRMENTS: decreased balance, decreased endurance, decreased mobility, difficulty walking, decreased ROM, decreased strength, impaired flexibility, and pain.   ACTIVITY LIMITATIONS: bending, sitting, standing, squatting, transfers, and caring for others  PARTICIPATION LIMITATIONS: meal prep, community activity, and yard work  PERSONAL FACTORS: Age are also affecting patient's functional outcome.   REHAB POTENTIAL: Good  CLINICAL DECISION MAKING: Stable/uncomplicated  EVALUATION COMPLEXITY: Moderate   GOALS: Goals reviewed with patient? Yes  SHORT TERM GOALS: Target date: 02/23/2024    Pt to be independent with HEP. Baseline: Goal status: INITIAL  2.  Decrease level of pain by 1 grade. Baseline:  Goal status: INITIAL  LONG TERM GOALS: Target date: 03/22/2024    Pt to be independent with self progressive HEP by discharge. Baseline:  Goal status: INITIAL  2.  Decrease pain to 2/10 max with all activities. Baseline:  Goal status: INITIAL  3.  Increase AROM by 25%. Baseline:  Goal status: INITIAL  4.  Increase strength by 1/2 to 1 full grade where  affected. Baseline:  Goal status: INITIAL  5.  Improve PSFS by at least 2 points for measurable difference. Baseline:  Goal status: INITIAL  6.  Able to stand, ambulate or do yardwork for at least 45 minutes with minimal to no pain. Baseline: 20 min Goal status: INITIAL  PLAN:  PT FREQUENCY: 2x/week  PT DURATION: 8 weeks  PLANNED INTERVENTIONS: 97164- PT Re-evaluation, 97110-Therapeutic exercises, 97530- Therapeutic activity, 97112- Neuromuscular re-education, 97535- Self Care, 02859- Manual therapy, [phone]- Gait training, [phone]- Aquatic Therapy, [phone]- Electrical stimulation (unattended), Patient/Family education, Balance training, Joint mobilization, Spinal mobilization, Cryotherapy, and Moist heat.  PLAN FOR NEXT SESSION: Start on Nustep or Rec bike and review HEP.   Valerie CHRISTELLA Meth, PT 01/26/2024, 12:22 PM

## 2024-01-26 ENCOUNTER — Other Ambulatory Visit: Payer: Self-pay

## 2024-01-26 ENCOUNTER — Ambulatory Visit

## 2024-01-26 DIAGNOSIS — M6281 Muscle weakness (generalized): Secondary | ICD-10-CM | POA: Diagnosis not present

## 2024-01-26 DIAGNOSIS — R293 Abnormal posture: Secondary | ICD-10-CM

## 2024-01-26 DIAGNOSIS — M62838 Other muscle spasm: Secondary | ICD-10-CM

## 2024-01-26 DIAGNOSIS — M5416 Radiculopathy, lumbar region: Secondary | ICD-10-CM | POA: Diagnosis not present

## 2024-01-26 DIAGNOSIS — M5459 Other low back pain: Secondary | ICD-10-CM | POA: Diagnosis not present

## 2024-01-30 DIAGNOSIS — Z4689 Encounter for fitting and adjustment of other specified devices: Secondary | ICD-10-CM | POA: Diagnosis not present

## 2024-02-05 NOTE — Therapy (Signed)
 OUTPATIENT PHYSICAL THERAPY THORACOLUMBAR TREATMENT   Patient Name: Valerie Love MRN: [id number] DOB:14-Mar-1956, 68 y.o., female Today's Date: 02/06/2024  END OF SESSION:  PT End of Session - 02/06/24 1009     Visit Number 2    Number of Visits 17    Date for Recertification  03/22/24    PT Start Time 0925    PT Stop Time 1003    PT Time Calculation (min) 38 min    Activity Tolerance Patient tolerated treatment well    Behavior During Therapy [facility] for tasks assessed/performed           Past Medical History:  Diagnosis Date   Anxiety    Arthritis    R knee, back, hands    Cellulitis of knee 04/2016   RT KNEE   Cellulitis of right knee 04/18/2016   Childhood asthma    Complication of anesthesia    woke up slowly - 27yrs. ago   Depression    related to husband illness and death   History of jaundice    as a child     Hx of varicella    Hyperthyroidism    during pregnancy, treated & resolved post partum    IBS (irritable bowel syndrome)    LBBB (left bundle branch block)    Scoliosis    Scoliosis 07/2015   Thyroid  disease in pregnancy    TOBACCO DEPENDENCE 07/06/2006   Qualifier: Diagnosis of  By: Manford Longs     Troponin level elevated    Past Surgical History:  Procedure Laterality Date   ATRIAL FIBRILLATION ABLATION N/A 08/11/2022   Procedure: ATRIAL FIBRILLATION ABLATION;  Surgeon: Inocencio Soyla Lunger, MD;  Location: [facility] INV[facility];  Service: Cardiovascular;  Laterality: N/A;   BREAST BIOPSY Right 1979   benign    Cyst removed from ovary     KNEE SURGERY     right duda    LEFT HEART CATH AND CORONARY ANGIOGRAPHY N/A 10/31/2018   Procedure: LEFT HEART CATH AND CORONARY ANGIOGRAPHY;  Surgeon: Burnard Debby LABOR, MD;  Location: [facility];  Service: Cardiovascular;  Laterality: N/A;   TONSILLECTOMY AND ADENOIDECTOMY  1073   TOTAL KNEE ARTHROPLASTY 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   Procedure: TOTAL KNEE ARTHROPLASTY;  Surgeon: Jerona LULLA Sage, MD;  Location: [facility]  OR;  Service: Orthopedics;  Laterality: Right;   TUBAL LIGATION     WISDOM TOOTH EXTRACTION     Patient Active Problem List   Diagnosis Date Noted   Paroxysmal atrial fibrillation (HCC) 06/05/2023   Asthma in adult 10/25/2022   Hypercoagulable state due to paroxysmal atrial fibrillation (HCC) 11/16/2021   Atrial fibrillation with RVR (HCC) 11/10/2021   Atrial fibrillation with rapid ventricular response (HCC) 11/09/2021   Acute systolic heart failure (HCC) 11/01/2018   NSTEMI (non-ST elevated myocardial infarction) (HCC) 10/30/2018   Hyperlipidemia 06/13/2018   Lactose intolerance 06/13/2018   S/P total knee arthroplasty, right 03/16/2016   Unilateral primary osteoarthritis, right knee    Special screening for malignant neoplasms, colon 12/29/2014   Diarrhea 12/29/2014   Medication management 05/13/2013   Does not have health insurance 05/13/2013   Adjustment reaction with anxiety and depression 03/12/2012   Medication monitoring encounter 03/12/2012   Hemorrhoids 08/13/2011   Perineal itching, female 08/13/2011   LBBB (left bundle branch block) 09/24/2010   Murmur 09/08/2010   EKG, abnormal 08/19/2010   Visit for preventive health examination 08/19/2010   Palpitations 08/18/2010   Dizziness 08/18/2010   Arthritis 08/18/2010  Anxiety state 06/18/2008   GRIEF REACTION 06/18/2008   DEPRESSION, MAJOR, RECURRENT 07/06/2006   ARTHRALGIA, UNSPECIFIED 07/06/2006    PCP: Ozell Heron HERO, MD   REFERRING PROVIDER: Harden Jerona GAILS, MD   REFERRING DIAG:  Diagnosis  [phone] (ICD-10-CM) - Degeneration of intervertebral disc of lumbar region with discogenic back pain and lower extremity pain    Rationale for Evaluation and Treatment: Rehabilitation  THERAPY DIAG:  Abnormal posture  Other muscle spasm  Muscle weakness (generalized)  Other low back pain  Radiculopathy, lumbar region  ONSET DATE: 7/25  SUBJECTIVE:                                                                                                                                                                                            SUBJECTIVE STATEMENT: Pt reports doing HEP.   Pt reports decreasing taking Prednisone  last 2 days.    PERTINENT HISTORY:  No spinal surgeries  PAIN:  Are you having pain? Yes: NPRS scale: July 8/10 today 4/10 Pain location: L LE posterior/lateral and L LBP Pain description: tingling, numbness, sharp Aggravating factors: walking, yardwork, prolonged standing/sitting Relieving factors: medication, heat  PRECAUTIONS: None  RED FLAGS: None   WEIGHT BEARING RESTRICTIONS: No  FALLS:  Has patient fallen in last 6 months? No  LIVING ENVIRONMENT: Lives with: lives with their family and lives alone Lives in: House/apartment   OCCUPATION: Financial trader for [employer]  PLOF: Independent  PATIENT GOALS: walking without pain  NEXT MD VISIT: unknown  OBJECTIVE:  Note: Objective measures were completed at Evaluation unless otherwise noted.  DIAGNOSTIC FINDINGS:  12/12/23  2 view radiographs of the lumbar spine shows a degenerative scoliosis with  complete collapse of the disc space of the lumbar spine with periarticular  bony spurs.   PATIENT SURVEYS:  PSFS: THE PATIENT SPECIFIC FUNCTIONAL SCALE  Place score of 0-10 (0 = unable to perform activity and 10 = able to perform activity at the same level as before injury or problem)  Activity Date: 01/26/24    Walking for 20 min 5    2.standing for 30 min 4    3.yardwork for 30 min 5    4.      Total Score 4.66      Total Score = Sum of activity scores/number of activities  Minimally Detectable Change: 3 points (for single activity); 2 points (for average score)  [facility] (nd). The Patient Specific Functional Scale . Retrieved from [email or web address]   COGNITION: Overall cognitive status: Within functional limits for tasks  assessed     SENSATION: WFL  MUSCLE LENGTH: Hamstrings: Right/Left mod restriction  deg   POSTURE: scap winging L , L shoulder higher, flattened T/L spine,dec axilla space R, L genu valgum   PALPATION: 1+ tenderness at L GM, TFL and pvm  LUMBAR ROM:   AROM eval  Flexion 75%  Extension WNL  Right lateral flexion 50%  Left lateral flexion 50%  Right rotation 75%  Left rotation WNL   (Blank rows = not tested)  LOWER EXTREMITY ROM:   WFL throughout  A/P ROM Right eval Left eval  Hip flexion    Hip extension    Hip abduction    Hip adduction    Hip internal rotation    Hip external rotation    Knee flexion    Knee extension    Ankle dorsiflexion    Ankle plantarflexion    Ankle inversion    Ankle eversion     (Blank rows = not tested)  LOWER EXTREMITY MMT:    MMT Right eval Left eval  Hip flexion  4  Hip extension    Hip abduction  4  Hip adduction    Hip internal rotation    Hip external rotation    Knee flexion    Knee extension    Ankle dorsiflexion 5- 5-  Ankle plantarflexion    Ankle inversion    Ankle eversion     (Blank rows = not tested)  LUMBAR SPECIAL TESTS:  SLR L negative  FUNCTIONAL TESTS:  5 times sit to stand: 13 sec no UE use  GAIT: Distance walked: 20 min community Assistive device utilized: None Level of assistance: Complete Independence   TREATMENT DATE: Lumbar 02/06/24  There Ex for ROM/strengthening  Rec bike 6 min level 2 Leg press 75# 3x10 SKTC  25 sec 3x each side Lumbar rotations 2x20 Neuro Re ed for posture/stabilization PPT 2x10 Bridges 2x10 Tband green hip abd 3x10 Manual Percussive device to lumbar pvm  01/26/24 Initial evaluation completed followed by trial set of HEP.                                                                                                                                  PATIENT EDUCATION:  Education details: HEP Person educated: Patient Education method: Explanation,  Demonstration, Actor cues, and Verbal cues Education comprehension: verbalized understanding, returned demonstration, and verbal cues required  HOME EXERCISE PROGRAM: Access Code: [email or web address] URL: [email or web address] Date: 01/26/2024 Prepared by: Burnard Meth  Exercises - Supine Posterior Pelvic Tilt  - 2 x daily - 7 x weekly - 2 sets - 10 reps - 3 hold - Supine Bridge  - 2 x daily - 7 x weekly - 2 sets - 10 reps - 1 hold - Lower Trunk Rotations  - 2 x daily - 7 x weekly - 2 sets - 10 reps - 2 hold - Supine Double Knee to Chest  - 2 x daily - 7 x weekly - 1 sets - 4 reps - 25 hold  ASSESSMENT:  CLINICAL IMPRESSION: Patient needed VC for PPT form.  Demonstrated understanding.    OBJECTIVE IMPAIRMENTS: decreased balance, decreased endurance, decreased mobility, difficulty walking, decreased ROM, decreased strength, impaired flexibility, and pain.   ACTIVITY LIMITATIONS: bending, sitting, standing, squatting, transfers, and caring for others  PARTICIPATION LIMITATIONS: meal prep, community activity, and yard work  PERSONAL FACTORS: Age are also affecting patient's functional outcome.   REHAB POTENTIAL: Good  CLINICAL DECISION MAKING: Stable/uncomplicated  EVALUATION COMPLEXITY: Moderate   GOALS: Goals reviewed with patient? Yes  SHORT TERM GOALS: Target date: 02/23/2024    Pt to be independent with HEP. Baseline: Goal status: INITIAL  2.  Decrease level of pain by 1 grade. Baseline:  Goal status: INITIAL  LONG TERM GOALS: Target date: 03/22/2024    Pt to be independent with self progressive HEP by discharge. Baseline:  Goal status: INITIAL  2.  Decrease pain to 2/10 max with all activities. Baseline:  Goal status: INITIAL  3.  Increase AROM by 25%. Baseline:  Goal status: INITIAL  4.  Increase strength by 1/2 to 1 full grade where affected. Baseline:  Goal status: INITIAL  5.  Improve PSFS by at least 2 points for measurable  difference. Baseline:  Goal status: INITIAL  6.  Able to stand, ambulate or do yardwork for at least 45 minutes with minimal to no pain. Baseline: 20 min Goal status: INITIAL  PLAN:  PT FREQUENCY: 2x/week  PT DURATION: 8 weeks  PLANNED INTERVENTIONS: 97164- PT Re-evaluation, 97110-Therapeutic exercises, 97530- Therapeutic activity, 97112- Neuromuscular re-education, 97535- Self Care, 02859- Manual therapy, [phone]- Gait training, [phone]- Aquatic Therapy, [phone]- Electrical stimulation (unattended), Patient/Family education, Balance training, Joint mobilization, Spinal mobilization, Cryotherapy, and Moist heat.  PLAN FOR NEXT SESSION: Increase stabilization exercises as tolerated.   Burnard CHRISTELLA Meth, PT 02/06/2024, 10:10 AM

## 2024-02-06 ENCOUNTER — Ambulatory Visit

## 2024-02-06 DIAGNOSIS — M6281 Muscle weakness (generalized): Secondary | ICD-10-CM | POA: Diagnosis not present

## 2024-02-06 DIAGNOSIS — M5416 Radiculopathy, lumbar region: Secondary | ICD-10-CM

## 2024-02-06 DIAGNOSIS — M62838 Other muscle spasm: Secondary | ICD-10-CM

## 2024-02-06 DIAGNOSIS — R293 Abnormal posture: Secondary | ICD-10-CM

## 2024-02-06 DIAGNOSIS — M5459 Other low back pain: Secondary | ICD-10-CM

## 2024-02-08 ENCOUNTER — Ambulatory Visit (INDEPENDENT_AMBULATORY_CARE_PROVIDER_SITE_OTHER): Admitting: Orthopedic Surgery

## 2024-02-08 ENCOUNTER — Encounter: Payer: Self-pay | Admitting: Orthopedic Surgery

## 2024-02-08 DIAGNOSIS — M5442 Lumbago with sciatica, left side: Secondary | ICD-10-CM

## 2024-02-08 DIAGNOSIS — M51362 Other intervertebral disc degeneration, lumbar region with discogenic back pain and lower extremity pain: Secondary | ICD-10-CM | POA: Diagnosis not present

## 2024-02-08 MED ORDER — METHOCARBAMOL 500 MG PO TABS: 500.0000 mg | ORAL_TABLET | Freq: Three times a day (TID) | ORAL | 0 refills | Status: AC | PRN

## 2024-02-08 NOTE — Progress Notes (Signed)
 Office Visit Note   Patient: Valerie Love           Date of Birth: 06-23-1955           MRN: [id number] Visit Date: 02/08/2024              Requested by: Ozell Heron HERO, MD [address] [place],  [place] [address] PCP: Ozell Heron HERO, MD  Chief Complaint  Patient presents with   Lower Back - Pain, Follow-up      HPI: Discussed the use of AI scribe software for clinical note transcription with the patient, who gave verbal consent to proceed.  History of Present Illness Valerie Love is a 68 year old female who presents for follow-up after discontinuing prednisone .  She experiences persistent lower back pain, particularly during activities such as walking or standing for extended periods. The pain is described as 'achy' and is accompanied by muscle spasms. Additionally, she has started experiencing discomfort in her shoulder.  She discontinued prednisone  on her own due to adverse effects. Currently, she is taking Robaxin , a muscle relaxant, at night, which helps her sleep and reduces muscle spasms during the day. She has a few doses left.  She has completed six weeks of physical therapy, although her symptoms persist.     Assessment & Plan: Visit Diagnoses:  1. Degeneration of intervertebral disc of lumbar region with discogenic back pain and lower extremity pain   2. Acute left-sided low back pain with left-sided sciatica     Plan: Assessment and Plan Assessment & Plan Chronic low back pain with muscle spasms Chronic low back pain with muscle spasms persists despite physical therapy. Discontinued prednisone  due to side effects. Robaxin  aids sleep and reduces spasms. MRI considered to evaluate pain source and potential injection by Dr. Eldonna. - Refill Robaxin  prescription. - Order MRI of the back at [facility] on [address] in [place]. - Continue physical therapy. - Follow up after MRI scan.      Follow-Up Instructions: No  follow-ups on file.   Ortho Exam  Patient is alert, oriented, no adenopathy, well-dressed, normal affect, normal respiratory effort. Physical Exam NEUROLOGICAL: Gait normal. No focal motor weakness. Negative straight leg raise.    Imaging: No results found. No images are attached to the encounter.  Labs: Lab Results  Component Value Date   HGBA1C 5.6 10/05/2023   HGBA1C 5.6 06/22/2021   HGBA1C 5.5 08/28/2020   ESRSEDRATE 10 06/22/2021   ESRSEDRATE 17 04/18/2016   ESRSEDRATE 9 08/18/2010   CRP <1.0 06/22/2021   CRP 0.8 04/18/2016   GRAMSTAIN No WBC Seen 04/18/2016   GRAMSTAIN No Squamous Epithelial Cells Seen 04/18/2016   GRAMSTAIN No Organisms Seen 04/18/2016   LABORGA Few GROUP B STREP (S.AGALACTIAE) ISOLATED 04/18/2016     Lab Results  Component Value Date   ALBUMIN 4.4 10/05/2023   ALBUMIN 4.1 10/25/2022   ALBUMIN 4.2 06/22/2021    Lab Results  Component Value Date   MG 2.0 03/07/2023   MG 1.9 11/09/2021   MG 2.3 11/12/2018   No results found for: VD25OH  No results found for: PREALBUMIN    Latest Ref Rng & Units 10/03/2023   10:59 AM 03/07/2023    5:42 PM 07/28/2022   10:40 AM  CBC EXTENDED  WBC 3.4 - 10.8 x10E3/uL 7.0  8.5  4.9   RBC 3.77 - 5.28 x10E6/uL 4.70  4.72  4.50   Hemoglobin 11.1 - 15.9 g/dL 85.8  85.2  13.9   HCT 34.0 - 46.6 % 45.4  43.6  43.2   Platelets 150 - 450 x10E3/uL 319  274  258      There is no height or weight on file to calculate BMI.  Orders:  Orders Placed This Encounter  Procedures   MR Lumbar Spine w/o contrast   Meds ordered this encounter  Medications   methocarbamol  (ROBAXIN ) 500 MG tablet    Sig: Take 1 tablet (500 mg total) by mouth every 8 (eight) hours as needed for muscle spasms.    Dispense:  30 tablet    Refill:  0     Procedures: No procedures performed  Clinical Data: No additional findings.  ROS:  All other systems negative, except as noted in the HPI. Review of  Systems  Objective: Vital Signs: There were no vitals taken for this visit.  Specialty Comments:  EXAM: MRI CERVICAL SPINE WITHOUT CONTRAST   TECHNIQUE: Multiplanar, multisequence MR imaging of the cervical spine was performed. No intravenous contrast was administered.   COMPARISON:  Radiographs of the cervical spine 03/16/2020 (images available, report unavailable).   FINDINGS: Mild intermittent motion degradation.   Alignment: Reversal of the expected cervical lordosis. 2 mm C2-C3 grade 1 anterolisthesis. 2-3 mm C3-C4 grade 1 anterolisthesis.   Vertebrae: Vertebral body height is maintained. Mild degenerative endplate edema at R5-R4, C5-C6, C6-C7 and C7-T1. Marrow edema within the bilateral posterior elements at C2-C3 and on the left at C3-C4, likely degenerative and related to facet arthrosis.   Cord: No signal abnormality identified within the cervical spinal cord   Posterior Fossa, vertebral arteries, paraspinal tissues: No abnormality identified within included portions of the posterior fossa. Flow voids preserved within the imaged cervical vertebral arteries. Paraspinal soft tissues unremarkable.   Disc levels:   Multilevel disc space narrowing, greatest at C4-C5 (moderate), C5-C6 (moderate/advanced), C6-C7 (moderate/advanced) and C7-T1 (moderate/advanced).   C2-C3: Grade 1 anterolisthesis. Slight disc uncovering. Facet arthrosis on the right. No significant spinal canal or foraminal stenosis.   C3-C4: Grade 1 anterolisthesis. Slight disc uncovering and disc bulge. Uncovertebral hypertrophy on the left. Facet arthrosis (greater on the left and fairly advanced on the left). No significant spinal canal stenosis. Severe left neural foraminal narrowing.   C4-C5: Disc bulge. Endplate spurring with bilateral disc osteophyte ridge/uncinate hypertrophy. A disc bulge effaces the ventral thecal sac, contacting and minimally flattening the ventral spinal  cord. However, the dorsal CSF space is maintained within the spinal canal. Bilateral neural foraminal narrowing (mild right, severe left).   C5-C6: Disc bilateral disc osteophyte ridge/uncinate hypertrophy. Mild-to-moderate spinal canal stenosis. The disc bulge contacts and minimally flattens the ventral aspect of the spinal cord. Bilateral neural foraminal narrowing (severe right, mild left).   C6-C7: Disc bulge. Bilateral disc osteophyte ridge/uncinate hypertrophy. Mild spinal canal stenosis (without significant spinal cord mass effect). Severe bilateral neural foraminal narrowing.   C7-T1: Disc bulge. Right-sided disc osteophyte ridge. Facet arthrosis. No significant spinal canal stenosis. Mild right neural foraminal narrowing.   IMPRESSION: Cervical spondylosis, as outlined and with findings most notably as follows.   At C5-C6, there is moderate/advanced disc degeneration. Disc bulge. Bilateral disc osteophyte ridge/uncinate hypertrophy. Mild to moderate spinal canal narrowing. The disc bulge contacts and minimally flattens the ventral aspect of the spinal cord. Bilateral neural foraminal narrowing (severe right, mild left).   At C4-C5, there is moderate disc degeneration. Disc bulge with bilateral disc osteophyte ridge/uncinate hypertrophy. The disc bulge contacts and minimally flattens the ventral spinal cord. However,  the dorsal CSF space is maintained within the spinal canal. Bilateral neural foraminal narrowing (mild right, severe left).   No more than mild spinal canal narrowing at the remaining levels. Additional sites of foraminal stenosis, as detailed and greatest on the left at C3-C4 (severe) and bilaterally at C6-C7 (severe).   Also of note, disc degeneration is moderate/advanced at C6-C7 and C7-T1.   Degenerative marrow edema as described.   Nonspecific reversal of expected cervical lordosis. Grade 1 anterolisthesis at C2-C3 and C3-C4.     Electronically  Signed   By: Rockey Childs D.O.   On: 07/15/2021 07:41  PMFS History: Patient Active Problem List   Diagnosis Date Noted   Paroxysmal atrial fibrillation (HCC) 06/05/2023   Asthma in adult 10/25/2022   Hypercoagulable state due to paroxysmal atrial fibrillation (HCC) 11/16/2021   Atrial fibrillation with RVR (HCC) 11/10/2021   Atrial fibrillation with rapid ventricular response (HCC) 11/09/2021   Acute systolic heart failure (HCC) 11/01/2018   NSTEMI (non-ST elevated myocardial infarction) (HCC) 10/30/2018   Hyperlipidemia 06/13/2018   Lactose intolerance 06/13/2018   S/P total knee arthroplasty, right 03/16/2016   Unilateral primary osteoarthritis, right knee    Special screening for malignant neoplasms, colon 12/29/2014   Diarrhea 12/29/2014   Medication management 05/13/2013   Does not have health insurance 05/13/2013   Adjustment reaction with anxiety and depression 03/12/2012   Medication monitoring encounter 03/12/2012   Hemorrhoids 08/13/2011   Perineal itching, female 08/13/2011   LBBB (left bundle branch block) 09/24/2010   Murmur 09/08/2010   EKG, abnormal 08/19/2010   Visit for preventive health examination 08/19/2010   Palpitations 08/18/2010   Dizziness 08/18/2010   Arthritis 08/18/2010   Anxiety state 06/18/2008   GRIEF REACTION 06/18/2008   DEPRESSION, MAJOR, RECURRENT 07/06/2006   ARTHRALGIA, UNSPECIFIED 07/06/2006   Past Medical History:  Diagnosis Date   Anxiety    Arthritis    R knee, back, hands    Cellulitis of knee 04/2016   RT KNEE   Cellulitis of right knee 04/18/2016   Childhood asthma    Complication of anesthesia    woke up slowly - 12yrs. ago   Depression    related to husband illness and death   History of jaundice    as a child     Hx of varicella    Hyperthyroidism    during pregnancy, treated & resolved post partum    IBS (irritable bowel syndrome)    LBBB (left bundle branch block)    Scoliosis    Scoliosis 07/2015   Thyroid   disease in pregnancy    TOBACCO DEPENDENCE 07/06/2006   Qualifier: Diagnosis of  By: Manford Longs     Troponin level elevated     Family History  Problem Relation Age of Onset   Alcohol abuse Mother        died from stomach ulcers   Stroke Father        died  from cva and mi   Hypertension Father    Hyperlipidemia Father    Coronary artery disease Father        Age 31   Colon polyps Sister    Hepatitis C Brother    Colon cancer Neg Hx     Past Surgical History:  Procedure Laterality Date   ATRIAL FIBRILLATION ABLATION N/A 08/11/2022   Procedure: ATRIAL FIBRILLATION ABLATION;  Surgeon: Inocencio Soyla Lunger, MD;  Location: [facility];  Service: Cardiovascular;  Laterality: N/A;   BREAST BIOPSY  Right 1979   benign    Cyst removed from ovary     KNEE SURGERY     right Alyia Lacerte    LEFT HEART CATH AND CORONARY ANGIOGRAPHY N/A 10/31/2018   Procedure: LEFT HEART CATH AND CORONARY ANGIOGRAPHY;  Surgeon: Burnard Debby LABOR, MD;  Location: [facility];  Service: Cardiovascular;  Laterality: N/A;   TONSILLECTOMY AND ADENOIDECTOMY  1073   TOTAL KNEE ARTHROPLASTY 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   Procedure: TOTAL KNEE ARTHROPLASTY;  Surgeon: Jerona LULLA Sage, MD;  Location: [facility] OR;  Service: Orthopedics;  Laterality: Right;   TUBAL LIGATION     WISDOM TOOTH EXTRACTION     Social History   Occupational History   Occupation: self    Comment: Geneticist, molecular  Tobacco Use   Smoking status: Former    Current packs/day: 0.00    Average packs/day: 0.3 packs/day for 40.0 years (10.0 ttl pk-yrs)    Types: Cigarettes    Start date: 01/06/1976    Quit date: 01/06/2016    Years since quitting: 8.0   Smokeless tobacco: Never   Tobacco comments:    Former smoker 11/16/21  Substance and Sexual Activity   Alcohol use: Yes    Alcohol/week: 7.0 standard drinks of alcohol    Types: 7 Glasses of wine per week    Comment: 1 glass of wine daily 11/16/21   Drug use: No   Sexual activity: Not on file

## 2024-02-12 NOTE — Therapy (Signed)
 OUTPATIENT PHYSICAL THERAPY THORACOLUMBAR TREATMENT   Patient Name: Valerie Love MRN: [id number] DOB:April 24, 1956, 68 y.o., female Today's Date: 02/13/2024  END OF SESSION:  PT End of Session - 02/13/24 1306     Visit Number 3    Number of Visits 17    Date for Recertification  03/22/24    PT Start Time 1015    PT Stop Time 1053    PT Time Calculation (min) 38 min    Activity Tolerance Patient tolerated treatment well    Behavior During Therapy [facility] for tasks assessed/performed            Past Medical History:  Diagnosis Date   Anxiety    Arthritis    R knee, back, hands    Cellulitis of knee 04/2016   RT KNEE   Cellulitis of right knee 04/18/2016   Childhood asthma    Complication of anesthesia    woke up slowly - 58yrs. ago   Depression    related to husband illness and death   History of jaundice    as a child     Hx of varicella    Hyperthyroidism    during pregnancy, treated & resolved post partum    IBS (irritable bowel syndrome)    LBBB (left bundle branch block)    Scoliosis    Scoliosis 07/2015   Thyroid  disease in pregnancy    TOBACCO DEPENDENCE 07/06/2006   Qualifier: Diagnosis of  By: Manford Longs     Troponin level elevated    Past Surgical History:  Procedure Laterality Date   ATRIAL FIBRILLATION ABLATION N/A 08/11/2022   Procedure: ATRIAL FIBRILLATION ABLATION;  Surgeon: Inocencio Soyla Lunger, MD;  Location: [facility];  Service: Cardiovascular;  Laterality: N/A;   BREAST BIOPSY Right 1979   benign    Cyst removed from ovary     KNEE SURGERY     right duda    LEFT HEART CATH AND CORONARY ANGIOGRAPHY N/A 10/31/2018   Procedure: LEFT HEART CATH AND CORONARY ANGIOGRAPHY;  Surgeon: Burnard Debby LABOR, MD;  Location: [facility];  Service: Cardiovascular;  Laterality: N/A;   TONSILLECTOMY AND ADENOIDECTOMY  1073   TOTAL KNEE ARTHROPLASTY 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   Procedure: TOTAL KNEE ARTHROPLASTY;  Surgeon: Jerona LULLA Sage, MD;  Location: [facility]  OR;  Service: Orthopedics;  Laterality: Right;   TUBAL LIGATION     WISDOM TOOTH EXTRACTION     Patient Active Problem List   Diagnosis Date Noted   Paroxysmal atrial fibrillation (HCC) 06/05/2023   Asthma in adult 10/25/2022   Hypercoagulable state due to paroxysmal atrial fibrillation (HCC) 11/16/2021   Atrial fibrillation with RVR (HCC) 11/10/2021   Atrial fibrillation with rapid ventricular response (HCC) 11/09/2021   Acute systolic heart failure (HCC) 11/01/2018   NSTEMI (non-ST elevated myocardial infarction) (HCC) 10/30/2018   Hyperlipidemia 06/13/2018   Lactose intolerance 06/13/2018   S/P total knee arthroplasty, right 03/16/2016   Unilateral primary osteoarthritis, right knee    Special screening for malignant neoplasms, colon 12/29/2014   Diarrhea 12/29/2014   Medication management 05/13/2013   Does not have health insurance 05/13/2013   Adjustment reaction with anxiety and depression 03/12/2012   Medication monitoring encounter 03/12/2012   Hemorrhoids 08/13/2011   Perineal itching, female 08/13/2011   LBBB (left bundle branch block) 09/24/2010   Murmur 09/08/2010   EKG, abnormal 08/19/2010   Visit for preventive health examination 08/19/2010   Palpitations 08/18/2010   Dizziness 08/18/2010   Arthritis  08/18/2010   Anxiety state 06/18/2008   GRIEF REACTION 06/18/2008   DEPRESSION, MAJOR, RECURRENT 07/06/2006   ARTHRALGIA, UNSPECIFIED 07/06/2006    PCP: Ozell Heron HERO, MD   REFERRING PROVIDER: Harden Jerona GAILS, MD   REFERRING DIAG:  Diagnosis  [phone] (ICD-10-CM) - Degeneration of intervertebral disc of lumbar region with discogenic back pain and lower extremity pain    Rationale for Evaluation and Treatment: Rehabilitation  THERAPY DIAG:  Abnormal posture  Other muscle spasm  Muscle weakness (generalized)  Other low back pain  Radiculopathy, lumbar region  ONSET DATE: 7/25  SUBJECTIVE:                                                                                                                                                                                            SUBJECTIVE STATEMENT: Pt reports reduced back spasms but continued low grade pain/stiffness.  PERTINENT HISTORY:  No spinal surgeries  PAIN:  Are you having pain? Yes: NPRS scale: July 8/10 today 3/10 Pain location: L LE posterior/lateral and L LBP Pain description: tingling, numbness, sharp Aggravating factors: walking, yardwork, prolonged standing/sitting Relieving factors: medication, heat  PRECAUTIONS: None  RED FLAGS: None   WEIGHT BEARING RESTRICTIONS: No  FALLS:  Has patient fallen in last 6 months? No  LIVING ENVIRONMENT: Lives with: lives with their family and lives alone Lives in: House/apartment   OCCUPATION: Financial trader for [employer]  PLOF: Independent  PATIENT GOALS: walking without pain  NEXT MD VISIT: unknown  OBJECTIVE:  Note: Objective measures were completed at Evaluation unless otherwise noted.  DIAGNOSTIC FINDINGS:  12/12/23  2 view radiographs of the lumbar spine shows a degenerative scoliosis with  complete collapse of the disc space of the lumbar spine with periarticular  bony spurs.   PATIENT SURVEYS:  PSFS: THE PATIENT SPECIFIC FUNCTIONAL SCALE  Place score of 0-10 (0 = unable to perform activity and 10 = able to perform activity at the same level as before injury or problem)  Activity Date: 01/26/24    Walking for 20 min 5    2.standing for 30 min 4    3.yardwork for 30 min 5    4.      Total Score 4.66      Total Score = Sum of activity scores/number of activities  Minimally Detectable Change: 3 points (for single activity); 2 points (for average score)  [facility] (nd). The Patient Specific Functional Scale . Retrieved from [email or web address].com.pt   COGNITION: Overall cognitive status: Within functional limits for tasks  assessed     SENSATION: WFL  MUSCLE LENGTH: Hamstrings: Right/Left mod restriction deg  POSTURE: scap winging L , L shoulder higher, flattened T/L spine,dec axilla space R, L genu valgum   PALPATION: 1+ tenderness at L GM, TFL and pvm  LUMBAR ROM:   AROM eval  Flexion 75%  Extension WNL  Right lateral flexion 50%  Left lateral flexion 50%  Right rotation 75%  Left rotation WNL   (Blank rows = not tested)  LOWER EXTREMITY ROM:   WFL throughout  A/P ROM Right eval Left eval  Hip flexion    Hip extension    Hip abduction    Hip adduction    Hip internal rotation    Hip external rotation    Knee flexion    Knee extension    Ankle dorsiflexion    Ankle plantarflexion    Ankle inversion    Ankle eversion     (Blank rows = not tested)  LOWER EXTREMITY MMT:    MMT Right eval Left eval  Hip flexion  4  Hip extension    Hip abduction  4  Hip adduction    Hip internal rotation    Hip external rotation    Knee flexion    Knee extension    Ankle dorsiflexion 5- 5-  Ankle plantarflexion    Ankle inversion    Ankle eversion     (Blank rows = not tested)  LUMBAR SPECIAL TESTS:  SLR L negative  FUNCTIONAL TESTS:  5 times sit to stand: 13 sec no UE use  GAIT: Distance walked: 20 min community Assistive device utilized: None Level of assistance: Complete Independence   TREATMENT DATE: Lumbar 02/13/24 There Ex for ROM/strengthening  Nustep level 4 8 min Leg press 75# 3x10 SKTC  25 sec 3x each side Lumbar rotations 2x20 Neuro Re ed for posture/stabilization PPT 2x10 Bridges 2x10 with T band G Tband green hip abd 3x10 Manual Percussive device to lumbar pvm       02/06/24  There Ex for ROM/strengthening  Rec bike 6 min level 2 Leg press 75# 3x10 SKTC  25 sec 3x each side Lumbar rotations 2x20 Neuro Re ed for posture/stabilization PPT 2x10 Bridges 2x10 Tband green hip abd 3x10 Manual Percussive device to lumbar pvm  01/26/24 Initial  evaluation completed followed by trial set of HEP.                                                                                                                                  PATIENT EDUCATION:  Education details: HEP Person educated: Patient Education method: Explanation, Demonstration, Actor cues, and Verbal cues Education comprehension: verbalized understanding, returned demonstration, and verbal cues required  HOME EXERCISE PROGRAM: Access Code: [email or web address] URL: [email or web address] Date: 01/26/2024 Prepared by: Burnard Meth  Exercises - Supine Posterior Pelvic Tilt  - 2 x daily - 7 x weekly - 2 sets - 10 reps - 3 hold - Supine Bridge  - 2 x daily - 7 x weekly - 2 sets -  10 reps - 1 hold - Lower Trunk Rotations  - 2 x daily - 7 x weekly - 2 sets - 10 reps - 2 hold - Supine Double Knee to Chest  - 2 x daily - 7 x weekly - 1 sets - 4 reps - 25 hold  ASSESSMENT:  CLINICAL IMPRESSION: Improved form with stabilization exercises and less VC needed. OBJECTIVE IMPAIRMENTS: decreased balance, decreased endurance, decreased mobility, difficulty walking, decreased ROM, decreased strength, impaired flexibility, and pain.   ACTIVITY LIMITATIONS: bending, sitting, standing, squatting, transfers, and caring for others  PARTICIPATION LIMITATIONS: meal prep, community activity, and yard work  PERSONAL FACTORS: Age are also affecting patient's functional outcome.   REHAB POTENTIAL: Good  CLINICAL DECISION MAKING: Stable/uncomplicated  EVALUATION COMPLEXITY: Moderate   GOALS: Goals reviewed with patient? Yes  SHORT TERM GOALS: Target date: 02/23/2024    Pt to be independent with HEP. Baseline: Goal status: INITIAL  2.  Decrease level of pain by 1 grade. Baseline:  Goal status: INITIAL  LONG TERM GOALS: Target date: 03/22/2024    Pt to be independent with self progressive HEP by discharge. Baseline:  Goal status: INITIAL  2.  Decrease pain to 2/10 max  with all activities. Baseline:  Goal status: INITIAL  3.  Increase AROM by 25%. Baseline:  Goal status: INITIAL  4.  Increase strength by 1/2 to 1 full grade where affected. Baseline:  Goal status: INITIAL  5.  Improve PSFS by at least 2 points for measurable difference. Baseline:  Goal status: INITIAL  6.  Able to stand, ambulate or do yardwork for at least 45 minutes with minimal to no pain. Baseline: 20 min Goal status: INITIAL  PLAN:  PT FREQUENCY: 2x/week  PT DURATION: 8 weeks  PLANNED INTERVENTIONS: 97164- PT Re-evaluation, 97110-Therapeutic exercises, 97530- Therapeutic activity, 97112- Neuromuscular re-education, 97535- Self Care, [id number]- Manual therapy, [phone]- Gait training, [phone]- Aquatic Therapy, [phone]- Electrical stimulation (unattended), Patient/Family education, Balance training, Joint mobilization, Spinal mobilization, Cryotherapy, and Moist heat.  PLAN FOR NEXT SESSION: Increase stabilization exercises as tolerated.   Burnard CHRISTELLA Meth, PT 02/13/2024, 1:07 PM

## 2024-02-13 ENCOUNTER — Ambulatory Visit

## 2024-02-13 DIAGNOSIS — M62838 Other muscle spasm: Secondary | ICD-10-CM

## 2024-02-13 DIAGNOSIS — M5459 Other low back pain: Secondary | ICD-10-CM

## 2024-02-13 DIAGNOSIS — M5416 Radiculopathy, lumbar region: Secondary | ICD-10-CM

## 2024-02-13 DIAGNOSIS — R293 Abnormal posture: Secondary | ICD-10-CM | POA: Diagnosis not present

## 2024-02-13 DIAGNOSIS — M6281 Muscle weakness (generalized): Secondary | ICD-10-CM | POA: Diagnosis not present

## 2024-02-14 NOTE — Therapy (Incomplete)
 OUTPATIENT PHYSICAL THERAPY THORACOLUMBAR TREATMENT   Patient Name: Valerie Love MRN: [id number] DOB:1956-03-05, 68 y.o., female Today's Date: 02/14/2024  END OF SESSION:      Past Medical History:  Diagnosis Date   Anxiety    Arthritis    R knee, back, hands    Cellulitis of knee 04/2016   RT KNEE   Cellulitis of right knee 04/18/2016   Childhood asthma    Complication of anesthesia    woke up slowly - 25yrs. ago   Depression    related to husband illness and death   History of jaundice    as a child     Hx of varicella    Hyperthyroidism    during pregnancy, treated & resolved post partum    IBS (irritable bowel syndrome)    LBBB (left bundle branch block)    Scoliosis    Scoliosis 07/2015   Thyroid  disease in pregnancy    TOBACCO DEPENDENCE 07/06/2006   Qualifier: Diagnosis of  By: Manford Longs     Troponin level elevated    Past Surgical History:  Procedure Laterality Date   ATRIAL FIBRILLATION ABLATION N/A 08/11/2022   Procedure: ATRIAL FIBRILLATION ABLATION;  Surgeon: Inocencio Soyla Lunger, MD;  Location: [facility];  Service: Cardiovascular;  Laterality: N/A;   BREAST BIOPSY Right 1979   benign    Cyst removed from ovary     KNEE SURGERY     right duda    LEFT HEART CATH AND CORONARY ANGIOGRAPHY N/A 10/31/2018   Procedure: LEFT HEART CATH AND CORONARY ANGIOGRAPHY;  Surgeon: Burnard Debby LABOR, MD;  Location: [facility];  Service: Cardiovascular;  Laterality: N/A;   TONSILLECTOMY AND ADENOIDECTOMY  1073   TOTAL KNEE ARTHROPLASTY 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   Procedure: TOTAL KNEE ARTHROPLASTY;  Surgeon: Jerona LULLA Sage, MD;  Location: [facility];  Service: Orthopedics;  Laterality: Right;   TUBAL LIGATION     WISDOM TOOTH EXTRACTION     Patient Active Problem List   Diagnosis Date Noted   Paroxysmal atrial fibrillation (HCC) 06/05/2023   Asthma in adult 10/25/2022   Hypercoagulable state due to paroxysmal atrial fibrillation (HCC) 11/16/2021   Atrial  fibrillation with RVR (HCC) 11/10/2021   Atrial fibrillation with rapid ventricular response (HCC) 11/09/2021   Acute systolic heart failure (HCC) 11/01/2018   NSTEMI (non-ST elevated myocardial infarction) (HCC) 10/30/2018   Hyperlipidemia 06/13/2018   Lactose intolerance 06/13/2018   S/P total knee arthroplasty, right 03/16/2016   Unilateral primary osteoarthritis, right knee    Special screening for malignant neoplasms, colon 12/29/2014   Diarrhea 12/29/2014   Medication management 05/13/2013   Does not have health insurance 05/13/2013   Adjustment reaction with anxiety and depression 03/12/2012   Medication monitoring encounter 03/12/2012   Hemorrhoids 08/13/2011   Perineal itching, female 08/13/2011   LBBB (left bundle branch block) 09/24/2010   Murmur 09/08/2010   EKG, abnormal 08/19/2010   Visit for preventive health examination 08/19/2010   Palpitations 08/18/2010   Dizziness 08/18/2010   Arthritis 08/18/2010   Anxiety state 06/18/2008   GRIEF REACTION 06/18/2008   DEPRESSION, MAJOR, RECURRENT 07/06/2006   ARTHRALGIA, UNSPECIFIED 07/06/2006    PCP: Ozell Heron HERO, MD   REFERRING PROVIDER: Ozell Heron HERO, MD   REFERRING DIAG:  Diagnosis  [phone] (ICD-10-CM) - Degeneration of intervertebral disc of lumbar region with discogenic back pain and lower extremity pain    Rationale for Evaluation and Treatment: Rehabilitation  THERAPY DIAG:  No diagnosis  found.  ONSET DATE: 7/25  SUBJECTIVE:                                                                                                                                                                                           SUBJECTIVE STATEMENT: ***Pt reports reduced back spasms but continued low grade pain/stiffness.  PERTINENT HISTORY:  No spinal surgeries  PAIN:  ***Are you having pain? Yes: NPRS scale: July 8/10 today 3/10 Pain location: L LE posterior/lateral and L LBP Pain description: tingling,  numbness, sharp Aggravating factors: walking, yardwork, prolonged standing/sitting Relieving factors: medication, heat  PRECAUTIONS: None  RED FLAGS: None   WEIGHT BEARING RESTRICTIONS: No  FALLS:  Has patient fallen in last 6 months? No  LIVING ENVIRONMENT: Lives with: lives with their family and lives alone Lives in: House/apartment   OCCUPATION: Financial trader for [employer]  PLOF: Independent  PATIENT GOALS: walking without pain  NEXT MD VISIT: unknown  OBJECTIVE:  Note: Objective measures were completed at Evaluation unless otherwise noted.  DIAGNOSTIC FINDINGS:  12/12/23  2 view radiographs of the lumbar spine shows a degenerative scoliosis with  complete collapse of the disc space of the lumbar spine with periarticular  bony spurs.   PATIENT SURVEYS:  PSFS: THE PATIENT SPECIFIC FUNCTIONAL SCALE  Place score of 0-10 (0 = unable to perform activity and 10 = able to perform activity at the same level as before injury or problem)  Activity Date: 01/26/24    Walking for 20 min 5    2.standing for 30 min 4    3.yardwork for 30 min 5    4.      Total Score 4.66      Total Score = Sum of activity scores/number of activities  Minimally Detectable Change: 3 points (for single activity); 2 points (for average score)  [facility] (nd). The Patient Specific Functional Scale . Retrieved from [email or web address]   COGNITION: Overall cognitive status: Within functional limits for tasks assessed     SENSATION: WFL  MUSCLE LENGTH: Hamstrings: Right/Left mod restriction deg   POSTURE: scap winging L , L shoulder higher, flattened T/L spine,dec axilla space R, L genu valgum   PALPATION: 1+ tenderness at L GM, TFL and pvm  LUMBAR ROM:   AROM eval  Flexion 75%  Extension WNL  Right lateral flexion 50%  Left lateral flexion 50%  Right rotation 75%  Left rotation WNL   (Blank rows = not  tested)  LOWER EXTREMITY ROM:   WFL throughout  A/P ROM Right eval Left eval  Hip flexion    Hip extension    Hip abduction  Hip adduction    Hip internal rotation    Hip external rotation    Knee flexion    Knee extension    Ankle dorsiflexion    Ankle plantarflexion    Ankle inversion    Ankle eversion     (Blank rows = not tested)  LOWER EXTREMITY MMT:    MMT Right eval Left eval  Hip flexion  4  Hip extension    Hip abduction  4  Hip adduction    Hip internal rotation    Hip external rotation    Knee flexion    Knee extension    Ankle dorsiflexion 5- 5-  Ankle plantarflexion    Ankle inversion    Ankle eversion     (Blank rows = not tested)  LUMBAR SPECIAL TESTS:  SLR L negative  FUNCTIONAL TESTS:  5 times sit to stand: 13 sec no UE use  GAIT: Distance walked: 20 min community Assistive device utilized: None Level of assistance: Complete Independence   TREATMENT DATE: Lumbar 02/15/24***     02/13/24 There Ex for ROM/strengthening  Nustep level 4 8 min Leg press 75# 3x10 SKTC  25 sec 3x each side Lumbar rotations 2x20 Neuro Re ed for posture/stabilization PPT 2x10 Bridges 2x10 with T band G Tband green hip abd 3x10 Manual Percussive device to lumbar pvm       02/06/24  There Ex for ROM/strengthening  Rec bike 6 min level 2 Leg press 75# 3x10 SKTC  25 sec 3x each side Lumbar rotations 2x20 Neuro Re ed for posture/stabilization PPT 2x10 Bridges 2x10 Tband green hip abd 3x10 Manual Percussive device to lumbar pvm  01/26/24 Initial evaluation completed followed by trial set of HEP.                                                                                                                                  PATIENT EDUCATION:  Education details: HEP Person educated: Patient Education method: Explanation, Demonstration, Actor cues, and Verbal cues Education comprehension: verbalized understanding, returned demonstration,  and verbal cues required  HOME EXERCISE PROGRAM: Access Code: [email or web address] URL: [email or web address] Date: 01/26/2024 Prepared by: Burnard Meth  Exercises - Supine Posterior Pelvic Tilt  - 2 x daily - 7 x weekly - 2 sets - 10 reps - 3 hold - Supine Bridge  - 2 x daily - 7 x weekly - 2 sets - 10 reps - 1 hold - Lower Trunk Rotations  - 2 x daily - 7 x weekly - 2 sets - 10 reps - 2 hold - Supine Double Knee to Chest  - 2 x daily - 7 x weekly - 1 sets - 4 reps - 25 hold  ASSESSMENT:  CLINICAL IMPRESSION:*** Improved form with stabilization exercises and less VC needed. OBJECTIVE IMPAIRMENTS: decreased balance, decreased endurance, decreased mobility, difficulty walking, decreased ROM, decreased strength, impaired flexibility, and pain.   ACTIVITY LIMITATIONS: bending, sitting, standing, squatting,  transfers, and caring for others  PARTICIPATION LIMITATIONS: meal prep, community activity, and yard work  PERSONAL FACTORS: Age are also affecting patient's functional outcome.   REHAB POTENTIAL: Good  CLINICAL DECISION MAKING: Stable/uncomplicated  EVALUATION COMPLEXITY: Moderate   GOALS: Goals reviewed with patient? Yes  SHORT TERM GOALS: Target date: 02/23/2024    Pt to be independent with HEP. Baseline: Goal status: INITIAL  2.  Decrease level of pain by 1 grade. Baseline:  Goal status: INITIAL  LONG TERM GOALS: Target date: 03/22/2024    Pt to be independent with self progressive HEP by discharge. Baseline:  Goal status: INITIAL  2.  Decrease pain to 2/10 max with all activities. Baseline:  Goal status: INITIAL  3.  Increase AROM by 25%. Baseline:  Goal status: INITIAL  4.  Increase strength by 1/2 to 1 full grade where affected. Baseline:  Goal status: INITIAL  5.  Improve PSFS by at least 2 points for measurable difference. Baseline:  Goal status: INITIAL  6.  Able to stand, ambulate or do yardwork for at least 45 minutes with minimal to no  pain. Baseline: 20 min Goal status: INITIAL  PLAN:  PT FREQUENCY: 2x/week  PT DURATION: 8 weeks  PLANNED INTERVENTIONS: 97164- PT Re-evaluation, 97110-Therapeutic exercises, 97530- Therapeutic activity, 97112- Neuromuscular re-education, 97535- Self Care, 02859- Manual therapy, [phone]- Gait training, [phone]- Aquatic Therapy, [phone]- Electrical stimulation (unattended), Patient/Family education, Balance training, Joint mobilization, Spinal mobilization, Cryotherapy, and Moist heat.  PLAN FOR NEXT SESSION: ***Increase stabilization exercises as tolerated.   Burnard CHRISTELLA Meth, PT 02/14/2024, 10:35 AM

## 2024-02-15 ENCOUNTER — Telehealth: Payer: Self-pay

## 2024-02-15 ENCOUNTER — Encounter

## 2024-02-15 NOTE — Telephone Encounter (Signed)
 Called pts cell.  LMOVM about missed 02/15/24 appointment and told her I could accommodate her at 4:00 today if that would work for her.  Also reviewed when next appointment is.

## 2024-02-18 NOTE — Therapy (Signed)
 OUTPATIENT PHYSICAL THERAPY THORACOLUMBAR TREATMENT   Patient Name: Valerie Love MRN: [id number] DOB:Sep 20, 1955, 68 y.o., female Today's Date: 02/20/24  END OF SESSION:  PT End of Session - 02/20/24 1548     Visit Number 4    Number of Visits 17    Date for Recertification  03/22/24    PT Start Time 1145    PT Stop Time 1225    PT Time Calculation (min) 40 min    Activity Tolerance Patient tolerated treatment well    Behavior During Therapy [facility] for tasks assessed/performed             Past Medical History:  Diagnosis Date   Anxiety    Arthritis    R knee, back, hands    Cellulitis of knee 04/2016   RT KNEE   Cellulitis of right knee 04/18/2016   Childhood asthma    Complication of anesthesia    woke up slowly - 17yrs. ago   Depression    related to husband illness and death   History of jaundice    as a child     Hx of varicella    Hyperthyroidism    during pregnancy, treated & resolved post partum    IBS (irritable bowel syndrome)    LBBB (left bundle branch block)    Scoliosis    Scoliosis 07/2015   Thyroid  disease in pregnancy    TOBACCO DEPENDENCE 07/06/2006   Qualifier: Diagnosis of  By: Manford Longs     Troponin level elevated    Past Surgical History:  Procedure Laterality Date   ATRIAL FIBRILLATION ABLATION N/A 08/11/2022   Procedure: ATRIAL FIBRILLATION ABLATION;  Surgeon: Inocencio Soyla Lunger, MD;  Location: [facility];  Service: Cardiovascular;  Laterality: N/A;   BREAST BIOPSY Right 1979   benign    Cyst removed from ovary     KNEE SURGERY     right duda    LEFT HEART CATH AND CORONARY ANGIOGRAPHY N/A 10/31/2018   Procedure: LEFT HEART CATH AND CORONARY ANGIOGRAPHY;  Surgeon: Burnard Debby LABOR, MD;  Location: [facility];  Service: Cardiovascular;  Laterality: N/A;   TONSILLECTOMY AND ADENOIDECTOMY  1073   TOTAL KNEE ARTHROPLASTY 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   Procedure: TOTAL KNEE ARTHROPLASTY;  Surgeon: Jerona LULLA Sage, MD;  Location: [facility];  Service: Orthopedics;  Laterality: Right;   TUBAL LIGATION     WISDOM TOOTH EXTRACTION     Patient Active Problem List   Diagnosis Date Noted   Paroxysmal atrial fibrillation (HCC) 06/05/2023   Asthma in adult 10/25/2022   Hypercoagulable state due to paroxysmal atrial fibrillation (HCC) 11/16/2021   Atrial fibrillation with RVR (HCC) 11/10/2021   Atrial fibrillation with rapid ventricular response (HCC) 11/09/2021   Acute systolic heart failure (HCC) 11/01/2018   NSTEMI (non-ST elevated myocardial infarction) (HCC) 10/30/2018   Hyperlipidemia 06/13/2018   Lactose intolerance 06/13/2018   S/P total knee arthroplasty, right 03/16/2016   Unilateral primary osteoarthritis, right knee    Special screening for malignant neoplasms, colon 12/29/2014   Diarrhea 12/29/2014   Medication management 05/13/2013   Does not have health insurance 05/13/2013   Adjustment reaction with anxiety and depression 03/12/2012   Medication monitoring encounter 03/12/2012   Hemorrhoids 08/13/2011   Perineal itching, female 08/13/2011   LBBB (left bundle branch block) 09/24/2010   Murmur 09/08/2010   EKG, abnormal 08/19/2010   Visit for preventive health examination 08/19/2010   Palpitations 08/18/2010   Dizziness 08/18/2010  Arthritis 08/18/2010   Anxiety state 06/18/2008   GRIEF REACTION 06/18/2008   DEPRESSION, MAJOR, RECURRENT 07/06/2006   ARTHRALGIA, UNSPECIFIED 07/06/2006    PCP: Ozell Heron HERO, MD   REFERRING PROVIDER: Harden Jerona GAILS, MD   REFERRING DIAG:  Diagnosis  [id number] (ICD-10-CM) - Degeneration of intervertebral disc of lumbar region with discogenic back pain and lower extremity pain    Rationale for Evaluation and Treatment: Rehabilitation  THERAPY DIAG:  Abnormal posture  Other muscle spasm  Muscle weakness (generalized)  Radiculopathy, lumbar region  Other low back pain  ONSET DATE: 7/25  SUBJECTIVE:                                                                                                                                                                                            SUBJECTIVE STATEMENT: Pt reports some increase in back stiffness after 30 minutes of walking.    PERTINENT HISTORY:  No spinal surgeries  PAIN:  Are you having pain? Yes: NPRS scale: July 8/10 today 6/10 Pain location: L LE posterior/lateral and L LBP Pain description: tingling, numbness, sharp Aggravating factors: walking, yardwork, prolonged standing/sitting Relieving factors: medication, heat  PRECAUTIONS: None  RED FLAGS: None   WEIGHT BEARING RESTRICTIONS: No  FALLS:  Has patient fallen in last 6 months? No  LIVING ENVIRONMENT: Lives with: lives with their family and lives alone Lives in: House/apartment   OCCUPATION: Financial trader for [employer]  PLOF: Independent  PATIENT GOALS: walking without pain  NEXT MD VISIT: unknown  OBJECTIVE:  Note: Objective measures were completed at Evaluation unless otherwise noted.  DIAGNOSTIC FINDINGS:  12/12/23  2 view radiographs of the lumbar spine shows a degenerative scoliosis with  complete collapse of the disc space of the lumbar spine with periarticular  bony spurs.   PATIENT SURVEYS:  PSFS: THE PATIENT SPECIFIC FUNCTIONAL SCALE  Place score of 0-10 (0 = unable to perform activity and 10 = able to perform activity at the same level as before injury or problem)  Activity Date: 01/26/24    Walking for 20 min 5    2.standing for 30 min 4    3.yardwork for 30 min 5    4.      Total Score 4.66      Total Score = Sum of activity scores/number of activities  Minimally Detectable Change: 3 points (for single activity); 2 points (for average score)  [facility] (nd). The Patient Specific Functional Scale . Retrieved from [email or web address]   COGNITION: Overall cognitive status: Within functional limits for tasks  assessed     SENSATION: WFL  MUSCLE LENGTH: Hamstrings: Right/Left  mod restriction deg   POSTURE: scap winging L , L shoulder higher, flattened T/L spine,dec axilla space R, L genu valgum   PALPATION: 1+ tenderness at L GM, TFL and pvm  LUMBAR ROM:   AROM eval  Flexion 75%  Extension WNL  Right lateral flexion 50%  Left lateral flexion 50%  Right rotation 75%  Left rotation WNL   (Blank rows = not tested)  LOWER EXTREMITY ROM:   WFL throughout  A/P ROM Right eval Left eval  Hip flexion    Hip extension    Hip abduction    Hip adduction    Hip internal rotation    Hip external rotation    Knee flexion    Knee extension    Ankle dorsiflexion    Ankle plantarflexion    Ankle inversion    Ankle eversion     (Blank rows = not tested)  LOWER EXTREMITY MMT:    MMT Right eval Left eval  Hip flexion  4  Hip extension    Hip abduction  4  Hip adduction    Hip internal rotation    Hip external rotation    Knee flexion    Knee extension    Ankle dorsiflexion 5- 5-  Ankle plantarflexion    Ankle inversion    Ankle eversion     (Blank rows = not tested)  LUMBAR SPECIAL TESTS:  SLR L negative  FUNCTIONAL TESTS:  5 times sit to stand: 13 sec no UE use  GAIT: Distance walked: 20 min community Assistive device utilized: None Level of assistance: Complete Independence   TREATMENT DATE: Lumbar 02/20/24 There Ex for ROM/strengthening  Nustep level 5 8 min Leg press 75# 3x10 SKTC  25 sec 3x each side Lumbar rotations 2x20 Neuro Re ed for posture/stabilization PPT 2x10 Bridges 3x10 with T band G Tband green hip abd 3x10 Manual Percussive device to lumbar pvm    02/13/24 There Ex for ROM/strengthening  Nustep level 4 8 min Leg press 75# 3x10 SKTC  25 sec 3x each side Lumbar rotations 2x20 Neuro Re ed for posture/stabilization PPT 2x10 Bridges 2x10 with T band G Tband green hip abd 3x10 Manual Percussive device to lumbar  pvm       02/06/24  There Ex for ROM/strengthening  Rec bike 6 min level 2 Leg press 75# 3x10 SKTC  25 sec 3x each side Lumbar rotations 2x20 Neuro Re ed for posture/stabilization PPT 2x10 Bridges 2x10 Tband green hip abd 3x10 Manual Percussive device to lumbar pvm  01/26/24 Initial evaluation completed followed by trial set of HEP.                                                                                                                                  PATIENT EDUCATION:  Education details: HEP Person educated: Patient Education method: Explanation, Demonstration, Actor cues, and Verbal cues Education comprehension: verbalized understanding, returned demonstration, and verbal  cues required  HOME EXERCISE PROGRAM: Access Code: [email or web address] URL: [email or web address] Date: 01/26/2024 Prepared by: Burnard Meth  Exercises - Supine Posterior Pelvic Tilt  - 2 x daily - 7 x weekly - 2 sets - 10 reps - 3 hold - Supine Bridge  - 2 x daily - 7 x weekly - 2 sets - 10 reps - 1 hold - Lower Trunk Rotations  - 2 x daily - 7 x weekly - 2 sets - 10 reps - 2 hold - Supine Double Knee to Chest  - 2 x daily - 7 x weekly - 1 sets - 4 reps - 25 hold  ASSESSMENT:  CLINICAL IMPRESSION:Increased tenderness at bilateral lumbar pvm.  Pt noted increased ease of motion after percussive device.  OBJECTIVE IMPAIRMENTS: decreased balance, decreased endurance, decreased mobility, difficulty walking, decreased ROM, decreased strength, impaired flexibility, and pain.   ACTIVITY LIMITATIONS: bending, sitting, standing, squatting, transfers, and caring for others  PARTICIPATION LIMITATIONS: meal prep, community activity, and yard work  PERSONAL FACTORS: Age are also affecting patient's functional outcome.   REHAB POTENTIAL: Good  CLINICAL DECISION MAKING: Stable/uncomplicated  EVALUATION COMPLEXITY: Moderate   GOALS: Goals reviewed with patient? Yes  SHORT TERM GOALS:  Target date: 02/23/2024    Pt to be independent with HEP. Baseline: Goal status:MET 10/14  2.  Decrease level of pain by 1 grade. Baseline:  Goal status: MET 10/14  LONG TERM GOALS: Target date: 03/22/2024    Pt to be independent with self progressive HEP by discharge. Baseline:  Goal status: INITIAL  2.  Decrease pain to 2/10 max with all activities. Baseline:  Goal status: INITIAL  3.  Increase AROM by 25%. Baseline:  Goal status: INITIAL  4.  Increase strength by 1/2 to 1 full grade where affected. Baseline:  Goal status: INITIAL  5.  Improve PSFS by at least 2 points for measurable difference. Baseline:  Goal status: INITIAL  6.  Able to stand, ambulate or do yardwork for at least 45 minutes with minimal to no pain. Baseline: 20 min Goal status: INITIAL  PLAN:  PT FREQUENCY: 2x/week  PT DURATION: 8 weeks  PLANNED INTERVENTIONS: 97164- PT Re-evaluation, 97110-Therapeutic exercises, 97530- Therapeutic activity, 97112- Neuromuscular re-education, 97535- Self Care, 02859- Manual therapy, [phone]- Gait training, [phone]- Aquatic Therapy, [phone]- Electrical stimulation (unattended), Patient/Family education, Balance training, Joint mobilization, Spinal mobilization, Cryotherapy, and Moist heat.  PLAN FOR NEXT SESSION: Increase stabilization exercises as tolerated.   Burnard CHRISTELLA Meth, PT 02/20/2024, 3:50 PM

## 2024-02-20 ENCOUNTER — Ambulatory Visit

## 2024-02-20 DIAGNOSIS — M62838 Other muscle spasm: Secondary | ICD-10-CM

## 2024-02-20 DIAGNOSIS — M5416 Radiculopathy, lumbar region: Secondary | ICD-10-CM | POA: Diagnosis not present

## 2024-02-20 DIAGNOSIS — M6281 Muscle weakness (generalized): Secondary | ICD-10-CM

## 2024-02-20 DIAGNOSIS — R293 Abnormal posture: Secondary | ICD-10-CM

## 2024-02-20 DIAGNOSIS — M5459 Other low back pain: Secondary | ICD-10-CM

## 2024-02-22 NOTE — Therapy (Signed)
 OUTPATIENT PHYSICAL THERAPY THORACOLUMBAR TREATMENT   Patient Name: Valerie Love MRN: [id number] DOB:01-Jun-1955, 68 y.o., female Today's Date: 02/20/24  END OF SESSION:  PT End of Session - 02/23/24 1103     Visit Number 5    Number of Visits 17    Date for Recertification  03/22/24    PT Start Time 1015    PT Stop Time 1053    PT Time Calculation (min) 38 min    Activity Tolerance Patient tolerated treatment well    Behavior During Therapy [facility] for tasks assessed/performed              Past Medical History:  Diagnosis Date   Anxiety    Arthritis    R knee, back, hands    Cellulitis of knee 04/2016   RT KNEE   Cellulitis of right knee 04/18/2016   Childhood asthma    Complication of anesthesia    woke up slowly - 29yrs. ago   Depression    related to husband illness and death   History of jaundice    as a child     Hx of varicella    Hyperthyroidism    during pregnancy, treated & resolved post partum    IBS (irritable bowel syndrome)    LBBB (left bundle branch block)    Scoliosis    Scoliosis 07/2015   Thyroid  disease in pregnancy    TOBACCO DEPENDENCE 07/06/2006   Qualifier: Diagnosis of  By: Manford Longs     Troponin level elevated    Past Surgical History:  Procedure Laterality Date   ATRIAL FIBRILLATION ABLATION N/A 08/11/2022   Procedure: ATRIAL FIBRILLATION ABLATION;  Surgeon: Inocencio Soyla Lunger, MD;  Location: [facility];  Service: Cardiovascular;  Laterality: N/A;   BREAST BIOPSY Right 1979   benign    Cyst removed from ovary     KNEE SURGERY     right duda    LEFT HEART CATH AND CORONARY ANGIOGRAPHY N/A 10/31/2018   Procedure: LEFT HEART CATH AND CORONARY ANGIOGRAPHY;  Surgeon: Burnard Debby LABOR, MD;  Location: [facility];  Service: Cardiovascular;  Laterality: N/A;   TONSILLECTOMY AND ADENOIDECTOMY  1073   TOTAL KNEE ARTHROPLASTY 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   Procedure: TOTAL KNEE ARTHROPLASTY;  Surgeon: Jerona LULLA Sage, MD;  Location:  [facility] OR;  Service: Orthopedics;  Laterality: Right;   TUBAL LIGATION     WISDOM TOOTH EXTRACTION     Patient Active Problem List   Diagnosis Date Noted   Paroxysmal atrial fibrillation (HCC) 06/05/2023   Asthma in adult 10/25/2022   Hypercoagulable state due to paroxysmal atrial fibrillation (HCC) 11/16/2021   Atrial fibrillation with RVR (HCC) 11/10/2021   Atrial fibrillation with rapid ventricular response (HCC) 11/09/2021   Acute systolic heart failure (HCC) 11/01/2018   NSTEMI (non-ST elevated myocardial infarction) (HCC) 10/30/2018   Hyperlipidemia 06/13/2018   Lactose intolerance 06/13/2018   S/P total knee arthroplasty, right 03/16/2016   Unilateral primary osteoarthritis, right knee    Special screening for malignant neoplasms, colon 12/29/2014   Diarrhea 12/29/2014   Medication management 05/13/2013   Does not have health insurance 05/13/2013   Adjustment reaction with anxiety and depression 03/12/2012   Medication monitoring encounter 03/12/2012   Hemorrhoids 08/13/2011   Perineal itching, female 08/13/2011   LBBB (left bundle branch block) 09/24/2010   Murmur 09/08/2010   EKG, abnormal 08/19/2010   Visit for preventive health examination 08/19/2010   Palpitations 08/18/2010   Dizziness 08/18/2010  Arthritis 08/18/2010   Anxiety state 06/18/2008   GRIEF REACTION 06/18/2008   DEPRESSION, MAJOR, RECURRENT 07/06/2006   ARTHRALGIA, UNSPECIFIED 07/06/2006    PCP: Ozell Heron HERO, MD   REFERRING PROVIDER: Harden Jerona GAILS, MD   REFERRING DIAG:  Diagnosis  [phone] (ICD-10-CM) - Degeneration of intervertebral disc of lumbar region with discogenic back pain and lower extremity pain    Rationale for Evaluation and Treatment: Rehabilitation  THERAPY DIAG:  Abnormal posture  Other muscle spasm  Muscle weakness (generalized)  Radiculopathy, lumbar region  Other low back pain  ONSET DATE: 7/25  SUBJECTIVE:                                                                                                                                                                                            SUBJECTIVE STATEMENT: Pt reports back feeling better than last visit.  Able to do more around the house.  PERTINENT HISTORY:  No spinal surgeries  PAIN:  Are you having pain? Yes: NPRS scale: July 8/10 today 5/10 Pain location: L LE posterior/lateral and L LBP Pain description: tingling, numbness, sharp Aggravating factors: walking, yardwork, prolonged standing/sitting Relieving factors: medication, heat  PRECAUTIONS: None  RED FLAGS: None   WEIGHT BEARING RESTRICTIONS: No  FALLS:  Has patient fallen in last 6 months? No  LIVING ENVIRONMENT: Lives with: lives with their family and lives alone Lives in: House/apartment   OCCUPATION: Financial trader for [employer]  PLOF: Independent  PATIENT GOALS: walking without pain  NEXT MD VISIT: unknown  OBJECTIVE:  Note: Objective measures were completed at Evaluation unless otherwise noted.  DIAGNOSTIC FINDINGS:  12/12/23  2 view radiographs of the lumbar spine shows a degenerative scoliosis with  complete collapse of the disc space of the lumbar spine with periarticular  bony spurs.   PATIENT SURVEYS:  PSFS: THE PATIENT SPECIFIC FUNCTIONAL SCALE  Place score of 0-10 (0 = unable to perform activity and 10 = able to perform activity at the same level as before injury or problem)  Activity Date: 01/26/24    Walking for 20 min 5    2.standing for 30 min 4    3.yardwork for 30 min 5    4.      Total Score 4.66      Total Score = Sum of activity scores/number of activities  Minimally Detectable Change: 3 points (for single activity); 2 points (for average score)  [facility] (nd). The Patient Specific Functional Scale . Retrieved from [email or web address]   COGNITION: Overall cognitive status: Within functional limits for tasks  assessed     SENSATION: WFL  MUSCLE LENGTH:  Hamstrings: Right/Left mod restriction deg   POSTURE: scap winging L , L shoulder higher, flattened T/L spine,dec axilla space R, L genu valgum   PALPATION: 1+ tenderness at L GM, TFL and pvm  LUMBAR ROM:   AROM eval  Flexion 75%  Extension WNL  Right lateral flexion 50%  Left lateral flexion 50%  Right rotation 75%  Left rotation WNL   (Blank rows = not tested)  LOWER EXTREMITY ROM:   WFL throughout  A/P ROM Right eval Left eval  Hip flexion    Hip extension    Hip abduction    Hip adduction    Hip internal rotation    Hip external rotation    Knee flexion    Knee extension    Ankle dorsiflexion    Ankle plantarflexion    Ankle inversion    Ankle eversion     (Blank rows = not tested)  LOWER EXTREMITY MMT:    MMT Right eval Left eval  Hip flexion  4  Hip extension    Hip abduction  4  Hip adduction    Hip internal rotation    Hip external rotation    Knee flexion    Knee extension    Ankle dorsiflexion 5- 5-  Ankle plantarflexion    Ankle inversion    Ankle eversion     (Blank rows = not tested)  LUMBAR SPECIAL TESTS:  SLR L negative  FUNCTIONAL TESTS:  5 times sit to stand: 13 sec no UE use  GAIT: Distance walked: 20 min community Assistive device utilized: None Level of assistance: Complete Independence   TREATMENT DATE: Lumbar 02/23/24 There Ex for ROM/strengthening  Nustep level 5 8 min SKTC  25 sec 3x each side Lumbar rotations 2x20 Neuro Re ed for posture/stabilization PPT 2x10 Bridges 3x10 with T band G Tband green hip abd 3x10 Manual Percussive device to lumbar pvm    02/20/24 There Ex for ROM/strengthening  Nustep level 5 8 min Leg press 75# 3x10 SKTC  25 sec 3x each side Lumbar rotations 2x20 Neuro Re ed for posture/stabilization PPT 2x10 Bridges 3x10 with T band G Tband green hip abd 3x10 Manual Percussive device to lumbar pvm    02/13/24 There Ex for  ROM/strengthening  Nustep level 4 8 min Leg press 75# 3x10 SKTC  25 sec 3x each side Lumbar rotations 2x20 Neuro Re ed for posture/stabilization PPT 2x10 Bridges 2x10 with T band G Tband green hip abd 3x10 Manual Percussive device to lumbar pvm       02/06/24  There Ex for ROM/strengthening  Rec bike 6 min level 2 Leg press 75# 3x10 SKTC  25 sec 3x each side Lumbar rotations 2x20 Neuro Re ed for posture/stabilization PPT 2x10 Bridges 2x10 Tband green hip abd 3x10 Manual Percussive device to lumbar pvm  01/26/24 Initial evaluation completed followed by trial set of HEP.  PATIENT EDUCATION:  Education details: HEP Person educated: Patient Education method: Explanation, Demonstration, Actor cues, and Verbal cues Education comprehension: verbalized understanding, returned demonstration, and verbal cues required  HOME EXERCISE PROGRAM: Access Code: [email or web address] URL: [email or web address] Date: 01/26/2024 Prepared by: Burnard Meth  Exercises - Supine Posterior Pelvic Tilt  - 2 x daily - 7 x weekly - 2 sets - 10 reps - 3 hold - Supine Bridge  - 2 x daily - 7 x weekly - 2 sets - 10 reps - 1 hold - Lower Trunk Rotations  - 2 x daily - 7 x weekly - 2 sets - 10 reps - 2 hold - Supine Double Knee to Chest  - 2 x daily - 7 x weekly - 1 sets - 4 reps - 25 hold  ASSESSMENT:  CLINICAL IMPRESSION:Needed VC for T band bridges.  Demonstrated understanding.   OBJECTIVE IMPAIRMENTS: decreased balance, decreased endurance, decreased mobility, difficulty walking, decreased ROM, decreased strength, impaired flexibility, and pain.   ACTIVITY LIMITATIONS: bending, sitting, standing, squatting, transfers, and caring for others  PARTICIPATION LIMITATIONS: meal prep, community activity, and yard work  PERSONAL FACTORS: Age are also affecting  patient's functional outcome.   REHAB POTENTIAL: Good  CLINICAL DECISION MAKING: Stable/uncomplicated  EVALUATION COMPLEXITY: Moderate   GOALS: Goals reviewed with patient? Yes  SHORT TERM GOALS: Target date: 02/23/2024    Pt to be independent with HEP. Baseline: Goal status:MET 10/14  2.  Decrease level of pain by 1 grade. Baseline:  Goal status: MET 10/14  LONG TERM GOALS: Target date: 03/22/2024    Pt to be independent with self progressive HEP by discharge. Baseline:  Goal status: INITIAL  2.  Decrease pain to 2/10 max with all activities. Baseline:  Goal status: INITIAL  3.  Increase AROM by 25%. Baseline:  Goal status: INITIAL  4.  Increase strength by 1/2 to 1 full grade where affected. Baseline:  Goal status: INITIAL  5.  Improve PSFS by at least 2 points for measurable difference. Baseline:  Goal status: INITIAL  6.  Able to stand, ambulate or do yardwork for at least 45 minutes with minimal to no pain. Baseline: 20 min Goal status: INITIAL  PLAN:  PT FREQUENCY: 2x/week  PT DURATION: 8 weeks  PLANNED INTERVENTIONS: 97164- PT Re-evaluation, 97110-Therapeutic exercises, 97530- Therapeutic activity, 97112- Neuromuscular re-education, 97535- Self Care, 02859- Manual therapy, [phone]- Gait training, [phone]- Aquatic Therapy, [phone]- Electrical stimulation (unattended), Patient/Family education, Balance training, Joint mobilization, Spinal mobilization, Cryotherapy, and Moist heat.  PLAN FOR NEXT SESSION: Review MRI findings and reassess.     Burnard CHRISTELLA Meth, PT 02/23/2024, 11:09 AM

## 2024-02-23 ENCOUNTER — Ambulatory Visit

## 2024-02-23 DIAGNOSIS — M5416 Radiculopathy, lumbar region: Secondary | ICD-10-CM | POA: Diagnosis not present

## 2024-02-23 DIAGNOSIS — R293 Abnormal posture: Secondary | ICD-10-CM | POA: Diagnosis not present

## 2024-02-23 DIAGNOSIS — M5459 Other low back pain: Secondary | ICD-10-CM | POA: Diagnosis not present

## 2024-02-23 DIAGNOSIS — M62838 Other muscle spasm: Secondary | ICD-10-CM | POA: Diagnosis not present

## 2024-02-23 DIAGNOSIS — M6281 Muscle weakness (generalized): Secondary | ICD-10-CM

## 2024-02-26 ENCOUNTER — Ambulatory Visit
Admission: RE | Admit: 2024-02-26 | Discharge: 2024-02-26 | Disposition: A | Discharge status: Home / Self Care | Source: Ambulatory Visit | Attending: Orthopedic Surgery | Admitting: Orthopedic Surgery

## 2024-02-26 DIAGNOSIS — M51362 Other intervertebral disc degeneration, lumbar region with discogenic back pain and lower extremity pain: Secondary | ICD-10-CM

## 2024-02-26 DIAGNOSIS — M5442 Lumbago with sciatica, left side: Secondary | ICD-10-CM

## 2024-02-26 DIAGNOSIS — M5127 Other intervertebral disc displacement, lumbosacral region: Secondary | ICD-10-CM | POA: Diagnosis not present

## 2024-02-26 DIAGNOSIS — M47817 Spondylosis without myelopathy or radiculopathy, lumbosacral region: Secondary | ICD-10-CM | POA: Diagnosis not present

## 2024-02-26 DIAGNOSIS — M48061 Spinal stenosis, lumbar region without neurogenic claudication: Secondary | ICD-10-CM | POA: Diagnosis not present

## 2024-02-26 NOTE — Therapy (Incomplete)
 OUTPATIENT PHYSICAL THERAPY THORACOLUMBAR TREATMENT   Patient Name: Valerie Love MRN: [id number] DOB:1955/08/23, 68 y.o., female Today's Date: 02/20/24  END OF SESSION:        Past Medical History:  Diagnosis Date   Anxiety    Arthritis    R knee, back, hands    Cellulitis of knee 04/2016   RT KNEE   Cellulitis of right knee 04/18/2016   Childhood asthma    Complication of anesthesia    woke up slowly - 9yrs. ago   Depression    related to husband illness and death   History of jaundice    as a child     Hx of varicella    Hyperthyroidism    during pregnancy, treated & resolved post partum    IBS (irritable bowel syndrome)    LBBB (left bundle branch block)    Scoliosis    Scoliosis 07/2015   Thyroid  disease in pregnancy    TOBACCO DEPENDENCE 07/06/2006   Qualifier: Diagnosis of  By: Manford Longs     Troponin level elevated    Past Surgical History:  Procedure Laterality Date   ATRIAL FIBRILLATION ABLATION N/A 08/11/2022   Procedure: ATRIAL FIBRILLATION ABLATION;  Surgeon: Inocencio Soyla Lunger, MD;  Location: [facility];  Service: Cardiovascular;  Laterality: N/A;   BREAST BIOPSY Right 1979   benign    Cyst removed from ovary     KNEE SURGERY     right duda    LEFT HEART CATH AND CORONARY ANGIOGRAPHY N/A 10/31/2018   Procedure: LEFT HEART CATH AND CORONARY ANGIOGRAPHY;  Surgeon: Burnard Debby LABOR, MD;  Location: [facility];  Service: Cardiovascular;  Laterality: N/A;   TONSILLECTOMY AND ADENOIDECTOMY  1073   TOTAL KNEE ARTHROPLASTY 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   Procedure: TOTAL KNEE ARTHROPLASTY;  Surgeon: Jerona LULLA Sage, MD;  Location: [facility] OR;  Service: Orthopedics;  Laterality: Right;   TUBAL LIGATION     WISDOM TOOTH EXTRACTION     Patient Active Problem List   Diagnosis Date Noted   Paroxysmal atrial fibrillation (HCC) 06/05/2023   Asthma in adult 10/25/2022   Hypercoagulable state due to paroxysmal atrial fibrillation (HCC) 11/16/2021   Atrial  fibrillation with RVR (HCC) 11/10/2021   Atrial fibrillation with rapid ventricular response (HCC) 11/09/2021   Acute systolic heart failure (HCC) 11/01/2018   NSTEMI (non-ST elevated myocardial infarction) (HCC) 10/30/2018   Hyperlipidemia 06/13/2018   Lactose intolerance 06/13/2018   S/P total knee arthroplasty, right 03/16/2016   Unilateral primary osteoarthritis, right knee    Special screening for malignant neoplasms, colon 12/29/2014   Diarrhea 12/29/2014   Medication management 05/13/2013   Does not have health insurance 05/13/2013   Adjustment reaction with anxiety and depression 03/12/2012   Medication monitoring encounter 03/12/2012   Hemorrhoids 08/13/2011   Perineal itching, female 08/13/2011   LBBB (left bundle branch block) 09/24/2010   Murmur 09/08/2010   EKG, abnormal 08/19/2010   Visit for preventive health examination 08/19/2010   Palpitations 08/18/2010   Dizziness 08/18/2010   Arthritis 08/18/2010   Anxiety state 06/18/2008   GRIEF REACTION 06/18/2008   DEPRESSION, MAJOR, RECURRENT 07/06/2006   ARTHRALGIA, UNSPECIFIED 07/06/2006    PCP: Ozell Heron HERO, MD   REFERRING PROVIDER: Ozell Heron HERO, MD   REFERRING DIAG:  Diagnosis  [phone] (ICD-10-CM) - Degeneration of intervertebral disc of lumbar region with discogenic back pain and lower extremity pain    Rationale for Evaluation and Treatment: Rehabilitation  THERAPY DIAG:  No diagnosis found.  ONSET DATE: 7/25  SUBJECTIVE:                                                                                                                                                                                           SUBJECTIVE STATEMENT: ***Pt reports back feeling better than last visit.  Able to do more around the house.  PERTINENT HISTORY:  No spinal surgeries  PAIN:  ***Are you having pain? Yes: NPRS scale: July 8/10 today 5/10 Pain location: L LE posterior/lateral and L LBP Pain  description: tingling, numbness, sharp Aggravating factors: walking, yardwork, prolonged standing/sitting Relieving factors: medication, heat  PRECAUTIONS: None  RED FLAGS: None   WEIGHT BEARING RESTRICTIONS: No  FALLS:  Has patient fallen in last 6 months? No  LIVING ENVIRONMENT: Lives with: lives with their family and lives alone Lives in: House/apartment   OCCUPATION: Financial trader for [employer]  PLOF: Independent  PATIENT GOALS: walking without pain  NEXT MD VISIT: unknown  OBJECTIVE:  Note: Objective measures were completed at Evaluation unless otherwise noted.  DIAGNOSTIC FINDINGS:  12/12/23  2 view radiographs of the lumbar spine shows a degenerative scoliosis with  complete collapse of the disc space of the lumbar spine with periarticular  bony spurs.   PATIENT SURVEYS:  PSFS: THE PATIENT SPECIFIC FUNCTIONAL SCALE  Place score of 0-10 (0 = unable to perform activity and 10 = able to perform activity at the same level as before injury or problem)  Activity Date: 01/26/24    Walking for 20 min 5    2.standing for 30 min 4    3.yardwork for 30 min 5    4.      Total Score 4.66      Total Score = Sum of activity scores/number of activities  Minimally Detectable Change: 3 points (for single activity); 2 points (for average score)  [facility] (nd). The Patient Specific Functional Scale . Retrieved from [email or web address]   COGNITION: Overall cognitive status: Within functional limits for tasks assessed     SENSATION: WFL  MUSCLE LENGTH: Hamstrings: Right/Left mod restriction deg   POSTURE: scap winging L , L shoulder higher, flattened T/L spine,dec axilla space R, L genu valgum   PALPATION: 1+ tenderness at L GM, TFL and pvm  LUMBAR ROM:   AROM eval  Flexion 75%  Extension WNL  Right lateral flexion 50%  Left lateral flexion 50%  Right rotation 75%  Left rotation WNL   (Blank  rows = not tested)  LOWER EXTREMITY ROM:   WFL throughout  A/P ROM Right eval Left eval  Hip flexion  Hip extension    Hip abduction    Hip adduction    Hip internal rotation    Hip external rotation    Knee flexion    Knee extension    Ankle dorsiflexion    Ankle plantarflexion    Ankle inversion    Ankle eversion     (Blank rows = not tested)  LOWER EXTREMITY MMT:    MMT Right eval Left eval  Hip flexion  4  Hip extension    Hip abduction  4  Hip adduction    Hip internal rotation    Hip external rotation    Knee flexion    Knee extension    Ankle dorsiflexion 5- 5-  Ankle plantarflexion    Ankle inversion    Ankle eversion     (Blank rows = not tested)  LUMBAR SPECIAL TESTS:  SLR L negative  FUNCTIONAL TESTS:  5 times sit to stand: 13 sec no UE use  GAIT: Distance walked: 20 min community Assistive device utilized: None Level of assistance: Complete Independence   TREATMENT DATE: Lumbar 02/27/24***     02/23/24 There Ex for ROM/strengthening  Nustep level 5 8 min SKTC  25 sec 3x each side Lumbar rotations 2x20 Neuro Re ed for posture/stabilization PPT 2x10 Bridges 3x10 with T band G Tband green hip abd 3x10 Manual Percussive device to lumbar pvm    02/20/24 There Ex for ROM/strengthening  Nustep level 5 8 min Leg press 75# 3x10 SKTC  25 sec 3x each side Lumbar rotations 2x20 Neuro Re ed for posture/stabilization PPT 2x10 Bridges 3x10 with T band G Tband green hip abd 3x10 Manual Percussive device to lumbar pvm    02/13/24 There Ex for ROM/strengthening  Nustep level 4 8 min Leg press 75# 3x10 SKTC  25 sec 3x each side Lumbar rotations 2x20 Neuro Re ed for posture/stabilization PPT 2x10 Bridges 2x10 with T band G Tband green hip abd 3x10 Manual Percussive device to lumbar pvm       02/06/24  There Ex for ROM/strengthening  Rec bike 6 min level 2 Leg press 75# 3x10 SKTC  25 sec 3x each side Lumbar rotations  2x20 Neuro Re ed for posture/stabilization PPT 2x10 Bridges 2x10 Tband green hip abd 3x10 Manual Percussive device to lumbar pvm  01/26/24 Initial evaluation completed followed by trial set of HEP.                                                                                                                                  PATIENT EDUCATION:  Education details: HEP Person educated: Patient Education method: Explanation, Demonstration, Actor cues, and Verbal cues Education comprehension: verbalized understanding, returned demonstration, and verbal cues required  HOME EXERCISE PROGRAM: Access Code: [email or web address] URL: [email or web address] Date: 01/26/2024 Prepared by: Burnard Meth  Exercises - Supine Posterior Pelvic Tilt  - 2 x daily - 7 x weekly - 2 sets - 10 reps -  3 hold - Supine Bridge  - 2 x daily - 7 x weekly - 2 sets - 10 reps - 1 hold - Lower Trunk Rotations  - 2 x daily - 7 x weekly - 2 sets - 10 reps - 2 hold - Supine Double Knee to Chest  - 2 x daily - 7 x weekly - 1 sets - 4 reps - 25 hold  ASSESSMENT:  CLINICAL IMPRESSION:***Needed VC for T band bridges.  Demonstrated understanding.   OBJECTIVE IMPAIRMENTS: decreased balance, decreased endurance, decreased mobility, difficulty walking, decreased ROM, decreased strength, impaired flexibility, and pain.   ACTIVITY LIMITATIONS: bending, sitting, standing, squatting, transfers, and caring for others  PARTICIPATION LIMITATIONS: meal prep, community activity, and yard work  PERSONAL FACTORS: Age are also affecting patient's functional outcome.   REHAB POTENTIAL: Good  CLINICAL DECISION MAKING: Stable/uncomplicated  EVALUATION COMPLEXITY: Moderate   GOALS: Goals reviewed with patient? Yes  SHORT TERM GOALS: Target date: 02/23/2024    Pt to be independent with HEP. Baseline: Goal status:MET 10/14  2.  Decrease level of pain by 1 grade. Baseline:  Goal status: MET 10/14  LONG TERM GOALS:  Target date: 03/22/2024    Pt to be independent with self progressive HEP by discharge. Baseline:  Goal status: INITIAL  2.  Decrease pain to 2/10 max with all activities. Baseline:  Goal status: INITIAL  3.  Increase AROM by 25%. Baseline:  Goal status: INITIAL  4.  Increase strength by 1/2 to 1 full grade where affected. Baseline:  Goal status: INITIAL  5.  Improve PSFS by at least 2 points for measurable difference. Baseline:  Goal status: INITIAL  6.  Able to stand, ambulate or do yardwork for at least 45 minutes with minimal to no pain. Baseline: 20 min Goal status: INITIAL  PLAN:  PT FREQUENCY: 2x/week  PT DURATION: 8 weeks  PLANNED INTERVENTIONS: 97164- PT Re-evaluation, 97110-Therapeutic exercises, 97530- Therapeutic activity, 97112- Neuromuscular re-education, 97535- Self Care, 02859- Manual therapy, [phone]- Gait training, [phone]- Aquatic Therapy, [phone]- Electrical stimulation (unattended), Patient/Family education, Balance training, Joint mobilization, Spinal mobilization, Cryotherapy, and Moist heat.  PLAN FOR NEXT SESSION: ***Review MRI findings and reassess.     Burnard CHRISTELLA Meth, PT 02/26/2024, 7:54 AM

## 2024-02-27 ENCOUNTER — Encounter

## 2024-02-27 NOTE — Therapy (Signed)
 OUTPATIENT PHYSICAL THERAPY THORACOLUMBAR TREATMENT   Patient Name: Valerie Love MRN: [id number] DOB:January 05, 1956, 68 y.o., female Today's Date: 02/20/24  END OF SESSION:        Past Medical History:  Diagnosis Date   Anxiety    Arthritis    R knee, back, hands    Cellulitis of knee 04/2016   RT KNEE   Cellulitis of right knee 04/18/2016   Childhood asthma    Complication of anesthesia    woke up slowly - 65yrs. ago   Depression    related to husband illness and death   History of jaundice    as a child     Hx of varicella    Hyperthyroidism    during pregnancy, treated & resolved post partum    IBS (irritable bowel syndrome)    LBBB (left bundle branch block)    Scoliosis    Scoliosis 07/2015   Thyroid  disease in pregnancy    TOBACCO DEPENDENCE 07/06/2006   Qualifier: Diagnosis of  By: Manford Longs     Troponin level elevated    Past Surgical History:  Procedure Laterality Date   ATRIAL FIBRILLATION ABLATION N/A 08/11/2022   Procedure: ATRIAL FIBRILLATION ABLATION;  Surgeon: Inocencio Soyla Lunger, MD;  Location: [facility];  Service: Cardiovascular;  Laterality: N/A;   BREAST BIOPSY Right 1979   benign    Cyst removed from ovary     KNEE SURGERY     right duda    LEFT HEART CATH AND CORONARY ANGIOGRAPHY N/A 10/31/2018   Procedure: LEFT HEART CATH AND CORONARY ANGIOGRAPHY;  Surgeon: Burnard Debby LABOR, MD;  Location: [facility];  Service: Cardiovascular;  Laterality: N/A;   TONSILLECTOMY AND ADENOIDECTOMY  1073   TOTAL KNEE ARTHROPLASTY 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   Procedure: TOTAL KNEE ARTHROPLASTY;  Surgeon: Jerona LULLA Sage, MD;  Location: [facility];  Service: Orthopedics;  Laterality: Right;   TUBAL LIGATION     WISDOM TOOTH EXTRACTION     Patient Active Problem List   Diagnosis Date Noted   Paroxysmal atrial fibrillation (HCC) 06/05/2023   Asthma in adult 10/25/2022   Hypercoagulable state due to paroxysmal atrial fibrillation (HCC) 11/16/2021   Atrial  fibrillation with RVR (HCC) 11/10/2021   Atrial fibrillation with rapid ventricular response (HCC) 11/09/2021   Acute systolic heart failure (HCC) 11/01/2018   NSTEMI (non-ST elevated myocardial infarction) (HCC) 10/30/2018   Hyperlipidemia 06/13/2018   Lactose intolerance 06/13/2018   S/P total knee arthroplasty, right 03/16/2016   Unilateral primary osteoarthritis, right knee    Special screening for malignant neoplasms, colon 12/29/2014   Diarrhea 12/29/2014   Medication management 05/13/2013   Does not have health insurance 05/13/2013   Adjustment reaction with anxiety and depression 03/12/2012   Medication monitoring encounter 03/12/2012   Hemorrhoids 08/13/2011   Perineal itching, female 08/13/2011   LBBB (left bundle branch block) 09/24/2010   Murmur 09/08/2010   EKG, abnormal 08/19/2010   Visit for preventive health examination 08/19/2010   Palpitations 08/18/2010   Dizziness 08/18/2010   Arthritis 08/18/2010   Anxiety state 06/18/2008   GRIEF REACTION 06/18/2008   DEPRESSION, MAJOR, RECURRENT 07/06/2006   ARTHRALGIA, UNSPECIFIED 07/06/2006    PCP: Ozell Heron HERO, MD   REFERRING PROVIDER: Ozell Heron HERO, MD   REFERRING DIAG:  Diagnosis  [id number] (ICD-10-CM) - Degeneration of intervertebral disc of lumbar region with discogenic back pain and lower extremity pain    Rationale for Evaluation and Treatment: Rehabilitation  THERAPY DIAG:  No diagnosis found.  ONSET DATE: 7/25  SUBJECTIVE:                                                                                                                                                                                           SUBJECTIVE STATEMENT: ***Pt reports back feeling better than last visit.  Able to do more around the house.  PERTINENT HISTORY:  No spinal surgeries  PAIN:  ***Are you having pain? Yes: NPRS scale: July 8/10 today 5/10 Pain location: L LE posterior/lateral and L LBP Pain  description: tingling, numbness, sharp Aggravating factors: walking, yardwork, prolonged standing/sitting Relieving factors: medication, heat  PRECAUTIONS: None  RED FLAGS: None   WEIGHT BEARING RESTRICTIONS: No  FALLS:  Has patient fallen in last 6 months? No  LIVING ENVIRONMENT: Lives with: lives with their family and lives alone Lives in: House/apartment   OCCUPATION: Financial trader for [employer]  PLOF: Independent  PATIENT GOALS: walking without pain  NEXT MD VISIT: unknown  OBJECTIVE:  Note: Objective measures were completed at Evaluation unless otherwise noted.  DIAGNOSTIC FINDINGS:  12/12/23  2 view radiographs of the lumbar spine shows a degenerative scoliosis with  complete collapse of the disc space of the lumbar spine with periarticular  bony spurs.   PATIENT SURVEYS:  PSFS: THE PATIENT SPECIFIC FUNCTIONAL SCALE  Place score of 0-10 (0 = unable to perform activity and 10 = able to perform activity at the same level as before injury or problem)  Activity Date: 01/26/24    Walking for 20 min 5    2.standing for 30 min 4    3.yardwork for 30 min 5    4.      Total Score 4.66      Total Score = Sum of activity scores/number of activities  Minimally Detectable Change: 3 points (for single activity); 2 points (for average score)  [facility] (nd). The Patient Specific Functional Scale . Retrieved from [email or web address].com.[email or web address]   COGNITION: Overall cognitive status: Within functional limits for tasks assessed     SENSATION: WFL  MUSCLE LENGTH: Hamstrings: Right/Left mod restriction deg   POSTURE: scap winging L , L shoulder higher, flattened T/L spine,dec axilla space R, L genu valgum   PALPATION: 1+ tenderness at L GM, TFL and pvm  LUMBAR ROM:   AROM eval  Flexion 75%  Extension WNL  Right lateral flexion 50%  Left lateral flexion 50%  Right rotation 75%  Left rotation WNL   (Blank  rows = not tested)  LOWER EXTREMITY ROM:   WFL throughout  A/P ROM Right eval Left eval  Hip flexion  Hip extension    Hip abduction    Hip adduction    Hip internal rotation    Hip external rotation    Knee flexion    Knee extension    Ankle dorsiflexion    Ankle plantarflexion    Ankle inversion    Ankle eversion     (Blank rows = not tested)  LOWER EXTREMITY MMT:    MMT Right eval Left eval  Hip flexion  4  Hip extension    Hip abduction  4  Hip adduction    Hip internal rotation    Hip external rotation    Knee flexion    Knee extension    Ankle dorsiflexion 5- 5-  Ankle plantarflexion    Ankle inversion    Ankle eversion     (Blank rows = not tested)  LUMBAR SPECIAL TESTS:  SLR L negative  FUNCTIONAL TESTS:  5 times sit to stand: 13 sec no UE use  GAIT: Distance walked: 20 min community Assistive device utilized: None Level of assistance: Complete Independence   TREATMENT DATE: Lumbar 02/29/24***     02/23/24 There Ex for ROM/strengthening  Nustep level 5 8 min SKTC  25 sec 3x each side Lumbar rotations 2x20 Neuro Re ed for posture/stabilization PPT 2x10 Bridges 3x10 with T band G Tband green hip abd 3x10 Manual Percussive device to lumbar pvm    02/20/24 There Ex for ROM/strengthening  Nustep level 5 8 min Leg press 75# 3x10 SKTC  25 sec 3x each side Lumbar rotations 2x20 Neuro Re ed for posture/stabilization PPT 2x10 Bridges 3x10 with T band G Tband green hip abd 3x10 Manual Percussive device to lumbar pvm    02/13/24 There Ex for ROM/strengthening  Nustep level 4 8 min Leg press 75# 3x10 SKTC  25 sec 3x each side Lumbar rotations 2x20 Neuro Re ed for posture/stabilization PPT 2x10 Bridges 2x10 with T band G Tband green hip abd 3x10 Manual Percussive device to lumbar pvm       02/06/24  There Ex for ROM/strengthening  Rec bike 6 min level 2 Leg press 75# 3x10 SKTC  25 sec 3x each side Lumbar rotations  2x20 Neuro Re ed for posture/stabilization PPT 2x10 Bridges 2x10 Tband green hip abd 3x10 Manual Percussive device to lumbar pvm  01/26/24 Initial evaluation completed followed by trial set of HEP.                                                                                                                                  PATIENT EDUCATION:  Education details: HEP Person educated: Patient Education method: Explanation, Demonstration, Actor cues, and Verbal cues Education comprehension: verbalized understanding, returned demonstration, and verbal cues required  HOME EXERCISE PROGRAM: Access Code: [email or web address] URL: [email or web address] Date: 01/26/2024 Prepared by: Burnard Meth  Exercises - Supine Posterior Pelvic Tilt  - 2 x daily - 7 x weekly - 2 sets - 10 reps -  3 hold - Supine Bridge  - 2 x daily - 7 x weekly - 2 sets - 10 reps - 1 hold - Lower Trunk Rotations  - 2 x daily - 7 x weekly - 2 sets - 10 reps - 2 hold - Supine Double Knee to Chest  - 2 x daily - 7 x weekly - 1 sets - 4 reps - 25 hold  ASSESSMENT:  CLINICAL IMPRESSION:***Needed VC for T band bridges.  Demonstrated understanding.   OBJECTIVE IMPAIRMENTS: decreased balance, decreased endurance, decreased mobility, difficulty walking, decreased ROM, decreased strength, impaired flexibility, and pain.   ACTIVITY LIMITATIONS: bending, sitting, standing, squatting, transfers, and caring for others  PARTICIPATION LIMITATIONS: meal prep, community activity, and yard work  PERSONAL FACTORS: Age are also affecting patient's functional outcome.   REHAB POTENTIAL: Good  CLINICAL DECISION MAKING: Stable/uncomplicated  EVALUATION COMPLEXITY: Moderate   GOALS: Goals reviewed with patient? Yes  SHORT TERM GOALS: Target date: 02/23/2024    Pt to be independent with HEP. Baseline: Goal status:MET 10/14  2.  Decrease level of pain by 1 grade. Baseline:  Goal status: MET 10/14  LONG TERM GOALS:  Target date: 03/22/2024    Pt to be independent with self progressive HEP by discharge. Baseline:  Goal status: INITIAL  2.  Decrease pain to 2/10 max with all activities. Baseline:  Goal status: INITIAL  3.  Increase AROM by 25%. Baseline:  Goal status: INITIAL  4.  Increase strength by 1/2 to 1 full grade where affected. Baseline:  Goal status: INITIAL  5.  Improve PSFS by at least 2 points for measurable difference. Baseline:  Goal status: INITIAL  6.  Able to stand, ambulate or do yardwork for at least 45 minutes with minimal to no pain. Baseline: 20 min Goal status: INITIAL  PLAN:  PT FREQUENCY: 2x/week  PT DURATION: 8 weeks  PLANNED INTERVENTIONS: 97164- PT Re-evaluation, 97110-Therapeutic exercises, 97530- Therapeutic activity, 97112- Neuromuscular re-education, 97535- Self Care, 02859- Manual therapy, [phone]- Gait training, [phone]- Aquatic Therapy, [phone]- Electrical stimulation (unattended), Patient/Family education, Balance training, Joint mobilization, Spinal mobilization, Cryotherapy, and Moist heat.  PLAN FOR NEXT SESSION: ***Review MRI findings and reassess.     Burnard CHRISTELLA Meth, PT 02/27/2024, 1:59 PM

## 2024-02-29 ENCOUNTER — Other Ambulatory Visit: Payer: Self-pay

## 2024-02-29 ENCOUNTER — Ambulatory Visit: Payer: Self-pay

## 2024-02-29 ENCOUNTER — Ambulatory Visit

## 2024-02-29 DIAGNOSIS — M5416 Radiculopathy, lumbar region: Secondary | ICD-10-CM

## 2024-02-29 DIAGNOSIS — M5459 Other low back pain: Secondary | ICD-10-CM | POA: Diagnosis not present

## 2024-02-29 DIAGNOSIS — R293 Abnormal posture: Secondary | ICD-10-CM | POA: Diagnosis not present

## 2024-02-29 DIAGNOSIS — M62838 Other muscle spasm: Secondary | ICD-10-CM

## 2024-02-29 DIAGNOSIS — M6281 Muscle weakness (generalized): Secondary | ICD-10-CM

## 2024-02-29 DIAGNOSIS — M51362 Other intervertebral disc degeneration, lumbar region with discogenic back pain and lower extremity pain: Secondary | ICD-10-CM

## 2024-02-29 NOTE — Telephone Encounter (Signed)
 I called and sw pt to advise of message below. She has an appt on Monday and would like to keep this appt to discuss with Dr. Duda and also to get an injection in her shoulder. Advised would go ahead and put in referral for FN and we will see her on Monday.

## 2024-02-29 NOTE — Telephone Encounter (Signed)
-----   Message from Jerona LULLA Sage sent at 02/29/2024  6:51 AM EDT ----- Alene have her follow up with [facility] for evaluation of esi ----- Message ----- From: Interface, Rad Results In Sent: 02/29/2024   3:04 AM EDT To: Jerona Sage LULLA, MD

## 2024-03-04 ENCOUNTER — Ambulatory Visit: Admitting: Orthopedic Surgery

## 2024-03-04 DIAGNOSIS — M51362 Other intervertebral disc degeneration, lumbar region with discogenic back pain and lower extremity pain: Secondary | ICD-10-CM | POA: Diagnosis not present

## 2024-03-04 DIAGNOSIS — G8929 Other chronic pain: Secondary | ICD-10-CM

## 2024-03-04 DIAGNOSIS — M5442 Lumbago with sciatica, left side: Secondary | ICD-10-CM

## 2024-03-04 DIAGNOSIS — M25511 Pain in right shoulder: Secondary | ICD-10-CM | POA: Diagnosis not present

## 2024-03-04 MED ORDER — PREDNISONE 10 MG PO TABS
10.0000 mg | ORAL_TABLET | Freq: Every day | ORAL | 0 refills | Status: AC
Start: 2024-03-04 — End: ?

## 2024-03-04 NOTE — Therapy (Signed)
 OUTPATIENT PHYSICAL THERAPY THORACOLUMBAR TREATMENT   Patient Name: Valerie Love MRN: [id number] DOB:05/01/1956, 68 y.o., female Today's Date: 02/20/24  END OF SESSION:         Past Medical History:  Diagnosis Date   Anxiety    Arthritis    R knee, back, hands    Cellulitis of knee 04/2016   RT KNEE   Cellulitis of right knee 04/18/2016   Childhood asthma    Complication of anesthesia    woke up slowly - 11yrs. ago   Depression    related to husband illness and death   History of jaundice    as a child     Hx of varicella    Hyperthyroidism    during pregnancy, treated & resolved post partum    IBS (irritable bowel syndrome)    LBBB (left bundle branch block)    Scoliosis    Scoliosis 07/2015   Thyroid  disease in pregnancy    TOBACCO DEPENDENCE 07/06/2006   Qualifier: Diagnosis of  By: Manford Longs     Troponin level elevated    Past Surgical History:  Procedure Laterality Date   ATRIAL FIBRILLATION ABLATION N/A 08/11/2022   Procedure: ATRIAL FIBRILLATION ABLATION;  Surgeon: Inocencio Soyla Lunger, MD;  Location: [facility];  Service: Cardiovascular;  Laterality: N/A;   BREAST BIOPSY Right 1979   benign    Cyst removed from ovary     KNEE SURGERY     right duda    LEFT HEART CATH AND CORONARY ANGIOGRAPHY N/A 10/31/2018   Procedure: LEFT HEART CATH AND CORONARY ANGIOGRAPHY;  Surgeon: Burnard Debby LABOR, MD;  Location: [facility];  Service: Cardiovascular;  Laterality: N/A;   TONSILLECTOMY AND ADENOIDECTOMY  1073   TOTAL KNEE ARTHROPLASTY 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   Procedure: TOTAL KNEE ARTHROPLASTY;  Surgeon: Jerona LULLA Sage, MD;  Location: [facility];  Service: Orthopedics;  Laterality: Right;   TUBAL LIGATION     WISDOM TOOTH EXTRACTION     Patient Active Problem List   Diagnosis Date Noted   Paroxysmal atrial fibrillation (HCC) 06/05/2023   Asthma in adult 10/25/2022   Hypercoagulable state due to paroxysmal atrial fibrillation (HCC) 11/16/2021   Atrial  fibrillation with RVR (HCC) 11/10/2021   Atrial fibrillation with rapid ventricular response (HCC) 11/09/2021   Acute systolic heart failure (HCC) 11/01/2018   NSTEMI (non-ST elevated myocardial infarction) (HCC) 10/30/2018   Hyperlipidemia 06/13/2018   Lactose intolerance 06/13/2018   S/P total knee arthroplasty, right 03/16/2016   Unilateral primary osteoarthritis, right knee    Special screening for malignant neoplasms, colon 12/29/2014   Diarrhea 12/29/2014   Medication management 05/13/2013   Does not have health insurance 05/13/2013   Adjustment reaction with anxiety and depression 03/12/2012   Medication monitoring encounter 03/12/2012   Hemorrhoids 08/13/2011   Perineal itching, female 08/13/2011   LBBB (left bundle branch block) 09/24/2010   Murmur 09/08/2010   EKG, abnormal 08/19/2010   Visit for preventive health examination 08/19/2010   Palpitations 08/18/2010   Dizziness 08/18/2010   Arthritis 08/18/2010   Anxiety state 06/18/2008   GRIEF REACTION 06/18/2008   DEPRESSION, MAJOR, RECURRENT 07/06/2006   ARTHRALGIA, UNSPECIFIED 07/06/2006    PCP: Ozell Heron HERO, MD   REFERRING PROVIDER: Ozell Heron HERO, MD   REFERRING DIAG:  Diagnosis  [phone] (ICD-10-CM) - Degeneration of intervertebral disc of lumbar region with discogenic back pain and lower extremity pain    Rationale for Evaluation and Treatment: Rehabilitation  THERAPY DIAG:  No diagnosis found.  ONSET DATE: 7/25  SUBJECTIVE:                                                                                                                                                                                           SUBJECTIVE STATEMENT: ***Pt reports she had her MRI and will discuss with MD next week.  Restricted by pain since last visit.  PERTINENT HISTORY:  No spinal surgeries  PAIN:  ***Are you having pain? Yes: NPRS scale: July 8/10 today 4/10 Pain location: L LE posterior/lateral and L  LBP Pain description: tingling, numbness, sharp Aggravating factors: walking, yardwork, prolonged standing/sitting Relieving factors: medication, heat  PRECAUTIONS: None  RED FLAGS: None   WEIGHT BEARING RESTRICTIONS: No  FALLS:  Has patient fallen in last 6 months? No  LIVING ENVIRONMENT: Lives with: lives with their family and lives alone Lives in: House/apartment   OCCUPATION: financial trader for [employer]  PLOF: Independent  PATIENT GOALS: walking without pain  NEXT MD VISIT: unknown  OBJECTIVE:  Note: Objective measures were completed at Evaluation unless otherwise noted.  DIAGNOSTIC FINDINGS:  12/12/23  2 view radiographs of the lumbar spine shows a degenerative scoliosis with  complete collapse of the disc space of the lumbar spine with periarticular  bony spurs.   PATIENT SURVEYS:  PSFS: THE PATIENT SPECIFIC FUNCTIONAL SCALE  Place score of 0-10 (0 = unable to perform activity and 10 = able to perform activity at the same level as before injury or problem)  Activity Date: 01/26/24    Walking for 20 min 5    2.standing for 30 min 4    3.yardwork for 30 min 5    4.      Total Score 4.66      Total Score = Sum of activity scores/number of activities  Minimally Detectable Change: 3 points (for single activity); 2 points (for average score)  [facility] (nd). The Patient Specific Functional Scale . Retrieved from [email or web address]   COGNITION: Overall cognitive status: Within functional limits for tasks assessed     SENSATION: WFL  MUSCLE LENGTH: Hamstrings: Right/Left mod restriction deg   POSTURE: scap winging L , L shoulder higher, flattened T/L spine,dec axilla space R, L genu valgum   PALPATION: 1+ tenderness at L GM, TFL and pvm  LUMBAR ROM:   AROM eval  Flexion 75%  Extension WNL  Right lateral flexion 50%  Left lateral flexion 50%  Right rotation 75%  Left rotation WNL    (Blank rows = not tested)  LOWER EXTREMITY ROM:   WFL throughout  A/P ROM Right eval Left eval  Hip flexion    Hip extension    Hip abduction    Hip adduction    Hip internal rotation    Hip external rotation    Knee flexion    Knee extension    Ankle dorsiflexion    Ankle plantarflexion    Ankle inversion    Ankle eversion     (Blank rows = not tested)  LOWER EXTREMITY MMT:    MMT Right eval Left eval  Hip flexion  4  Hip extension    Hip abduction  4  Hip adduction    Hip internal rotation    Hip external rotation    Knee flexion    Knee extension    Ankle dorsiflexion 5- 5-  Ankle plantarflexion    Ankle inversion    Ankle eversion     (Blank rows = not tested)  LUMBAR SPECIAL TESTS:  SLR L negative  FUNCTIONAL TESTS:  5 times sit to stand: 13 sec no UE use  GAIT: Distance walked: 20 min community Assistive device utilized: None Level of assistance: Complete Independence   TREATMENT DATE: Lumbar 03/05/24***     02/29/24 There Ex for ROM/strengthening  Nustep level 5 8 min SKTC  25 sec 3x each side Lumbar rotations 2x20 Neuro Re ed for posture/stabilization PPT 2x10 Bridges 3x10 with T band G Tband green hip abd 3x10 Manual Percussive device to lumbar pvm    02/23/24 There Ex for ROM/strengthening  Nustep level 5 8 min SKTC  25 sec 3x each side Lumbar rotations 2x20 Neuro Re ed for posture/stabilization PPT 2x10 Bridges 3x10 with T band G Tband green hip abd 3x10 Manual Percussive device to lumbar pvm    02/20/24 There Ex for ROM/strengthening  Nustep level 5 8 min Leg press 75# 3x10 SKTC  25 sec 3x each side Lumbar rotations 2x20 Neuro Re ed for posture/stabilization PPT 2x10 Bridges 3x10 with T band G Tband green hip abd 3x10 Manual Percussive device to lumbar pvm    02/13/24 There Ex for ROM/strengthening  Nustep level 4 8 min Leg press 75# 3x10 SKTC  25 sec 3x each side Lumbar rotations 2x20 Neuro Re ed  for posture/stabilization PPT 2x10 Bridges 2x10 with T band G Tband green hip abd 3x10 Manual Percussive device to lumbar pvm       02/06/24  There Ex for ROM/strengthening  Rec bike 6 min level 2 Leg press 75# 3x10 SKTC  25 sec 3x each side Lumbar rotations 2x20 Neuro Re ed for posture/stabilization PPT 2x10 Bridges 2x10 Tband green hip abd 3x10 Manual Percussive device to lumbar pvm  01/26/24 Initial evaluation completed followed by trial set of HEP.                                                                                                                                  PATIENT EDUCATION:  Education details: HEP Person educated: Patient Education method: Explanation,  Demonstration, Tactile cues, and Verbal cues Education comprehension: verbalized understanding, returned demonstration, and verbal cues required  HOME EXERCISE PROGRAM: Access Code: [email or web address] URL: [email or web address] Date: 01/26/2024 Prepared by: Burnard Meth  Exercises - Supine Posterior Pelvic Tilt  - 2 x daily - 7 x weekly - 2 sets - 10 reps - 3 hold - Supine Bridge  - 2 x daily - 7 x weekly - 2 sets - 10 reps - 1 hold - Lower Trunk Rotations  - 2 x daily - 7 x weekly - 2 sets - 10 reps - 2 hold - Supine Double Knee to Chest  - 2 x daily - 7 x weekly - 1 sets - 4 reps - 25 hold  ASSESSMENT:  CLINICAL IMPRESSION: ***Pt reported feeling more mobile after percussive device.  OBJECTIVE IMPAIRMENTS: decreased balance, decreased endurance, decreased mobility, difficulty walking, decreased ROM, decreased strength, impaired flexibility, and pain.   ACTIVITY LIMITATIONS: bending, sitting, standing, squatting, transfers, and caring for others  PARTICIPATION LIMITATIONS: meal prep, community activity, and yard work  PERSONAL FACTORS: Age are also affecting patient's functional outcome.   REHAB POTENTIAL: Good  CLINICAL DECISION MAKING: Stable/uncomplicated  EVALUATION  COMPLEXITY: Moderate   GOALS: Goals reviewed with patient? Yes  SHORT TERM GOALS: Target date: 02/23/2024    Pt to be independent with HEP. Baseline: Goal status:MET 10/14  2.  Decrease level of pain by 1 grade. Baseline:  Goal status: MET 10/14  LONG TERM GOALS: Target date: 03/22/2024    Pt to be independent with self progressive HEP by discharge. Baseline:  Goal status: INITIAL  2.  Decrease pain to 2/10 max with all activities. Baseline:  Goal status: INITIAL  3.  Increase AROM by 25%. Baseline:  Goal status: INITIAL  4.  Increase strength by 1/2 to 1 full grade where affected. Baseline:  Goal status: INITIAL  5.  Improve PSFS by at least 2 points for measurable difference. Baseline:  Goal status: INITIAL  6.  Able to stand, ambulate or do yardwork for at least 45 minutes with minimal to no pain. Baseline: 20 min Goal status: INITIAL  PLAN:  PT FREQUENCY: 2x/week  PT DURATION: 8 weeks  PLANNED INTERVENTIONS: 97164- PT Re-evaluation, 97110-Therapeutic exercises, 97530- Therapeutic activity, 97112- Neuromuscular re-education, 97535- Self Care, 02859- Manual therapy, [phone]- Gait training, [phone]- Aquatic Therapy, [phone]- Electrical stimulation (unattended), Patient/Family education, Balance training, Joint mobilization, Spinal mobilization, Cryotherapy, and Moist heat.  PLAN FOR NEXT SESSION: ***Await patient followup with MD to discuss next steps after MRI.   Burnard CHRISTELLA Meth, PT 03/04/2024, 7:54 AM

## 2024-03-05 ENCOUNTER — Encounter: Payer: Self-pay | Admitting: Orthopedic Surgery

## 2024-03-05 ENCOUNTER — Ambulatory Visit

## 2024-03-05 DIAGNOSIS — M5459 Other low back pain: Secondary | ICD-10-CM | POA: Diagnosis not present

## 2024-03-05 DIAGNOSIS — M51362 Other intervertebral disc degeneration, lumbar region with discogenic back pain and lower extremity pain: Secondary | ICD-10-CM | POA: Diagnosis not present

## 2024-03-05 DIAGNOSIS — M25511 Pain in right shoulder: Secondary | ICD-10-CM

## 2024-03-05 DIAGNOSIS — M62838 Other muscle spasm: Secondary | ICD-10-CM | POA: Diagnosis not present

## 2024-03-05 DIAGNOSIS — M6281 Muscle weakness (generalized): Secondary | ICD-10-CM

## 2024-03-05 DIAGNOSIS — M5416 Radiculopathy, lumbar region: Secondary | ICD-10-CM

## 2024-03-05 DIAGNOSIS — G8929 Other chronic pain: Secondary | ICD-10-CM

## 2024-03-05 DIAGNOSIS — R293 Abnormal posture: Secondary | ICD-10-CM | POA: Diagnosis not present

## 2024-03-05 MED ORDER — LIDOCAINE HCL 1 % IJ SOLN
5.0000 mL | INTRAMUSCULAR | Status: AC | PRN
  Administered 2024-03-05: 5 mL

## 2024-03-05 MED ORDER — METHYLPREDNISOLONE ACETATE 40 MG/ML IJ SUSP
40.0000 mg | INTRAMUSCULAR | Status: AC | PRN
  Administered 2024-03-05: 40 mg via INTRA_ARTICULAR

## 2024-03-05 NOTE — Progress Notes (Signed)
 Office Visit Note   Patient: Valerie Love           Date of Birth: 21-Jul-1955           MRN: [id number] Visit Date: 03/04/2024              Requested by: Valerie Heron HERO, MD [address] [place],  [place] [address] PCP: Valerie Heron HERO, MD  Chief Complaint  Patient presents with   Right Shoulder - Pain   Lower Back - Follow-up    MRI review      HPI: Discussed the use of AI scribe software for clinical note transcription with the patient, who gave verbal consent to proceed.  History of Present Illness Valerie Love is a 68 year old female who presents for follow-up after an MRI scan for lumbar radicular pain and right shoulder impingement syndrome.  She experiences ongoing lumbar radicular pain. An MRI scan was performed to evaluate her lumbar spine.  She also has pain in the right shoulder. 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     Assessment & Plan: Visit Diagnoses:  1. Degeneration of intervertebral disc of lumbar region with discogenic back pain and lower extremity pain     Plan: Assessment and Plan Assessment & Plan Right shoulder impingement syndrome Pain with near at [place] impingement test of the right shoulder. - Plan repeat injection for the right shoulder.  Lumbar radicular pain with facet arthropathy and degenerative disc disease MRI shows facet arthropathy at L5-S1 without central stenosis, disc bulging at L3-4 with right foraminal stenosis, and disc space narrowing with disc bulging at L1-2 without stenosis. No focal motor weakness. - Refer to [facility] for evaluation and administration of epidural steroid injections. - Prescribe prednisone  until appointment with Doctor [facility]onna. - Recommend [facility] for back pain relief.      Follow-Up Instructions: Return if symptoms worsen or fail to improve.   Ortho Exam  Patient is alert, oriented, no adenopathy, well-dressed, normal affect, normal  respiratory effort. Physical Exam MUSCULOSKELETAL: Pain with [place] impingement test in the right shoulder. NEUROLOGICAL: No focal motor weakness in the lower extremities.  Lumbar spine radicular pain      Imaging: No results found. No images are attached to the encounter.  Labs: Lab Results  Component Value Date   HGBA1C 5.6 10/05/2023   HGBA1C 5.6 06/22/2021   HGBA1C 5.5 08/28/2020   ESRSEDRATE 10 06/22/2021   ESRSEDRATE 17 04/18/2016   ESRSEDRATE 9 08/18/2010   CRP <1.0 06/22/2021   CRP 0.8 04/18/2016   GRAMSTAIN No WBC Seen 04/18/2016   GRAMSTAIN No Squamous Epithelial Cells Seen 04/18/2016   GRAMSTAIN No Organisms Seen 04/18/2016   LABORGA Few GROUP B STREP (S.AGALACTIAE) ISOLATED 04/18/2016     Lab Results  Component Value Date   ALBUMIN 4.4 10/05/2023   ALBUMIN 4.1 10/25/2022   ALBUMIN 4.2 06/22/2021    Lab Results  Component Value Date   MG 2.0 03/07/2023   MG 1.9 11/09/2021   MG 2.3 11/12/2018   No results found for: VD25OH  No results found for: PREALBUMIN    Latest Ref Rng & Units 10/03/2023   10:59 AM 03/07/2023    5:42 PM 07/28/2022   10:40 AM  CBC EXTENDED  WBC 3.4 - 10.8 x10E3/uL 7.0  8.5  4.9   RBC 3.77 - 5.28 x10E6/uL 4.70  4.72  4.50   Hemoglobin 11.1 - 15.9 g/dL 85.8  85.2  13.9  HCT 34.0 - 46.6 % 45.4  43.6  43.2   Platelets 150 - 450 x10E3/uL 319  274  258      There is no height or weight on file to calculate BMI.  Orders:  No orders of the defined types were placed in this encounter.  Meds ordered this encounter  Medications   predniSONE  (DELTASONE ) 10 MG tablet    Sig: Take 1 tablet (10 mg total) by mouth daily with breakfast.    Dispense:  30 tablet    Refill:  0     Procedures: Large Joint Inj: R subacromial bursa on 03/05/2024 10:32 AM Indications: diagnostic evaluation and pain Details: 22 G 1.5 in needle, posterior approach  Arthrogram: No  Medications: 5 mL lidocaine  1 %; 40 mg methylPREDNISolone  acetate  40 MG/ML Outcome: tolerated well, no immediate complications Procedure, treatment alternatives, risks and benefits explained, specific risks discussed. Consent was given by the patient. Immediately prior to procedure a time out was called to verify the correct patient, procedure, equipment, support staff and site/side marked as required. Patient was prepped and draped in the usual sterile fashion.      Clinical Data: No additional findings.  ROS:  All other systems negative, except as noted in the HPI. Review of Systems  Objective: Vital Signs: There were no vitals taken for this visit.  Specialty Comments:  Narrative & Impression EXAM: MRI LUMBAR SPINE 02/26/2024 04:22:00 PM   TECHNIQUE: Multiplanar multisequence MRI of the lumbar spine was performed without the administration of intravenous contrast.   COMPARISON: None available.   CLINICAL HISTORY: Low back pain, symptoms persist with > 6 wks treatment. Degeneration of intervertebral disc of lumbar region with discogenic back pain and lower extremity pain; No injury; No surgery; No cancer.   FINDINGS:   BONES AND ALIGNMENT: Grade 1 retrolisthesis at L1-L2 and L2-L3. Normal vertebral body heights. Bone marrow signal is unremarkable.   SPINAL CORD: The conus terminates normally.   SOFT TISSUES: No paraspinal mass.   T12-L1: Disc space narrowing and small bulge. No central spinal canal or neural foraminal stenosis.   L1-L2: Disc space narrowing with disc bulge and endplate spurring asymmetric to the left. Mild narrowing of the left lateral recess. No central spinal canal or neural foraminal stenosis.   L2-L3: Small disc bulge with endplate spurring. No central spinal canal or neural foraminal stenosis.   L3-L4: Right asymmetric disc bulge with endplate spurring. Mild right foraminal stenosis. No spinal canal stenosis.   L4-L5: Small disc bulge. No central spinal canal or neural foraminal stenosis.    L5-S1: Moderate facet hypertrophy, disc space narrowing, and small bulge. Severe bilateral neural foraminal stenosis. Narrowing of both lateral recesses without central spinal canal stenosis.   IMPRESSION: 1. L5-S1 moderate facet hypertrophy, disc space narrowing, and small bulge, resulting in severe bilateral neural foraminal stenosis and narrowing of both lateral recesses without central spinal canal stenosis. 2. L3-L4 right asymmetric disc bulge with endplate spurring, resulting in mild right foraminal stenosis. No spinal canal stenosis. 3. L1-L2 disc space narrowing with disc bulge and endplate spurring asymmetric to the left, resulting in mild narrowing of the left lateral recess. No central spinal canal or neural foraminal stenosis.   Electronically signed by: Franky Stanford MD 02/29/2024 03:02 AM EDT RP Workstation: [id number]  PMFS History: Patient Active Problem List   Diagnosis Date Noted   Paroxysmal atrial fibrillation (HCC) 06/05/2023   Asthma in adult 10/25/2022   Hypercoagulable state due to paroxysmal atrial fibrillation (  HCC) 11/16/2021   Atrial fibrillation with RVR (HCC) 11/10/2021   Atrial fibrillation with rapid ventricular response (HCC) 11/09/2021   Acute systolic heart failure (HCC) 11/01/2018   NSTEMI (non-ST elevated myocardial infarction) (HCC) 10/30/2018   Hyperlipidemia 06/13/2018   Lactose intolerance 06/13/2018   S/P total knee arthroplasty, right 03/16/2016   Unilateral primary osteoarthritis, right knee    Special screening for malignant neoplasms, colon 12/29/2014   Diarrhea 12/29/2014   Medication management 05/13/2013   Does not have health insurance 05/13/2013   Adjustment reaction with anxiety and depression 03/12/2012   Medication monitoring encounter 03/12/2012   Hemorrhoids 08/13/2011   Perineal itching, female 08/13/2011   LBBB (left bundle branch block) 09/24/2010   Murmur 09/08/2010   EKG, abnormal 08/19/2010   Visit for  preventive health examination 08/19/2010   Palpitations 08/18/2010   Dizziness 08/18/2010   Arthritis 08/18/2010   Anxiety state 06/18/2008   GRIEF REACTION 06/18/2008   DEPRESSION, MAJOR, RECURRENT 07/06/2006   ARTHRALGIA, UNSPECIFIED 07/06/2006   Past Medical History:  Diagnosis Date   Anxiety    Arthritis    R knee, back, hands    Cellulitis of knee 04/2016   RT KNEE   Cellulitis of right knee 04/18/2016   Childhood asthma    Complication of anesthesia    woke up slowly - 91yrs. ago   Depression    related to husband illness and death   History of jaundice    as a child     Hx of varicella    Hyperthyroidism    during pregnancy, treated & resolved post partum    IBS (irritable bowel syndrome)    LBBB (left bundle branch block)    Scoliosis    Scoliosis 07/2015   Thyroid  disease in pregnancy    TOBACCO DEPENDENCE 07/06/2006   Qualifier: Diagnosis of  By: Manford Longs     Troponin level elevated     Family History  Problem Relation Age of Onset   Alcohol abuse Mother        died from stomach ulcers   Stroke Father        died  from cva and mi   Hypertension Father    Hyperlipidemia Father    Coronary artery disease Father        Age 33   Colon polyps Sister    Hepatitis C Brother    Colon cancer Neg Hx     Past Surgical History:  Procedure Laterality Date   ATRIAL FIBRILLATION ABLATION N/A 08/11/2022   Procedure: ATRIAL FIBRILLATION ABLATION;  Surgeon: Inocencio Soyla Lunger, MD;  Location: [facility];  Service: Cardiovascular;  Laterality: N/A;   BREAST BIOPSY Right 1979   benign    Cyst removed from ovary     KNEE SURGERY     right Chena Chohan    LEFT HEART CATH AND CORONARY ANGIOGRAPHY N/A 10/31/2018   Procedure: LEFT HEART CATH AND CORONARY ANGIOGRAPHY;  Surgeon: Burnard Debby LABOR, MD;  Location: [facility];  Service: Cardiovascular;  Laterality: N/A;   TONSILLECTOMY AND ADENOIDECTOMY  1073   TOTAL KNEE ARTHROPLASTY 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   Procedure:  TOTAL KNEE ARTHROPLASTY;  Surgeon: Jerona LULLA Sage, MD;  Location: [facility];  Service: Orthopedics;  Laterality: Right;   TUBAL LIGATION     WISDOM TOOTH EXTRACTION     Social History   Occupational History   Occupation: self    Comment: Catering and cleaning  Tobacco Use   Smoking status: Former  Current packs/day: 0.00    Average packs/day: 0.3 packs/day for 40.0 years (10.0 ttl pk-yrs)    Types: Cigarettes    Start date: 01/06/1976    Quit date: 01/06/2016    Years since quitting: 8.1   Smokeless tobacco: Never   Tobacco comments:    Former smoker 11/16/21  Substance and Sexual Activity   Alcohol use: Yes    Alcohol/week: 7.0 standard drinks of alcohol    Types: 7 Glasses of wine per week    Comment: 1 glass of wine daily 11/16/21   Drug use: No   Sexual activity: Not on file

## 2024-03-06 NOTE — Therapy (Addendum)
 OUTPATIENT PHYSICAL THERAPY THORACOLUMBAR TREATMENT/DISCHARGE  PHYSICAL THERAPY DISCHARGE SUMMARY  Visits from Start of Care: 8  Current functional level related to goals / functional outcomes: See below    Remaining deficits: See below   Education / Equipment: HEP   Patient agrees to discharge. Patient goals were partially met. Patient is being discharged due to being pleased with the current functional level.    Patient Name: Valerie Love MRN: [id number] DOB:02/05/1956, 68 y.o., female Today's Date: 03/07/24  END OF SESSION:    03/07/24 1601 03/12/24 1557  PT Visits / Re-Eval  Visit Number 8  --   Number of Visits 17  --   Date for Recertification  03/22/24  --   PT Time Calculation  PT Start Time 0930  --   PT Stop Time 1008  --   PT Time Calculation (min) 38 min  --   PT - End of Session  Activity Tolerance Patient tolerated treatment well  --   Behavior During Therapy WFL for tasks assessed/performed  --            Past Medical History:  Diagnosis Date   Anxiety    Arthritis    R knee, back, hands    Cellulitis of knee 04/2016   RT KNEE   Cellulitis of right knee 04/18/2016   Childhood asthma    Complication of anesthesia    woke up slowly - 66yrs. ago   Depression    related to husband illness and death   History of jaundice    as a child     Hx of varicella    Hyperthyroidism    during pregnancy, treated & resolved post partum    IBS (irritable bowel syndrome)    LBBB (left bundle branch block)    Scoliosis    Scoliosis 07/2015   Thyroid  disease in pregnancy    TOBACCO DEPENDENCE 07/06/2006   Qualifier: Diagnosis of  By: Manford Longs     Troponin level elevated    Past Surgical History:  Procedure Laterality Date   ATRIAL FIBRILLATION ABLATION N/A 08/11/2022   Procedure: ATRIAL FIBRILLATION ABLATION;  Surgeon: Inocencio Soyla Lunger, MD;  Location: [facility];  Service: Cardiovascular;  Laterality: N/A;   BREAST BIOPSY Right  1979   benign    Cyst removed from ovary     KNEE SURGERY     right duda    LEFT HEART CATH AND CORONARY ANGIOGRAPHY N/A 10/31/2018   Procedure: LEFT HEART CATH AND CORONARY ANGIOGRAPHY;  Surgeon: Burnard Debby LABOR, MD;  Location: [facility];  Service: Cardiovascular;  Laterality: N/A;   TONSILLECTOMY AND ADENOIDECTOMY  1073   TOTAL KNEE ARTHROPLASTY 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   Procedure: TOTAL KNEE ARTHROPLASTY;  Surgeon: Jerona LULLA Sage, MD;  Location: [facility];  Service: Orthopedics;  Laterality: Right;   TUBAL LIGATION     WISDOM TOOTH EXTRACTION     Patient Active Problem List   Diagnosis Date Noted   Paroxysmal atrial fibrillation (HCC) 06/05/2023   Asthma in adult 10/25/2022   Hypercoagulable state due to paroxysmal atrial fibrillation (HCC) 11/16/2021   Atrial fibrillation with RVR (HCC) 11/10/2021   Atrial fibrillation with rapid ventricular response (HCC) 11/09/2021   Acute systolic heart failure (HCC) 11/01/2018   NSTEMI (non-ST elevated myocardial infarction) (HCC) 10/30/2018   Hyperlipidemia 06/13/2018   Lactose intolerance 06/13/2018   S/P total knee arthroplasty, right 03/16/2016   Unilateral primary osteoarthritis, right knee    Special screening for  malignant neoplasms, colon 12/29/2014   Diarrhea 12/29/2014   Medication management 05/13/2013   Does not have health insurance 05/13/2013   Adjustment reaction with anxiety and depression 03/12/2012   Medication monitoring encounter 03/12/2012   Hemorrhoids 08/13/2011   Perineal itching, female 08/13/2011   LBBB (left bundle branch block) 09/24/2010   Murmur 09/08/2010   EKG, abnormal 08/19/2010   Visit for preventive health examination 08/19/2010   Palpitations 08/18/2010   Dizziness 08/18/2010   Arthritis 08/18/2010   Anxiety state 06/18/2008   GRIEF REACTION 06/18/2008   DEPRESSION, MAJOR, RECURRENT 07/06/2006   ARTHRALGIA, UNSPECIFIED 07/06/2006    PCP: Ozell Heron HERO, MD   REFERRING PROVIDER: Ozell Heron HERO, MD   REFERRING DIAG:  Diagnosis  [phone] (ICD-10-CM) - Degeneration of intervertebral disc of lumbar region with discogenic back pain and lower extremity pain    Rationale for Evaluation and Treatment: Rehabilitation  THERAPY DIAG:  Search for diagnosis Add Common Problems    Previous: Abnormal posture Other low back pain Other muscle spasm Muscle weakness (generalized) Radiculopathy, lumbar region Urinary urgency Unspecified lack of coordination Female genital prolapse, unspecified type                  P                     1.   Abnormal posture       ICD-10-CM: R29.3      2.   Other low back pain       ICD-10-CM: M54.59      3.   Other muscle spasm       ICD-10-CM: M62.838      4.   Muscle weakness (generalized)       ICD-10-CM: M62.81      5.   Radiculopathy, lumbar region       ICD-10-CM: M54.16         ONSET DATE: 7/25  SUBJECTIVE:                                                                                                                                                                                           SUBJECTIVE STATEMENT: Pt reports feeling good this morning. PERTINENT HISTORY:  No spinal surgeries  PAIN:  Are you having pain? Yes: NPRS scale: July 8/10 today 2/10 Pain location: L LE posterior/lateral and L LBP Pain description: tingling, numbness, sharp Aggravating factors: walking, yardwork, prolonged standing/sitting Relieving factors: medication, heat  PRECAUTIONS: None  RED FLAGS: None   WEIGHT BEARING RESTRICTIONS: No  FALLS:  Has patient fallen in last 6 months? No  LIVING ENVIRONMENT:  Lives with: lives with their family and lives alone Lives in: House/apartment   OCCUPATION: financial trader for [employer]  PLOF: Independent  PATIENT GOALS: walking without pain  NEXT MD VISIT: unknown  OBJECTIVE:  Note: Objective measures were completed at Evaluation unless otherwise noted.  DIAGNOSTIC  FINDINGS:  12/12/23  2 view radiographs of the lumbar spine shows a degenerative scoliosis with  complete collapse of the disc space of the lumbar spine with periarticular  bony spurs.   PATIENT SURVEYS:  PSFS: THE PATIENT SPECIFIC FUNCTIONAL SCALE  Place score of 0-10 (0 = unable to perform activity and 10 = able to perform activity at the same level as before injury or problem)  Activity Date: 01/26/24 Date: 03/07/24   Walking for 20 min 5 8   2.standing for 30 min 4 10   3.yardwork for 30 min 5 7   4.      Total Score 4.66 8.33     Total Score = Sum of activity scores/number of activities  Minimally Detectable Change: 3 points (for single activity); 2 points (for average score)  [facility] (nd). The Patient Specific Functional Scale . Retrieved from [email or web address]   COGNITION: Overall cognitive status: Within functional limits for tasks assessed     SENSATION: WFL  MUSCLE LENGTH: Hamstrings: Right/Left mod restriction deg   POSTURE: scap winging L , L shoulder higher, flattened T/L spine,dec axilla space R, L genu valgum   PALPATION: 1+ tenderness at L GM, TFL and pvm  LUMBAR ROM:   AROM eval 03/07/24  Flexion 75% WNL  Extension WNL WNL  Right lateral flexion 50% 75%  Left lateral flexion 50% 50%  Right rotation 75% WNL  Left rotation WNL WNL   (Blank rows = not tested)  LOWER EXTREMITY ROM:   WFL throughout  A/P ROM Right eval Left eval  Hip flexion    Hip extension    Hip abduction    Hip adduction    Hip internal rotation    Hip external rotation    Knee flexion    Knee extension    Ankle dorsiflexion    Ankle plantarflexion    Ankle inversion    Ankle eversion     (Blank rows = not tested)  LOWER EXTREMITY MMT:    MMT Right eval Left eval  Hip flexion  4  Hip extension    Hip abduction  4  Hip adduction    Hip internal rotation    Hip external rotation    Knee  flexion    Knee extension    Ankle dorsiflexion 5- 5-  Ankle plantarflexion    Ankle inversion    Ankle eversion     (Blank rows = not tested)  LUMBAR SPECIAL TESTS:  SLR L negative  FUNCTIONAL TESTS:  5 times sit to stand: 13 sec no UE use  GAIT: Distance walked: 20 min community Assistive device utilized: None Level of assistance: Complete Independence   TREATMENT DATE: Lumbar 03/07/24 There Ex for ROM/strengthening  Rec bike level 2 8 min SKTC  25 sec 3x each side Lumbar rotations 2x20 Neuro Re ed for posture/stabilization PPT 3x10 Bridges 3x10 with T band G Tband green hip abd 3x10 Manual Percussive device to lumbar pvm    03/05/24 There Ex for ROM/strengthening  Rec bike level 2 8 min SKTC  25 sec 3x each side Lumbar rotations 2x20 Neuro Re ed for posture/stabilization PPT 3x10 Bridges 3x10 with T band G Tband green hip abd  3x10 Manual Percussive device to lumbar pvm    02/29/24 There Ex for ROM/strengthening  Nustep level 5 8 min SKTC  25 sec 3x each side Lumbar rotations 2x20 Neuro Re ed for posture/stabilization PPT 2x10 Bridges 3x10 with T band G Tband green hip abd 3x10 Manual Percussive device to lumbar pvm    02/23/24 There Ex for ROM/strengthening  Nustep level 5 8 min SKTC  25 sec 3x each side Lumbar rotations 2x20 Neuro Re ed for posture/stabilization PPT 2x10 Bridges 3x10 with T band G Tband green hip abd 3x10 Manual Percussive device to lumbar pvm    02/20/24 There Ex for ROM/strengthening  Nustep level 5 8 min Leg press 75# 3x10 SKTC  25 sec 3x each side Lumbar rotations 2x20 Neuro Re ed for posture/stabilization PPT 2x10 Bridges 3x10 with T band G Tband green hip abd 3x10 Manual Percussive device to lumbar pvm   01/26/24 Initial evaluation completed followed by trial set of HEP.                                                                                                                                   PATIENT EDUCATION:  Education details: HEP Person educated: Patient Education method: Explanation, Demonstration, Actor cues, and Verbal cues Education comprehension: verbalized understanding, returned demonstration, and verbal cues required  HOME EXERCISE PROGRAM: Access Code: [email or web address] URL: [email or web address] Date: 01/26/2024 Prepared by: Burnard Meth  Exercises - Supine Posterior Pelvic Tilt  - 2 x daily - 7 x weekly - 2 sets - 10 reps - 3 hold - Supine Bridge  - 2 x daily - 7 x weekly - 2 sets - 10 reps - 1 hold - Lower Trunk Rotations  - 2 x daily - 7 x weekly - 2 sets - 10 reps - 2 hold - Supine Double Knee to Chest  - 2 x daily - 7 x weekly - 1 sets - 4 reps - 25 hold  ASSESSMENT:  CLINICAL IMPRESSION: Pt has completed STG and most LTG.  She is prepared to continue advancing at home with use of HEP.  OBJECTIVE IMPAIRMENTS: decreased balance, decreased endurance, decreased mobility, difficulty walking, decreased ROM, decreased strength, impaired flexibility, and pain.   ACTIVITY LIMITATIONS: bending, sitting, standing, squatting, transfers, and caring for others  PARTICIPATION LIMITATIONS: meal prep, community activity, and yard work  PERSONAL FACTORS: Age are also affecting patient's functional outcome.   REHAB POTENTIAL: Good  CLINICAL DECISION MAKING: Stable/uncomplicated  EVALUATION COMPLEXITY: Moderate   GOALS: Goals reviewed with patient? Yes  SHORT TERM GOALS: Target date: 02/23/2024  MET    Pt to be independent with HEP. Baseline: Goal status:MET 10/14  2.  Decrease level of pain by 1 grade. Baseline:  Goal status: MET 10/14  LONG TERM GOALS: Target date: 03/22/2024    Pt to be independent with self progressive HEP by discharge. Baseline:  Goal status: MET 03/07/24  2.  Decrease pain  to 2/10 max with all activities. Baseline:  Goal status: Partially MET  3.  Increase AROM by 25%. Baseline:  Goal status:MET  03/07/24  4.  Increase strength by 1/2 to 1 full grade where affected. Baseline:  Goal status: MET 03/07/24  5.  Improve PSFS by at least 2 points for measurable difference. Baseline:  Goal status: MET  10/30  6.  Able to stand, ambulate or do yardwork for at least 45 minutes with minimal to no pain. Baseline: 20 min Goal status: Partially MET, at 30 min  PLAN:  PT FREQUENCY: 2x/week  PT DURATION: 8 weeks  PLANNED INTERVENTIONS: [id number]- PT Re-evaluation, 97110-Therapeutic exercises, 97530- Therapeutic activity, 97112- Neuromuscular re-education, 97535- Self Care, [id number]- Manual therapy, [phone]- Gait training, [phone]- Aquatic Therapy, [phone]- Electrical stimulation (unattended), Patient/Family education, Balance training, Joint mobilization, Spinal mobilization, Cryotherapy, and Moist heat.  PLAN FOR NEXT SESSION: DC to HEP.  Burnard CHRISTELLA Meth, PT 03/06/2024, 8:47 AM

## 2024-03-07 ENCOUNTER — Ambulatory Visit

## 2024-03-07 DIAGNOSIS — R293 Abnormal posture: Secondary | ICD-10-CM | POA: Diagnosis not present

## 2024-03-07 DIAGNOSIS — M6281 Muscle weakness (generalized): Secondary | ICD-10-CM

## 2024-03-07 DIAGNOSIS — M5416 Radiculopathy, lumbar region: Secondary | ICD-10-CM

## 2024-03-07 DIAGNOSIS — M5459 Other low back pain: Secondary | ICD-10-CM

## 2024-03-07 DIAGNOSIS — M62838 Other muscle spasm: Secondary | ICD-10-CM | POA: Diagnosis not present

## 2024-03-11 ENCOUNTER — Encounter: Payer: Self-pay | Admitting: Radiology

## 2024-03-27 ENCOUNTER — Telehealth (INDEPENDENT_AMBULATORY_CARE_PROVIDER_SITE_OTHER): Admitting: Adult Health

## 2024-03-27 ENCOUNTER — Ambulatory Visit: Payer: Self-pay

## 2024-03-27 DIAGNOSIS — J0110 Acute frontal sinusitis, unspecified: Secondary | ICD-10-CM | POA: Diagnosis not present

## 2024-03-27 MED ORDER — AMOXICILLIN-POT CLAVULANATE 875-125 MG PO TABS: 1.0000 | ORAL_TABLET | Freq: Two times a day (BID) | ORAL | 0 refills | Status: AC

## 2024-03-27 NOTE — Telephone Encounter (Signed)
 FYI Only or Action Required?: FYI only for provider: appointment scheduled on 03/27/24.  Patient was last seen in primary care on 10/05/2023 by Ozell Heron HERO, MD.  Called Nurse Triage reporting Facial Pain and Cough.  Symptoms began several days ago.  Interventions attempted: OTC medications: tylenol , flonase and Prescription medications: albuterol  inhaler.  Symptoms are: gradually worsening.  Triage Disposition: See HCP Within 4 Hours (Or PCP Triage)  Patient/caregiver understands and will follow disposition?: Yes  Copied from CRM [id number]. Topic: Clinical - Red Word Triage >> Mar 27, 2024 10:41 AM Rosina BIRCH wrote: Reason for RMF:[id number] called stating she has a sinus infection coming home from a cruise. Patient is coughing up yellow phlegm, has a headache and has pressure in her eyes and forehead Reason for Disposition  [1] SEVERE sinus pain (e.g., excruciating) AND [2] not improved 2 hours after pain medicine  Answer Assessment - Initial Assessment Questions Pt reports onset of cough with green yellow mucus, headache, and sinus congestion after getting off a cruise. Has hx of sinus infections in the past that required abxs. Scheduled virtual appt with different provider at home office today d/t no PCP availability within timeframe. Advised UC or ED for worsening symptoms.   1. LOCATION: Where does it hurt?      Head and sinuses  2. ONSET: When did the sinus pain start?  (e.g., hours, days)      Saturday  3. SEVERITY: How bad is the pain?   (Scale 0-10; or none, mild, moderate or severe)     8/10   4. RECURRENT SYMPTOM: Have you ever had sinus problems before? If Yes, ask: When was the last time? and What happened that time?      Yes, had to take abxs  5. NASAL CONGESTION: Is the nose blocked? If Yes, ask: Can you open it or must you breathe through your mouth?     Denies  6. NASAL DISCHARGE: Do you have discharge from your nose? If so ask, What  color?     Thick yellow green mucus  7. FEVER: Do you have a fever? If Yes, ask: What is it, how was it measured, and when did it start?      Pt unsure, thermometer not working. Reports chills last  8. OTHER SYMPTOMS: Do you have any other symptoms? (e.g., sore throat, cough, earache, difficulty breathing)     Cough with yellow green phlegm  Protocols used: Sinus Pain or Congestion-A-AH

## 2024-03-27 NOTE — Progress Notes (Signed)
 Virtual Visit via Video Note  I connected with Valerie Love  on 03/27/24 at  1:00 PM EST by a video enabled telemedicine application and verified that I am speaking with the correct person using two identifiers.  Location patient: home Location provider:work or home office Persons participating in the virtual visit: patient, provider  I discussed the limitations of evaluation and management by telemedicine and the availability of in person appointments. The patient expressed understanding and agreed to proceed.   HPI:  Discussed the use of AI scribe software for clinical note transcription with the patient, who gave verbal consent to proceed.  History of Present Illness   Valerie Love is a 68 year old female who presents with symptoms suggestive of a sinus infection.  She experiences nasal congestion sinus pressure/pain, a productive cough with thick mucus, and a headache localized to her forehead and under her eyes for over a week.  Chills occurred last night, but she is unsure about fever due to a malfunctioning thermometer. She uses her inhaler and nasal spray with some relief but avoids over-the-counter medications due to potential interactions with blood thinners and afib.       ROS: See pertinent positives and negatives per HPI.  Past Medical History:  Diagnosis Date   Anxiety    Arthritis    R knee, back, hands    Cellulitis of knee 04/2016   RT KNEE   Cellulitis of right knee 04/18/2016   Childhood asthma    Complication of anesthesia    woke up slowly - 106yrs. ago   Depression    related to husband illness and death   History of jaundice    as a child     Hx of varicella    Hyperthyroidism    during pregnancy, treated & resolved post partum    IBS (irritable bowel syndrome)    LBBB (left bundle branch block)    Scoliosis    Scoliosis 07/2015   Thyroid  disease in pregnancy    TOBACCO DEPENDENCE 07/06/2006   Qualifier: Diagnosis of  By: Manford Longs      Troponin level elevated     Past Surgical History:  Procedure Laterality Date   ATRIAL FIBRILLATION ABLATION N/A 08/11/2022   Procedure: ATRIAL FIBRILLATION ABLATION;  Surgeon: Inocencio Soyla Lunger, MD;  Location: [facility];  Service: Cardiovascular;  Laterality: N/A;   BREAST BIOPSY Right 1979   benign    Cyst removed from ovary     KNEE SURGERY     right duda    LEFT HEART CATH AND CORONARY ANGIOGRAPHY N/A 10/31/2018   Procedure: LEFT HEART CATH AND CORONARY ANGIOGRAPHY;  Surgeon: Burnard Debby LABOR, MD;  Location: [facility];  Service: Cardiovascular;  Laterality: N/A;   TONSILLECTOMY AND ADENOIDECTOMY  1073   TOTAL KNEE ARTHROPLASTY 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   Procedure: TOTAL KNEE ARTHROPLASTY;  Surgeon: Jerona LULLA Sage, MD;  Location: [facility];  Service: Orthopedics;  Laterality: Right;   TUBAL LIGATION     WISDOM TOOTH EXTRACTION      Family History  Problem Relation Age of Onset   Alcohol abuse Mother        died from stomach ulcers   Stroke Father        died  from cva and mi   Hypertension Father    Hyperlipidemia Father    Coronary artery disease Father        Age 33   Colon polyps Sister  Hepatitis C Brother    Colon cancer Neg Hx        Current Outpatient Medications:    acetaminophen  (TYLENOL ) 650 MG CR tablet, Take 1,000 mg by mouth 2 (two) times daily., Disp: , Rfl:    albuterol  (VENTOLIN  HFA) 108 (90 Base) MCG/ACT inhaler, INHALE 2 PUFFS BY MOUTH EVERY 6 HOURS AS NEEDED FOR WHEEZING FOR SHORTNESS OF BREATH, Disp: 18 g, Rfl: 5   ALPRAZolam  (XANAX ) 0.5 MG tablet, TAKE 1 TABLET BY MOUTH THREE TIMES A DAY AS NEEDED FOR ANXIETY. MAXIMUM 3 TALBETS PER 24 HOURS, Disp: 30 tablet, Rfl: 0   amoxicillin -clavulanate (AUGMENTIN ) 875-125 MG tablet, Take 1 tablet by mouth 2 (two) times daily., Disp: 14 tablet, Rfl: 0   apixaban  (ELIQUIS ) 5 MG TABS tablet, Take 1 tablet (5 mg total) by mouth 2 (two) times daily., Disp: 180 tablet, Rfl: 3   Calcium  Carb-Cholecalciferol  (CALCIUM +D3) 600-800 MG-UNIT TABS, Take 1 tablet by mouth daily with breakfast., Disp: , Rfl:    citalopram  (CELEXA ) 40 MG tablet, Take 1 tablet (40 mg total) by mouth daily., Disp: 90 tablet, Rfl: 1   diltiazem  (CARDIZEM  CD) 180 MG 24 hr capsule, Take 1 capsule (180 mg total) by mouth daily., Disp: 30 capsule, Rfl: 11   diltiazem  (CARDIZEM ) 30 MG tablet, Take one tablet by mouth every 4 hours as needed for HR greater than 100, top number B/P needs to be above 100, Disp: 45 tablet, Rfl: 3   diphenhydrAMINE  (BENADRYL ) 25 MG tablet, Take 25 mg by mouth at bedtime as needed for sleep or itching., Disp: , Rfl:    methocarbamol  (ROBAXIN ) 500 MG tablet, Take 1 tablet (500 mg total) by mouth every 8 (eight) hours as needed for muscle spasms., Disp: 30 tablet, Rfl: 0   Multiple Vitamins-Minerals (ALIVE ONCE DAILY WOMENS 50+) TABS, Take 1 tablet by mouth daily., Disp: , Rfl:    predniSONE  (DELTASONE ) 10 MG tablet, Take 1 tablet (10 mg total) by mouth daily with breakfast., Disp: 30 tablet, Rfl: 0   predniSONE  (DELTASONE ) 10 MG tablet, Take 1 tablet (10 mg total) by mouth daily with breakfast., Disp: 30 tablet, Rfl: 0   Probiotic Product (TRUBIOTICS) CAPS, Take 1 capsule by mouth daily., Disp: , Rfl:    rosuvastatin  (CRESTOR ) 5 MG tablet, Take 1 tablet by mouth once daily, Disp: 90 tablet, Rfl: 3  EXAM:  VITALS per patient if applicable:  GENERAL: alert, oriented, appears acutely ill  but in no  acute distress  HEENT: atraumatic, conjunttiva clear, no obvious abnormalities on inspection of external nose and ears  NECK: normal movements of the head and neck  LUNGS: on inspection no signs of respiratory distress, breathing rate appears normal, no obvious gross SOB, gasping or wheezing  CV: no obvious cyanosis  MS: moves all visible extremities without noticeable abnormality  PSYCH/NEURO: pleasant and cooperative, no obvious depression or anxiety, speech and thought processing grossly  intact  ASSESSMENT AND PLAN:  Discussed the following assessment and plan:  1. Acute non-recurrent frontal sinusitis (Primary) - will treat for suspected bacterial sinus infection with Augmentin .  - Stay hydrated and rest.  - Follow up if not improving in the next 3-4 days      I discussed the assessment and treatment plan with the patient. The patient was provided an opportunity to ask questions and all were answered. The patient agreed with the plan and demonstrated an understanding of the instructions.   The patient was advised to call back or seek  an in-person evaluation if the symptoms worsen or if the condition fails to improve as anticipated.   Valerie Marquina, NP

## 2024-03-28 ENCOUNTER — Other Ambulatory Visit: Payer: Self-pay

## 2024-03-28 ENCOUNTER — Ambulatory Visit: Admitting: Physical Medicine and Rehabilitation

## 2024-03-28 VITALS — BP 137/83 | HR 71

## 2024-03-28 DIAGNOSIS — M5416 Radiculopathy, lumbar region: Secondary | ICD-10-CM | POA: Diagnosis not present

## 2024-03-28 MED ORDER — METHYLPREDNISOLONE ACETATE 40 MG/ML IJ SUSP
40.0000 mg | Freq: Once | INTRAMUSCULAR | Status: AC
  Administered 2024-03-28: 40 mg

## 2024-03-28 NOTE — Progress Notes (Signed)
 Pain Scale   Average Pain 5 Patient advising she has chronic lower back pain that does not radiate pain increases when walking and decreases when resting.        +Driver, -BT, -Dye Allergies.

## 2024-04-01 NOTE — Progress Notes (Signed)
 Valerie Love - 68 y.o. female MRN [id number]  Date of birth: 09/06/1955  Office Visit Note: Visit Date: 03/28/2024 PCP: Ozell Heron HERO, MD Referred by: Ozell Heron HERO, MD  Subjective: Chief Complaint  Patient presents with   Lower Back - Pain   HPI:  Valerie Love is a 68 y.o. female who comes in today at the request of Dr. Jerona Sage for planned Bilateral S1-2 Lumbar Transforaminal epidural steroid injection with fluoroscopic guidance.  The patient has failed conservative care including home exercise, medications, time and activity modification.  This injection will be diagnostic and hopefully therapeutic.  Please see requesting physician notes for further details and justification.  ** consider facets.   ROS Otherwise per HPI.  Assessment & Plan: Visit Diagnoses:    ICD-10-CM   1. Lumbar radiculopathy  M54.16 XR C-ARM NO REPORT    Epidural Steroid injection    methylPREDNISolone  acetate (DEPO-MEDROL ) injection 40 mg      Plan: No additional findings.   Meds & Orders:  Meds ordered this encounter  Medications   methylPREDNISolone  acetate (DEPO-MEDROL ) injection 40 mg    Orders Placed This Encounter  Procedures   XR C-ARM NO REPORT   Epidural Steroid injection    Follow-up: Return for visit to requesting provider as needed.   Procedures: No procedures performed  S1 Lumbosacral Transforaminal Epidural Steroid Injection - Sub-Pedicular Approach with Fluoroscopic Guidance   Patient: Valerie Love      Date of Birth: 10-30-1955 MRN: [id number] PCP: Ozell Heron HERO, MD      Visit Date: 03/28/2024   Universal Protocol:    Date/Time: 11/24/256:40 AM  Consent Given By: the patient  Position:  PRONE  Additional Comments: Vital signs were monitored before and after the procedure. Patient was prepped and draped in the usual sterile fashion. The correct patient, procedure, and site was verified.   Injection Procedure Details:  Procedure  Site One Meds Administered:  Meds ordered this encounter  Medications   methylPREDNISolone  acetate (DEPO-MEDROL ) injection 40 mg    Laterality: Bilateral  Location/Site:  S1 Foramen   Needle size: 22 ga.  Needle type: Spinal  Needle Placement: Transforaminal  Findings:   -Comments: Excellent flow of contrast along the nerve, nerve root and into the epidural space.  Epidurogram: Contrast epidurogram showed no nerve root cut off or restricted flow pattern.  Procedure Details: After squaring off the sacral end-plate to get a true AP view, the C-arm was positioned so that the best possible view of the S1 foramen was visualized. The soft tissues overlying this structure were infiltrated with 2-3 ml. of 1% Lidocaine  without Epinephrine.    The spinal needle was inserted toward the target using a trajectory view along the fluoroscope beam.  Under AP and lateral visualization, the needle was advanced so it did not puncture dura. Biplanar projections were used to confirm position. Aspiration was confirmed to be negative for CSF and/or blood. A 1-2 ml. volume of Isovue-250 was injected and flow of contrast was noted at each level. Radiographs were obtained for documentation purposes.   After attaining the desired flow of contrast documented above, a 0.5 to 1.0 ml test dose of 0.25% Marcaine  was injected into each respective transforaminal space.  The patient was observed for 90 seconds post injection.  After no sensory deficits were reported, and normal lower extremity motor function was noted,   the above injectate was administered so that equal amounts of the injectate were placed at each  foramen (level) into the transforaminal epidural space.   Additional Comments:  The patient tolerated the procedure well Dressing: Band-Aid with 2 x 2 sterile gauze    Post-procedure details: Patient was observed during the procedure. Post-procedure instructions were reviewed.  Patient left the  clinic in stable condition.   Clinical History: Narrative & Impression EXAM: MRI LUMBAR SPINE 02/26/2024 04:22:00 PM   TECHNIQUE: Multiplanar multisequence MRI of the lumbar spine was performed without the administration of intravenous contrast.   COMPARISON: None available.   CLINICAL HISTORY: Low back pain, symptoms persist with > 6 wks treatment. Degeneration of intervertebral disc of lumbar region with discogenic back pain and lower extremity pain; No injury; No surgery; No cancer.   FINDINGS:   BONES AND ALIGNMENT: Grade 1 retrolisthesis at L1-L2 and L2-L3. Normal vertebral body heights. Bone marrow signal is unremarkable.   SPINAL CORD: The conus terminates normally.   SOFT TISSUES: No paraspinal mass.   T12-L1: Disc space narrowing and small bulge. No central spinal canal or neural foraminal stenosis.   L1-L2: Disc space narrowing with disc bulge and endplate spurring asymmetric to the left. Mild narrowing of the left lateral recess. No central spinal canal or neural foraminal stenosis.   L2-L3: Small disc bulge with endplate spurring. No central spinal canal or neural foraminal stenosis.   L3-L4: Right asymmetric disc bulge with endplate spurring. Mild right foraminal stenosis. No spinal canal stenosis.   L4-L5: Small disc bulge. No central spinal canal or neural foraminal stenosis.   L5-S1: Moderate facet hypertrophy, disc space narrowing, and small bulge. Severe bilateral neural foraminal stenosis. Narrowing of both lateral recesses without central spinal canal stenosis.   IMPRESSION: 1. L5-S1 moderate facet hypertrophy, disc space narrowing, and small bulge, resulting in severe bilateral neural foraminal stenosis and narrowing of both lateral recesses without central spinal canal stenosis. 2. L3-L4 right asymmetric disc bulge with endplate spurring, resulting in mild right foraminal stenosis. No spinal canal stenosis. 3. L1-L2 disc space  narrowing with disc bulge and endplate spurring asymmetric to the left, resulting in mild narrowing of the left lateral recess. No central spinal canal or neural foraminal stenosis.   Electronically signed by: Franky Stanford MD 02/29/2024 03:02 AM EDT RP Workstation: [id number]     Objective:  VS:  HT:    WT:   BMI:     BP:137/83  HR:71bpm  TEMP: ( )  RESP:  Physical Exam Vitals and nursing note reviewed.  Constitutional:      General: She is not in acute distress.    Appearance: Normal appearance. She is not ill-appearing.  HENT:     Head: Normocephalic and atraumatic.     Right Ear: External ear normal.     Left Ear: External ear normal.  Eyes:     Extraocular Movements: Extraocular movements intact.  Cardiovascular:     Rate and Rhythm: Normal rate.     Pulses: Normal pulses.  Pulmonary:     Effort: Pulmonary effort is normal. No respiratory distress.  Abdominal:     General: There is no distension.     Palpations: Abdomen is soft.  Musculoskeletal:        General: Tenderness present.     Cervical back: Neck supple.     Right lower leg: No edema.     Left lower leg: No edema.     Comments: Patient has good distal strength with no pain over the greater trochanters.  No clonus or focal weakness.  Skin:  Findings: No erythema, lesion or rash.  Neurological:     General: No focal deficit present.     Mental Status: She is alert and oriented to person, place, and time.     Sensory: No sensory deficit.     Motor: No weakness or abnormal muscle tone.     Coordination: Coordination normal.  Psychiatric:        Mood and Affect: Mood normal.        Behavior: Behavior normal.      Imaging: No results found.

## 2024-04-01 NOTE — Procedures (Signed)
 S1 Lumbosacral Transforaminal Epidural Steroid Injection - Sub-Pedicular Approach with Fluoroscopic Guidance   Patient: Valerie Love      Date of Birth: 08-28-1955 MRN: [id number] PCP: Ozell Heron HERO, MD      Visit Date: 03/28/2024   Universal Protocol:    Date/Time: 11/24/256:40 AM  Consent Given By: the patient  Position:  PRONE  Additional Comments: Vital signs were monitored before and after the procedure. Patient was prepped and draped in the usual sterile fashion. The correct patient, procedure, and site was verified.   Injection Procedure Details:  Procedure Site One Meds Administered:  Meds ordered this encounter  Medications   methylPREDNISolone  acetate (DEPO-MEDROL ) injection 40 mg    Laterality: Bilateral  Location/Site:  S1 Foramen   Needle size: 22 ga.  Needle type: Spinal  Needle Placement: Transforaminal  Findings:   -Comments: Excellent flow of contrast along the nerve, nerve root and into the epidural space.  Epidurogram: Contrast epidurogram showed no nerve root cut off or restricted flow pattern.  Procedure Details: After squaring off the sacral end-plate to get a true AP view, the C-arm was positioned so that the best possible view of the S1 foramen was visualized. The soft tissues overlying this structure were infiltrated with 2-3 ml. of 1% Lidocaine  without Epinephrine.    The spinal needle was inserted toward the target using a trajectory view along the fluoroscope beam.  Under AP and lateral visualization, the needle was advanced so it did not puncture dura. Biplanar projections were used to confirm position. Aspiration was confirmed to be negative for CSF and/or blood. A 1-2 ml. volume of Isovue-250 was injected and flow of contrast was noted at each level. Radiographs were obtained for documentation purposes.   After attaining the desired flow of contrast documented above, a 0.5 to 1.0 ml test dose of 0.25% Marcaine  was injected  into each respective transforaminal space.  The patient was observed for 90 seconds post injection.  After no sensory deficits were reported, and normal lower extremity motor function was noted,   the above injectate was administered so that equal amounts of the injectate were placed at each foramen (level) into the transforaminal epidural space.   Additional Comments:  The patient tolerated the procedure well Dressing: Band-Aid with 2 x 2 sterile gauze    Post-procedure details: Patient was observed during the procedure. Post-procedure instructions were reviewed.  Patient left the clinic in stable condition.

## 2024-04-05 ENCOUNTER — Other Ambulatory Visit: Payer: Self-pay | Admitting: Family Medicine

## 2024-04-05 DIAGNOSIS — F411 Generalized anxiety disorder: Secondary | ICD-10-CM

## 2024-04-08 ENCOUNTER — Other Ambulatory Visit: Payer: Self-pay | Admitting: Pulmonary Disease

## 2024-04-08 ENCOUNTER — Other Ambulatory Visit: Payer: Self-pay | Admitting: Family Medicine

## 2024-04-08 DIAGNOSIS — F411 Generalized anxiety disorder: Secondary | ICD-10-CM

## 2024-04-10 ENCOUNTER — Telehealth: Payer: Self-pay | Admitting: Cardiology

## 2024-04-10 DIAGNOSIS — I480 Paroxysmal atrial fibrillation: Secondary | ICD-10-CM

## 2024-04-10 NOTE — Telephone Encounter (Signed)
*  STAT* If patient is at the pharmacy, call can be transferred to refill team.   1. Which medications need to be refilled? (please list name of each medication and dose if known) apixaban  (ELIQUIS ) 5 MG TABS tablet    2. Would you like to learn more about the convenience, safety, & potential cost savings by using the [facility]? no   3. Are you open to using the [facility] (Type [facility].  ). No    4. Which pharmacy/location (including street and city if local pharmacy) is medication to be sent to? [facility] [address] [place], [place] - [address]     5. Do they need a 30 day or 90 day supply? 90 day   Pt is out of medication and currently at the pharmacy

## 2024-04-12 ENCOUNTER — Telehealth: Payer: Self-pay | Admitting: Physical Medicine and Rehabilitation

## 2024-04-12 ENCOUNTER — Other Ambulatory Visit: Payer: Self-pay | Admitting: Pulmonary Disease

## 2024-04-12 ENCOUNTER — Other Ambulatory Visit (HOSPITAL_BASED_OUTPATIENT_CLINIC_OR_DEPARTMENT_OTHER): Payer: Self-pay

## 2024-04-12 DIAGNOSIS — I480 Paroxysmal atrial fibrillation: Secondary | ICD-10-CM

## 2024-04-12 MED ORDER — APIXABAN 5 MG PO TABS
5.0000 mg | ORAL_TABLET | Freq: Two times a day (BID) | ORAL | 1 refills | Status: AC
  Filled 2024-04-12: qty 180, 90d supply, fill #0
  Filled 2024-04-12 (×2): qty 90, 90d supply, fill #0

## 2024-04-12 MED ORDER — APIXABAN 5 MG PO TABS: 5.0000 mg | ORAL_TABLET | Freq: Two times a day (BID) | ORAL | 1 refills | Status: AC

## 2024-04-12 NOTE — Telephone Encounter (Signed)
 Pt called to let you know that she is doing alright, but she is still having pain. Call back number is [phone]

## 2024-04-12 NOTE — Telephone Encounter (Signed)
 Prescription refill request for Eliquis  received. Indication: a fib Last office visit: 10/03/23 Scr:  0.91 epic 10/03/23 Age: 68 Weight: 66kg

## 2024-04-15 ENCOUNTER — Other Ambulatory Visit (HOSPITAL_COMMUNITY): Payer: Self-pay

## 2024-04-16 ENCOUNTER — Other Ambulatory Visit: Payer: Self-pay

## 2024-04-22 ENCOUNTER — Other Ambulatory Visit: Payer: Self-pay | Admitting: Pulmonary Disease

## 2024-04-24 ENCOUNTER — Ambulatory Visit: Admitting: Physical Medicine and Rehabilitation

## 2024-04-24 ENCOUNTER — Encounter: Payer: Self-pay | Admitting: Physical Medicine and Rehabilitation

## 2024-04-24 DIAGNOSIS — M5442 Lumbago with sciatica, left side: Secondary | ICD-10-CM | POA: Diagnosis not present

## 2024-04-24 DIAGNOSIS — G8929 Other chronic pain: Secondary | ICD-10-CM

## 2024-04-24 DIAGNOSIS — M2550 Pain in unspecified joint: Secondary | ICD-10-CM

## 2024-04-24 DIAGNOSIS — M5416 Radiculopathy, lumbar region: Secondary | ICD-10-CM

## 2024-04-24 MED ORDER — PREGABALIN 75 MG PO CAPS: ORAL_CAPSULE | ORAL | 0 refills | Status: AC

## 2024-04-24 NOTE — Progress Notes (Unsigned)
 Pain Scale   Average Pain 4 Patient advising she still has pain in lower back radiating to left leg, injection gave no relief per patient . Patient asking to go back on Prednisone         +Driver, -BT, -Dye Allergies.

## 2024-04-24 NOTE — Progress Notes (Unsigned)
 Valerie Love - 68 y.o. female MRN [id number]  Date of birth: 02-Dec-1955  Office Visit Note: Visit Date: 04/24/2024 PCP: Ozell Heron HERO, MD Referred by: Ozell Heron HERO, MD  Subjective: Chief Complaint  Patient presents with   Lower Back - Follow-up   HPI: Valerie Love is a 68 y.o. female who comes in today for evaluation of chronic, worsening and severe bilateral lower back pain and pain radiating from posterior ankle up the leg to hip. States her pain can change, some days her pain radiates down left posterolateral leg. Pain ongoing since July. Her pain worsens with movement and activity. She describes pain as throbbing sensation, currently rates as 8 out of 10. Some relief of pain with home exercise regimen, rest and use of medications. History of formal physical therapy with minimal relief of pain. Lumbar MRI imaging from October of this year shows moderate facet hypertrophy at L5-S1, disc space narrowing, and small bulge, resulting in severe bilateral neural foraminal stenosis and narrowing of both lateral recesses without central spinal canal stenosis. No high grade spinal canal stenosis. She underwent bilateral S1 transforaminal epidural steroid injection in our office on 03/28/2024. Patient denies focal weakness, numbness and tingling. No recent trauma or falls.   She also reports diffuse body pain for 5 plus years. She also carries diagnosis of anxiety and depression.       Review of Systems  Musculoskeletal:  Positive for back pain.  Neurological:  Negative for tingling, sensory change, focal weakness and weakness.  All other systems reviewed and are negative.  Otherwise per HPI.  Assessment & Plan: Visit Diagnoses:    ICD-10-CM   1. Chronic bilateral low back pain with left-sided sciatica  G89.29    M54.42     2. Lumbar radiculopathy  M54.16     3. Arthralgia, unspecified joint  M25.50        Plan: Findings:  Chronic, worsening and severe  bilateral lower back pain and pain radiating from posterior ankle up the leg to hip. Patient continues to have severe pain despite good conservative therapies such as formal physical therapy, home exercise regimen, rest and use of medications. Patients clinical presentation and exam are consistent with lumbar radiculopathy, more of L5 and S1 dermatomes. There is moderate facet arthropathy, severe foraminal narrowing and lateral recess stenosis at L5-S1. We discussed treatment plan in detail today. Next step is to perform diagnostic and hopefully therapeutic bilateral L5 transforaminal epidural steroid injection under fluoroscopic guidance. I also discussed medication management and prescribed Lyrica  for her to try. She would like to continue with short course of Prednisone  as well, she can speak with Dr. Harden regarding this. I think it would be okay for short period of time while we are waiting on injection. She has no questions at this time. Her exam today is non focal, good strength noted to bilateral lower extremities.     Meds & Orders: No orders of the defined types were placed in this encounter.  No orders of the defined types were placed in this encounter.   Follow-up: Return for Left L5 transforaminal epidural steroid injection.   Procedures: No procedures performed      Clinical History: Narrative & Impression EXAM: MRI LUMBAR SPINE 02/26/2024 04:22:00 PM   TECHNIQUE: Multiplanar multisequence MRI of the lumbar spine was performed without the administration of intravenous contrast.   COMPARISON: None available.   CLINICAL HISTORY: Low back pain, symptoms persist with > 6 wks treatment. Degeneration of  intervertebral disc of lumbar region with discogenic back pain and lower extremity pain; No injury; No surgery; No cancer.   FINDINGS:   BONES AND ALIGNMENT: Grade 1 retrolisthesis at L1-L2 and L2-L3. Normal vertebral body heights. Bone marrow signal is unremarkable.    SPINAL CORD: The conus terminates normally.   SOFT TISSUES: No paraspinal mass.   T12-L1: Disc space narrowing and small bulge. No central spinal canal or neural foraminal stenosis.   L1-L2: Disc space narrowing with disc bulge and endplate spurring asymmetric to the left. Mild narrowing of the left lateral recess. No central spinal canal or neural foraminal stenosis.   L2-L3: Small disc bulge with endplate spurring. No central spinal canal or neural foraminal stenosis.   L3-L4: Right asymmetric disc bulge with endplate spurring. Mild right foraminal stenosis. No spinal canal stenosis.   L4-L5: Small disc bulge. No central spinal canal or neural foraminal stenosis.   L5-S1: Moderate facet hypertrophy, disc space narrowing, and small bulge. Severe bilateral neural foraminal stenosis. Narrowing of both lateral recesses without central spinal canal stenosis.   IMPRESSION: 1. L5-S1 moderate facet hypertrophy, disc space narrowing, and small bulge, resulting in severe bilateral neural foraminal stenosis and narrowing of both lateral recesses without central spinal canal stenosis. 2. L3-L4 right asymmetric disc bulge with endplate spurring, resulting in mild right foraminal stenosis. No spinal canal stenosis. 3. L1-L2 disc space narrowing with disc bulge and endplate spurring asymmetric to the left, resulting in mild narrowing of the left lateral recess. No central spinal canal or neural foraminal stenosis.   Electronically signed by: Franky Stanford MD 02/29/2024 03:02 AM EDT RP Workstation: [id number]   She reports that she quit smoking about 8 years ago. Her smoking use included cigarettes. She started smoking about 48 years ago. She has a 10 pack-year smoking history. She has never used smokeless tobacco.  Recent Labs    10/05/23 1416  HGBA1C 5.6    Objective:  VS:  HT:    WT:   BMI:     BP:   HR: bpm  TEMP: ( )  RESP:  Physical Exam Vitals and nursing note  reviewed.  HENT:     Head: Normocephalic and atraumatic.     Right Ear: External ear normal.     Left Ear: External ear normal.     Nose: Nose normal.     Mouth/Throat:     Mouth: Mucous membranes are moist.  Eyes:     Extraocular Movements: Extraocular movements intact.  Cardiovascular:     Rate and Rhythm: Normal rate.     Pulses: Normal pulses.  Pulmonary:     Effort: Pulmonary effort is normal.  Abdominal:     General: Abdomen is flat. There is no distension.  Musculoskeletal:        General: Tenderness present.     Cervical back: Normal range of motion.     Comments: Patient rises from seated position to standing without difficulty. Good lumbar range of motion. No pain noted with facet loading. 5/5 strength noted with bilateral hip flexion, knee flexion/extension, ankle dorsiflexion/plantarflexion and EHL. No clonus noted bilaterally. No pain upon palpation of greater trochanters. No pain with internal/external rotation of bilateral hips. Sensation intact bilaterally. Dysesthesias noted to left L5 and S1 dermatomes. Negative slump test bilaterally. Ambulates without aid, gait steady.     Skin:    General: Skin is warm and dry.     Capillary Refill: Capillary refill takes less than 2 seconds.  Neurological:  General: No focal deficit present.     Mental Status: She is alert and oriented to person, place, and time.  Psychiatric:        Mood and Affect: Mood normal.        Behavior: Behavior normal.     Ortho Exam  Imaging: No results found.  Past Medical/Family/Surgical/Social History: Medications & Allergies reviewed per EMR, new medications updated. Patient Active Problem List   Diagnosis Date Noted   Paroxysmal atrial fibrillation (HCC) 06/05/2023   Asthma in adult 10/25/2022   Hypercoagulable state due to paroxysmal atrial fibrillation (HCC) 11/16/2021   Atrial fibrillation with RVR (HCC) 11/10/2021   Atrial fibrillation with rapid ventricular response (HCC)  11/09/2021   Acute systolic heart failure (HCC) 11/01/2018   NSTEMI (non-ST elevated myocardial infarction) (HCC) 10/30/2018   Hyperlipidemia 06/13/2018   Lactose intolerance 06/13/2018   S/P total knee arthroplasty, right 03/16/2016   Unilateral primary osteoarthritis, right knee    Special screening for malignant neoplasms, colon 12/29/2014   Diarrhea 12/29/2014   Medication management 05/13/2013   Does not have health insurance 05/13/2013   Adjustment reaction with anxiety and depression 03/12/2012   Medication monitoring encounter 03/12/2012   Hemorrhoids 08/13/2011   Perineal itching, female 08/13/2011   LBBB (left bundle branch block) 09/24/2010   Murmur 09/08/2010   EKG, abnormal 08/19/2010   Visit for preventive health examination 08/19/2010   Palpitations 08/18/2010   Dizziness 08/18/2010   Arthritis 08/18/2010   Anxiety state 06/18/2008   GRIEF REACTION 06/18/2008   DEPRESSION, MAJOR, RECURRENT 07/06/2006   ARTHRALGIA, UNSPECIFIED 07/06/2006   Past Medical History:  Diagnosis Date   Anxiety    Arthritis    R knee, back, hands    Cellulitis of knee 04/2016   RT KNEE   Cellulitis of right knee 04/18/2016   Childhood asthma    Complication of anesthesia    woke up slowly - 71yrs. ago   Depression    related to husband illness and death   History of jaundice    as a child     Hx of varicella    Hyperthyroidism    during pregnancy, treated & resolved post partum    IBS (irritable bowel syndrome)    LBBB (left bundle branch block)    Scoliosis    Scoliosis 07/2015   Thyroid  disease in pregnancy    TOBACCO DEPENDENCE 07/06/2006   Qualifier: Diagnosis of  By: Manford Longs     Troponin level elevated    Family History  Problem Relation Age of Onset   Alcohol abuse Mother        died from stomach ulcers   Stroke Father        died  from cva and mi   Hypertension Father    Hyperlipidemia Father    Coronary artery disease Father        Age 65   Colon polyps  Sister    Hepatitis C Brother    Colon cancer Neg Hx    Past Surgical History:  Procedure Laterality Date   ATRIAL FIBRILLATION ABLATION N/A 08/11/2022   Procedure: ATRIAL FIBRILLATION ABLATION;  Surgeon: Inocencio Soyla Lunger, MD;  Location: [facility] INVASIVE CV LAB;  Service: Cardiovascular;  Laterality: N/A;   BREAST BIOPSY Right 1979   benign    Cyst removed from ovary     KNEE SURGERY     right duda    LEFT HEART CATH AND CORONARY ANGIOGRAPHY N/A 10/31/2018   Procedure: LEFT HEART  CATH AND CORONARY ANGIOGRAPHY;  Surgeon: Burnard Debby LABOR, MD;  Location: [facility] INVASIVE CV LAB;  Service: Cardiovascular;  Laterality: N/A;   TONSILLECTOMY AND ADENOIDECTOMY  1073   TOTAL KNEE ARTHROPLASTY 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   Procedure: TOTAL KNEE ARTHROPLASTY;  Surgeon: Jerona LULLA Sage, MD;  Location: [facility] OR;  Service: Orthopedics;  Laterality: Right;   TUBAL LIGATION     WISDOM TOOTH EXTRACTION     Social History   Occupational History   Occupation: self    Comment: Geneticist, molecular  Tobacco Use   Smoking status: Former    Current packs/day: 0.00    Average packs/day: 0.3 packs/day for 40.0 years (10.0 ttl pk-yrs)    Types: Cigarettes    Start date: 01/06/1976    Quit date: 01/06/2016    Years since quitting: 8.3   Smokeless tobacco: Never   Tobacco comments:    Former smoker 11/16/21  Substance and Sexual Activity   Alcohol use: Yes    Alcohol/week: 7.0 standard drinks of alcohol    Types: 7 Glasses of wine per week    Comment: 1 glass of wine daily 11/16/21   Drug use: No   Sexual activity: Not on file

## 2024-04-26 ENCOUNTER — Telehealth: Payer: Self-pay | Admitting: Cardiology

## 2024-04-26 NOTE — Telephone Encounter (Signed)
" °*  STAT* If patient is at the pharmacy, call can be transferred to refill team.   1. Which medications need to be refilled? (please list name of each medication and dose if known)   diltiazem  (CARDIZEM  CD) 180 MG 24 hr capsule    2. Which pharmacy/location (including street and city if local pharmacy) is medication to be sent to?  [facility] [address] [place], [place] - [address]    3. Do they need a 30 day or 90 day supply? 90  "

## 2024-04-29 MED ORDER — DILTIAZEM HCL ER COATED BEADS 180 MG PO CP24: 180.0000 mg | ORAL_CAPSULE | Freq: Every day | ORAL | 2 refills | Status: AC

## 2024-04-29 NOTE — Telephone Encounter (Signed)
 Requested Prescriptions   Signed Prescriptions Disp Refills   diltiazem  (CARDIZEM  CD) 180 MG 24 hr capsule 90 capsule 2    Sig: Take 1 capsule (180 mg total) by mouth daily.    Authorizing Provider: ANICETO DAPHNE CROME    Ordering User: WILFRED, Lynnex Fulp  C

## 2024-05-20 ENCOUNTER — Other Ambulatory Visit: Payer: Self-pay

## 2024-05-20 ENCOUNTER — Ambulatory Visit: Admitting: Physical Medicine and Rehabilitation

## 2024-05-20 VITALS — BP 130/86 | HR 65

## 2024-05-20 DIAGNOSIS — M5416 Radiculopathy, lumbar region: Secondary | ICD-10-CM | POA: Diagnosis not present

## 2024-05-20 MED ORDER — METHYLPREDNISOLONE ACETATE 40 MG/ML IJ SUSP
40.0000 mg | Freq: Once | INTRAMUSCULAR | Status: AC
  Administered 2024-05-20: 40 mg

## 2024-05-20 NOTE — Progress Notes (Signed)
 Pain Scale   Average Pain 3 Patient advising she has lower back pain radiating to bilateral legs at times and hip areas pain is constant        +Driver, -BT, -Dye Allergies.

## 2024-05-20 NOTE — Progress Notes (Signed)
 "  Valerie Love - 69 y.o. female MRN [id number]  Date of birth: [birth year]  Office Visit Note: Visit Date: 05/20/2024 PCP: Ozell Heron HERO, MD Referred by: Ozell Heron HERO, MD  Subjective: Chief Complaint  Patient presents with   Lower Back - Pain   HPI:  Valerie Love is a 69 y.o. female who comes in today at the request of Duwaine Pouch, FNP for planned Left L5-S1 Lumbar Transforaminal epidural steroid injection with fluoroscopic guidance.  The patient has failed conservative care including home exercise, medications, time and activity modification.  This injection will be diagnostic and hopefully therapeutic.  Please see requesting physician notes for further details and justification.   Interestingly, the patient did remind us  that she had essentially a coughing fit the entire time of the last injection.  We did have difficulty on the left side at S1 although the right sided injection looked well done.  She is having more symptoms L5 and S1.  Will try the L5 injection today although there is significant foraminal narrowing.   ROS Otherwise per HPI.  Assessment & Plan: Visit Diagnoses:    ICD-10-CM   1. Lumbar radiculopathy  M54.16 XR C-ARM NO REPORT    Epidural Steroid injection    methylPREDNISolone  acetate (DEPO-MEDROL ) injection 40 mg      Plan: No additional findings.   Meds & Orders:  Meds ordered this encounter  Medications   methylPREDNISolone  acetate (DEPO-MEDROL ) injection 40 mg    Orders Placed This Encounter  Procedures   XR C-ARM NO REPORT   Epidural Steroid injection    Follow-up: Return for visit to requesting provider as needed.   Procedures: No procedures performed  Lumbosacral Transforaminal Epidural Steroid Injection - Sub-Pedicular Approach with Fluoroscopic Guidance  Patient: Valerie Love      Date of Birth: 1955-12-07 MRN: [id number] PCP: Ozell Heron HERO, MD      Visit Date: 05/20/2024   Universal Protocol:     Date/Time: 05/20/2024  Consent Given By: the patient  Position: PRONE  Additional Comments: Vital signs were monitored before and after the procedure. Patient was prepped and draped in the usual sterile fashion. The correct patient, procedure, and site was verified.   Injection Procedure Details:   Procedure diagnoses: Lumbar radiculopathy [M54.16]    Meds Administered:  Meds ordered this encounter  Medications   methylPREDNISolone  acetate (DEPO-MEDROL ) injection 40 mg    Laterality: Left  Location/Site: L5  Needle:5.0 in., 22 ga.  Short bevel or Quincke spinal needle  Needle Placement: Transforaminal  Findings:    -Comments: Excellent flow of contrast along the nerve, nerve root and into the epidural space.  Procedure Details: After squaring off the end-plates to get a true AP view, the C-arm was positioned so that an oblique view of the foramen as noted above was visualized. The target area is just inferior to the nose of the scotty dog or sub pedicular. The soft tissues overlying this structure were infiltrated with 2-3 ml. of 1% Lidocaine  without Epinephrine.  The spinal needle was inserted toward the target using a trajectory view along the fluoroscope beam.  Under AP and lateral visualization, the needle was advanced so it did not puncture dura and was located close the 6 O'Clock position of the pedical in AP tracterory. Biplanar projections were used to confirm position. Aspiration was confirmed to be negative for CSF and/or blood. A 1-2 ml. volume of Isovue-250 was injected and flow of contrast was noted at each level.  Radiographs were obtained for documentation purposes.   After attaining the desired flow of contrast documented above, a 0.5 to 1.0 ml test dose of 0.25% Marcaine  was injected into each respective transforaminal space.  The patient was observed for 90 seconds post injection.  After no sensory deficits were reported, and normal lower extremity motor  function was noted,   the above injectate was administered so that equal amounts of the injectate were placed at each foramen (level) into the transforaminal epidural space.   Additional Comments:  The patient tolerated the procedure well Dressing: 2 x 2 sterile gauze and Band-Aid    Post-procedure details: Patient was observed during the procedure. Post-procedure instructions were reviewed.  Patient left the clinic in stable condition.    Clinical History: Narrative & Impression EXAM: MRI LUMBAR SPINE 02/26/2024 04:22:00 PM   TECHNIQUE: Multiplanar multisequence MRI of the lumbar spine was performed without the administration of intravenous contrast.   COMPARISON: None available.   CLINICAL HISTORY: Low back pain, symptoms persist with > 6 wks treatment. Degeneration of intervertebral disc of lumbar region with discogenic back pain and lower extremity pain; No injury; No surgery; No cancer.   FINDINGS:   BONES AND ALIGNMENT: Grade 1 retrolisthesis at L1-L2 and L2-L3. Normal vertebral body heights. Bone marrow signal is unremarkable.   SPINAL CORD: The conus terminates normally.   SOFT TISSUES: No paraspinal mass.   T12-L1: Disc space narrowing and small bulge. No central spinal canal or neural foraminal stenosis.   L1-L2: Disc space narrowing with disc bulge and endplate spurring asymmetric to the left. Mild narrowing of the left lateral recess. No central spinal canal or neural foraminal stenosis.   L2-L3: Small disc bulge with endplate spurring. No central spinal canal or neural foraminal stenosis.   L3-L4: Right asymmetric disc bulge with endplate spurring. Mild right foraminal stenosis. No spinal canal stenosis.   L4-L5: Small disc bulge. No central spinal canal or neural foraminal stenosis.   L5-S1: Moderate facet hypertrophy, disc space narrowing, and small bulge. Severe bilateral neural foraminal stenosis. Narrowing of both lateral recesses  without central spinal canal stenosis.   IMPRESSION: 1. L5-S1 moderate facet hypertrophy, disc space narrowing, and small bulge, resulting in severe bilateral neural foraminal stenosis and narrowing of both lateral recesses without central spinal canal stenosis. 2. L3-L4 right asymmetric disc bulge with endplate spurring, resulting in mild right foraminal stenosis. No spinal canal stenosis. 3. L1-L2 disc space narrowing with disc bulge and endplate spurring asymmetric to the left, resulting in mild narrowing of the left lateral recess. No central spinal canal or neural foraminal stenosis.   Electronically signed by: Franky Stanford MD 02/29/2024 03:02 AM EDT RP Workstation: [id number]     Objective:  VS:  HT:    WT:   BMI:     BP:130/86  HR:65bpm  TEMP: ( )  RESP:  Physical Exam Vitals and nursing note reviewed.  Constitutional:      General: She is not in acute distress.    Appearance: Normal appearance. She is not ill-appearing.  HENT:     Head: Normocephalic and atraumatic.     Right Ear: External ear normal.     Left Ear: External ear normal.  Eyes:     Extraocular Movements: Extraocular movements intact.  Cardiovascular:     Rate and Rhythm: Normal rate.     Pulses: Normal pulses.  Pulmonary:     Effort: Pulmonary effort is normal. No respiratory distress.  Abdominal:  General: There is no distension.     Palpations: Abdomen is soft.  Musculoskeletal:        General: Tenderness present.     Cervical back: Neck supple.     Right lower leg: No edema.     Left lower leg: No edema.     Comments: Patient has good distal strength with no pain over the greater trochanters.  No clonus or focal weakness.  Skin:    Findings: No erythema, lesion or rash.  Neurological:     General: No focal deficit present.     Mental Status: She is alert and oriented to person, place, and time.     Cranial Nerves: No cranial nerve deficit.     Sensory: No sensory deficit.      Motor: No weakness or abnormal muscle tone.     Coordination: Coordination normal.     Gait: Gait normal.  Psychiatric:        Mood and Affect: Mood normal.        Behavior: Behavior normal.      Imaging: No results found. "

## 2024-05-20 NOTE — Procedures (Signed)
 Lumbosacral Transforaminal Epidural Steroid Injection - Sub-Pedicular Approach with Fluoroscopic Guidance  Patient: Valerie Love      Date of Birth: [birth year] MRN: [id number] PCP: Ozell Heron HERO, MD      Visit Date: 05/20/2024   Universal Protocol:    Date/Time: 05/20/2024  Consent Given By: the patient  Position: PRONE  Additional Comments: Vital signs were monitored before and after the procedure. Patient was prepped and draped in the usual sterile fashion. The correct patient, procedure, and site was verified.   Injection Procedure Details:   Procedure diagnoses: Lumbar radiculopathy [M54.16]    Meds Administered:  Meds ordered this encounter  Medications   methylPREDNISolone  acetate (DEPO-MEDROL ) injection 40 mg    Laterality: Left  Location/Site: L5  Needle:5.0 in., 22 ga.  Short bevel or Quincke spinal needle  Needle Placement: Transforaminal  Findings:    -Comments: Excellent flow of contrast along the nerve, nerve root and into the epidural space.  Procedure Details: After squaring off the end-plates to get a true AP view, the C-arm was positioned so that an oblique view of the foramen as noted above was visualized. The target area is just inferior to the nose of the scotty dog or sub pedicular. The soft tissues overlying this structure were infiltrated with 2-3 ml. of 1% Lidocaine  without Epinephrine.  The spinal needle was inserted toward the target using a trajectory view along the fluoroscope beam.  Under AP and lateral visualization, the needle was advanced so it did not puncture dura and was located close the 6 O'Clock position of the pedical in AP tracterory. Biplanar projections were used to confirm position. Aspiration was confirmed to be negative for CSF and/or blood. A 1-2 ml. volume of Isovue-250 was injected and flow of contrast was noted at each level. Radiographs were obtained for documentation purposes.   After attaining the desired  flow of contrast documented above, a 0.5 to 1.0 ml test dose of 0.25% Marcaine  was injected into each respective transforaminal space.  The patient was observed for 90 seconds post injection.  After no sensory deficits were reported, and normal lower extremity motor function was noted,   the above injectate was administered so that equal amounts of the injectate were placed at each foramen (level) into the transforaminal epidural space.   Additional Comments:  The patient tolerated the procedure well Dressing: 2 x 2 sterile gauze and Band-Aid    Post-procedure details: Patient was observed during the procedure. Post-procedure instructions were reviewed.  Patient left the clinic in stable condition.

## 2024-06-10 ENCOUNTER — Other Ambulatory Visit (HOSPITAL_BASED_OUTPATIENT_CLINIC_OR_DEPARTMENT_OTHER)

## 2024-06-11 ENCOUNTER — Ambulatory Visit: Admitting: Physician Assistant

## 2024-09-27 ENCOUNTER — Ambulatory Visit
